# Patient Record
Sex: Female | Born: 1960 | Race: White | Hispanic: No | Smoking: Current every day smoker
Health system: Southern US, Community
[De-identification: ages and names within clinical notes are randomized; demographics above are authoritative.]

## PROBLEM LIST (undated history)

## (undated) DIAGNOSIS — F633 Trichotillomania: Secondary | ICD-10-CM

## (undated) DIAGNOSIS — E119 Type 2 diabetes mellitus without complications: Secondary | ICD-10-CM

## (undated) DIAGNOSIS — J189 Pneumonia, unspecified organism: Secondary | ICD-10-CM

## (undated) DIAGNOSIS — M1990 Unspecified osteoarthritis, unspecified site: Secondary | ICD-10-CM

## (undated) HISTORY — PX: HIP OPEN REDUCTION: SHX1755

## (undated) HISTORY — PX: ORIF WRIST FRACTURE: SHX2133

## (undated) HISTORY — PX: APPENDECTOMY: SHX54

## (undated) HISTORY — PX: KNEE ARTHROSCOPY: SUR90

## (undated) HISTORY — PX: HAND SURGERY: SHX662

---
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
## 1995-12-25 HISTORY — PX: TUBAL LIGATION: SHX77

## 1997-08-15 ENCOUNTER — Other Ambulatory Visit: Admission: RE | Admit: 1997-08-15 | Discharge: 1997-08-15 | Payer: Self-pay | Admitting: Obstetrics & Gynecology

## 1998-09-20 ENCOUNTER — Observation Stay (HOSPITAL_COMMUNITY): Admission: AD | Admit: 1998-09-20 | Discharge: 1998-09-21 | Payer: Self-pay | Admitting: Obstetrics and Gynecology

## 1998-10-10 ENCOUNTER — Other Ambulatory Visit: Admission: RE | Admit: 1998-10-10 | Discharge: 1998-10-10 | Payer: Self-pay | Admitting: Obstetrics & Gynecology

## 1999-10-18 ENCOUNTER — Other Ambulatory Visit: Admission: RE | Admit: 1999-10-18 | Discharge: 1999-10-18 | Payer: Self-pay | Admitting: Obstetrics & Gynecology

## 2000-11-04 ENCOUNTER — Other Ambulatory Visit: Admission: RE | Admit: 2000-11-04 | Discharge: 2000-11-04 | Payer: Self-pay | Admitting: Obstetrics & Gynecology

## 2001-09-24 ENCOUNTER — Encounter: Payer: Self-pay | Admitting: Obstetrics & Gynecology

## 2001-09-24 ENCOUNTER — Encounter: Admission: RE | Admit: 2001-09-24 | Discharge: 2001-09-24 | Payer: Self-pay | Admitting: Obstetrics & Gynecology

## 2001-11-16 ENCOUNTER — Other Ambulatory Visit: Admission: RE | Admit: 2001-11-16 | Discharge: 2001-11-16 | Payer: Self-pay | Admitting: Obstetrics & Gynecology

## 2002-11-04 ENCOUNTER — Other Ambulatory Visit: Admission: RE | Admit: 2002-11-04 | Discharge: 2002-11-04 | Payer: Self-pay | Admitting: Obstetrics & Gynecology

## 2003-12-04 ENCOUNTER — Other Ambulatory Visit: Admission: RE | Admit: 2003-12-04 | Discharge: 2003-12-04 | Payer: Self-pay | Admitting: Obstetrics & Gynecology

## 2005-05-14 ENCOUNTER — Other Ambulatory Visit: Admission: RE | Admit: 2005-05-14 | Discharge: 2005-05-14 | Payer: Self-pay | Admitting: Obstetrics & Gynecology

## 2014-04-14 ENCOUNTER — Ambulatory Visit: Payer: Self-pay | Admitting: Orthopedic Surgery

## 2014-04-24 ENCOUNTER — Ambulatory Visit: Payer: Self-pay | Admitting: Orthopedic Surgery

## 2014-04-24 NOTE — H&P (Signed)
[name] is an 53 y.o. female.   Chief Complaint: back and left leg pain HPI: The patient is a 53 year old female who presents today for follow up of their back. The patient is being followed for their low back and left hip pain. They are now 6 month(s) out from a flare up. Symptoms reported today include: pain. The patient states that they are doing poorly. Current treatment includes: activity modification, NSAIDs and pain medications. The following medication has been used for pain control: Noroco, Neurontin and Aleve. The patient presents today following MRI. The patient indicates that they have questions or concerns today regarding pain. The patient reports she fell yesteday and now has increased back pain and ankle pain.  No past medical history on file.  No past surgical history on file.  No family history on file. Social History:  has no tobacco, alcohol, and drug history on file.  Allergies: Allergies not on file   (Not in a hospital admission)  No results found for this or any previous visit (from the past 48 hour(s)). No results found.  Review of Systems  Constitutional: Negative.   HENT: Negative.   Eyes: Negative.   Respiratory: Negative.   Cardiovascular: Negative.   Gastrointestinal: Negative.   Genitourinary: Negative.   Musculoskeletal: Positive for back pain and joint pain.  Skin: Negative.   Neurological: Positive for focal weakness.    There were no vitals taken for this visit. Physical Exam  Constitutional: She is oriented to person, place, and time. She appears well-developed and well-nourished.  HENT:  Head: Normocephalic and atraumatic.  Eyes: Conjunctivae and EOM are normal. Pupils are equal, round, and reactive to light.  Neck: Normal range of motion. Neck supple.  Cardiovascular: Normal rate and regular rhythm.   Respiratory: Effort normal and breath sounds normal.  GI: Soft. Bowel sounds are normal.  Musculoskeletal:  On exam straight leg  raise produces buttock, thigh, and calf pain on the left, negative on the right. Trace EHL weakness. Pain with extension and flexion of the lumbar spine.  Lumbar spine exam reveals no evidence of soft tissue swelling, no evidence of soft tissue swelling or deformity or skin ecchymosis. On palpation there is no tenderness of the lumbar spine. No flank pain with percussion. The abdomen is soft and nontender. Nontender over the trochanters. No cellulitis or lymphadenopathy.  Motor is 5/5 including tibialis anterior, plantarflexion, quadriceps and hamstrings. The patient is normoreflexic. There is no Babinski or clonus. Sensory exam is intact to light touch. The patient has good distal pulses. No DVT. No pain and normal range of motion without instability of the hips, and knees.  She was recently walking and twisted her ankle. She has tenderness over the anterior lateral talofibular ligament. Negative anterior drawer.  Neurological: She is alert and oriented to person, place, and time. She has normal reflexes.  Skin: Skin is warm and dry.     She does have a disc herniation L5-S1 with root displaced. A small disc protrusion at 4-5. It doe radiate into the outer aspect of the foot, the S1 dermatome.  Assessment/Plan HNP L5-S1 left 1. 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 2. Grade II ankle stain.  Plan 1. Rest, ice, and elevation for the ankle, ASO. 2. For her lumbar spine we discussed lumbar decompression versus epidural. She is diabetic and is better controlled although she had injections in the hip and it elevated her blood glucose. 3. We  discussed selective nerve root block, epidural at 5-1 versus lumbar decompression.  I had an extensive discussion of the risks and benefits of lumbar decompression with the patient including bleeding, infection, damage to neurovascular structures, epidural fibrosis, CSF leakage  requiring repair. We also discussed increase in pain, adjacent segment disease, recurrent disc herniation, need for future surgery including repeat decompression and/or fusion. We also discussed risks of postoperative hematoma, paralysis, anesthetic complications including DVT, PE, death, cardiopulmonary dysfunction. In addition, the perioperative and postoperative courses were discussed in detail including the rehabilitative time and return to functional activity and work. I provided the patient with an illustrated handout and utilized the appropriate surgical models.  4. She would like to contemplate that at this point in time. The patient called back later that day. She wants to forego the injection and proceed with lumbar decompression. We will call her and discuss the lumbar decompression.  She had called back and talked it over with her mother and is now declining the epidural injection due to diabetes and probability of temporary relief. We did discuss lumbar decompression and the possibility of CSF leakage, recurrent disc herniation and needing fusion in vitro overnight postoperative course, and time for recovery of the nerve. However talked to [place] and will schedule accordingly.  I had an extensive discussion of the risks and benefits of the lumbar decompression with the patient including bleeding, infection, damage to neurovascular structures, epidural fibrosis, CSF leak requiring repair. We also discussed increase in pain, adjacent segment disease, recurrent disc herniation, need for future surgery including repeat decompression and/or fusion. We also discussed risks of postoperative hematoma, paralysis, anesthetic complications including DVT, PE, death, cardiopulmonary dysfunction. In addition, the perioperative and postoperative courses were discussed in detail including the rehabilitative time and return to functional activity and work. I provided the patient with an illustrated handout  and utilized the appropriate surgical models.  Plan microlumbar decompression L5-S1 left  [name], [name] PA-C for Dr. Tonita Cong 04/24/2014, 9:24 AM

## 2014-04-28 HISTORY — PX: BACK SURGERY: SHX140

## 2014-05-04 ENCOUNTER — Encounter (HOSPITAL_COMMUNITY): Payer: Self-pay

## 2014-05-04 ENCOUNTER — Ambulatory Visit (HOSPITAL_COMMUNITY)
Admission: RE | Admit: 2014-05-04 | Discharge: 2014-05-04 | Disposition: A | Discharge status: Home / Self Care | Payer: BC Managed Care – PPO | Source: Ambulatory Visit | Attending: Orthopedic Surgery | Admitting: Orthopedic Surgery

## 2014-05-04 ENCOUNTER — Encounter (HOSPITAL_COMMUNITY)
Admission: RE | Admit: 2014-05-04 | Discharge: 2014-05-04 | Disposition: A | Discharge status: Home / Self Care | Payer: BC Managed Care – PPO | Source: Ambulatory Visit | Attending: Specialist | Admitting: Specialist

## 2014-05-04 DIAGNOSIS — Z01818 Encounter for other preprocedural examination: Secondary | ICD-10-CM | POA: Diagnosis not present

## 2014-05-04 DIAGNOSIS — M5136 Other intervertebral disc degeneration, lumbar region: Secondary | ICD-10-CM | POA: Insufficient documentation

## 2014-05-04 DIAGNOSIS — E119 Type 2 diabetes mellitus without complications: Secondary | ICD-10-CM | POA: Diagnosis not present

## 2014-05-04 DIAGNOSIS — M5126 Other intervertebral disc displacement, lumbar region: Secondary | ICD-10-CM

## 2014-05-04 HISTORY — DX: Type 2 diabetes mellitus without complications: E11.9

## 2014-05-04 HISTORY — DX: Trichotillomania: F63.3

## 2014-05-04 HISTORY — DX: Unspecified osteoarthritis, unspecified site: M19.90

## 2014-05-04 LAB — BASIC METABOLIC PANEL
Anion gap: 3 — ABNORMAL LOW (ref 5–15)
BUN: 12 mg/dL (ref 6–23)
CO2: 27 mmol/L (ref 19–32)
CREATININE: 0.59 mg/dL (ref 0.50–1.10)
Calcium: 9 mg/dL (ref 8.4–10.5)
Chloride: 106 mEq/L (ref 96–112)
Glucose, Bld: 126 mg/dL — ABNORMAL HIGH (ref 70–99)
POTASSIUM: 3.6 mmol/L (ref 3.5–5.1)
Sodium: 136 mmol/L (ref 135–145)

## 2014-05-04 LAB — SURGICAL PCR SCREEN
MRSA, PCR: NEGATIVE
Staphylococcus aureus: NEGATIVE

## 2014-05-04 LAB — CBC
HEMATOCRIT: 43.4 % (ref 36.0–46.0)
HEMOGLOBIN: 14.3 g/dL (ref 12.0–15.0)
MCH: 32 pg (ref 26.0–34.0)
MCHC: 32.9 g/dL (ref 30.0–36.0)
MCV: 97.1 fL (ref 78.0–100.0)
PLATELETS: 205 10*3/uL (ref 150–400)
RBC: 4.47 MIL/uL (ref 3.87–5.11)
RDW: 13.4 % (ref 11.5–15.5)
WBC: 7.1 10*3/uL (ref 4.0–10.5)

## 2014-05-04 NOTE — Pre-Procedure Instructions (Addendum)
05-04-14 EKG done. Lumbar spine done per order. Dr. Delma Post, anesthesiologist  in to see to discuss pt concerns on glucose control during surgery. Phentermine use last 05-02-14- pt. Aware to not take for now.

## 2014-05-04 NOTE — Pre-Procedure Instructions (Signed)
Ms. [name] takes Toujeo in the morning. I discussed management on the day of surgery. She will take one half her usual AM dose on the day of surgery.

## 2014-05-04 NOTE — Patient Instructions (Addendum)
[place]  05/04/2014   Your procedure is scheduled on:   -05-10-2014  Enter through [facility]  Entrance and follow signs to [facility]. Arrive at     0630   AM .  Call this number if you have problems the morning of surgery: [phone]  Or Presurgical Testing [phone].   For Living Will and/or Health Care Power Attorney Forms: please provide copy for your medical record,may bring AM of surgery(Forms should be already notarized -we do not provide this service).(05-04-14 Yes/ No information preferred today).      Do not eat food/ or drink: After Midnight.      Take these medicines the morning of surgery with A SIP OF WATER: Take Toujeo insulin (1/2 usual AM dose) morning of and no other Diabetic meds.  Take usual PM meds night before.   Do not wear jewelry, make-up or nail polish.  Do not wear deodorant, lotions, powders, or perfumes.   Do not shave legs and under arms- 48 hours(2 days) prior to first CHG shower.(Shaving face and neck okay.)  Do not bring valuables to the hospital.(Hospital is not responsible for lost valuables).  Contacts, dentures or removable bridgework, body piercing, hair pins may not be worn into surgery.  Leave suitcase in the car. After surgery it may be brought to your room.  For patients admitted to the hospital, checkout time is 11:00 AM the day of discharge.(Restricted visitors-Any Persons displaying flu-like symptoms or illness).    Patients discharged the day of surgery will not be allowed to drive home. Must have responsible person with you x 24 hours once discharged.  Name and phone number of your driver: [name] , mother [phone]     Please read over the following fact sheets that you were given:  CHG(Chlorhexidine Gluconate 4% Surgical Soap) use, MRSA Information,  Incentive Spirometry Instruction.           [name] - Preparing for Surgery Before surgery, you can play an important role.  Because skin is not sterile,  your skin needs to be as free of germs as possible.  You can reduce the number of germs on your skin by washing with CHG (chlorahexidine gluconate) soap before surgery.  CHG is an antiseptic cleaner which kills germs and bonds with the skin to continue killing germs even after washing. Please DO NOT use if you have an allergy to CHG or antibacterial soaps.  If your skin becomes reddened/irritated stop using the CHG and inform your nurse when you arrive at Short Stay. Do not shave (including legs and underarms) for at least 48 hours prior to the first CHG shower.  You may shave your face/neck. Please follow these instructions carefully:  1.  Shower with CHG Soap the night before surgery and the  morning of Surgery.  2.  If you choose to wash your hair, wash your hair first as usual with your  normal  shampoo.  3.  After you shampoo, rinse your hair and body thoroughly to remove the  shampoo.                           4.  Use CHG as you would any other liquid soap.  You can apply chg directly  to the skin and wash                       Gently with a  scrungie or clean washcloth.  5.  Apply the CHG Soap to your body ONLY FROM THE NECK DOWN.   Do not use on face/ open                           Wound or open sores. Avoid contact with eyes, ears mouth and genitals (private parts).                       Wash face,  Genitals (private parts) with your normal soap.             6.  Wash thoroughly, paying special attention to the area where your surgery  will be performed.  7.  Thoroughly rinse your body with warm water from the neck down.  8.  DO NOT shower/wash with your normal soap after using and rinsing off  the CHG Soap.                9.  Pat yourself dry with a clean towel.            10.  Wear clean pajamas.            11.  Place clean sheets on your bed the night of your first shower and do not  sleep with pets. Day of Surgery : Do not apply any lotions/deodorants the morning of surgery.  Please wear  clean clothes to the hospital/surgery center.  FAILURE TO FOLLOW THESE INSTRUCTIONS MAY RESULT IN THE CANCELLATION OF YOUR SURGERY PATIENT SIGNATURE_________________________________  NURSE SIGNATURE__________________________________  ________________________________________________________________________   [name]  An incentive spirometer is a tool that can help keep your lungs clear and active. This tool measures how well you are filling your lungs with each breath. Taking long deep breaths may help reverse or decrease the chance of developing breathing (pulmonary) problems (especially infection) following:  A long period of time when you are unable to move or be active. BEFORE THE PROCEDURE   If the spirometer includes an indicator to show your best effort, your nurse or respiratory therapist will set it to a desired goal.  If possible, sit up straight or lean slightly forward. Try not to slouch.  Hold the incentive spirometer in an upright position. INSTRUCTIONS FOR USE  1. Sit on the edge of your bed if possible, or sit up as far as you can in bed or on a chair. 2. Hold the incentive spirometer in an upright position. 3. Breathe out normally. 4. Place the mouthpiece in your mouth and seal your lips tightly around it. 5. Breathe in slowly and as deeply as possible, raising the piston or the ball toward the top of the column. 6. Hold your breath for 3-5 seconds or for as long as possible. Allow the piston or ball to fall to the bottom of the column. 7. Remove the mouthpiece from your mouth and breathe out normally. 8. Rest for a few seconds and repeat Steps 1 through 7 at least 10 times every 1-2 hours when you are awake. Take your time and take a few normal breaths between deep breaths. 9. The spirometer may include an indicator to show your best effort. Use the indicator as a goal to work toward during each repetition. 10. After each set of 10 deep breaths, practice  coughing to be sure your lungs are clear. If you have an incision (the cut made at the time of surgery), support your  incision when coughing by placing a pillow or rolled up towels firmly against it. Once you are able to get out of bed, walk around indoors and cough well. You may stop using the incentive spirometer when instructed by your caregiver.  RISKS AND COMPLICATIONS  Take your time so you do not get dizzy or light-headed.  If you are in pain, you may need to take or ask for pain medication before doing incentive spirometry. It is harder to take a deep breath if you are having pain. AFTER USE  Rest and breathe slowly and easily.  It can be helpful to keep track of a log of your progress. Your caregiver can provide you with a simple table to help with this. If you are using the spirometer at home, follow these instructions: [place] IF:   You are having difficultly using the spirometer.  You have trouble using the spirometer as often as instructed.  Your pain medication is not giving enough relief while using the spirometer.  You develop fever of 100.5 F (38.1 C) or higher. SEEK IMMEDIATE MEDICAL CARE IF:   You cough up bloody sputum that had not been present before.  You develop fever of 102 F (38.9 C) or greater.  You develop worsening pain at or near the incision site. MAKE SURE YOU:   Understand these instructions.  Will watch your condition.  Will get help right away if you are not doing well or get worse. Document Released: 08/25/2006 Document Revised: 07/07/2011 Document Reviewed: 10/26/2006 [facility] Patient Information 2014 [place], [place].   ________________________________________________________________________

## 2014-05-10 ENCOUNTER — Ambulatory Visit (HOSPITAL_COMMUNITY)
Admission: RE | Admit: 2014-05-10 | Discharge: 2014-05-10 | Disposition: A | Discharge status: Home / Self Care | Payer: BC Managed Care – PPO | Source: Ambulatory Visit | Attending: Specialist | Admitting: Specialist

## 2014-05-10 ENCOUNTER — Ambulatory Visit (HOSPITAL_COMMUNITY): Payer: BC Managed Care – PPO

## 2014-05-10 ENCOUNTER — Ambulatory Visit (HOSPITAL_COMMUNITY): Payer: BC Managed Care – PPO | Admitting: Anesthesiology

## 2014-05-10 ENCOUNTER — Encounter (HOSPITAL_COMMUNITY)
Admission: RE | Disposition: A | Discharge status: Home / Self Care | Payer: Self-pay | Source: Ambulatory Visit | Attending: Specialist

## 2014-05-10 ENCOUNTER — Encounter (HOSPITAL_COMMUNITY): Payer: Self-pay | Admitting: *Deleted

## 2014-05-10 DIAGNOSIS — Z794 Long term (current) use of insulin: Secondary | ICD-10-CM | POA: Insufficient documentation

## 2014-05-10 DIAGNOSIS — Z419 Encounter for procedure for purposes other than remedying health state, unspecified: Secondary | ICD-10-CM

## 2014-05-10 DIAGNOSIS — M5126 Other intervertebral disc displacement, lumbar region: Secondary | ICD-10-CM

## 2014-05-10 DIAGNOSIS — E119 Type 2 diabetes mellitus without complications: Secondary | ICD-10-CM | POA: Diagnosis not present

## 2014-05-10 DIAGNOSIS — M4690 Unspecified inflammatory spondylopathy, site unspecified: Secondary | ICD-10-CM | POA: Insufficient documentation

## 2014-05-10 DIAGNOSIS — Z888 Allergy status to other drugs, medicaments and biological substances status: Secondary | ICD-10-CM | POA: Insufficient documentation

## 2014-05-10 DIAGNOSIS — F17210 Nicotine dependence, cigarettes, uncomplicated: Secondary | ICD-10-CM | POA: Diagnosis not present

## 2014-05-10 HISTORY — PX: LUMBAR LAMINECTOMY/DECOMPRESSION MICRODISCECTOMY: SHX5026

## 2014-05-10 HISTORY — DX: Other intervertebral disc displacement, lumbar region: M51.26

## 2014-05-10 LAB — GLUCOSE, CAPILLARY
Glucose-Capillary: 111 mg/dL — ABNORMAL HIGH (ref 70–99)
Glucose-Capillary: 140 mg/dL — ABNORMAL HIGH (ref 70–99)
Glucose-Capillary: 198 mg/dL — ABNORMAL HIGH (ref 70–99)

## 2014-05-10 SURGERY — LUMBAR LAMINECTOMY/DECOMPRESSION MICRODISCECTOMY
Anesthesia: General | Site: Back | Laterality: Left

## 2014-05-10 MED ORDER — MENTHOL 3 MG MT LOZG: 1.0000 | LOZENGE | OROMUCOSAL | Status: DC | PRN

## 2014-05-10 MED ORDER — METHOCARBAMOL 500 MG PO TABS: 500.0000 mg | ORAL_TABLET | Freq: Three times a day (TID) | ORAL | Status: DC

## 2014-05-10 MED ORDER — MIDAZOLAM HCL 2 MG/2ML IJ SOLN
INTRAMUSCULAR | Status: AC
  Filled 2014-05-10: qty 2

## 2014-05-10 MED ORDER — ALUM & MAG HYDROXIDE-SIMETH 200-200-20 MG/5ML PO SUSP: 30.0000 mL | Freq: Four times a day (QID) | ORAL | Status: DC | PRN

## 2014-05-10 MED ORDER — INSULIN GLARGINE 100 UNIT/ML ~~LOC~~ SOLN: 30.0000 [IU] | Freq: Every morning | SUBCUTANEOUS | Status: DC

## 2014-05-10 MED ORDER — PROPOFOL 10 MG/ML IV BOLUS
INTRAVENOUS | Status: DC | PRN
  Administered 2014-05-10: 90 mg via INTRAVENOUS

## 2014-05-10 MED ORDER — BUPIVACAINE-EPINEPHRINE (PF) 0.5% -1:200000 IJ SOLN
INTRAMUSCULAR | Status: AC
  Filled 2014-05-10: qty 30

## 2014-05-10 MED ORDER — CEFAZOLIN SODIUM-DEXTROSE 2-3 GM-% IV SOLR
INTRAVENOUS | Status: AC
  Filled 2014-05-10: qty 50

## 2014-05-10 MED ORDER — SENNOSIDES-DOCUSATE SODIUM 8.6-50 MG PO TABS: 1.0000 | ORAL_TABLET | Freq: Every evening | ORAL | Status: DC | PRN

## 2014-05-10 MED ORDER — GLYCOPYRROLATE 0.2 MG/ML IJ SOLN
INTRAMUSCULAR | Status: DC | PRN
  Administered 2014-05-10: 0.4 mg via INTRAVENOUS

## 2014-05-10 MED ORDER — PHENYLEPHRINE HCL 10 MG/ML IJ SOLN
10.0000 mg | INTRAMUSCULAR | Status: DC | PRN
  Administered 2014-05-10: 25 ug/min via INTRAVENOUS

## 2014-05-10 MED ORDER — BUPIVACAINE-EPINEPHRINE 0.5% -1:200000 IJ SOLN
INTRAMUSCULAR | Status: DC | PRN
  Administered 2014-05-10: 8 mL

## 2014-05-10 MED ORDER — KCL IN DEXTROSE-NACL 20-5-0.45 MEQ/L-%-% IV SOLN
INTRAVENOUS | Status: DC
  Filled 2014-05-10: qty 1000

## 2014-05-10 MED ORDER — DEXAMETHASONE SODIUM PHOSPHATE 10 MG/ML IJ SOLN
INTRAMUSCULAR | Status: AC
  Filled 2014-05-10: qty 1

## 2014-05-10 MED ORDER — ONDANSETRON HCL 4 MG/2ML IJ SOLN
INTRAMUSCULAR | Status: DC | PRN
  Administered 2014-05-10: 4 mg via INTRAVENOUS

## 2014-05-10 MED ORDER — PROMETHAZINE HCL 25 MG/ML IJ SOLN: 6.2500 mg | INTRAMUSCULAR | Status: DC | PRN

## 2014-05-10 MED ORDER — OXYCODONE-ACETAMINOPHEN 5-325 MG PO TABS: 1.0000 | ORAL_TABLET | ORAL | Status: DC | PRN

## 2014-05-10 MED ORDER — LIDOCAINE HCL (CARDIAC) 20 MG/ML IV SOLN
INTRAVENOUS | Status: AC
  Filled 2014-05-10: qty 5

## 2014-05-10 MED ORDER — CEFAZOLIN SODIUM-DEXTROSE 2-3 GM-% IV SOLR
2.0000 g | INTRAVENOUS | Status: AC
  Administered 2014-05-10: 2 g via INTRAVENOUS

## 2014-05-10 MED ORDER — SENNA 8.6 MG PO TABS: 1.0000 | ORAL_TABLET | Freq: Every day | ORAL | Status: DC

## 2014-05-10 MED ORDER — HYDROCODONE-ACETAMINOPHEN 5-325 MG PO TABS: 1.0000 | ORAL_TABLET | ORAL | Status: DC | PRN

## 2014-05-10 MED ORDER — DOCUSATE SODIUM 100 MG PO CAPS: 100.0000 mg | ORAL_CAPSULE | Freq: Two times a day (BID) | ORAL | Status: DC

## 2014-05-10 MED ORDER — ROCURONIUM BROMIDE 100 MG/10ML IV SOLN
INTRAVENOUS | Status: AC
  Filled 2014-05-10: qty 1

## 2014-05-10 MED ORDER — ESTRADIOL 1 MG PO TABS
1.0000 mg | ORAL_TABLET | Freq: Every day | ORAL | Status: DC
  Filled 2014-05-10: qty 1

## 2014-05-10 MED ORDER — SUCCINYLCHOLINE CHLORIDE 20 MG/ML IJ SOLN
INTRAMUSCULAR | Status: DC | PRN
  Administered 2014-05-10: 100 mg via INTRAVENOUS

## 2014-05-10 MED ORDER — ONDANSETRON HCL 4 MG/2ML IJ SOLN
INTRAMUSCULAR | Status: AC
  Filled 2014-05-10: qty 2

## 2014-05-10 MED ORDER — NEOSTIGMINE METHYLSULFATE 10 MG/10ML IV SOLN
INTRAVENOUS | Status: DC | PRN
  Administered 2014-05-10: 3 mg via INTRAVENOUS

## 2014-05-10 MED ORDER — METHOCARBAMOL 1000 MG/10ML IJ SOLN
500.0000 mg | Freq: Four times a day (QID) | INTRAMUSCULAR | Status: DC | PRN
  Filled 2014-05-10: qty 5

## 2014-05-10 MED ORDER — HYDROMORPHONE HCL 1 MG/ML IJ SOLN: 0.5000 mg | INTRAMUSCULAR | Status: DC | PRN

## 2014-05-10 MED ORDER — INSULIN ASPART 100 UNIT/ML ~~LOC~~ SOLN: 3.0000 [IU] | Freq: Three times a day (TID) | SUBCUTANEOUS | Status: DC

## 2014-05-10 MED ORDER — MIDAZOLAM HCL 5 MG/5ML IJ SOLN
INTRAMUSCULAR | Status: DC | PRN
  Administered 2014-05-10: 2 mg via INTRAVENOUS

## 2014-05-10 MED ORDER — DEXAMETHASONE SODIUM PHOSPHATE 10 MG/ML IJ SOLN
INTRAMUSCULAR | Status: DC | PRN
  Administered 2014-05-10: 10 mg via INTRAVENOUS

## 2014-05-10 MED ORDER — MAGNESIUM CITRATE PO SOLN: 1.0000 | Freq: Once | ORAL | Status: DC | PRN

## 2014-05-10 MED ORDER — LACTATED RINGERS IV SOLN: INTRAVENOUS | Status: DC

## 2014-05-10 MED ORDER — SODIUM CHLORIDE 0.9 % IJ SOLN: 3.0000 mL | Freq: Two times a day (BID) | INTRAMUSCULAR | Status: DC

## 2014-05-10 MED ORDER — THROMBIN 5000 UNITS EX SOLR
CUTANEOUS | Status: AC
  Filled 2014-05-10: qty 10000

## 2014-05-10 MED ORDER — LACTATED RINGERS IV SOLN
INTRAVENOUS | Status: DC
  Administered 2014-05-10: 08:00:00 via INTRAVENOUS

## 2014-05-10 MED ORDER — NEOSTIGMINE METHYLSULFATE 10 MG/10ML IV SOLN
INTRAVENOUS | Status: AC
  Filled 2014-05-10: qty 1

## 2014-05-10 MED ORDER — ONDANSETRON HCL 4 MG/2ML IJ SOLN: 4.0000 mg | INTRAMUSCULAR | Status: DC | PRN

## 2014-05-10 MED ORDER — METHOCARBAMOL 500 MG PO TABS: 500.0000 mg | ORAL_TABLET | Freq: Four times a day (QID) | ORAL | Status: DC | PRN

## 2014-05-10 MED ORDER — RISAQUAD PO CAPS
1.0000 | ORAL_CAPSULE | Freq: Every day | ORAL | Status: DC
  Filled 2014-05-10: qty 1

## 2014-05-10 MED ORDER — POLYMYXIN B SULFATE 500000 UNITS IJ SOLR
INTRAMUSCULAR | Status: DC | PRN
  Administered 2014-05-10: 500 mL

## 2014-05-10 MED ORDER — GABAPENTIN 300 MG PO CAPS
300.0000 mg | ORAL_CAPSULE | Freq: Three times a day (TID) | ORAL | Status: DC
  Filled 2014-05-10: qty 1

## 2014-05-10 MED ORDER — POLYMYXIN B SULFATE 500000 UNITS IJ SOLR
INTRAMUSCULAR | Status: AC
  Filled 2014-05-10: qty 1

## 2014-05-10 MED ORDER — DOCUSATE SODIUM 100 MG PO CAPS: 100.0000 mg | ORAL_CAPSULE | Freq: Two times a day (BID) | ORAL | Status: DC | PRN

## 2014-05-10 MED ORDER — FENTANYL CITRATE 0.05 MG/ML IJ SOLN
INTRAMUSCULAR | Status: AC
  Filled 2014-05-10: qty 5

## 2014-05-10 MED ORDER — PHENYLEPHRINE HCL 10 MG/ML IJ SOLN
INTRAMUSCULAR | Status: AC
  Filled 2014-05-10: qty 1

## 2014-05-10 MED ORDER — SODIUM CHLORIDE 0.9 % IV SOLN: 250.0000 mL | INTRAVENOUS | Status: DC

## 2014-05-10 MED ORDER — ACETAMINOPHEN 650 MG RE SUPP: 650.0000 mg | RECTAL | Status: DC | PRN

## 2014-05-10 MED ORDER — MEPERIDINE HCL 50 MG/ML IJ SOLN: 6.2500 mg | INTRAMUSCULAR | Status: DC | PRN

## 2014-05-10 MED ORDER — ACETAMINOPHEN 325 MG PO TABS: 650.0000 mg | ORAL_TABLET | ORAL | Status: DC | PRN

## 2014-05-10 MED ORDER — PROPOFOL 10 MG/ML IV BOLUS
INTRAVENOUS | Status: AC
  Filled 2014-05-10: qty 20

## 2014-05-10 MED ORDER — LIDOCAINE HCL (CARDIAC) 20 MG/ML IV SOLN
INTRAVENOUS | Status: DC | PRN
  Administered 2014-05-10: 100 mg via INTRAVENOUS

## 2014-05-10 MED ORDER — PHENOL 1.4 % MT LIQD: 1.0000 | OROMUCOSAL | Status: DC | PRN

## 2014-05-10 MED ORDER — BISACODYL 5 MG PO TBEC: 5.0000 mg | DELAYED_RELEASE_TABLET | Freq: Every day | ORAL | Status: DC | PRN

## 2014-05-10 MED ORDER — PHENYLEPHRINE HCL 10 MG/ML IJ SOLN
INTRAMUSCULAR | Status: DC | PRN
  Administered 2014-05-10 (×2): 80 ug via INTRAVENOUS

## 2014-05-10 MED ORDER — INSULIN ASPART 100 UNIT/ML ~~LOC~~ SOLN: 0.0000 [IU] | Freq: Three times a day (TID) | SUBCUTANEOUS | Status: DC

## 2014-05-10 MED ORDER — EPHEDRINE SULFATE 50 MG/ML IJ SOLN
INTRAMUSCULAR | Status: DC | PRN
  Administered 2014-05-10: 5 mg via INTRAVENOUS
  Administered 2014-05-10: 10 mg via INTRAVENOUS

## 2014-05-10 MED ORDER — THROMBIN 5000 UNITS EX SOLR
CUTANEOUS | Status: DC | PRN
  Administered 2014-05-10: 5000 [IU] via TOPICAL

## 2014-05-10 MED ORDER — ROCURONIUM BROMIDE 100 MG/10ML IV SOLN
INTRAVENOUS | Status: DC | PRN
  Administered 2014-05-10: 30 mg via INTRAVENOUS

## 2014-05-10 MED ORDER — HYDROMORPHONE HCL 1 MG/ML IJ SOLN: 0.2500 mg | INTRAMUSCULAR | Status: DC | PRN

## 2014-05-10 MED ORDER — FLUOXETINE HCL 20 MG PO CAPS
40.0000 mg | ORAL_CAPSULE | Freq: Every day | ORAL | Status: DC
  Filled 2014-05-10: qty 2

## 2014-05-10 MED ORDER — FENTANYL CITRATE 0.05 MG/ML IJ SOLN
INTRAMUSCULAR | Status: DC | PRN
  Administered 2014-05-10 (×4): 25 ug via INTRAVENOUS
  Administered 2014-05-10 (×3): 50 ug via INTRAVENOUS

## 2014-05-10 MED ORDER — PHENTERMINE HCL 37.5 MG PO TABS: 37.5000 mg | ORAL_TABLET | Freq: Every day | ORAL | Status: DC

## 2014-05-10 MED ORDER — SODIUM CHLORIDE 0.9 % IJ SOLN: 3.0000 mL | INTRAMUSCULAR | Status: DC | PRN

## 2014-05-10 MED ORDER — PROGESTERONE MICRONIZED 100 MG PO CAPS
100.0000 mg | ORAL_CAPSULE | Freq: Every day | ORAL | Status: DC
  Filled 2014-05-10: qty 1

## 2014-05-10 MED ORDER — PHENYLEPHRINE 40 MCG/ML (10ML) SYRINGE FOR IV PUSH (FOR BLOOD PRESSURE SUPPORT)
PREFILLED_SYRINGE | INTRAVENOUS | Status: AC
  Filled 2014-05-10: qty 10

## 2014-05-10 MED ORDER — CEFAZOLIN SODIUM-DEXTROSE 2-3 GM-% IV SOLR
2.0000 g | Freq: Three times a day (TID) | INTRAVENOUS | Status: DC
  Administered 2014-05-10: 2 g via INTRAVENOUS
  Filled 2014-05-10 (×2): qty 50

## 2014-05-10 MED ORDER — GLYCOPYRROLATE 0.2 MG/ML IJ SOLN
INTRAMUSCULAR | Status: AC
  Filled 2014-05-10: qty 3

## 2014-05-10 SURGICAL SUPPLY — 43 items
BAG SPEC THK2 15X12 ZIP CLS (MISCELLANEOUS)
BAG ZIPLOCK 12X15 (MISCELLANEOUS) IMPLANT
CLEANER TIP ELECTROSURG 2X2 (MISCELLANEOUS) ×2 IMPLANT
CLOTH 2% CHLOROHEXIDINE 3PK (PERSONAL CARE ITEMS) ×2 IMPLANT
DRAPE MICROSCOPE LEICA (MISCELLANEOUS) ×2 IMPLANT
DRAPE POUCH INSTRU U-SHP 10X18 (DRAPES) ×2 IMPLANT
DRAPE SURG 17X11 SM STRL (DRAPES) ×2 IMPLANT
DRAPE UTILITY XL STRL (DRAPES) ×2 IMPLANT
DRSG AQUACEL AG ADV 3.5X 4 (GAUZE/BANDAGES/DRESSINGS) ×1 IMPLANT
DRSG AQUACEL AG ADV 3.5X 6 (GAUZE/BANDAGES/DRESSINGS) IMPLANT
DURAPREP 26ML APPLICATOR (WOUND CARE) ×2 IMPLANT
DURASEAL SPINE SEALANT 3ML (MISCELLANEOUS) IMPLANT
ELECT BLADE TIP CTD 4 INCH (ELECTRODE) ×2 IMPLANT
ELECT REM PT RETURN 9FT ADLT (ELECTROSURGICAL) ×2
ELECTRODE REM PT RTRN 9FT ADLT (ELECTROSURGICAL) ×1 IMPLANT
GLOVE BIOGEL PI IND STRL 7.5 (GLOVE) ×1 IMPLANT
GLOVE BIOGEL PI INDICATOR 7.5 (GLOVE) ×1
GLOVE SURG SS PI 7.5 STRL IVOR (GLOVE) ×2 IMPLANT
GLOVE SURG SS PI 8.0 STRL IVOR (GLOVE) ×4 IMPLANT
GOWN STRL REUS W/TWL XL LVL3 (GOWN DISPOSABLE) ×4 IMPLANT
IV CATH 14GX2 1/4 (CATHETERS) IMPLANT
KIT BASIN OR (CUSTOM PROCEDURE TRAY) ×2 IMPLANT
KIT POSITIONING SURG ANDREWS (MISCELLANEOUS) ×2 IMPLANT
MANIFOLD NEPTUNE II (INSTRUMENTS) ×2 IMPLANT
NDL SPNL 18GX3.5 QUINCKE PK (NEEDLE) ×2 IMPLANT
NEEDLE SPNL 18GX3.5 QUINCKE PK (NEEDLE) ×4 IMPLANT
PACK LAMINECTOMY ORTHO (CUSTOM PROCEDURE TRAY) ×2 IMPLANT
PATTIES SURGICAL .5 X.5 (GAUZE/BANDAGES/DRESSINGS) IMPLANT
PATTIES SURGICAL .75X.75 (GAUZE/BANDAGES/DRESSINGS) ×2 IMPLANT
SPONGE SURGIFOAM ABS GEL 100 (HEMOSTASIS) ×2 IMPLANT
STAPLER VISISTAT (STAPLE) IMPLANT
STRIP CLOSURE SKIN 1/2X4 (GAUZE/BANDAGES/DRESSINGS) ×1 IMPLANT
SUT NURALON 4 0 TR CR/8 (SUTURE) IMPLANT
SUT PROLENE 3 0 PS 2 (SUTURE) IMPLANT
SUT VIC AB 1 CT1 27 (SUTURE)
SUT VIC AB 1 CT1 27XBRD ANTBC (SUTURE) IMPLANT
SUT VIC AB 1-0 CT2 27 (SUTURE) IMPLANT
SUT VIC AB 2-0 CT1 27 (SUTURE)
SUT VIC AB 2-0 CT1 TAPERPNT 27 (SUTURE) IMPLANT
SUT VIC AB 2-0 CT2 27 (SUTURE) IMPLANT
SYR 3ML LL SCALE MARK (SYRINGE) IMPLANT
TOWEL OR 17X26 10 PK STRL BLUE (TOWEL DISPOSABLE) ×2 IMPLANT
YANKAUER SUCT BULB TIP NO VENT (SUCTIONS) ×2 IMPLANT

## 2014-05-10 NOTE — Interval H&P Note (Signed)
History and Physical Interval Note:  05/10/2014 8:27 AM  [name]  has presented today for surgery, with the diagnosis of HNP L5-S1 LEFT   The various methods of treatment have been discussed with the patient and family. After consideration of risks, benefits and other options for treatment, the patient has consented to  Procedure(s): MICRODISCECTOMY LUMBAR DECOMPRESSION L5-S1 LEFT  (Left) as a surgical intervention .  The patient's history has been reviewed, patient examined, no change in status, stable for surgery.  I have reviewed the patient's chart and labs.  Questions were answered to the patient's satisfaction.     Raziel Koenigs C

## 2014-05-10 NOTE — Transfer of Care (Signed)
Immediate Anesthesia Transfer of Care Note  Patient: [name]  Procedure(s) Performed: Procedure(s) (LRB): MICRODISCECTOMY LUMBAR DECOMPRESSION L5-S1 LEFT  (Left)  Patient Location: PACU  Anesthesia Type: General  Level of Consciousness: sedated, patient cooperative and responds to stimulation  Airway & Oxygen Therapy: Patient Spontanous Breathing and Patient connected to face mask oxgen  Post-op Assessment: Report given to PACU RN and Post -op Vital signs reviewed and stable  Post vital signs: Reviewed and stable  Complications: No apparent anesthesia complications

## 2014-05-10 NOTE — H&P (View-Only) (Signed)
[name] is an 54 y.o. female.   Chief Complaint: back and left leg pain HPI: The patient is a 54 year old female who presents today for follow up of their back. The patient is being followed for their low back and left hip pain. They are now 6 month(s) out from a flare up. Symptoms reported today include: pain. The patient states that they are doing poorly. Current treatment includes: activity modification, NSAIDs and pain medications. The following medication has been used for pain control: Noroco, Neurontin and Aleve. The patient presents today following MRI. The patient indicates that they have questions or concerns today regarding pain. The patient reports she fell yesteday and now has increased back pain and ankle pain.  No past medical history on file.  No past surgical history on file.  No family history on file. Social History:  has no tobacco, alcohol, and drug history on file.  Allergies: Allergies not on file   (Not in a hospital admission)  No results found for this or any previous visit (from the past 48 hour(s)). No results found.  Review of Systems  Constitutional: Negative.   HENT: Negative.   Eyes: Negative.   Respiratory: Negative.   Cardiovascular: Negative.   Gastrointestinal: Negative.   Genitourinary: Negative.   Musculoskeletal: Positive for back pain and joint pain.  Skin: Negative.   Neurological: Positive for focal weakness.    There were no vitals taken for this visit. Physical Exam  Constitutional: She is oriented to person, place, and time. She appears well-developed and well-nourished.  HENT:  Head: Normocephalic and atraumatic.  Eyes: Conjunctivae and EOM are normal. Pupils are equal, round, and reactive to light.  Neck: Normal range of motion. Neck supple.  Cardiovascular: Normal rate and regular rhythm.   Respiratory: Effort normal and breath sounds normal.  GI: Soft. Bowel sounds are normal.  Musculoskeletal:  On exam straight leg  raise produces buttock, thigh, and calf pain on the left, negative on the right. Trace EHL weakness. Pain with extension and flexion of the lumbar spine.  Lumbar spine exam reveals no evidence of soft tissue swelling, no evidence of soft tissue swelling or deformity or skin ecchymosis. On palpation there is no tenderness of the lumbar spine. No flank pain with percussion. The abdomen is soft and nontender. Nontender over the trochanters. No cellulitis or lymphadenopathy.  Motor is 5/5 including tibialis anterior, plantarflexion, quadriceps and hamstrings. The patient is normoreflexic. There is no Babinski or clonus. Sensory exam is intact to light touch. The patient has good distal pulses. No DVT. No pain and normal range of motion without instability of the hips, and knees.  She was recently walking and twisted her ankle. She has tenderness over the anterior lateral talofibular ligament. Negative anterior drawer.  Neurological: She is alert and oriented to person, place, and time. She has normal reflexes.  Skin: Skin is warm and dry.     She does have a disc herniation L5-S1 with root displaced. A small disc protrusion at 4-5. It doe radiate into the outer aspect of the foot, the S1 dermatome.  Assessment/Plan HNP L5-S1 left 1. 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 2. Grade II ankle stain.  Plan 1. Rest, ice, and elevation for the ankle, ASO. 2. For her lumbar spine we discussed lumbar decompression versus epidural. She is diabetic and is better controlled although she had injections in the hip and it elevated her blood glucose. 3. We  discussed selective nerve root block, epidural at 5-1 versus lumbar decompression.  I had an extensive discussion of the risks and benefits of lumbar decompression with the patient including bleeding, infection, damage to neurovascular structures, epidural fibrosis, CSF leakage  requiring repair. We also discussed increase in pain, adjacent segment disease, recurrent disc herniation, need for future surgery including repeat decompression and/or fusion. We also discussed risks of postoperative hematoma, paralysis, anesthetic complications including DVT, PE, death, cardiopulmonary dysfunction. In addition, the perioperative and postoperative courses were discussed in detail including the rehabilitative time and return to functional activity and work. I provided the patient with an illustrated handout and utilized the appropriate surgical models.  4. She would like to contemplate that at this point in time. The patient called back later that day. She wants to forego the injection and proceed with lumbar decompression. We will call her and discuss the lumbar decompression.  She had called back and talked it over with her mother and is now declining the epidural injection due to diabetes and probability of temporary relief. We did discuss lumbar decompression and the possibility of CSF leakage, recurrent disc herniation and needing fusion in vitro overnight postoperative course, and time for recovery of the nerve. However talked to [place] and will schedule accordingly.  I had an extensive discussion of the risks and benefits of the lumbar decompression with the patient including bleeding, infection, damage to neurovascular structures, epidural fibrosis, CSF leak requiring repair. We also discussed increase in pain, adjacent segment disease, recurrent disc herniation, need for future surgery including repeat decompression and/or fusion. We also discussed risks of postoperative hematoma, paralysis, anesthetic complications including DVT, PE, death, cardiopulmonary dysfunction. In addition, the perioperative and postoperative courses were discussed in detail including the rehabilitative time and return to functional activity and work. I provided the patient with an illustrated handout  and utilized the appropriate surgical models.  Plan microlumbar decompression L5-S1 left  BISSELL, JACLYN M. PA-C for Dr. Tonita Cong 04/24/2014, 9:24 AM

## 2014-05-10 NOTE — Brief Op Note (Signed)
05/10/2014  9:46 AM  PATIENT:  [name]  54 y.o. female  PRE-OPERATIVE DIAGNOSIS:  HNP L5-S1 LEFT   POST-OPERATIVE DIAGNOSIS:  * No post-op diagnosis entered *  PROCEDURE:  Procedure(s): MICRODISCECTOMY LUMBAR DECOMPRESSION L5-S1 LEFT  (Left)  SURGEON:  Surgeon(s) and Role:    * Johnn Hai, MD - Primary  PHYSICIAN ASSISTANT:   ASSISTANTS: [place]   ANESTHESIA:   general  EBL:     BLOOD ADMINISTERED:none  DRAINS: none   LOCAL MEDICATIONS USED:  MARCAINE     SPECIMEN:  Source of Specimen:  L5S1  DISPOSITION OF SPECIMEN:  PATHOLOGY  COUNTS:  YES  TOURNIQUET:  * No tourniquets in log *  DICTATION: .Other Dictation: Dictation Number [id number]  PLAN OF CARE: Admit for overnight observation  PATIENT DISPOSITION:  PACU - hemodynamically stable.   Delay start of Pharmacological VTE agent (>24hrs) due to surgical blood loss or risk of bleeding: yes

## 2014-05-10 NOTE — Discharge Summary (Signed)
Physician Discharge Summary   Patient ID: [name] MRN: [id number] DOB/AGE: May 13, 1960 54 y.o.  Admit date: 05/10/2014 Discharge date: 05/10/2014  Primary Diagnosis:   HNP L5-S1 LEFT   Admission Diagnoses:  Past Medical History  Diagnosis Date  . Arthritis     back   . Trichotillomania     hx.   . Diabetes mellitus without complication     Type 1- injections only- recent change to [place]jeo with improved A1C reading.   Discharge Diagnoses:   Active Problems:   HNP (herniated nucleus pulposus), lumbar  Procedure:  Procedure(s) (LRB): MICRODISCECTOMY LUMBAR DECOMPRESSION L5-S1 LEFT  (Left)   Consults: None  HPI:  see H&P    Laboratory Data: No results found for any previous visit. No results for input(s): HGB in the last 72 hours. No results for input(s): WBC, RBC, HCT, PLT in the last 72 hours. No results for input(s): NA, K, CL, CO2, BUN, CREATININE, GLUCOSE, CALCIUM in the last 72 hours. No results for input(s): LABPT, INR in the last 72 hours.  X-Rays:Dg Lumbar Spine 2-3 Views  05/04/2014   CLINICAL DATA:  Preoperative for lumbar spine surgery on Wednesday.  EXAM: LUMBAR SPINE - 2-3 VIEW  COMPARISON:  MRI of lumbar spine February 26, 2013  FINDINGS: There is no evidence of lumbar spine fracture or dislocation. Alignment is normal. There are degenerative joint changes with narrowed joint space cyst at L1-2, L3-4, L4-5, L5-S1.  IMPRESSION: No acute abnormality.  Degenerative joint changes of lumbar spine.   Electronically Signed   By: Abelardo Diesel M.D.   On: 05/04/2014 16:55   Dg Spine Portable 1 View  05/10/2014   CLINICAL DATA:  For lumbar spine decompression  EXAM: PORTABLE SPINE - 1 VIEW  COMPARISON:  Preoperative lumbar spine films of 05/04/2014 and intraoperative film from today  FINDINGS: An instrument is directed posteriorly for localization of the L5-S1 interspace.  IMPRESSION: Localization of L5-S1 interspace on image #3.   Electronically Signed   By: Ivar Drape M.D.   On: 05/10/2014 09:58   Dg Spine Portable 1 View  05/10/2014   CLINICAL DATA:  Lumbar decompression  EXAM: PORTABLE SPINE - 1 VIEW  COMPARISON:  Lumbar spine films of 05/04/2014  FINDINGS: A single lateral view of the lumbar spine from the operating room shows a clamp posteriorly with the anterior portion extending toward the L5-S1 interspace.  IMPRESSION: Clamp on the inferior spinous process of L5 directed toward the L5-S1 interspace.   Electronically Signed   By: Ivar Drape M.D.   On: 05/10/2014 09:56   Dg Spine Portable 1 View  05/10/2014   CLINICAL DATA:  Lumbar decompression.  EXAM: PORTABLE SPINE - 1 VIEW  COMPARISON:  05/04/2014  FINDINGS: Posterior needles are in place, directed at the L5 spinous process and upper sacrum, approximately lower S1.  IMPRESSION: Intraoperative localization as above.   Electronically Signed   By: Rolm Baptise M.D.   On: 05/10/2014 09:13    EKG: Orders placed or performed during the hospital encounter of 05/04/14  . EKG 12-Lead  . EKG 12-Lead     Hospital Course: Patient was admitted to [facility] and taken to the OR and underwent the above state procedure without complications.  Patient tolerated the procedure well and was later transferred to the recovery room and then to the orthopaedic floor for postoperative care.  They were given PO and IV analgesics for pain control following their surgery.  They were given  postoperative antibiotics.   PT was consulted postop to assist with mobility and transfers.  The patient was allowed to be WBAT with therapy and was taught back precautions.  Patient did well on the day of surgery and started to get up OOB on same day of surgery. It was felt she was safe for D/C home. They were given discharge instructions and dressing directions.  They were instructed on when to follow up in the office with Dr. Tonita Cong.   Diet: Diabetic diet Activity:WBAT; Lspine precautions Follow-up:in 10-14 days Disposition  - Home Discharged Condition: good   Discharge Instructions    Call MD / Call 911    Complete by:  As directed   If you experience chest pain or shortness of breath, CALL 911 and be transported to the hospital emergency room.  If you develope a fever above 101 F, pus (white drainage) or increased drainage or redness at the wound, or calf pain, call your surgeon's office.     Constipation Prevention    Complete by:  As directed   Drink plenty of fluids.  Prune juice may be helpful.  You may use a stool softener, such as Colace (over the counter) 100 mg twice a day.  Use MiraLax (over the counter) for constipation as needed.     Diet - low sodium heart healthy    Complete by:  As directed      Increase activity slowly as tolerated    Complete by:  As directed             Medication List    STOP taking these medications        HYDROcodone-acetaminophen 5-325 MG per tablet  Commonly known as:  NORCO/VICODIN      TAKE these medications        atorvastatin 40 MG tablet  Commonly known as:  LIPITOR  Take 40 mg by mouth daily.     docusate sodium 100 MG capsule  Commonly known as:  COLACE  Take 1 capsule (100 mg total) by mouth 2 (two) times daily as needed for mild constipation.     estradiol 1 MG tablet  Commonly known as:  ESTRACE  Take 1 mg by mouth daily.     FLUoxetine 40 MG capsule  Commonly known as:  PROZAC  Take 40 mg by mouth at bedtime.     gabapentin 300 MG capsule  Commonly known as:  NEURONTIN  Take 300 mg by mouth 3 (three) times daily.     insulin aspart 100 UNIT/ML injection  Commonly known as:  novoLOG  Inject 3-4 Units into the skin 3 (three) times daily before meals. Sliding scale     methocarbamol 500 MG tablet  Commonly known as:  ROBAXIN  Take 1 tablet (500 mg total) by mouth 3 (three) times daily.     oxyCODONE-acetaminophen 5-325 MG per tablet  Commonly known as:  PERCOCET  Take 1 tablet by mouth every 4 (four) hours as needed.      phentermine 37.5 MG tablet  Commonly known as:  ADIPEX-P  Take 37.5 mg by mouth daily.     progesterone 100 MG capsule  Commonly known as:  PROMETRIUM  Take 100 mg by mouth daily.     senna 8.6 MG Tabs tablet  Commonly known as:  SENOKOT  Take 1 tablet by mouth at bedtime.     TOUJEO SOLOSTAR 300 UNIT/ML Sopn  Generic drug:  Insulin Glargine  Inject 30 Units into the skin  every morning.           Follow-up Information    Follow up with BEANE,JEFFREY C, MD In 2 weeks.   Specialty:  Orthopedic Surgery   Why:  For suture removal   Contact information:   [address] [place] [address] [phone]       Signed: Lacie Draft, PA-C Orthopaedic Surgery 05/10/2014, 4:09 PM

## 2014-05-10 NOTE — Op Note (Signed)
NAME:  [name], MANCINO NO.:  [id number]  MEDICAL RECORD NO.:  [id number]  LOCATION:  [facility]                         FACILITY:  [facility]  PHYSICIAN:  Susa Day, M.D.    DATE OF BIRTH:  11/10/1960  DATE OF PROCEDURE:  05/10/2014 DATE OF DISCHARGE:                              OPERATIVE REPORT   PREOPERATIVE DIAGNOSES:  Spinal stenosis, herniated nucleus pulposus at L5-S1, left.  POSTOPERATIVE DIAGNOSES:  Spinal stenosis, herniated nucleus pulposus at L5-S1, left.  PROCEDURE: 1. Microlumbar decompression L5-S1, left. 2. Foraminotomy at L5 and S1, left. 3. Microdiskectomy at 5-1, left.  ANESTHESIA:  General.  ASSISTANT:  Cleophas Dunker, PA.  SPECIMEN:  L5-S1 disk pathology.  HISTORY:  A 54 year old, diabetic, S1 radiculopathy, secondary HNP at L5- S1 with stenosis, refractory conservative treatment indicated for lumbar decompression, failing conservative treatment.  Risks and benefits discussed including bleeding, infection, damage to neurovascular structures, DVT, PE, anesthetic complications, need for fusion in the future, etc.  TECHNIQUE:  With the patient in supine position, after induction of adequate general anesthesia, 2 g Kefzol, she was placed prone on the [place] frame.  All bony prominences were well padded.  Lumbar region was prepped and draped in usual sterile fashion.  Two 18-gauge spinal needles were utilized to localize 5-1 interspace, confirmed with x-ray. Incision was made in spinous process of 5-1.  Subcutaneous tissue was dissected.  Electrocautery was utilized to achieve hemostasis. Paraspinous muscles elevated L5-S1.  McCullough retractor was placed. Confirmatory radiograph obtained.  Operating microscope was draped and brought in the surgical field.  Hemilaminotomy of the caudad edge of 5 was performed with a 2-mm Kerrison.  Ligamentum flavum detached from the cephalad edge of S1 utilizing straight curette.  Neuro patty  placed beneath the ligamentum flavum.  Foraminotomy of S1 was performed.  The S1 nerve was identified, gently mobilized medially.  It was compressed in the lateral recess secondary to facet hypertrophy and disk herniation.  We decompressed the lateral recess to the medial border of the pedicle.  Performed foraminotomy of 5.  Disk herniation was identified.  Annulotomy performed.  Copious portion of disk material was removed from the disk space with a straight and a micropituitary. Further mobilized with a nerve hook, and abstained preserving the endplates.  Following full diskectomy of herniated material, we checked the foramen of L5 and S1 beneath the thecal sac, and on the axilla and the shoulder of the root.  No residual disk herniation noted.  Copiously irrigated disk space with antibiotic irrigation.  Inspection revealed no evidence of CSF leakage or active bleeding.  There was 1 cm excursion of the S1 nerve root at medial of the pedicle without tension.  I removed the McCullough retractor after obtained a confirmatory radiograph. Inspection revealed no evidence of CSF leakage or active bleeding. Dorsolumbar fascia was closed with 1 Vicryl, subcu with 2-0, skin with Prolene, sterile dressing applied.  Placed supine on hospital bed, extubated without difficulty, and transported to the recovery in satisfactory condition.  The patient tolerated the procedure well.  No complications.  No blood loss.     Susa Day, M.D.     Geralynn Rile  D:  05/10/2014  T:  05/10/2014  Job:  [id number]

## 2014-05-10 NOTE — Evaluation (Signed)
Physical Therapy Evaluation Patient Details Name: [name] MRN: [id number] DOB: 1960-11-22 Today's Date: 05/10/2014   History of Present Illness  HNP L5-S1 LEFT   Clinical Impression  Pt mobilizing at IND level with min cues for adherence to back precautions.  Precautions reviewed x3 with written information provided.    Follow Up Recommendations No PT follow up    Equipment Recommendations  None recommended by PT    Recommendations for Other Services       Precautions / Restrictions Precautions Precautions: Back Precaution Booklet Issued: Yes (comment) Precaution Comments: Precautions reviewed x 3 Restrictions Weight Bearing Restrictions: No      Mobility  Bed Mobility Overal bed mobility: Modified Independent             General bed mobility comments: cues for log roll technique  Transfers Overall transfer level: Modified independent Equipment used: None             General transfer comment: cues for transition position and use of UEs  Ambulation/Gait Ambulation/Gait assistance: Supervision;Independent Ambulation Distance (Feet): 400 Feet Assistive device: None Gait Pattern/deviations: Step-through pattern Gait velocity: mod pace      Stairs Stairs: Yes Stairs assistance: Supervision Stair Management: One rail Right;Forwards;Alternating pattern Number of Stairs: 4    Wheelchair Mobility    Modified Rankin (Stroke Patients Only)       Balance Overall balance assessment: No apparent balance deficits (not formally assessed)                                           Pertinent Vitals/Pain Pain Assessment: 0-10 Pain Score: 3  Pain Location: back Pain Descriptors / Indicators: Sore Pain Intervention(s): Limited activity within patient's tolerance;Monitored during session    Home Living Family/patient expects to be discharged to:: Private residence Living Arrangements: Children Available Help at Discharge:  Family;Friend(s) Type of Home: House Home Access: Stairs to enter Entrance Stairs-Rails: Right Entrance Stairs-Number of Steps: 5 Home Layout: One level Home Equipment: None      Prior Function Level of Independence: Independent               Hand Dominance        Extremity/Trunk Assessment   Upper Extremity Assessment: Overall WFL for tasks assessed           Lower Extremity Assessment: Overall WFL for tasks assessed      Cervical / Trunk Assessment: Normal  Communication   Communication: No difficulties  Cognition Arousal/Alertness: Awake/alert Behavior During Therapy: WFL for tasks assessed/performed Overall Cognitive Status: Within Functional Limits for tasks assessed                      General Comments      Exercises        Assessment/Plan    PT Assessment Patent does not need any further PT services  PT Diagnosis Difficulty walking   PT Problem List    PT Treatment Interventions     PT Goals (Current goals can be found in the Care Plan section) Acute Rehab PT Goals Patient Stated Goal: HOME PT Goal Formulation: All assessment and education complete, DC therapy    Frequency     Barriers to discharge        Co-evaluation               End of Session  Activity Tolerance: Patient tolerated treatment well Patient left: in chair;with call bell/phone within reach;with family/visitor present Nurse Communication: Mobility status    Functional Assessment Tool Used: clinical judgement Functional Limitation: Mobility: Walking and moving around Mobility: Walking and Moving Around Current Status (D4373): At least 1 percent but less than 20 percent impaired, limited or restricted Mobility: Walking and Moving Around Goal Status [phone]): At least 1 percent but less than 20 percent impaired, limited or restricted Mobility: Walking and Moving Around Discharge Status [phone]): At least 1 percent but less than 20 percent impaired,  limited or restricted    Time: 1555-1615 PT Time Calculation (min) (ACUTE ONLY): 20 min   Charges:   PT Evaluation $Initial PT Evaluation Tier I: 1 Procedure     PT G Codes:   PT G-Codes **NOT FOR INPATIENT CLASS** Functional Assessment Tool Used: clinical judgement Functional Limitation: Mobility: Walking and moving around Mobility: Walking and Moving Around Current Status (X2820): At least 1 percent but less than 20 percent impaired, limited or restricted Mobility: Walking and Moving Around Goal Status [phone]): At least 1 percent but less than 20 percent impaired, limited or restricted Mobility: Walking and Moving Around Discharge Status [phone]): At least 1 percent but less than 20 percent impaired, limited or restricted    Lansing Sigmon 05/10/2014, 4:30 PM

## 2014-05-10 NOTE — Anesthesia Procedure Notes (Signed)
Procedure Name: Intubation Date/Time: 05/10/2014 8:38 AM Performed by: Maxwell Caul Pre-anesthesia Checklist: Patient identified, Emergency Drugs available, Suction available and Patient being monitored Patient Re-evaluated:Patient Re-evaluated prior to inductionPreoxygenation: Pre-oxygenation with 100% oxygen Intubation Type: IV induction Ventilation: Mask ventilation without difficulty Laryngoscope Size: Mac and 4 Grade View: Grade I Tube type: Oral Tube size: 7.5 mm Number of attempts: 1 Placement Confirmation: ETT inserted through vocal cords under direct vision,  positive ETCO2 and breath sounds checked- equal and bilateral Secured at: 20 cm Tube secured with: Tape Dental Injury: Teeth and Oropharynx as per pre-operative assessment

## 2014-05-10 NOTE — Anesthesia Postprocedure Evaluation (Signed)
  Anesthesia Post-op Note  Patient: [name]  Procedure(s) Performed: Procedure(s) (LRB): MICRODISCECTOMY LUMBAR DECOMPRESSION L5-S1 LEFT  (Left)  Patient Location: PACU  Anesthesia Type: General  Level of Consciousness: awake and alert   Airway and Oxygen Therapy: Patient Spontanous Breathing  Post-op Pain: mild  Post-op Assessment: Post-op Vital signs reviewed, Patient's Cardiovascular Status Stable, Respiratory Function Stable, Patent Airway and No signs of Nausea or vomiting  Last Vitals:  Filed Vitals:   05/10/14 1545  BP: 115/64  Pulse: 86  Temp: 36.9 C  Resp: 16    Post-op Vital Signs: stable   Complications: No apparent anesthesia complications

## 2014-05-10 NOTE — Anesthesia Preprocedure Evaluation (Addendum)
Anesthesia Evaluation  Patient identified by MRN, date of birth, ID band Patient awake    Reviewed: Allergy & Precautions, NPO status , Patient's Chart, lab work & pertinent test results  Airway Mallampati: II  TM Distance: >3 FB Neck ROM: Full    Dental no notable dental hx.    Pulmonary Current Smoker,  breath sounds clear to auscultation  Pulmonary exam normal       Cardiovascular negative cardio ROS  Rhythm:Regular Rate:Normal     Neuro/Psych negative neurological ROS  negative psych ROS   GI/Hepatic negative GI ROS, Neg liver ROS,   Endo/Other  diabetes, Insulin Dependent  Renal/GU negative Renal ROS  negative genitourinary   Musculoskeletal negative musculoskeletal ROS (+)   Abdominal   Peds negative pediatric ROS (+)  Hematology negative hematology ROS (+)   Anesthesia Other Findings   Reproductive/Obstetrics negative OB ROS                            Anesthesia Physical Anesthesia Plan  ASA: III  Anesthesia Plan: General   Post-op Pain Management:    Induction: Intravenous  Airway Management Planned: Oral ETT  Additional Equipment:   Intra-op Plan:   Post-operative Plan: Extubation in OR  Informed Consent: I have reviewed the patients History and Physical, chart, labs and discussed the procedure including the risks, benefits and alternatives for the proposed anesthesia with the patient or authorized representative who has indicated his/her understanding and acceptance.   Dental advisory given  Plan Discussed with: CRNA  Anesthesia Plan Comments:         Anesthesia Quick Evaluation

## 2014-05-10 NOTE — Discharge Instructions (Signed)
Walk As Tolerated utilizing back precautions.  No bending, twisting, or lifting.  No driving for 2 weeks.   °Aquacel dressing may remain in place until follow up. May shower with aquacel dressing in place. If the dressing peels off or becomes saturated, you may remove aquacel dressing and place gauze and tape dressing which should be kept clean and dry and changed daily. Do not remove steri-strips if they are present. °See Dr. Veroncia Jezek in office in 10 to 14 days. Begin taking aspirin 81mg per day starting 4 days after your surgery if not allergic to aspirin or on another blood thinner. °Walk daily even outside. Use a cane or walker only if necessary. °Avoid sitting on soft sofas. ° °

## 2014-05-10 NOTE — Interval H&P Note (Signed)
History and Physical Interval Note:  05/10/2014 9:55 AM  [name]  has presented today for surgery, with the diagnosis of HNP L5-S1 LEFT   The various methods of treatment have been discussed with the patient and family. After consideration of risks, benefits and other options for treatment, the patient has consented to  Procedure(s): MICRODISCECTOMY LUMBAR DECOMPRESSION L5-S1 LEFT  (Left) as a surgical intervention .  The patient's history has been reviewed, patient examined, no change in status, stable for surgery.  I have reviewed the patient's chart and labs.  Questions were answered to the patient's satisfaction.     Karolee Meloni C

## 2014-05-11 ENCOUNTER — Encounter (HOSPITAL_COMMUNITY): Payer: Self-pay | Admitting: Specialist

## 2014-05-11 NOTE — Care Management Note (Signed)
    Page 1 of 1   05/11/2014     12:56:19 PM CARE MANAGEMENT NOTE 05/11/2014  Patient:  [name], [name]   Account Number:  [id number]  Date Initiated:  05/11/2014  Documentation initiated by:  [facility]  Subjective/Objective Assessment:   outpt     Action/Plan:   no CM needs   Anticipated DC Date:     Anticipated DC Plan:           Choice offered to / List presented to:             Status of service:  Completed, signed off Medicare Important Message given?   (If response is "NO", the following Medicare IM given date fields will be blank) Date Medicare IM given:   Medicare IM given by:   Date Additional Medicare IM given:   Additional Medicare IM given by:    Discharge Disposition:    Per UR Regulation:    If discussed at Long Length of Stay Meetings, dates discussed:    Comments:  05/11/14 CM notes pt out pt and no PT follow up recc. and no DME recc.  No other Cm needs were communicated. Mariane Masters, BSN, CM [phone].

## 2015-06-05 DIAGNOSIS — I7090 Unspecified atherosclerosis: Secondary | ICD-10-CM

## 2015-06-05 DIAGNOSIS — D1800 Hemangioma unspecified site: Secondary | ICD-10-CM | POA: Insufficient documentation

## 2015-06-05 DIAGNOSIS — F5101 Primary insomnia: Secondary | ICD-10-CM | POA: Insufficient documentation

## 2015-06-05 DIAGNOSIS — F633 Trichotillomania: Secondary | ICD-10-CM | POA: Insufficient documentation

## 2015-06-05 DIAGNOSIS — F419 Anxiety disorder, unspecified: Secondary | ICD-10-CM

## 2015-06-05 HISTORY — DX: Primary insomnia: F51.01

## 2015-06-05 HISTORY — DX: Anxiety disorder, unspecified: F41.9

## 2015-06-05 HISTORY — DX: Unspecified atherosclerosis: I70.90

## 2015-07-10 DIAGNOSIS — M79604 Pain in right leg: Secondary | ICD-10-CM

## 2015-07-10 DIAGNOSIS — M79605 Pain in left leg: Secondary | ICD-10-CM | POA: Insufficient documentation

## 2015-07-10 HISTORY — DX: Pain in left leg: M79.605

## 2015-07-10 HISTORY — DX: Pain in right leg: M79.604

## 2015-07-11 DIAGNOSIS — Z6827 Body mass index (BMI) 27.0-27.9, adult: Secondary | ICD-10-CM

## 2015-07-11 DIAGNOSIS — E663 Overweight: Secondary | ICD-10-CM

## 2015-07-11 HISTORY — DX: Overweight: E66.3

## 2015-07-11 HISTORY — DX: Body mass index (BMI) 27.0-27.9, adult: Z68.27

## 2015-10-22 ENCOUNTER — Other Ambulatory Visit: Payer: Self-pay | Admitting: Obstetrics & Gynecology

## 2015-10-22 DIAGNOSIS — E2839 Other primary ovarian failure: Secondary | ICD-10-CM

## 2015-11-07 ENCOUNTER — Inpatient Hospital Stay: Admission: RE | Admit: 2015-11-07 | Payer: BC Managed Care – PPO | Source: Ambulatory Visit

## 2015-12-04 DIAGNOSIS — J42 Unspecified chronic bronchitis: Secondary | ICD-10-CM

## 2015-12-04 HISTORY — DX: Unspecified chronic bronchitis: J42

## 2015-12-06 DIAGNOSIS — R946 Abnormal results of thyroid function studies: Secondary | ICD-10-CM

## 2015-12-06 HISTORY — DX: Abnormal results of thyroid function studies: R94.6

## 2016-01-11 IMAGING — CR DG LUMBAR SPINE 2-3V
2 series · 2 of 2 positions shown · non-contrast
Comparison: MRI of lumbar spine February 26, 2013

CLINICAL DATA: Preoperative for lumbar spine surgery on [REDACTED].

EXAM:
LUMBAR SPINE - 2-3 VIEW

[w l-spine a.p.]
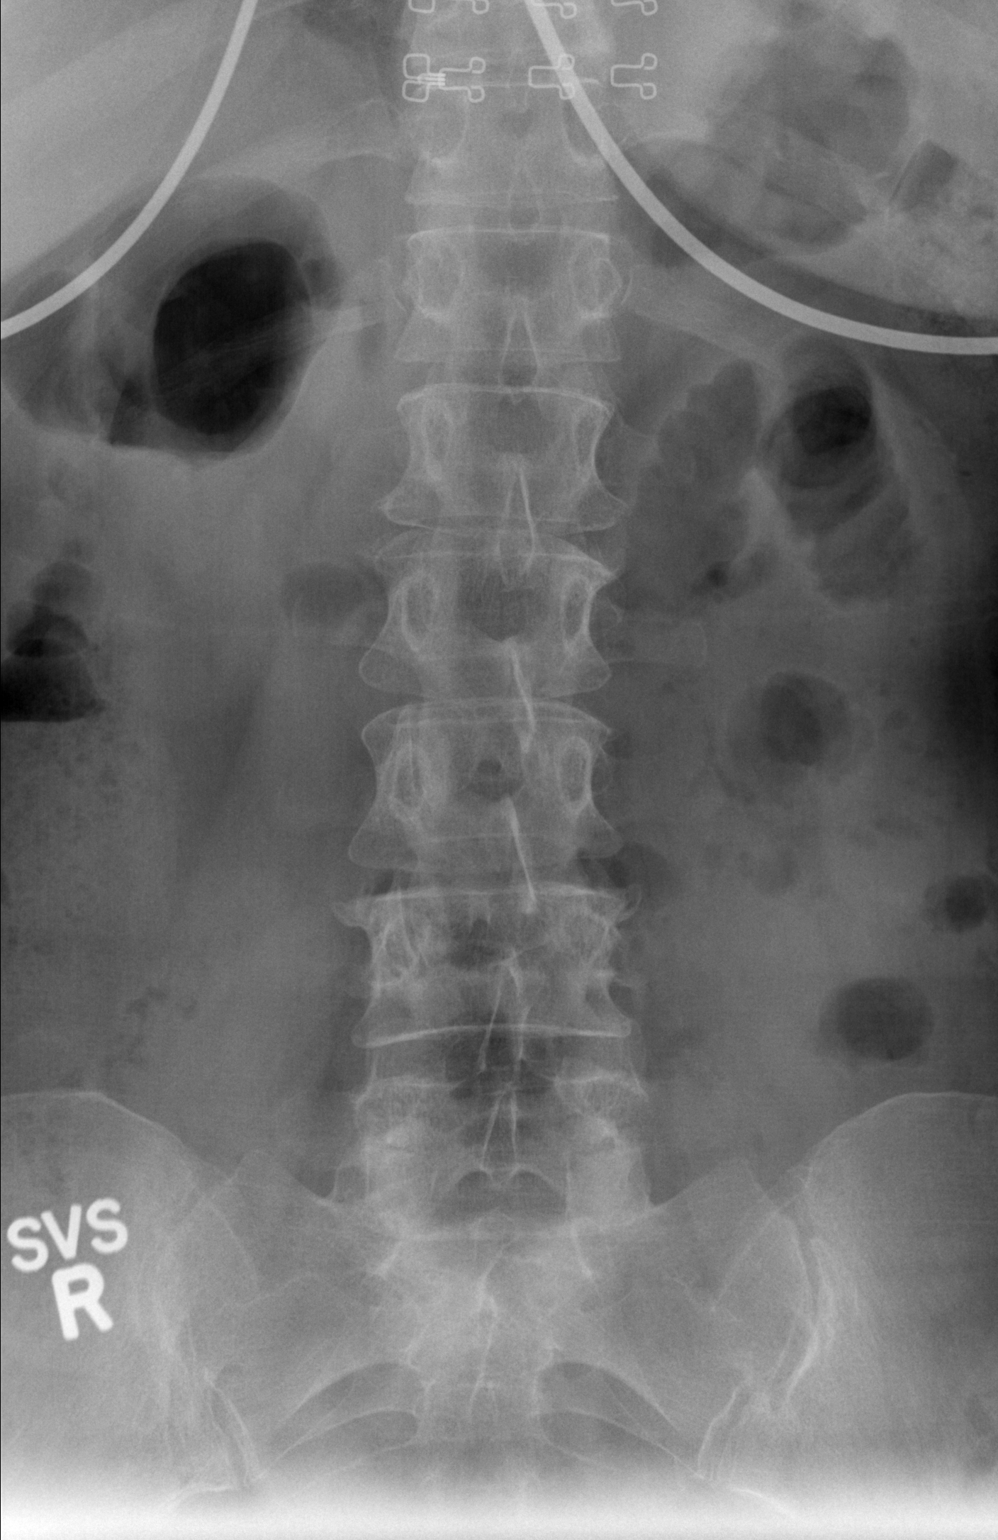

[w l-spine lat]
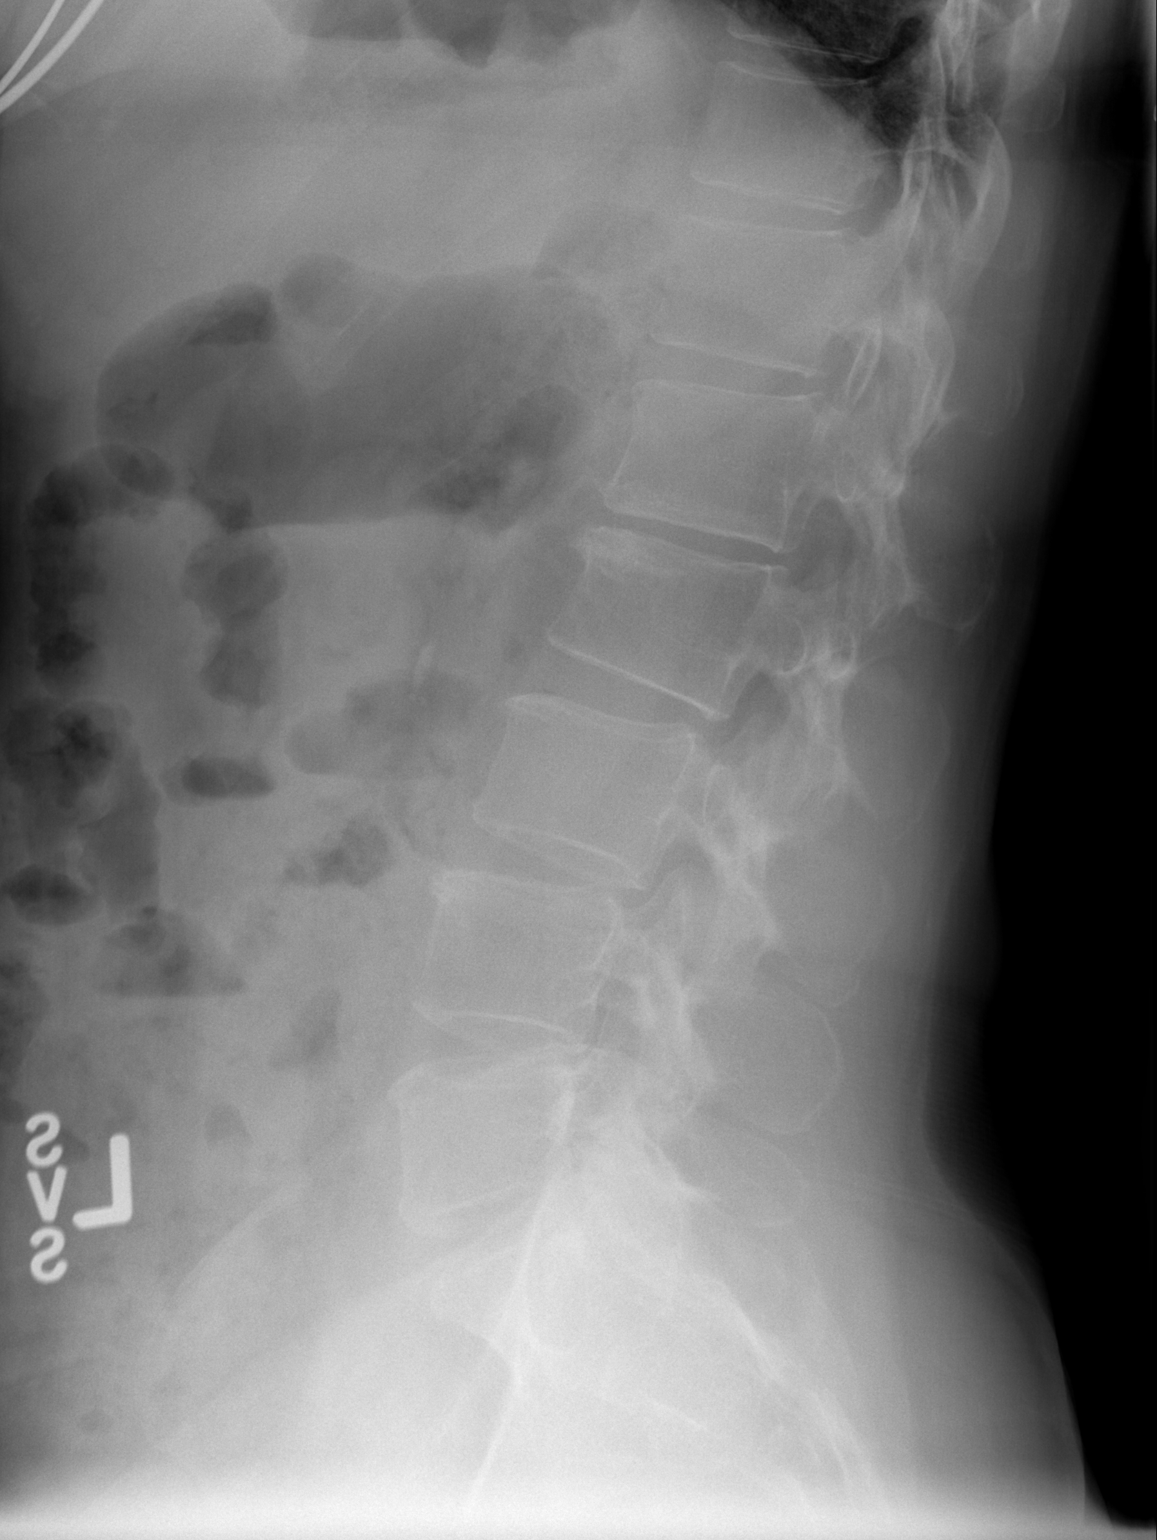

[2 of 2 positions shown; findings below may reference images not displayed]

FINDINGS: There is no evidence of lumbar spine fracture or dislocation.
Alignment is normal. There are degenerative joint changes with
narrowed joint space cyst at L1-2, L3-4, L4-5, L5-S1.
IMPRESSION: No acute abnormality.  Degenerative joint changes of lumbar spine.

## 2016-01-17 IMAGING — DX DG SPINE 1V PORT
1 series · 1 of 1 positions shown · non-contrast
Comparison: Preoperative lumbar spine films of 05/04/2014 and
intraoperative film from today

CLINICAL DATA: For lumbar spine decompression

EXAM:
PORTABLE SPINE - 1 VIEW

[AP]
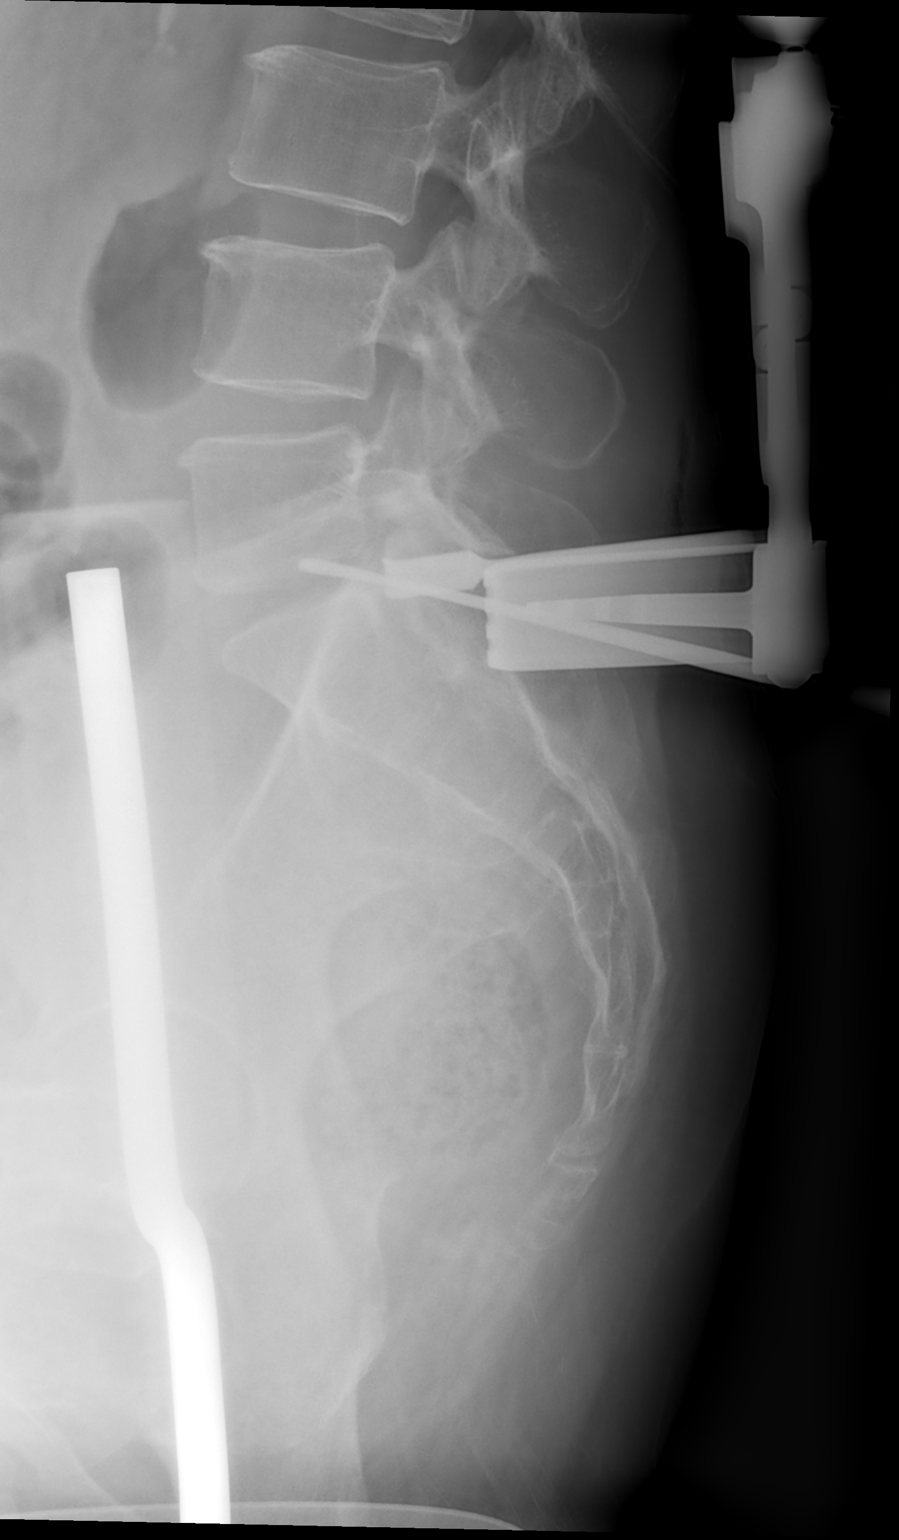

[1 of 1 positions shown; findings below may reference images not displayed]

FINDINGS: An instrument is directed posteriorly for localization of the L5-S1
interspace.
IMPRESSION: Localization of L5-S1 interspace on image #3.

## 2016-01-17 IMAGING — DX DG SPINE 1V PORT
1 series · 1 of 1 positions shown · non-contrast
Comparison: Lumbar spine films of 05/04/2014

CLINICAL DATA: Lumbar decompression

EXAM:
PORTABLE SPINE - 1 VIEW

[AP]
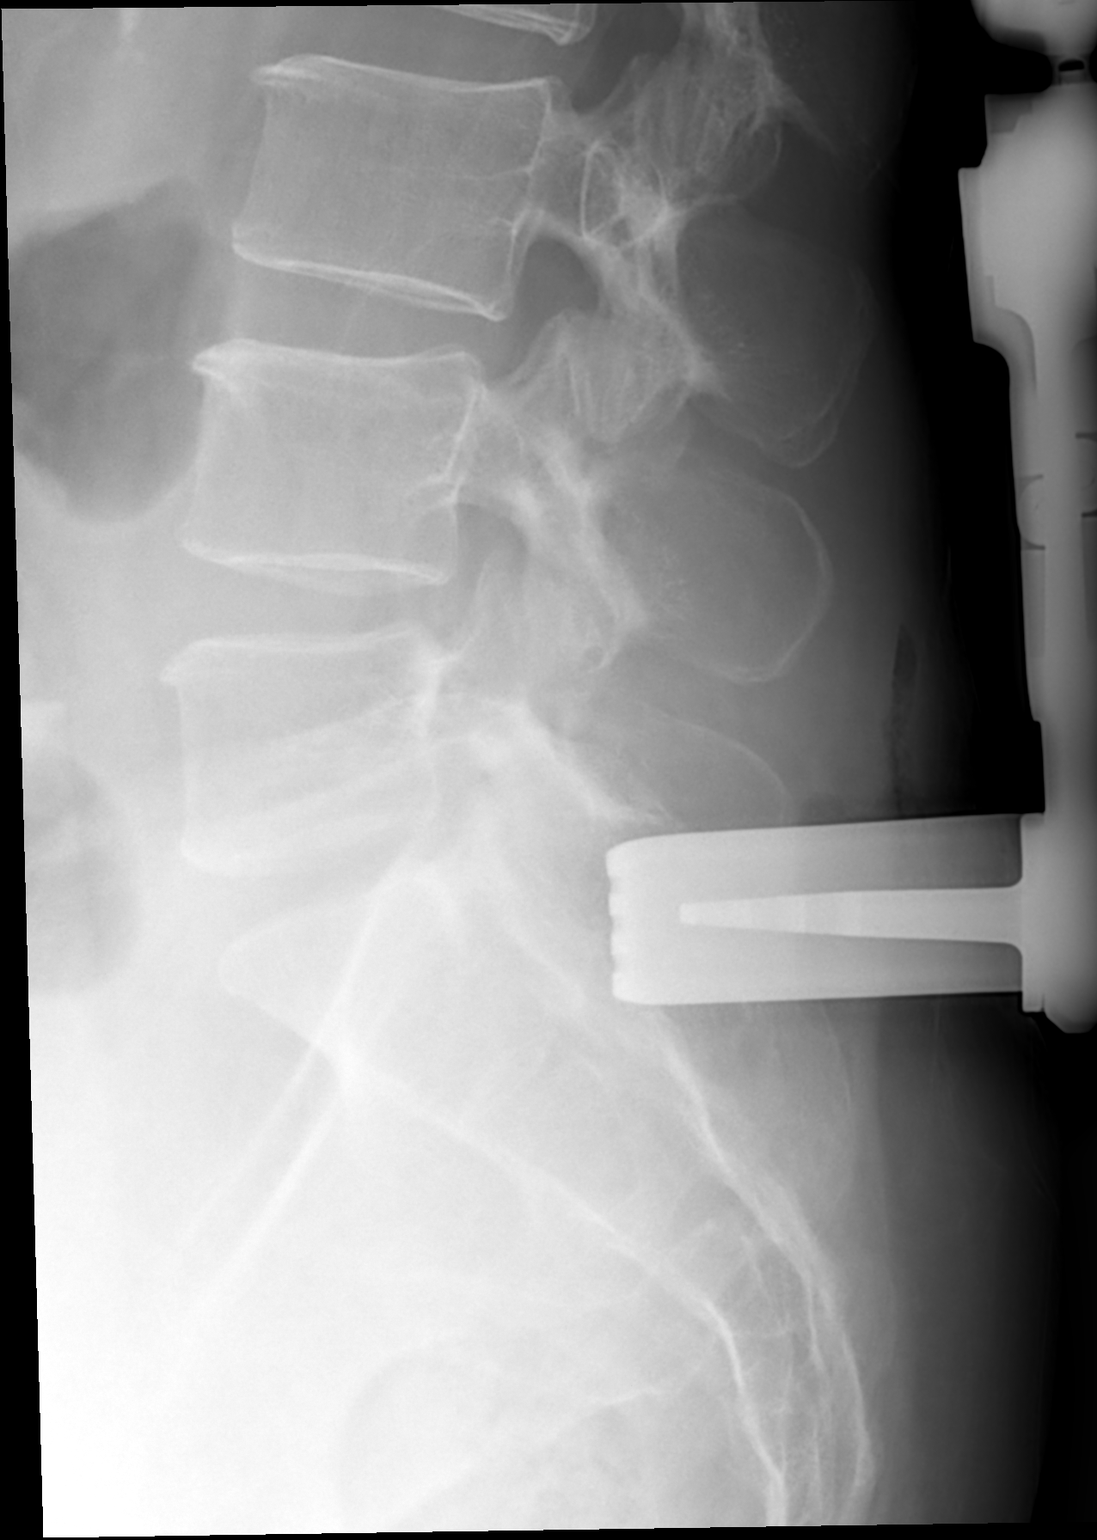

[1 of 1 positions shown; findings below may reference images not displayed]

FINDINGS: A single lateral view of the lumbar spine from the operating room
shows a clamp posteriorly with the anterior portion extending toward
the L5-S1 interspace.
IMPRESSION: Clamp on the inferior spinous process of L5 directed toward the
L5-S1 interspace.

## 2016-01-25 DIAGNOSIS — I25118 Atherosclerotic heart disease of native coronary artery with other forms of angina pectoris: Secondary | ICD-10-CM | POA: Insufficient documentation

## 2016-01-25 DIAGNOSIS — I2510 Atherosclerotic heart disease of native coronary artery without angina pectoris: Secondary | ICD-10-CM | POA: Insufficient documentation

## 2016-01-25 HISTORY — DX: Atherosclerotic heart disease of native coronary artery with other forms of angina pectoris: I25.118

## 2016-02-11 DIAGNOSIS — E109 Type 1 diabetes mellitus without complications: Secondary | ICD-10-CM

## 2016-02-11 DIAGNOSIS — E7800 Pure hypercholesterolemia, unspecified: Secondary | ICD-10-CM

## 2016-02-11 DIAGNOSIS — Z720 Tobacco use: Secondary | ICD-10-CM

## 2016-02-11 DIAGNOSIS — I2510 Atherosclerotic heart disease of native coronary artery without angina pectoris: Secondary | ICD-10-CM | POA: Insufficient documentation

## 2016-02-11 HISTORY — DX: Pure hypercholesterolemia, unspecified: E78.00

## 2016-02-11 HISTORY — DX: Tobacco use: Z72.0

## 2016-02-11 HISTORY — DX: Type 1 diabetes mellitus without complications: E10.9

## 2016-02-11 HISTORY — DX: Atherosclerotic heart disease of native coronary artery without angina pectoris: I25.10

## 2016-02-26 DIAGNOSIS — F17210 Nicotine dependence, cigarettes, uncomplicated: Secondary | ICD-10-CM | POA: Insufficient documentation

## 2016-02-26 HISTORY — DX: Nicotine dependence, cigarettes, uncomplicated: F17.210

## 2016-04-09 DIAGNOSIS — E782 Mixed hyperlipidemia: Secondary | ICD-10-CM

## 2016-04-09 HISTORY — DX: Mixed hyperlipidemia: E78.2

## 2016-11-07 DIAGNOSIS — E538 Deficiency of other specified B group vitamins: Secondary | ICD-10-CM

## 2016-11-07 HISTORY — DX: Deficiency of other specified B group vitamins: E53.8

## 2016-12-31 DIAGNOSIS — Z8781 Personal history of (healed) traumatic fracture: Secondary | ICD-10-CM

## 2016-12-31 HISTORY — DX: Personal history of (healed) traumatic fracture: Z87.81

## 2017-04-28 HISTORY — PX: COLONOSCOPY: SHX174

## 2017-05-26 DIAGNOSIS — M5416 Radiculopathy, lumbar region: Secondary | ICD-10-CM

## 2017-05-26 HISTORY — DX: Radiculopathy, lumbar region: M54.16

## 2017-11-03 DIAGNOSIS — M419 Scoliosis, unspecified: Secondary | ICD-10-CM

## 2017-11-03 DIAGNOSIS — M5430 Sciatica, unspecified side: Secondary | ICD-10-CM

## 2017-11-03 HISTORY — DX: Scoliosis, unspecified: M41.9

## 2017-11-03 HISTORY — DX: Sciatica, unspecified side: M54.30

## 2017-11-04 DIAGNOSIS — R1310 Dysphagia, unspecified: Secondary | ICD-10-CM

## 2017-11-04 HISTORY — DX: Dysphagia, unspecified: R13.10

## 2017-11-06 DIAGNOSIS — Z794 Long term (current) use of insulin: Secondary | ICD-10-CM

## 2017-11-06 DIAGNOSIS — G4700 Insomnia, unspecified: Secondary | ICD-10-CM | POA: Insufficient documentation

## 2017-11-06 DIAGNOSIS — M5136 Other intervertebral disc degeneration, lumbar region: Secondary | ICD-10-CM

## 2017-11-06 DIAGNOSIS — N959 Unspecified menopausal and perimenopausal disorder: Secondary | ICD-10-CM

## 2017-11-06 DIAGNOSIS — R5381 Other malaise: Secondary | ICD-10-CM | POA: Insufficient documentation

## 2017-11-06 DIAGNOSIS — E782 Mixed hyperlipidemia: Secondary | ICD-10-CM | POA: Insufficient documentation

## 2017-11-06 DIAGNOSIS — E538 Deficiency of other specified B group vitamins: Secondary | ICD-10-CM

## 2017-11-06 DIAGNOSIS — Z8781 Personal history of (healed) traumatic fracture: Secondary | ICD-10-CM

## 2017-11-06 DIAGNOSIS — R079 Chest pain, unspecified: Secondary | ICD-10-CM

## 2017-11-06 DIAGNOSIS — F633 Trichotillomania: Secondary | ICD-10-CM | POA: Insufficient documentation

## 2017-11-06 DIAGNOSIS — M51369 Other intervertebral disc degeneration, lumbar region without mention of lumbar back pain or lower extremity pain: Secondary | ICD-10-CM

## 2017-11-06 DIAGNOSIS — D692 Other nonthrombocytopenic purpura: Secondary | ICD-10-CM

## 2017-11-06 DIAGNOSIS — D1809 Hemangioma of other sites: Secondary | ICD-10-CM | POA: Insufficient documentation

## 2017-11-06 DIAGNOSIS — J449 Chronic obstructive pulmonary disease, unspecified: Secondary | ICD-10-CM | POA: Insufficient documentation

## 2017-11-06 DIAGNOSIS — R5383 Other fatigue: Secondary | ICD-10-CM | POA: Insufficient documentation

## 2017-11-06 DIAGNOSIS — I7090 Unspecified atherosclerosis: Secondary | ICD-10-CM

## 2017-11-06 DIAGNOSIS — L570 Actinic keratosis: Secondary | ICD-10-CM

## 2017-11-06 DIAGNOSIS — R5382 Chronic fatigue, unspecified: Secondary | ICD-10-CM | POA: Insufficient documentation

## 2017-11-06 DIAGNOSIS — D1800 Hemangioma unspecified site: Secondary | ICD-10-CM

## 2017-11-06 DIAGNOSIS — F419 Anxiety disorder, unspecified: Secondary | ICD-10-CM | POA: Insufficient documentation

## 2017-11-06 HISTORY — DX: Chronic obstructive pulmonary disease, unspecified: J44.9

## 2017-11-06 HISTORY — DX: Unspecified menopausal and perimenopausal disorder: N95.9

## 2017-11-06 HISTORY — DX: Other fatigue: R53.83

## 2017-11-06 HISTORY — DX: Personal history of (healed) traumatic fracture: Z87.81

## 2017-11-06 HISTORY — DX: Other nonthrombocytopenic purpura: D69.2

## 2017-11-06 HISTORY — DX: Other intervertebral disc degeneration, lumbar region without mention of lumbar back pain or lower extremity pain: M51.369

## 2017-11-06 HISTORY — DX: Other malaise: R53.81

## 2017-11-06 HISTORY — DX: Hemangioma unspecified site: D18.00

## 2017-11-06 HISTORY — DX: Actinic keratosis: L57.0

## 2017-11-06 HISTORY — DX: Deficiency of other specified B group vitamins: E53.8

## 2017-11-06 HISTORY — DX: Other intervertebral disc degeneration, lumbar region: M51.36

## 2017-11-06 HISTORY — DX: Unspecified atherosclerosis: I70.90

## 2017-11-06 HISTORY — DX: Chest pain, unspecified: R07.9

## 2017-11-06 HISTORY — DX: Long term (current) use of insulin: Z79.4

## 2017-11-23 ENCOUNTER — Ambulatory Visit: Payer: BC Managed Care – PPO | Admitting: Cardiology

## 2017-11-30 ENCOUNTER — Ambulatory Visit: Payer: BC Managed Care – PPO | Admitting: Cardiology

## 2017-12-04 ENCOUNTER — Encounter: Payer: Self-pay | Admitting: Cardiology

## 2017-12-04 DIAGNOSIS — R918 Other nonspecific abnormal finding of lung field: Secondary | ICD-10-CM

## 2017-12-04 DIAGNOSIS — D751 Secondary polycythemia: Secondary | ICD-10-CM

## 2017-12-04 DIAGNOSIS — D692 Other nonthrombocytopenic purpura: Secondary | ICD-10-CM

## 2017-12-04 DIAGNOSIS — M609 Myositis, unspecified: Secondary | ICD-10-CM

## 2017-12-04 DIAGNOSIS — Z8639 Personal history of other endocrine, nutritional and metabolic disease: Secondary | ICD-10-CM

## 2017-12-04 DIAGNOSIS — I739 Peripheral vascular disease, unspecified: Secondary | ICD-10-CM | POA: Insufficient documentation

## 2017-12-04 DIAGNOSIS — R0609 Other forms of dyspnea: Principal | ICD-10-CM

## 2017-12-04 DIAGNOSIS — R41840 Attention and concentration deficit: Secondary | ICD-10-CM

## 2017-12-04 DIAGNOSIS — R0600 Dyspnea, unspecified: Secondary | ICD-10-CM

## 2017-12-04 HISTORY — DX: Personal history of other endocrine, nutritional and metabolic disease: Z86.39

## 2017-12-04 HISTORY — DX: Secondary polycythemia: D75.1

## 2017-12-04 HISTORY — DX: Other nonspecific abnormal finding of lung field: R91.8

## 2017-12-04 HISTORY — DX: Other nonthrombocytopenic purpura: D69.2

## 2017-12-04 HISTORY — DX: Myositis, unspecified: M60.9

## 2017-12-04 HISTORY — DX: Attention and concentration deficit: R41.840

## 2017-12-04 HISTORY — DX: Peripheral vascular disease, unspecified: I73.9

## 2017-12-04 HISTORY — DX: Dyspnea, unspecified: R06.00

## 2017-12-04 HISTORY — DX: Other forms of dyspnea: R06.09

## 2017-12-24 ENCOUNTER — Ambulatory Visit (INDEPENDENT_AMBULATORY_CARE_PROVIDER_SITE_OTHER): Payer: BC Managed Care – PPO | Admitting: Cardiology

## 2017-12-24 ENCOUNTER — Encounter: Payer: Self-pay | Admitting: Cardiology

## 2017-12-24 VITALS — BP 112/62 | HR 76 | Ht 62.0 in | Wt 142.0 lb

## 2017-12-24 DIAGNOSIS — E139 Other specified diabetes mellitus without complications: Secondary | ICD-10-CM | POA: Insufficient documentation

## 2017-12-24 DIAGNOSIS — I209 Angina pectoris, unspecified: Secondary | ICD-10-CM | POA: Diagnosis not present

## 2017-12-24 DIAGNOSIS — Z720 Tobacco use: Secondary | ICD-10-CM | POA: Diagnosis not present

## 2017-12-24 DIAGNOSIS — I2510 Atherosclerotic heart disease of native coronary artery without angina pectoris: Secondary | ICD-10-CM

## 2017-12-24 DIAGNOSIS — E782 Mixed hyperlipidemia: Secondary | ICD-10-CM

## 2017-12-24 DIAGNOSIS — E088 Diabetes mellitus due to underlying condition with unspecified complications: Secondary | ICD-10-CM

## 2017-12-24 HISTORY — DX: Atherosclerotic heart disease of native coronary artery without angina pectoris: I25.10

## 2017-12-24 HISTORY — DX: Other specified diabetes mellitus without complications: E13.9

## 2017-12-24 MED ORDER — NITROGLYCERIN 0.4 MG SL SUBL: 0.4000 mg | SUBLINGUAL_TABLET | SUBLINGUAL | 11 refills | Status: DC | PRN

## 2017-12-24 NOTE — H&P (View-Only) (Signed)
Cardiology Office Note:    Date:  12/24/2017   ID:  [name], DOB [birth year], MRN [id number]  PCP:  [name], MD  Cardiologist:  [name], MD   Referring MD: [name], MD    ASSESSMENT:    1. Mixed hyperlipidemia   2. Angina pectoris ([place])   3. Coronary artery disease involving native coronary artery, angina presence unspecified, unspecified whether native or transplanted heart   4. Tobacco abuse   5. Diabetes mellitus due to underlying condition with complication, without long-term current use of insulin (HCC)    PLAN:    In order of problems listed above:  1. Secondary prevention stressed with the patient.  Importance of compliance with diet and medication stressed and she vocalized understanding.  Blood pressure stable.  Diet was discussed for dyslipidemia and diabetes mellitus.  She is not on ACE inhibitor most likely because of low blood pressure but this will be managed by her primary care physician. 2. Sublingual nitroglycerin prescription was sent, its protocol and 911 protocol explained and the patient vocalized understanding questions were answered to the patient's satisfaction 3. In view of the patient's symptoms, I discussed with the patient options for evaluation. Invasive and noninvasive options were given to the patient. I discussed stress testing and coronary angiography and left heart catheterization at length. Benefits, pros and cons of each approach were discussed at length. Patient had multiple questions which were answered to the patient's satisfaction. Patient opted for invasive evaluation and we will set up for coronary angiography and left heart catheterization. Further recommendations will be made based on the findings with coronary angiography. In the interim if the patient has any significant symptoms in hospital to the nearest emergency room. 4. I spent 5 minutes with the patient discussing solely about smoking. Smoking cessation was  counseled. I suggested to the patient also different medications and pharmacological interventions. Patient is keen to try stopping on its own at this time. He will get back to me if he needs any further assistance in this matter.   Medication Adjustments/Labs and Tests Ordered: Current medicines are reviewed at length with the patient today.  Concerns regarding medicines are outlined above.  No orders of the defined types were placed in this encounter.  No orders of the defined types were placed in this encounter.    History of Present Illness:    [name] is a 57 y.o. female who is being seen today for the evaluation of chest tightness at the request of [name], MD.  Patient is a pleasant 57 year old female.  She has past medical history of coronary artery disease.  Less than 2 years ago she underwent coronary angiography which revealed ostial LAD 50% stenosis.  She has history of essential hypertension and diabetes mellitus.  She also has dyslipidemia.  She leads a sedentary lifestyle because of bursitis of the hip.  Patient mentions to me that she had an episode of substernal chest tightness going to the neck.  For this reason she was referred here.  No orthopnea or PND.  At the time of my evaluation, the patient is alert awake oriented and in no distress.  Past Medical History:  Diagnosis Date  . Arthritis    back   . Diabetes mellitus without complication (HCC)    Type 1- injections only- recent change to [place] with improved A1C reading.  . Trichotillomania    hx.     Past Surgical History:  Procedure Laterality Date  . APPENDECTOMY    . HIP OPEN REDUCTION Right    ORIF  . LUMBAR LAMINECTOMY/DECOMPRESSION MICRODISCECTOMY Left 05/10/2014   Procedure: MICRODISCECTOMY LUMBAR DECOMPRESSION L5-S1 LEFT ;  Surgeon: Johnn Hai, MD;  Location: [facility];  Service: Orthopedics;  Laterality: Left;  . ORIF WRIST FRACTURE Right    retained hardware    Current  Medications: Current Meds  Medication Sig  . ALPRAZolam (XANAX) 0.5 MG tablet TAKE 1/2 TO 1 TABLET BY MOUTH EVERY 8 HOURS AS NEEDED FOR RESCUE/ANXIETY.  Marland Kitchen atorvastatin (LIPITOR) 80 MG tablet Take 1 tablet by mouth daily.  Marland Kitchen FLUoxetine (PROZAC) 40 MG capsule Take 2 capsules by mouth daily.  Marland Kitchen glucose blood (PRECISION QID TEST) test strip by Misc.(Non-Drug; Combo Route) route.  Marland Kitchen HYDROcodone-acetaminophen (NORCO/VICODIN) 5-325 MG tablet take 1 tablet by mouth every 6 to 8 hours if needed  . insulin aspart (NOVOLOG) 100 UNIT/ML injection Inject 3-4 Units into the skin 3 (three) times daily before meals. Sliding scale  . Insulin Pen Needle (B-D ULTRAFINE III SHORT PEN) 31G X 8 MM MISC USE AS DIRECTED THREE TIMES A DAY WITH LEVEMIR AND NOVOLOG  . linaclotide (LINZESS) 290 MCG CAPS capsule Take 290 mcg by mouth daily.  . Liraglutide -Weight Management (SAXENDA) 18 MG/3ML SOPN inject 0.4 milliliter (2.4 MILLIGRAMS) subcutaneously once daily  . methocarbamol (ROBAXIN) 500 MG tablet Take 1 tablet (500 mg total) by mouth 3 (three) times daily.     Allergies:   Celebrex [celecoxib] and Mobic [meloxicam]   Social History   Socioeconomic History  . Marital status: Single    Spouse name: Not on file  . Number of children: Not on file  . Years of education: Not on file  . Highest education level: Not on file  Occupational History  . Not on file  Social Needs  . Financial resource strain: Not on file  . Food insecurity:    Worry: Not on file    Inability: Not on file  . Transportation needs:    Medical: Not on file    Non-medical: Not on file  Tobacco Use  . Smoking status: Current Every Day Smoker    Packs/day: 0.50    Years: 30.00    Pack years: 15.00    Types: Cigarettes  . Smokeless tobacco: Never Used  Substance and Sexual Activity  . Alcohol use: Yes    Comment: weekends - social  . Drug use: No  . Sexual activity: Not on file  Lifestyle  . Physical activity:    Days per week:  Not on file    Minutes per session: Not on file  . Stress: Not on file  Relationships  . Social connections:    Talks on phone: Not on file    Gets together: Not on file    Attends religious service: Not on file    Active member of club or organization: Not on file    Attends meetings of clubs or organizations: Not on file    Relationship status: Not on file  Other Topics Concern  . Not on file  Social History Narrative  . Not on file     Family History: The patient's family history includes Cancer in her mother; Diabetes in her father and mother; Heart disease in her father; Hypertension in her father.  ROS:   Please see the history of present illness.    All other systems reviewed and are negative.  EKGs/Labs/Other Studies Reviewed:  The following studies were reviewed today: EKG reveals sinus rhythm with nonspecific ST-T changes.  Coronary angiography report from the other hospital was reviewed and discussed with her.   Recent Labs: No results found for requested labs within last 8760 hours.  Recent Lipid Panel No results found for: CHOL, TRIG, HDL, CHOLHDL, VLDL, LDLCALC, LDLDIRECT  Physical Exam:    VS:  BP 112/62 (BP Location: Right Arm, Patient Position: Sitting, Cuff Size: Normal)   Pulse 76   Ht 5\' 2"  (1.575 m)   Wt 142 lb (64.4 kg)   LMP 05/04/2008   SpO2 98%   BMI 25.97 kg/m     Wt Readings from Last 3 Encounters:  12/24/17 142 lb (64.4 kg)  05/10/14 151 lb 8 oz (68.7 kg)  05/04/14 151 lb 8 oz (68.7 kg)     GEN: Patient is in no acute distress HEENT: Normal NECK: No JVD; No carotid bruits LYMPHATICS: No lymphadenopathy CARDIAC: S1 S2 regular, 2/6 systolic murmur at the apex. RESPIRATORY:  Clear to auscultation without rales, wheezing or rhonchi  ABDOMEN: Soft, non-tender, non-distended MUSCULOSKELETAL:  No edema; No deformity  SKIN: Warm and dry NEUROLOGIC:  Alert and oriented x 3 PSYCHIATRIC:  Normal affect    Signed, [name],  MD  12/24/2017 4:03 PM    [facility]

## 2017-12-24 NOTE — Patient Instructions (Signed)
Medication Instructions:  Your physician has recommended you make the following change in your medication:  START Nitroglycerin 0.4 mg sublingual (under your tongue) as needed for chest pain. If experiencing chest pain, stop what you are doing and sit down. Take 1 nitroglycerin and wait 5 minutes. If chest pain continues, take another nitroglycerin and wait 5 minutes. If chest pain does not subside, take 1 more nitroglycerin and dial 911. You make take a total of 3 nitroglycerin in a 15 minute time frame.  Labwork: Your physician recommends that you have the following labs drawn: CBC and BMP  Testing/Procedures: A chest x-ray takes a picture of the organs and structures inside the chest, including the heart, lungs, and blood vessels. This test can show several things, including, whether the heart is enlarges; whether fluid is building up in the lungs; and whether pacemaker / defibrillator leads are still in place.     [address] [place] [address] Dept: [phone] Loc: [place]  12/24/2017  You are scheduled for a Cardiac Catheterization on Thursday, September 5 with Dr. Peter Martinique.  1. Please arrive at the [facility] (Main Entrance A) at [facility]: [address] [place], [place] [address] at 7:00 AM (This time is two hours before your procedure to ensure your preparation). Free valet parking service is available.   Special note: Every effort is made to have your procedure done on time. Please understand that emergencies sometimes delay scheduled procedures.  2. Diet: Do not eat solid foods after midnight.  The patient may have clear liquids until 5am upon the day of the procedure.  3. Labs: Done today.  4. Medication instructions in preparation for your procedure:  Do not take your novolog the morning of the heart cath.  On the morning of your  procedure, take your Aspirin and any morning medicines NOT listed above.  You may use sips of water.  5. Plan for one night stay--bring personal belongings. 6. Bring a current list of your medications and current insurance cards. 7. You MUST have a responsible person to drive you home. 8. Someone MUST be with you the first 24 hours after you arrive home or your discharge will be delayed. 9. Please wear clothes that are easy to get on and off and wear slip-on shoes.  Thank you for allowing [place] to care for you!   -- [facility]   Follow-Up: Your physician recommends that you schedule a follow-up appointment in: 1 month  Any Other Special Instructions Will Be Listed Below (If Applicable).     If you need a refill on your cardiac medications before your next appointment, please call your pharmacy.   Kelliher, RN, BSN   Coronary Angiogram With Stent Coronary angiogram with stent placement is a procedure to widen or open a narrow blood vessel of the heart (coronary artery). Arteries may become blocked by cholesterol buildup (plaques) in the lining of the wall. When a coronary artery becomes partially blocked, blood flow to that area decreases. This may lead to chest pain or a heart attack (myocardial infarction). A stent is a small piece of metal that looks like mesh or a spring. Stent placement may be done as treatment for a heart attack or right after a coronary angiogram in which a blocked artery is found. Let your health care provider know about:  Any allergies you have.  All medicines you are taking, including vitamins, herbs, eye drops, creams, and over-the-counter medicines.  Any problems you or family members have had with anesthetic medicines.  Any blood disorders you have.  Any surgeries you have had.  Any medical conditions you have.  Whether you are pregnant or may be pregnant. What are the risks? Generally, this is  a safe procedure. However, problems may occur, including:  Damage to the heart or its blood vessels.  A return of blockage.  Bleeding, infection, or bruising at the insertion site.  A collection of blood under the skin (hematoma) at the insertion site.  A blood clot in another part of the body.  Kidney injury.  Allergic reaction to the dye or contrast that is used.  Bleeding into the abdomen (retroperitoneal bleeding).  What happens before the procedure? Staying hydrated Follow instructions from your health care provider about hydration, which may include:  Up to 2 hours before the procedure - you may continue to drink clear liquids, such as water, clear fruit juice, black coffee, and plain tea.  Eating and drinking restrictions Follow instructions from your health care provider about eating and drinking, which may include:  8 hours before the procedure - stop eating heavy meals or foods such as meat, fried foods, or fatty foods.  6 hours before the procedure - stop eating light meals or foods, such as toast or cereal.  2 hours before the procedure - stop drinking clear liquids.  Ask your health care provider about:  Changing or stopping your regular medicines. This is especially important if you are taking diabetes medicines or blood thinners.  Taking medicines such as ibuprofen. These medicines can thin your blood. Do not take these medicines before your procedure if your health care provider instructs you not to. Generally, aspirin is recommended before a procedure of passing a small, thin tube (catheter) through a blood vessel and into the heart (cardiac catheterization).  What happens during the procedure?  An IV tube will be inserted into one of your veins.  You will be given one or more of the following: ? A medicine to help you relax (sedative). ? A medicine to numb the area where the catheter will be inserted into an artery (local anesthetic).  To reduce your  risk of infection: ? Your health care team will wash or sanitize their hands. ? Your skin will be washed with soap. ? Hair may be removed from the area where the catheter will be inserted.  Using a guide wire, the catheter will be inserted into an artery. The location may be in your groin, in your wrist, or in the fold of your arm (near your elbow).  A type of X-ray (fluoroscopy) will be used to help guide the catheter to the opening of the arteries in the heart.  A dye will be injected into the catheter, and X-rays will be taken. The dye will help to show where any narrowing or blockages are located in the arteries.  A tiny wire will be guided to the blocked spot, and a balloon will be inflated to make the artery wider.  The stent will be expanded and will crush the plaques into the wall of the vessel. The stent will hold the area open and improve the blood flow. Most stents have a drug coating to reduce the risk of the stent narrowing over time.  The artery may be made wider using a drill, laser, or other tools  to remove plaques.  When the blood flow is better, the catheter will be removed. The lining of the artery will grow over the stent, which stays where it was placed. This procedure may vary among health care providers and hospitals. What happens after the procedure?  If the procedure is done through the leg, you will be kept in bed lying flat for about 6 hours. You will be instructed to not bend and not cross your legs.  The insertion site will be checked frequently.  The pulse in your foot or wrist will be checked frequently.  You may have additional blood tests, X-rays, and a test that records the electrical activity of your heart (electrocardiogram, or ECG). This information is not intended to replace advice given to you by your health care provider. Make sure you discuss any questions you have with your health care provider. Document Released: 10/19/2002 Document Revised:  12/13/2015 Document Reviewed: 11/18/2015 Elsevier Interactive Patient Education  2018 [employer]. Nitroglycerin sublingual tablets What is this medicine? NITROGLYCERIN (nye troe GLI ser in) is a type of vasodilator. It relaxes blood vessels, increasing the blood and oxygen supply to your heart. This medicine is used to relieve chest pain caused by angina. It is also used to prevent chest pain before activities like climbing stairs, going outdoors in cold weather, or sexual activity. This medicine may be used for other purposes; ask your health care provider or pharmacist if you have questions. COMMON BRAND NAME(S): Nitroquick, Nitrostat, Nitrotab What should I tell my health care provider before I take this medicine? They need to know if you have any of these conditions: -anemia -head injury, recent stroke, or bleeding in the brain -liver disease -previous heart attack -an unusual or allergic reaction to nitroglycerin, other medicines, foods, dyes, or preservatives -pregnant or trying to get pregnant -breast-feeding How should I use this medicine? Take this medicine by mouth as needed. At the first sign of an angina attack (chest pain or tightness) place one tablet under your tongue. You can also take this medicine 5 to 10 minutes before an event likely to produce chest pain. Follow the directions on the prescription label. Let the tablet dissolve under the tongue. Do not swallow whole. Replace the dose if you accidentally swallow it. It will help if your mouth is not dry. Saliva around the tablet will help it to dissolve more quickly. Do not eat or drink, smoke or chew tobacco while a tablet is dissolving. If you are not better within 5 minutes after taking ONE dose of nitroglycerin, call 9-1-1 immediately to seek emergency medical care. Do not take more than 3 nitroglycerin tablets over 15 minutes. If you take this medicine often to relieve symptoms of angina, your doctor or health care  professional may provide you with different instructions to manage your symptoms. If symptoms do not go away after following these instructions, it is important to call 9-1-1 immediately. Do not take more than 3 nitroglycerin tablets over 15 minutes. Talk to your pediatrician regarding the use of this medicine in children. Special care may be needed. Overdosage: If you think you have taken too much of this medicine contact a poison control center or emergency room at once. NOTE: This medicine is only for you. Do not share this medicine with others. What if I miss a dose? This does not apply. This medicine is only used as needed. What may interact with this medicine? Do not take this medicine with any of the following medications: -  certain migraine medicines like ergotamine and dihydroergotamine (DHE) -medicines used to treat erectile dysfunction like sildenafil, tadalafil, and vardenafil -riociguat This medicine may also interact with the following medications: -alteplase -aspirin -heparin -medicines for high blood pressure -medicines for mental depression -other medicines used to treat angina -phenothiazines like chlorpromazine, mesoridazine, prochlorperazine, thioridazine This list may not describe all possible interactions. Give your health care provider a list of all the medicines, herbs, non-prescription drugs, or dietary supplements you use. Also tell them if you smoke, drink alcohol, or use illegal drugs. Some items may interact with your medicine. What should I watch for while using this medicine? Tell your doctor or health care professional if you feel your medicine is no longer working. Keep this medicine with you at all times. Sit or lie down when you take your medicine to prevent falling if you feel dizzy or faint after using it. Try to remain calm. This will help you to feel better faster. If you feel dizzy, take several deep breaths and lie down with your feet propped up, or bend  forward with your head resting between your knees. You may get drowsy or dizzy. Do not drive, use machinery, or do anything that needs mental alertness until you know how this drug affects you. Do not stand or sit up quickly, especially if you are an older patient. This reduces the risk of dizzy or fainting spells. Alcohol can make you more drowsy and dizzy. Avoid alcoholic drinks. Do not treat yourself for coughs, colds, or pain while you are taking this medicine without asking your doctor or health care professional for advice. Some ingredients may increase your blood pressure. What side effects may I notice from receiving this medicine? Side effects that you should report to your doctor or health care professional as soon as possible: -blurred vision -dry mouth -skin rash -sweating -the feeling of extreme pressure in the head -unusually weak or tired Side effects that usually do not require medical attention (report to your doctor or health care professional if they continue or are bothersome): -flushing of the face or neck -headache -irregular heartbeat, palpitations -nausea, vomiting This list may not describe all possible side effects. Call your doctor for medical advice about side effects. You may report side effects to FDA at 1-800-FDA-1088. Where should I keep my medicine? Keep out of the reach of children. Store at room temperature between 20 and 25 degrees C (68 and 77 degrees F). Store in [facility] [employer]. Protect from light and moisture. Keep tightly closed. Throw away any unused medicine after the expiration date. NOTE: This sheet is a summary. It may not cover all possible information. If you have questions about this medicine, talk to your doctor, pharmacist, or health care provider.  2018 Elsevier/Gold Standard (2013-02-10 17:57:36)

## 2017-12-24 NOTE — Progress Notes (Signed)
Cardiology Office Note:    Date:  12/24/2017   ID:  [name], DOB 10/08/1960, MRN [id number]  PCP:  [name], MD  Cardiologist:  [name], MD   Referring MD: [name], MD    ASSESSMENT:    1. Mixed hyperlipidemia   2. Angina pectoris ([place])   3. Coronary artery disease involving native coronary artery, angina presence unspecified, unspecified whether native or transplanted heart   4. Tobacco abuse   5. Diabetes mellitus due to underlying condition with complication, without long-term current use of insulin (HCC)    PLAN:    In order of problems listed above:  1. Secondary prevention stressed with the patient.  Importance of compliance with diet and medication stressed and she vocalized understanding.  Blood pressure stable.  Diet was discussed for dyslipidemia and diabetes mellitus.  She is not on ACE inhibitor most likely because of low blood pressure but this will be managed by her primary care physician. 2. Sublingual nitroglycerin prescription was sent, its protocol and 911 protocol explained and the patient vocalized understanding questions were answered to the patient's satisfaction 3. In view of the patient's symptoms, I discussed with the patient options for evaluation. Invasive and noninvasive options were given to the patient. I discussed stress testing and coronary angiography and left heart catheterization at length. Benefits, pros and cons of each approach were discussed at length. Patient had multiple questions which were answered to the patient's satisfaction. Patient opted for invasive evaluation and we will set up for coronary angiography and left heart catheterization. Further recommendations will be made based on the findings with coronary angiography. In the interim if the patient has any significant symptoms in hospital to the nearest emergency room. 4. I spent 5 minutes with the patient discussing solely about smoking. Smoking cessation was  counseled. I suggested to the patient also different medications and pharmacological interventions. Patient is keen to try stopping on its own at this time. He will get back to me if he needs any further assistance in this matter.   Medication Adjustments/Labs and Tests Ordered: Current medicines are reviewed at length with the patient today.  Concerns regarding medicines are outlined above.  No orders of the defined types were placed in this encounter.  No orders of the defined types were placed in this encounter.    History of Present Illness:    [name] is a 57 y.o. female who is being seen today for the evaluation of chest tightness at the request of [name], MD.  Patient is a pleasant 57 year old female.  She has past medical history of coronary artery disease.  Less than 2 years ago she underwent coronary angiography which revealed ostial LAD 50% stenosis.  She has history of essential hypertension and diabetes mellitus.  She also has dyslipidemia.  She leads a sedentary lifestyle because of bursitis of the hip.  Patient mentions to me that she had an episode of substernal chest tightness going to the neck.  For this reason she was referred here.  No orthopnea or PND.  At the time of my evaluation, the patient is alert awake oriented and in no distress.  Past Medical History:  Diagnosis Date  . Arthritis    back   . Diabetes mellitus without complication (HCC)    Type 1- injections only- recent change to Toujeo with improved A1C reading.  . Trichotillomania    hx.     Past Surgical History:  Procedure Laterality Date  . APPENDECTOMY    . HIP OPEN REDUCTION Right    ORIF  . LUMBAR LAMINECTOMY/DECOMPRESSION MICRODISCECTOMY Left 05/10/2014   Procedure: MICRODISCECTOMY LUMBAR DECOMPRESSION L5-S1 LEFT ;  Surgeon: Johnn Hai, MD;  Location: [facility];  Service: Orthopedics;  Laterality: Left;  . ORIF WRIST FRACTURE Right    retained hardware    Current  Medications: Current Meds  Medication Sig  . ALPRAZolam (XANAX) 0.5 MG tablet TAKE 1/2 TO 1 TABLET BY MOUTH EVERY 8 HOURS AS NEEDED FOR RESCUE/ANXIETY.  Marland Kitchen atorvastatin (LIPITOR) 80 MG tablet Take 1 tablet by mouth daily.  Marland Kitchen FLUoxetine (PROZAC) 40 MG capsule Take 2 capsules by mouth daily.  Marland Kitchen glucose blood (PRECISION QID TEST) test strip by Misc.(Non-Drug; Combo Route) route.  Marland Kitchen HYDROcodone-acetaminophen (NORCO/VICODIN) 5-325 MG tablet take 1 tablet by mouth every 6 to 8 hours if needed  . insulin aspart (NOVOLOG) 100 UNIT/ML injection Inject 3-4 Units into the skin 3 (three) times daily before meals. Sliding scale  . Insulin Pen Needle (B-D ULTRAFINE III SHORT PEN) 31G X 8 MM MISC USE AS DIRECTED THREE TIMES A DAY WITH LEVEMIR AND NOVOLOG  . linaclotide (LINZESS) 290 MCG CAPS capsule Take 290 mcg by mouth daily.  . Liraglutide -Weight Management (SAXENDA) 18 MG/3ML SOPN inject 0.4 milliliter (2.4 MILLIGRAMS) subcutaneously once daily  . methocarbamol (ROBAXIN) 500 MG tablet Take 1 tablet (500 mg total) by mouth 3 (three) times daily.     Allergies:   Celebrex [celecoxib] and Mobic [meloxicam]   Social History   Socioeconomic History  . Marital status: Single    Spouse name: Not on file  . Number of children: Not on file  . Years of education: Not on file  . Highest education level: Not on file  Occupational History  . Not on file  Social Needs  . Financial resource strain: Not on file  . Food insecurity:    Worry: Not on file    Inability: Not on file  . Transportation needs:    Medical: Not on file    Non-medical: Not on file  Tobacco Use  . Smoking status: Current Every Day Smoker    Packs/day: 0.50    Years: 30.00    Pack years: 15.00    Types: Cigarettes  . Smokeless tobacco: Never Used  Substance and Sexual Activity  . Alcohol use: Yes    Comment: weekends - social  . Drug use: No  . Sexual activity: Not on file  Lifestyle  . Physical activity:    Days per week:  Not on file    Minutes per session: Not on file  . Stress: Not on file  Relationships  . Social connections:    Talks on phone: Not on file    Gets together: Not on file    Attends religious service: Not on file    Active member of club or organization: Not on file    Attends meetings of clubs or organizations: Not on file    Relationship status: Not on file  Other Topics Concern  . Not on file  Social History Narrative  . Not on file     Family History: The patient's family history includes Cancer in her mother; Diabetes in her father and mother; Heart disease in her father; Hypertension in her father.  ROS:   Please see the history of present illness.    All other systems reviewed and are negative.  EKGs/Labs/Other Studies Reviewed:  The following studies were reviewed today: EKG reveals sinus rhythm with nonspecific ST-T changes.  Coronary angiography report from the other hospital was reviewed and discussed with her.   Recent Labs: No results found for requested labs within last 8760 hours.  Recent Lipid Panel No results found for: CHOL, TRIG, HDL, CHOLHDL, VLDL, LDLCALC, LDLDIRECT  Physical Exam:    VS:  BP 112/62 (BP Location: Right Arm, Patient Position: Sitting, Cuff Size: Normal)   Pulse 76   Ht 5\' 2"  (1.575 m)   Wt 142 lb (64.4 kg)   LMP 05/04/2008   SpO2 98%   BMI 25.97 kg/m     Wt Readings from Last 3 Encounters:  12/24/17 142 lb (64.4 kg)  05/10/14 151 lb 8 oz (68.7 kg)  05/04/14 151 lb 8 oz (68.7 kg)     GEN: Patient is in no acute distress HEENT: Normal NECK: No JVD; No carotid bruits LYMPHATICS: No lymphadenopathy CARDIAC: S1 S2 regular, 2/6 systolic murmur at the apex. RESPIRATORY:  Clear to auscultation without rales, wheezing or rhonchi  ABDOMEN: Soft, non-tender, non-distended MUSCULOSKELETAL:  No edema; No deformity  SKIN: Warm and dry NEUROLOGIC:  Alert and oriented x 3 PSYCHIATRIC:  Normal affect    Signed, [name],  MD  12/24/2017 4:03 PM    [facility]

## 2017-12-25 LAB — CBC WITH DIFFERENTIAL/PLATELET
BASOS: 0 %
Basophils Absolute: 0 10*3/uL (ref 0.0–0.2)
EOS (ABSOLUTE): 0.2 10*3/uL (ref 0.0–0.4)
EOS: 2 %
HEMATOCRIT: 44.1 % (ref 34.0–46.6)
Hemoglobin: 14.2 g/dL (ref 11.1–15.9)
Immature Grans (Abs): 0 10*3/uL (ref 0.0–0.1)
Immature Granulocytes: 0 %
Lymphocytes Absolute: 2.8 10*3/uL (ref 0.7–3.1)
Lymphs: 26 %
MCH: 31.3 pg (ref 26.6–33.0)
MCHC: 32.2 g/dL (ref 31.5–35.7)
MCV: 97 fL (ref 79–97)
Monocytes Absolute: 0.6 10*3/uL (ref 0.1–0.9)
Monocytes: 6 %
Neutrophils Absolute: 7.1 10*3/uL — ABNORMAL HIGH (ref 1.4–7.0)
Neutrophils: 66 %
Platelets: 248 10*3/uL (ref 150–450)
RBC: 4.54 x10E6/uL (ref 3.77–5.28)
RDW: 13.7 % (ref 12.3–15.4)
WBC: 10.8 10*3/uL (ref 3.4–10.8)

## 2017-12-25 LAB — BASIC METABOLIC PANEL
BUN/Creatinine Ratio: 20 (ref 9–23)
BUN: 13 mg/dL (ref 6–24)
CO2: 26 mmol/L (ref 20–29)
CREATININE: 0.66 mg/dL (ref 0.57–1.00)
Calcium: 9.7 mg/dL (ref 8.7–10.2)
Chloride: 103 mmol/L (ref 96–106)
GFR, EST AFRICAN AMERICAN: 113 mL/min/{1.73_m2} (ref >59)
GFR, EST NON AFRICAN AMERICAN: 98 mL/min/{1.73_m2} (ref >59)
Glucose: 60 mg/dL — ABNORMAL LOW (ref 65–99)
Potassium: 4.5 mmol/L (ref 3.5–5.2)
SODIUM: 143 mmol/L (ref 134–144)

## 2017-12-30 ENCOUNTER — Telehealth: Payer: Self-pay

## 2017-12-30 NOTE — Telephone Encounter (Signed)
Left CVM per DPR:  Patient contacted pre-catheterization at [facility] scheduled for: 12/31/2017 at 0900 Verified arrival time and place:  Admitting at 700 Confirmed AM meds to be taken pre-cath with sip of water: Take ASA Hold novolog, tresiba (day of) Confirmed patient has responsible person to drive home post procedure and observe patient for 24 hours:  yes

## 2017-12-31 ENCOUNTER — Ambulatory Visit (HOSPITAL_COMMUNITY)
Admission: RE | Admit: 2017-12-31 | Discharge: 2017-12-31 | Disposition: A | Discharge status: Home / Self Care | Payer: BC Managed Care – PPO | Source: Ambulatory Visit | Attending: Cardiology | Admitting: Cardiology

## 2017-12-31 ENCOUNTER — Encounter (HOSPITAL_COMMUNITY)
Admission: RE | Disposition: A | Discharge status: Home / Self Care | Payer: Self-pay | Source: Ambulatory Visit | Attending: Cardiology

## 2017-12-31 DIAGNOSIS — I2584 Coronary atherosclerosis due to calcified coronary lesion: Secondary | ICD-10-CM | POA: Diagnosis not present

## 2017-12-31 DIAGNOSIS — F17210 Nicotine dependence, cigarettes, uncomplicated: Secondary | ICD-10-CM | POA: Diagnosis not present

## 2017-12-31 DIAGNOSIS — Z9889 Other specified postprocedural states: Secondary | ICD-10-CM | POA: Diagnosis not present

## 2017-12-31 DIAGNOSIS — I25118 Atherosclerotic heart disease of native coronary artery with other forms of angina pectoris: Secondary | ICD-10-CM | POA: Diagnosis present

## 2017-12-31 DIAGNOSIS — Z8249 Family history of ischemic heart disease and other diseases of the circulatory system: Secondary | ICD-10-CM | POA: Diagnosis not present

## 2017-12-31 DIAGNOSIS — Z886 Allergy status to analgesic agent status: Secondary | ICD-10-CM | POA: Diagnosis not present

## 2017-12-31 DIAGNOSIS — E119 Type 2 diabetes mellitus without complications: Secondary | ICD-10-CM | POA: Diagnosis not present

## 2017-12-31 DIAGNOSIS — Z79899 Other long term (current) drug therapy: Secondary | ICD-10-CM | POA: Insufficient documentation

## 2017-12-31 DIAGNOSIS — Z794 Long term (current) use of insulin: Secondary | ICD-10-CM | POA: Insufficient documentation

## 2017-12-31 DIAGNOSIS — I25119 Atherosclerotic heart disease of native coronary artery with unspecified angina pectoris: Secondary | ICD-10-CM | POA: Insufficient documentation

## 2017-12-31 DIAGNOSIS — M1990 Unspecified osteoarthritis, unspecified site: Secondary | ICD-10-CM | POA: Diagnosis not present

## 2017-12-31 DIAGNOSIS — I209 Angina pectoris, unspecified: Secondary | ICD-10-CM | POA: Diagnosis present

## 2017-12-31 DIAGNOSIS — Z888 Allergy status to other drugs, medicaments and biological substances status: Secondary | ICD-10-CM | POA: Diagnosis not present

## 2017-12-31 DIAGNOSIS — I2510 Atherosclerotic heart disease of native coronary artery without angina pectoris: Secondary | ICD-10-CM

## 2017-12-31 DIAGNOSIS — E782 Mixed hyperlipidemia: Secondary | ICD-10-CM | POA: Insufficient documentation

## 2017-12-31 DIAGNOSIS — Z833 Family history of diabetes mellitus: Secondary | ICD-10-CM | POA: Insufficient documentation

## 2017-12-31 DIAGNOSIS — E109 Type 1 diabetes mellitus without complications: Secondary | ICD-10-CM | POA: Diagnosis present

## 2017-12-31 DIAGNOSIS — R079 Chest pain, unspecified: Secondary | ICD-10-CM | POA: Diagnosis present

## 2017-12-31 DIAGNOSIS — I10 Essential (primary) hypertension: Secondary | ICD-10-CM | POA: Insufficient documentation

## 2017-12-31 HISTORY — PX: LEFT HEART CATH AND CORONARY ANGIOGRAPHY: CATH118249

## 2017-12-31 HISTORY — PX: CARDIAC CATHETERIZATION: SHX172

## 2017-12-31 LAB — GLUCOSE, CAPILLARY: GLUCOSE-CAPILLARY: 143 mg/dL — AB (ref 70–99)

## 2017-12-31 SURGERY — LEFT HEART CATH AND CORONARY ANGIOGRAPHY
Anesthesia: LOCAL

## 2017-12-31 MED ORDER — HEPARIN (PORCINE) IN NACL 1000-0.9 UT/500ML-% IV SOLN
INTRAVENOUS | Status: DC | PRN
  Administered 2017-12-31 (×2): 500 mL

## 2017-12-31 MED ORDER — VERAPAMIL HCL 2.5 MG/ML IV SOLN
INTRAVENOUS | Status: AC
  Filled 2017-12-31: qty 2

## 2017-12-31 MED ORDER — SODIUM CHLORIDE 0.9% FLUSH: 3.0000 mL | INTRAVENOUS | Status: DC | PRN

## 2017-12-31 MED ORDER — ONDANSETRON HCL 4 MG/2ML IJ SOLN: 4.0000 mg | Freq: Four times a day (QID) | INTRAMUSCULAR | Status: DC | PRN

## 2017-12-31 MED ORDER — FENTANYL CITRATE (PF) 100 MCG/2ML IJ SOLN
INTRAMUSCULAR | Status: DC | PRN
  Administered 2017-12-31: 25 ug via INTRAVENOUS

## 2017-12-31 MED ORDER — SODIUM CHLORIDE 0.9 % WEIGHT BASED INFUSION: 1.0000 mL/kg/h | INTRAVENOUS | Status: DC

## 2017-12-31 MED ORDER — HEPARIN SODIUM (PORCINE) 1000 UNIT/ML IJ SOLN
INTRAMUSCULAR | Status: AC
  Filled 2017-12-31: qty 1

## 2017-12-31 MED ORDER — VERAPAMIL HCL 2.5 MG/ML IV SOLN
INTRAVENOUS | Status: DC | PRN
  Administered 2017-12-31: 10 mL via INTRA_ARTERIAL

## 2017-12-31 MED ORDER — LIDOCAINE HCL (PF) 1 % IJ SOLN
INTRAMUSCULAR | Status: AC
  Filled 2017-12-31: qty 30

## 2017-12-31 MED ORDER — FENTANYL CITRATE (PF) 100 MCG/2ML IJ SOLN
INTRAMUSCULAR | Status: AC
  Filled 2017-12-31: qty 2

## 2017-12-31 MED ORDER — MIDAZOLAM HCL 2 MG/2ML IJ SOLN
INTRAMUSCULAR | Status: DC | PRN
  Administered 2017-12-31: 1 mg via INTRAVENOUS

## 2017-12-31 MED ORDER — SODIUM CHLORIDE 0.9% FLUSH: 3.0000 mL | Freq: Two times a day (BID) | INTRAVENOUS | Status: DC

## 2017-12-31 MED ORDER — IOHEXOL 350 MG/ML SOLN
INTRAVENOUS | Status: DC | PRN
  Administered 2017-12-31: 70 mL via INTRAVENOUS

## 2017-12-31 MED ORDER — SODIUM CHLORIDE 0.9 % WEIGHT BASED INFUSION: 1.0000 mL/kg/h | INTRAVENOUS | Status: AC

## 2017-12-31 MED ORDER — SODIUM CHLORIDE 0.9 % IV SOLN: 250.0000 mL | INTRAVENOUS | Status: DC | PRN

## 2017-12-31 MED ORDER — HEPARIN (PORCINE) IN NACL 1000-0.9 UT/500ML-% IV SOLN
INTRAVENOUS | Status: AC
  Filled 2017-12-31: qty 1000

## 2017-12-31 MED ORDER — SODIUM CHLORIDE 0.9 % WEIGHT BASED INFUSION
3.0000 mL/kg/h | INTRAVENOUS | Status: AC
  Administered 2017-12-31: 3 mL/kg/h via INTRAVENOUS

## 2017-12-31 MED ORDER — LIDOCAINE HCL (PF) 1 % IJ SOLN
INTRAMUSCULAR | Status: DC | PRN
  Administered 2017-12-31: 2 mL

## 2017-12-31 MED ORDER — ACETAMINOPHEN 325 MG PO TABS: 650.0000 mg | ORAL_TABLET | ORAL | Status: DC | PRN

## 2017-12-31 MED ORDER — ASPIRIN 81 MG PO CHEW: 81.0000 mg | CHEWABLE_TABLET | Freq: Once | ORAL | Status: DC

## 2017-12-31 MED ORDER — HEPARIN (PORCINE) IN NACL 2000-0.9 UNIT/L-% IV SOLN: INTRAVENOUS | Status: DC | PRN

## 2017-12-31 MED ORDER — HEPARIN SODIUM (PORCINE) 1000 UNIT/ML IJ SOLN
INTRAMUSCULAR | Status: DC | PRN
  Administered 2017-12-31: 3500 [IU] via INTRAVENOUS

## 2017-12-31 MED ORDER — MIDAZOLAM HCL 2 MG/2ML IJ SOLN
INTRAMUSCULAR | Status: AC
  Filled 2017-12-31: qty 2

## 2017-12-31 SURGICAL SUPPLY — 11 items
CATH 5FR JL3.5 JR4 ANG PIG MP (CATHETERS) ×1 IMPLANT
DEVICE RAD TR BAND REGULAR (VASCULAR PRODUCTS) ×1 IMPLANT
GLIDESHEATH SLEND SS 6F .021 (SHEATH) ×2 IMPLANT
GUIDEWIRE INQWIRE 1.5J.035X260 (WIRE) IMPLANT
INQWIRE 1.5J .035X260CM (WIRE) ×2
KIT HEART LEFT (KITS) ×2 IMPLANT
PACK CARDIAC CATHETERIZATION (CUSTOM PROCEDURE TRAY) ×2 IMPLANT
SHEATH PROBE COVER 6X72 (BAG) ×1 IMPLANT
SYR MEDRAD MARK V 150ML (SYRINGE) ×2 IMPLANT
TRANSDUCER W/STOPCOCK (MISCELLANEOUS) ×2 IMPLANT
TUBING CIL FLEX 10 FLL-RA (TUBING) ×2 IMPLANT

## 2017-12-31 NOTE — Discharge Instructions (Signed)

## 2017-12-31 NOTE — Interval H&P Note (Signed)
History and Physical Interval Note:  12/31/2017 9:11 AM  [name]  has presented today for surgery, with the diagnosis of angina  The various methods of treatment have been discussed with the patient and family. After consideration of risks, benefits and other options for treatment, the patient has consented to  Procedure(s): LEFT HEART CATH AND CORONARY ANGIOGRAPHY (N/A) as a surgical intervention .  The patient's history has been reviewed, patient examined, no change in status, stable for surgery.  I have reviewed the patient's chart and labs.  Questions were answered to the patient's satisfaction.   Cath Lab Visit (complete for each Cath Lab visit)  Clinical Evaluation Leading to the Procedure:   ACS: No.  Non-ACS:    Anginal Classification: CCS II  Anti-ischemic medical therapy: No Therapy  Non-Invasive Test Results: No non-invasive testing performed  Prior CABG: No previous CABG        [name] [facility] 12/31/2017 9:11 AM

## 2018-01-01 ENCOUNTER — Encounter (HOSPITAL_COMMUNITY): Payer: Self-pay | Admitting: Cardiology

## 2018-01-06 ENCOUNTER — Ambulatory Visit: Payer: BC Managed Care – PPO | Admitting: Cardiology

## 2018-01-21 ENCOUNTER — Ambulatory Visit: Payer: BC Managed Care – PPO | Admitting: Cardiology

## 2018-01-22 ENCOUNTER — Encounter: Payer: Self-pay | Admitting: Cardiology

## 2018-01-22 ENCOUNTER — Ambulatory Visit (INDEPENDENT_AMBULATORY_CARE_PROVIDER_SITE_OTHER): Payer: BC Managed Care – PPO | Admitting: Cardiology

## 2018-01-22 VITALS — BP 108/66 | HR 81 | Ht 62.0 in | Wt 144.0 lb

## 2018-01-22 DIAGNOSIS — I2510 Atherosclerotic heart disease of native coronary artery without angina pectoris: Secondary | ICD-10-CM

## 2018-01-22 DIAGNOSIS — Z720 Tobacco use: Secondary | ICD-10-CM

## 2018-01-22 DIAGNOSIS — E782 Mixed hyperlipidemia: Secondary | ICD-10-CM

## 2018-01-22 DIAGNOSIS — E088 Diabetes mellitus due to underlying condition with unspecified complications: Secondary | ICD-10-CM | POA: Diagnosis not present

## 2018-01-22 MED ORDER — VARENICLINE TARTRATE 0.5 MG PO TABS: 0.5000 mg | ORAL_TABLET | Freq: Two times a day (BID) | ORAL | 1 refills | Status: DC

## 2018-01-22 NOTE — Patient Instructions (Signed)
Medication Instructions:  Your physician has recommended you make the following change in your medication:   START: Chantix 0.5 mg daily  Labwork: None  Testing/Procedures: None  Follow-Up: Your physician recommends that you schedule a follow-up appointment in: 9 months   Any Other Special Instructions Will Be Listed Below (If Applicable).  Varenicline oral tablets What is this medicine? VARENICLINE (var EN i kleen) is used to help people quit smoking. It can reduce the symptoms caused by stopping smoking. It is used with a patient support program recommended by your physician. This medicine may be used for other purposes; ask your health care provider or pharmacist if you have questions. COMMON BRAND NAME(S): Chantix What should I tell my health care provider before I take this medicine? They need to know if you have any of these conditions: -bipolar disorder, depression, schizophrenia or other mental illness -heart disease -if you often drink alcohol -kidney disease -peripheral vascular disease -seizures -stroke -suicidal thoughts, plans, or attempt; a previous suicide attempt by you or a family member -an unusual or allergic reaction to varenicline, other medicines, foods, dyes, or preservatives -pregnant or trying to get pregnant -breast-feeding How should I use this medicine? Take this medicine by mouth after eating. Take with a full glass of water. Follow the directions on the prescription label. Take your doses at regular intervals. Do not take your medicine more often than directed. There are 3 ways you can use this medicine to help you quit smoking; talk to your health care professional to decide which plan is right for you: 1) you can choose a quit date and start this medicine 1 week before the quit date, or, 2) you can start taking this medicine before you choose a quit date, and then pick a quit date between day 8 and 35 days of treatment, or, 3) if you are not  sure that you are able or willing to quit smoking right away, start taking this medicine and slowly decrease the amount you smoke as directed by your health care professional with the goal of being cigarette-free by week 12 of treatment. Stick to your plan; ask about support groups or other ways to help you remain cigarette-free. If you are motivated to quit smoking and did not succeed during a previous attempt with this medicine for reasons other than side effects, or if you returned to smoking after this treatment, speak with your health care professional about whether another course of this medicine may be right for you. A special MedGuide will be given to you by the pharmacist with each prescription and refill. Be sure to read this information carefully each time. Talk to your pediatrician regarding the use of this medicine in children. This medicine is not approved for use in children. Overdosage: If you think you have taken too much of this medicine contact a poison control center or emergency room at once. NOTE: This medicine is only for you. Do not share this medicine with others. What if I miss a dose? If you miss a dose, take it as soon as you can. If it is almost time for your next dose, take only that dose. Do not take double or extra doses. What may interact with this medicine? -alcohol or any product that contains alcohol -insulin -other stop smoking aids -theophylline -warfarin This list may not describe all possible interactions. Give your health care provider a list of all the medicines, herbs, non-prescription drugs, or dietary supplements you use. Also tell them if  you smoke, drink alcohol, or use illegal drugs. Some items may interact with your medicine. What should I watch for while using this medicine? Visit your doctor or health care professional for regular check ups. Ask for ongoing advice and encouragement from your doctor or healthcare professional, friends, and family to  help you quit. If you smoke while on this medication, quit again Your mouth may get dry. Chewing sugarless gum or sucking hard candy, and drinking plenty of water may help. Contact your doctor if the problem does not go away or is severe. You may get drowsy or dizzy. Do not drive, use machinery, or do anything that needs mental alertness until you know how this medicine affects you. Do not stand or sit up quickly, especially if you are an older patient. This reduces the risk of dizzy or fainting spells. Sleepwalking can happen during treatment with this medicine, and can sometimes lead to behavior that is harmful to you, other people, or property. Stop taking this medicine and tell your doctor if you start sleepwalking or have other unusual sleep-related activity. Decrease the amount of alcoholic beverages that you drink during treatment with this medicine until you know if this medicine affects your ability to tolerate alcohol. Some people have experienced increased drunkenness (intoxication), unusual or sometimes aggressive behavior, or no memory of things that have happened (amnesia) during treatment with this medicine. The use of this medicine may increase the chance of suicidal thoughts or actions. Pay special attention to how you are responding while on this medicine. Any worsening of mood, or thoughts of suicide or dying should be reported to your health care professional right away. What side effects may I notice from receiving this medicine? Side effects that you should report to your doctor or health care professional as soon as possible: -allergic reactions like skin rash, itching or hives, swelling of the face, lips, tongue, or throat -acting aggressive, being angry or violent, or acting on dangerous impulses -breathing problems -changes in vision -chest pain or chest tightness -confusion, trouble speaking or understanding -new or worsening depression, anxiety, or panic attacks -extreme  increase in activity and talking (mania) -fast, irregular heartbeat -feeling faint or lightheaded, falls -fever -pain in legs when walking -problems with balance, talking, walking -redness, blistering, peeling or loosening of the skin, including inside the mouth -ringing in ears -seeing or hearing things that aren't there (hallucinations) -seizures -sleepwalking -sudden numbness or weakness of the face, arm or leg -thoughts about suicide or dying, or attempts to commit suicide -trouble passing urine or change in the amount of urine -unusual bleeding or bruising -unusually weak or tired Side effects that usually do not require medical attention (report to your doctor or health care professional if they continue or are bothersome): -constipation -headache -nausea, vomiting -strange dreams -stomach gas -trouble sleeping This list may not describe all possible side effects. Call your doctor for medical advice about side effects. You may report side effects to FDA at 1-800-FDA-1088. Where should I keep my medicine? Keep out of the reach of children. Store at room temperature between 15 and 30 degrees C (59 and 86 degrees F). Throw away any unused medicine after the expiration date. NOTE: This sheet is a summary. It may not cover all possible information. If you have questions about this medicine, talk to your doctor, pharmacist, or health care provider.  2018 Elsevier/Gold Standard (2014-12-28 16:14:23)    If you need a refill on your cardiac medications before your next appointment, please  call your pharmacy.

## 2018-01-22 NOTE — Progress Notes (Signed)
Cardiology Office Note:    Date:  01/22/2018   ID:  [name], DOB 26-Jul-1960, MRN [id number]  PCP:  [name], MD  Cardiologist:  [name], MD   Referring MD: [name], MD    ASSESSMENT:    1. Coronary artery disease involving native coronary artery of native heart without angina pectoris   2. Mixed hyperlipidemia   3. Diabetes mellitus due to underlying condition with complication, without long-term current use of insulin ([place])   4. Tobacco abuse    PLAN:    In order of problems listed above:  1. Secondary prevention stressed with the patient.  Importance of compliance with diet and medication stressed and she vocalized understanding.  Coronary angiography report was detailed to her.  Her lipids are followed by her primary care physician.  Importance of regular exercise stressed 2. I spent 5 minutes with the patient discussing solely about smoking. Smoking cessation was counseled. I suggested to the patient also different medications and pharmacological interventions. Patient is keen to try stopping and I prescribed chantix. He will get back to me if he needs any further assistance in this matter. 3. Patient will be seen in follow-up appointment in 6 months or earlier if the patient has any concerns    Medication Adjustments/Labs and Tests Ordered: Current medicines are reviewed at length with the patient today.  Concerns regarding medicines are outlined above.  No orders of the defined types were placed in this encounter.  Meds ordered this encounter  Medications  . varenicline (CHANTIX) 0.5 MG tablet    Sig: Take 1 tablet (0.5 mg total) by mouth 2 (two) times daily.    Dispense:  90 tablet    Refill:  1     No chief complaint on file.    History of Present Illness:    [name] is a 57 y.o. female.  Patient has history of essential hypertension, coronary artery disease, diabetes mellitus and smoking.  She underwent coronary angiography  because of significant symptoms unfortunately did not have any obstructive stenosis.  She denies any problems at this time and takes care of activities of daily living.  No chest pain orthopnea or PND.  Unfortunately she continues to smoke.  Past Medical History:  Diagnosis Date  . Arthritis    back   . Diabetes mellitus without complication (HCC)    Type 1- injections only- recent change to Toujeo with improved A1C reading.  . Trichotillomania    hx.     Past Surgical History:  Procedure Laterality Date  . APPENDECTOMY    . HIP OPEN REDUCTION Right    ORIF  . LEFT HEART CATH AND CORONARY ANGIOGRAPHY N/A 12/31/2017   Procedure: LEFT HEART CATH AND CORONARY ANGIOGRAPHY;  Surgeon: Martinique, Peter M, MD;  Location: [facility];  Service: Cardiovascular;  Laterality: N/A;  . LUMBAR LAMINECTOMY/DECOMPRESSION MICRODISCECTOMY Left 05/10/2014   Procedure: MICRODISCECTOMY LUMBAR DECOMPRESSION L5-S1 LEFT ;  Surgeon: Johnn Hai, MD;  Location: [facility];  Service: Orthopedics;  Laterality: Left;  . ORIF WRIST FRACTURE Right    retained hardware    Current Medications: Current Meds  Medication Sig  . ALPRAZolam (XANAX) 0.5 MG tablet Take 0.25-0.5 mg by mouth 3 (three) times daily as needed for anxiety.   Marland Kitchen aspirin EC 81 MG tablet Take 81 mg by mouth at bedtime.  Marland Kitchen atorvastatin (LIPITOR) 80 MG tablet Take 80 mg by mouth daily.   Marland Kitchen FLUoxetine (PROZAC)  40 MG capsule Take 80 mg by mouth daily.   Marland Kitchen glucose blood (PRECISION QID TEST) test strip by Misc.(Non-Drug; Combo Route) route.  Marland Kitchen HYDROcodone-acetaminophen (NORCO/VICODIN) 5-325 MG tablet Take 1 tablet by mouth every 6 (six) hours as needed for moderate pain.   Marland Kitchen insulin aspart (NOVOLOG) 100 UNIT/ML injection Inject 3-4 Units into the skin 3 (three) times daily before meals. Sliding scale  . insulin degludec (TRESIBA FLEXTOUCH) 100 UNIT/ML SOPN FlexTouch Pen Inject 36 Units into the skin daily.  . Insulin Pen Needle (B-D ULTRAFINE III SHORT  PEN) 31G X 8 MM MISC USE AS DIRECTED THREE TIMES A DAY WITH LEVEMIR AND NOVOLOG  . linaclotide (LINZESS) 290 MCG CAPS capsule Take 290 mcg by mouth daily.  . Liraglutide -Weight Management (SAXENDA) 18 MG/3ML SOPN Inject 0.4 mLs into the skin daily.   . methocarbamol (ROBAXIN) 500 MG tablet Take 1 tablet (500 mg total) by mouth 3 (three) times daily. (Patient taking differently: Take 500 mg by mouth every 8 (eight) hours as needed for muscle spasms. )  . nitroGLYCERIN (NITROSTAT) 0.4 MG SL tablet Place 1 tablet (0.4 mg total) under the tongue every 5 (five) minutes as needed.     Allergies:   Celebrex [celecoxib] and Mobic [meloxicam]   Social History   Socioeconomic History  . Marital status: Single    Spouse name: Not on file  . Number of children: Not on file  . Years of education: Not on file  . Highest education level: Not on file  Occupational History  . Not on file  Social Needs  . Financial resource strain: Not on file  . Food insecurity:    Worry: Not on file    Inability: Not on file  . Transportation needs:    Medical: Not on file    Non-medical: Not on file  Tobacco Use  . Smoking status: Current Every Day Smoker    Packs/day: 0.50    Years: 30.00    Pack years: 15.00    Types: Cigarettes  . Smokeless tobacco: Never Used  Substance and Sexual Activity  . Alcohol use: Yes    Comment: weekends - social  . Drug use: No  . Sexual activity: Not on file  Lifestyle  . Physical activity:    Days per week: Not on file    Minutes per session: Not on file  . Stress: Not on file  Relationships  . Social connections:    Talks on phone: Not on file    Gets together: Not on file    Attends religious service: Not on file    Active member of club or organization: Not on file    Attends meetings of clubs or organizations: Not on file    Relationship status: Not on file  Other Topics Concern  . Not on file  Social History Narrative  . Not on file     Family  History: The patient's family history includes Cancer in her mother; Diabetes in her father and mother; Heart disease in her father; Hypertension in her father.  ROS:   Please see the history of present illness.    All other systems reviewed and are negative.  EKGs/Labs/Other Studies Reviewed:    The following studies were reviewed today: Coronary angiography report was discussed with the patient.  She had mild nonobstructive coronary artery disease in the range of 20 to 30%.   Recent Labs: 12/24/2017: BUN 13; Creatinine, Ser 0.66; Hemoglobin 14.2; Platelets 248; Potassium 4.5; Sodium  143  Recent Lipid Panel No results found for: CHOL, TRIG, HDL, CHOLHDL, VLDL, LDLCALC, LDLDIRECT  Physical Exam:    VS:  BP 108/66 (BP Location: Right Arm, Patient Position: Sitting, Cuff Size: Normal)   Pulse 81   Ht 5\' 2"  (1.575 m)   Wt 144 lb (65.3 kg)   LMP 05/04/2008   SpO2 98%   BMI 26.34 kg/m     Wt Readings from Last 3 Encounters:  01/22/18 144 lb (65.3 kg)  12/31/17 140 lb (63.5 kg)  12/24/17 142 lb (64.4 kg)     GEN: Patient is in no acute distress HEENT: Normal NECK: No JVD; No carotid bruits LYMPHATICS: No lymphadenopathy CARDIAC: Hear sounds regular, 2/6 systolic murmur at the apex. RESPIRATORY:  Clear to auscultation without rales, wheezing or rhonchi  ABDOMEN: Soft, non-tender, non-distended MUSCULOSKELETAL:  No edema; No deformity  SKIN: Warm and dry NEUROLOGIC:  Alert and oriented x 3 PSYCHIATRIC:  Normal affect   Signed, [name], MD  01/22/2018 11:38 AM    [place]

## 2018-01-25 ENCOUNTER — Telehealth: Payer: Self-pay | Admitting: Cardiology

## 2018-01-25 NOTE — Telephone Encounter (Signed)
Pt states she was seen last week and needed clearance for surgery/didn't see anything in the note

## 2018-01-26 NOTE — Telephone Encounter (Signed)
Requested from Dr. Reather Littler office with [facility] that they send the form to the office via fax.   (P) [phone]

## 2018-01-27 NOTE — Telephone Encounter (Signed)
Successful clearance faxed to [facility].

## 2018-01-27 NOTE — Telephone Encounter (Signed)
Request was faxed to the office.

## 2018-01-28 DIAGNOSIS — M79672 Pain in left foot: Secondary | ICD-10-CM

## 2018-01-28 DIAGNOSIS — M79673 Pain in unspecified foot: Secondary | ICD-10-CM

## 2018-01-28 HISTORY — DX: Pain in left foot: M79.672

## 2018-01-28 HISTORY — DX: Pain in unspecified foot: M79.673

## 2018-03-16 ENCOUNTER — Ambulatory Visit: Payer: Self-pay | Admitting: Orthopedic Surgery

## 2018-04-05 ENCOUNTER — Ambulatory Visit: Payer: Self-pay | Admitting: Orthopedic Surgery

## 2018-04-05 NOTE — H&P (View-Only) (Signed)
[name] is an 57 y.o. female.   Chief Complaint: Left hip pain HPI: Visit For: Follow up Location: left; hip Severity: pain level 6/10 Notes: The patient has taken Hydrocodone for the pain. She would like another injection start of the job cannot take time off.  Past Medical History:  Diagnosis Date  . Arthritis    back   . Diabetes mellitus without complication (HCC)    Type 1- injections only- recent change to Toujeo with improved A1C reading.  . Trichotillomania    hx.     Past Surgical History:  Procedure Laterality Date  . APPENDECTOMY    . HIP OPEN REDUCTION Right    ORIF  . LEFT HEART CATH AND CORONARY ANGIOGRAPHY N/A 12/31/2017   Procedure: LEFT HEART CATH AND CORONARY ANGIOGRAPHY;  Surgeon: Martinique, Peter M, MD;  Location: [facility];  Service: Cardiovascular;  Laterality: N/A;  . LUMBAR LAMINECTOMY/DECOMPRESSION MICRODISCECTOMY Left 05/10/2014   Procedure: MICRODISCECTOMY LUMBAR DECOMPRESSION L5-S1 LEFT ;  Surgeon: Johnn Hai, MD;  Location: [facility];  Service: Orthopedics;  Laterality: Left;  . ORIF WRIST FRACTURE Right    retained hardware    Family History  Problem Relation Age of Onset  . Diabetes Mother   . Cancer Mother   . Hypertension Father   . Heart disease Father   . Diabetes Father    Social History:  reports that she has been smoking cigarettes. She has a 15.00 pack-year smoking history. She has never used smokeless tobacco. She reports that she drinks alcohol. She reports that she does not use drugs.  Allergies:  Allergies  Allergen Reactions  . Celebrex [Celecoxib] Swelling    Face swelling  . Mobic [Meloxicam] Swelling    Face swelling    Medications ALPRAZolam 0.5 mg tablet atorvastatin 80 mg tablet cyanocobalamin (vit B-12) 1,000 mcg/mL injection solution FLUoxetine Linzess 290 mcg capsule Norco 5 mg-325 mg tablet NovoLOG Flexpen U-100 Insulin aspart 100 unit/mL (3 mL) subcutaneous Robaxin 500 mg tablet Saxenda 3  mg/0.5 mL (18 mg/3 mL) subcutaneous pen injector Tresiba FlexTouch U-200 insulin 200 unit/mL (3 mL) subcutaneous pen  Review of Systems  Constitutional: Negative.   HENT: Negative.   Eyes: Negative.   Respiratory: Negative.   Cardiovascular: Negative.   Gastrointestinal: Negative.   Genitourinary: Negative.   Musculoskeletal: Positive for back pain and joint pain.  Skin: Negative.   Neurological: Negative.   Psychiatric/Behavioral: Negative.     Last menstrual period 05/04/2008. Physical Exam  Constitutional: She is oriented to person, place, and time. She appears well-developed and well-nourished.  HENT:  Head: Normocephalic.  Eyes: Pupils are equal, round, and reactive to light.  Neck: Normal range of motion.  Cardiovascular: Normal rate.  Respiratory: Effort normal.  GI: Soft.  Musculoskeletal:  Patient is a 57 year old female.  Patient exquisitely tender over the left greater trochanter. Straight leg raise is negative. She has pain radiating down her right leg  Neurological: She is alert and oriented to person, place, and time.  Skin: Skin is warm and dry.     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 Patient has trochanteric bursitis on the left. 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  Also underlying scoliosis.  Assessment/Plan Patient has trochanteric bursitis on the left. 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  Also underlying scoliosis.  We discussed as we did previously the maximum number of injections per year. Again  she is in a certain situation new job does not want to take off I discussed repeating the injection with a lower dose and we have use lower dose injections in the past we discussed possibility of weakening rupturing of the tendon compromising the repair etc. to though still like to proceed with a giving her inability go up  and down stairs and pending clearance for her surgical procedure.  Re-discussed surgical excision of the bursa.  Monitor blood glucose supplement accordingly.  She decided she would like to proceed with surgery. We discussed surgery itself as well as risks, complications, alternatives and post-op protocols. She desires to proceed.  Plan excision trochanteric bursa left hip  Cecilie Kicks., PA-C for Dr. Tonita Cong 04/05/2018, 9:04 AM

## 2018-04-05 NOTE — H&P (Signed)
[name] is an 57 y.o. female.   Chief Complaint: Left hip pain HPI: Visit For: Follow up Location: left; hip Severity: pain level 6/10 Notes: The patient has taken Hydrocodone for the pain. She would like another injection start of the job cannot take time off.  Past Medical History:  Diagnosis Date  . Arthritis    back   . Diabetes mellitus without complication (HCC)    Type 1- injections only- recent change to Toujeo with improved A1C reading.  . Trichotillomania    hx.     Past Surgical History:  Procedure Laterality Date  . APPENDECTOMY    . HIP OPEN REDUCTION Right    ORIF  . LEFT HEART CATH AND CORONARY ANGIOGRAPHY N/A 12/31/2017   Procedure: LEFT HEART CATH AND CORONARY ANGIOGRAPHY;  Surgeon: Martinique, Peter M, MD;  Location: [facility];  Service: Cardiovascular;  Laterality: N/A;  . LUMBAR LAMINECTOMY/DECOMPRESSION MICRODISCECTOMY Left 05/10/2014   Procedure: MICRODISCECTOMY LUMBAR DECOMPRESSION L5-S1 LEFT ;  Surgeon: Johnn Hai, MD;  Location: [facility];  Service: Orthopedics;  Laterality: Left;  . ORIF WRIST FRACTURE Right    retained hardware    Family History  Problem Relation Age of Onset  . Diabetes Mother   . Cancer Mother   . Hypertension Father   . Heart disease Father   . Diabetes Father    Social History:  reports that she has been smoking cigarettes. She has a 15.00 pack-year smoking history. She has never used smokeless tobacco. She reports that she drinks alcohol. She reports that she does not use drugs.  Allergies:  Allergies  Allergen Reactions  . Celebrex [Celecoxib] Swelling    Face swelling  . Mobic [Meloxicam] Swelling    Face swelling    Medications ALPRAZolam 0.5 mg tablet atorvastatin 80 mg tablet cyanocobalamin (vit B-12) 1,000 mcg/mL injection solution FLUoxetine Linzess 290 mcg capsule Norco 5 mg-325 mg tablet NovoLOG Flexpen U-100 Insulin aspart 100 unit/mL (3 mL) subcutaneous Robaxin 500 mg tablet Saxenda 3  mg/0.5 mL (18 mg/3 mL) subcutaneous pen injector Tresiba FlexTouch U-200 insulin 200 unit/mL (3 mL) subcutaneous pen  Review of Systems  Constitutional: Negative.   HENT: Negative.   Eyes: Negative.   Respiratory: Negative.   Cardiovascular: Negative.   Gastrointestinal: Negative.   Genitourinary: Negative.   Musculoskeletal: Positive for back pain and joint pain.  Skin: Negative.   Neurological: Negative.   Psychiatric/Behavioral: Negative.     Last menstrual period 05/04/2008. Physical Exam  Constitutional: She is oriented to person, place, and time. She appears well-developed and well-nourished.  HENT:  Head: Normocephalic.  Eyes: Pupils are equal, round, and reactive to light.  Neck: Normal range of motion.  Cardiovascular: Normal rate.  Respiratory: Effort normal.  GI: Soft.  Musculoskeletal:  Patient is a 57 year old female.  Patient exquisitely tender over the left greater trochanter. Straight leg raise is negative. She has pain radiating down her right leg  Neurological: She is alert and oriented to person, place, and time.  Skin: Skin is warm and dry.     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 Patient has trochanteric bursitis on the left. 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  Also underlying scoliosis.  Assessment/Plan Patient has trochanteric bursitis on the left. 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  Also underlying scoliosis.  We discussed as we did previously the maximum number of injections per year. Again  she is in a certain situation new job does not want to take off I discussed repeating the injection with a lower dose and we have use lower dose injections in the past we discussed possibility of weakening rupturing of the tendon compromising the repair etc. to though still like to proceed with a giving her inability go up  and down stairs and pending clearance for her surgical procedure.  Re-discussed surgical excision of the bursa.  Monitor blood glucose supplement accordingly.  She decided she would like to proceed with surgery. We discussed surgery itself as well as risks, complications, alternatives and post-op protocols. She desires to proceed.  Plan excision trochanteric bursa left hip  Cecilie Kicks., PA-C for Dr. Tonita Cong 04/05/2018, 9:04 AM

## 2018-04-07 NOTE — Progress Notes (Signed)
01/22/2018- noted in Epic-office note from Dr. Geraldo Pitter  12/31/2017- noted in [facility]- left heart cath and coronary angiography  12/25/2017- noted in Epic-CXR  12/24/2017- noted in [place]

## 2018-04-07 NOTE — Patient Instructions (Addendum)
[name]  04/07/2018   Your procedure is scheduled on: Wednesday 04/14/2018  Report to [facility] Main  Entrance              Report to admitting at   0900 AM    Call this number if you have problems the morning of surgery [phone]  How to Manage Your Diabetes Before and After Surgery  Why is it important to control my blood sugar before and after surgery? . Improving blood sugar levels before and after surgery helps healing and can limit problems. . A way of improving blood sugar control is eating a healthy diet by: o  Eating less sugar and carbohydrates o  Increasing activity/exercise o  Talking with your doctor about reaching your blood sugar goals . High blood sugars (greater than 180 mg/dL) can raise your risk of infections and slow your recovery, so you will need to focus on controlling your diabetes during the weeks before surgery. . Make sure that the doctor who takes care of your diabetes knows about your planned surgery including the date and location.  How do I manage my blood sugar before surgery? . Check your blood sugar at least 4 times a day, starting 2 days before surgery, to make sure that the level is not too high or low. o Check your blood sugar the morning of your surgery when you wake up and every 2 hours until you get to the Short Stay unit. . If your blood sugar is less than 70 mg/dL, you will need to treat for low blood sugar: o Do not take insulin. o Treat a low blood sugar (less than 70 mg/dL) with  cup of clear juice (cranberry or apple), 4 glucose tablets, OR glucose gel. o Recheck blood sugar in 15 minutes after treatment (to make sure it is greater than 70 mg/dL). If your blood sugar is not greater than 70 mg/dL on recheck, call [phone] for further instructions. . Report your blood sugar to the short stay nurse when you get to Short Stay.  . If you are admitted to the hospital after surgery: o Your blood sugar  will be checked by the staff and you will probably be given insulin after surgery (instead of oral diabetes medicines) to make sure you have good blood sugar levels. o The goal for blood sugar control after surgery is 80-180 mg/dL.   WHAT DO I DO ABOUT MY DIABETES MEDICATION?        The day before surgery, Take Tresiba insulin as usual in the morning.  . Do not take oral diabetes medicines (pills) the morning of surgery.       . THE MORNING OF SURGERY, take   28   units of  Tresiba        insulin.  Marland Kitchen .The morning of surgery, follow the instructions below for the Novolog Insulin for sliding scale!  . If your CBG is greater than 220 mg/dL, you may take  of your sliding scale  . (correction) dose of insulin.       Remember: Do not eat food or drink liquids :After Midnight.             BRUSH YOUR TEETH MORNING OF SURGERY AND RINSE YOUR MOUTH OUT, NO CHEWING GUM CANDY OR MINTS.     Take these medicines the morning of surgery with A SIP OF WATER: none  DO NOT TAKE ANY DIABETIC MEDICATIONS DAY OF YOUR SURGERY                               You may not have any metal on your body including hair pins and              piercings  Do not wear jewelry, make-up, lotions, powders or perfumes, deodorant             Do not wear nail polish.  Do not shave  48 hours prior to surgery.               Do not bring valuables to the hospital. [place].  Contacts, dentures or bridgework may not be worn into surgery.  Leave suitcase in the car. After surgery it may be brought to your room.                   Please read over the following fact sheets you were given: _____________________________________________________________________             [facility] - Preparing for Surgery Before surgery, you can play an important role.  Because skin is not sterile, your skin needs to be as free of germs as possible.  You can reduce the  number of germs on your skin by washing with CHG (chlorahexidine gluconate) soap before surgery.  CHG is an antiseptic cleaner which kills germs and bonds with the skin to continue killing germs even after washing. Please DO NOT use if you have an allergy to CHG or antibacterial soaps.  If your skin becomes reddened/irritated stop using the CHG and inform your nurse when you arrive at Short Stay. Do not shave (including legs and underarms) for at least 48 hours prior to the first CHG shower.  You may shave your face/neck. Please follow these instructions carefully:  1.  Shower with CHG Soap the night before surgery and the  morning of Surgery.  2.  If you choose to wash your hair, wash your hair first as usual with your  normal  shampoo.  3.  After you shampoo, rinse your hair and body thoroughly to remove the  shampoo.                           4.  Use CHG as you would any other liquid soap.  You can apply chg directly  to the skin and wash                       Gently with a scrungie or clean washcloth.  5.  Apply the CHG Soap to your body ONLY FROM THE NECK DOWN.   Do not use on face/ open                           Wound or open sores. Avoid contact with eyes, ears mouth and genitals (private parts).                       Wash face,  Genitals (private parts) with your normal soap.             6.  Wash thoroughly, paying special attention to the  area where your surgery  will be performed.  7.  Thoroughly rinse your body with warm water from the neck down.  8.  DO NOT shower/wash with your normal soap after using and rinsing off  the CHG Soap.                9.  Pat yourself dry with a clean towel.            10.  Wear clean pajamas.            11.  Place clean sheets on your bed the night of your first shower and do not  sleep with pets. Day of Surgery : Do not apply any lotions/deodorants the morning of surgery.  Please wear clean clothes to the hospital/surgery center.  FAILURE TO FOLLOW  THESE INSTRUCTIONS MAY RESULT IN THE CANCELLATION OF YOUR SURGERY PATIENT SIGNATURE_________________________________  NURSE SIGNATURE__________________________________  ________________________________________________________________________   [name]  An incentive spirometer is a tool that can help keep your lungs clear and active. This tool measures how well you are filling your lungs with each breath. Taking long deep breaths may help reverse or decrease the chance of developing breathing (pulmonary) problems (especially infection) following:  A long period of time when you are unable to move or be active. BEFORE THE PROCEDURE   If the spirometer includes an indicator to show your best effort, your nurse or respiratory therapist will set it to a desired goal.  If possible, sit up straight or lean slightly forward. Try not to slouch.  Hold the incentive spirometer in an upright position. INSTRUCTIONS FOR USE  1. Sit on the edge of your bed if possible, or sit up as far as you can in bed or on a chair. 2. Hold the incentive spirometer in an upright position. 3. Breathe out normally. 4. Place the mouthpiece in your mouth and seal your lips tightly around it. 5. Breathe in slowly and as deeply as possible, raising the piston or the ball toward the top of the column. 6. Hold your breath for 3-5 seconds or for as long as possible. Allow the piston or ball to fall to the bottom of the column. 7. Remove the mouthpiece from your mouth and breathe out normally. 8. Rest for a few seconds and repeat Steps 1 through 7 at least 10 times every 1-2 hours when you are awake. Take your time and take a few normal breaths between deep breaths. 9. The spirometer may include an indicator to show your best effort. Use the indicator as a goal to work toward during each repetition. 10. After each set of 10 deep breaths, practice coughing to be sure your lungs are clear. If you have an incision  (the cut made at the time of surgery), support your incision when coughing by placing a pillow or rolled up towels firmly against it. Once you are able to get out of bed, walk around indoors and cough well. You may stop using the incentive spirometer when instructed by your caregiver.  RISKS AND COMPLICATIONS  Take your time so you do not get dizzy or light-headed.  If you are in pain, you may need to take or ask for pain medication before doing incentive spirometry. It is harder to take a deep breath if you are having pain. AFTER USE  Rest and breathe slowly and easily.  It can be helpful to keep track of a log of your progress. Your caregiver can provide you with a  simple table to help with this. If you are using the spirometer at home, follow these instructions: [place] IF:   You are having difficultly using the spirometer.  You have trouble using the spirometer as often as instructed.  Your pain medication is not giving enough relief while using the spirometer.  You develop fever of 100.5 F (38.1 C) or higher. SEEK IMMEDIATE MEDICAL CARE IF:   You cough up bloody sputum that had not been present before.  You develop fever of 102 F (38.9 C) or greater.  You develop worsening pain at or near the incision site. MAKE SURE YOU:   Understand these instructions.  Will watch your condition.  Will get help right away if you are not doing well or get worse. Document Released: 08/25/2006 Document Revised: 07/07/2011 Document Reviewed: 10/26/2006 [facility] Patient Information 2014 [place], [place].   ________________________________________________________________________

## 2018-04-09 ENCOUNTER — Encounter (HOSPITAL_COMMUNITY): Payer: Self-pay

## 2018-04-09 ENCOUNTER — Other Ambulatory Visit: Payer: Self-pay

## 2018-04-09 ENCOUNTER — Encounter (HOSPITAL_COMMUNITY)
Admission: RE | Admit: 2018-04-09 | Discharge: 2018-04-09 | Disposition: A | Discharge status: Home / Self Care | Payer: BC Managed Care – PPO | Source: Ambulatory Visit | Attending: Specialist | Admitting: Specialist

## 2018-04-09 DIAGNOSIS — Z01812 Encounter for preprocedural laboratory examination: Secondary | ICD-10-CM | POA: Diagnosis present

## 2018-04-09 LAB — BASIC METABOLIC PANEL
ANION GAP: 8 (ref 5–15)
BUN: 10 mg/dL (ref 6–20)
CO2: 26 mmol/L (ref 22–32)
Calcium: 8.9 mg/dL (ref 8.9–10.3)
Chloride: 105 mmol/L (ref 98–111)
Creatinine, Ser: 0.62 mg/dL (ref 0.44–1.00)
GFR calc Af Amer: >60 mL/min (ref >60)
GFR calc non Af Amer: >60 mL/min (ref >60)
Glucose, Bld: 171 mg/dL — ABNORMAL HIGH (ref 70–99)
Potassium: 4.2 mmol/L (ref 3.5–5.1)
Sodium: 139 mmol/L (ref 135–145)

## 2018-04-09 LAB — CBC
HCT: 42.5 % (ref 36.0–46.0)
Hemoglobin: 13.8 g/dL (ref 12.0–15.0)
MCH: 31.7 pg (ref 26.0–34.0)
MCHC: 32.5 g/dL (ref 30.0–36.0)
MCV: 97.7 fL (ref 80.0–100.0)
Platelets: 273 10*3/uL (ref 150–400)
RBC: 4.35 MIL/uL (ref 3.87–5.11)
RDW: 13.3 % (ref 11.5–15.5)
WBC: 6.3 10*3/uL (ref 4.0–10.5)
nRBC: 0 % (ref 0.0–0.2)

## 2018-04-09 LAB — GLUCOSE, CAPILLARY: Glucose-Capillary: 158 mg/dL — ABNORMAL HIGH (ref 70–99)

## 2018-04-09 LAB — HEMOGLOBIN A1C
Hgb A1c MFr Bld: 6.8 % — ABNORMAL HIGH (ref 4.8–5.6)
Mean Plasma Glucose: 148.46 mg/dL

## 2018-04-14 ENCOUNTER — Other Ambulatory Visit: Payer: Self-pay

## 2018-04-14 ENCOUNTER — Ambulatory Visit (HOSPITAL_COMMUNITY)
Admission: RE | Admit: 2018-04-14 | Discharge: 2018-04-15 | Disposition: A | Discharge status: Home / Self Care | Payer: BC Managed Care – PPO | Attending: Specialist | Admitting: Specialist

## 2018-04-14 ENCOUNTER — Encounter (HOSPITAL_COMMUNITY): Payer: Self-pay

## 2018-04-14 ENCOUNTER — Encounter (HOSPITAL_COMMUNITY)
Admission: RE | Disposition: A | Discharge status: Home / Self Care | Payer: Self-pay | Source: Home / Self Care | Attending: Specialist

## 2018-04-14 ENCOUNTER — Ambulatory Visit (HOSPITAL_COMMUNITY): Payer: BC Managed Care – PPO | Admitting: Anesthesiology

## 2018-04-14 DIAGNOSIS — F633 Trichotillomania: Secondary | ICD-10-CM | POA: Insufficient documentation

## 2018-04-14 DIAGNOSIS — S76122A Laceration of left quadriceps muscle, fascia and tendon, initial encounter: Secondary | ICD-10-CM | POA: Insufficient documentation

## 2018-04-14 DIAGNOSIS — M479 Spondylosis, unspecified: Secondary | ICD-10-CM | POA: Diagnosis not present

## 2018-04-14 DIAGNOSIS — I2510 Atherosclerotic heart disease of native coronary artery without angina pectoris: Secondary | ICD-10-CM | POA: Diagnosis not present

## 2018-04-14 DIAGNOSIS — E109 Type 1 diabetes mellitus without complications: Secondary | ICD-10-CM | POA: Insufficient documentation

## 2018-04-14 DIAGNOSIS — M419 Scoliosis, unspecified: Secondary | ICD-10-CM | POA: Diagnosis not present

## 2018-04-14 DIAGNOSIS — Z7982 Long term (current) use of aspirin: Secondary | ICD-10-CM | POA: Insufficient documentation

## 2018-04-14 DIAGNOSIS — X58XXXA Exposure to other specified factors, initial encounter: Secondary | ICD-10-CM | POA: Diagnosis not present

## 2018-04-14 DIAGNOSIS — Z888 Allergy status to other drugs, medicaments and biological substances status: Secondary | ICD-10-CM | POA: Insufficient documentation

## 2018-04-14 DIAGNOSIS — F419 Anxiety disorder, unspecified: Secondary | ICD-10-CM | POA: Diagnosis not present

## 2018-04-14 DIAGNOSIS — M7072 Other bursitis of hip, left hip: Secondary | ICD-10-CM

## 2018-04-14 DIAGNOSIS — M7062 Trochanteric bursitis, left hip: Secondary | ICD-10-CM | POA: Insufficient documentation

## 2018-04-14 DIAGNOSIS — Z794 Long term (current) use of insulin: Secondary | ICD-10-CM | POA: Diagnosis not present

## 2018-04-14 DIAGNOSIS — Z886 Allergy status to analgesic agent status: Secondary | ICD-10-CM | POA: Insufficient documentation

## 2018-04-14 DIAGNOSIS — Z9889 Other specified postprocedural states: Secondary | ICD-10-CM | POA: Diagnosis not present

## 2018-04-14 DIAGNOSIS — M7071 Other bursitis of hip, right hip: Secondary | ICD-10-CM | POA: Diagnosis present

## 2018-04-14 DIAGNOSIS — Z79899 Other long term (current) drug therapy: Secondary | ICD-10-CM | POA: Insufficient documentation

## 2018-04-14 DIAGNOSIS — F17210 Nicotine dependence, cigarettes, uncomplicated: Secondary | ICD-10-CM | POA: Diagnosis not present

## 2018-04-14 HISTORY — PX: OTHER SURGICAL HISTORY: SHX169

## 2018-04-14 HISTORY — DX: Other bursitis of hip, right hip: M70.71

## 2018-04-14 HISTORY — PX: EXCISION/RELEASE BURSA HIP: SHX5014

## 2018-04-14 LAB — GLUCOSE, CAPILLARY
GLUCOSE-CAPILLARY: 226 mg/dL — AB (ref 70–99)
Glucose-Capillary: 134 mg/dL — ABNORMAL HIGH (ref 70–99)
Glucose-Capillary: 143 mg/dL — ABNORMAL HIGH (ref 70–99)
Glucose-Capillary: 326 mg/dL — ABNORMAL HIGH (ref 70–99)

## 2018-04-14 SURGERY — RELEASE, BURSA, TROCHANTERIC
Anesthesia: Spinal | Site: Hip | Laterality: Left

## 2018-04-14 MED ORDER — DEXAMETHASONE SODIUM PHOSPHATE 10 MG/ML IJ SOLN
INTRAMUSCULAR | Status: DC | PRN
  Administered 2018-04-14: 10 mg via INTRAVENOUS

## 2018-04-14 MED ORDER — ONDANSETRON HCL 4 MG/2ML IJ SOLN: 4.0000 mg | Freq: Four times a day (QID) | INTRAMUSCULAR | Status: DC | PRN

## 2018-04-14 MED ORDER — PROPOFOL 500 MG/50ML IV EMUL
INTRAVENOUS | Status: DC | PRN
  Administered 2018-04-14: 50 ug/kg/min via INTRAVENOUS

## 2018-04-14 MED ORDER — METHOCARBAMOL 500 MG IVPB - SIMPLE MED
500.0000 mg | Freq: Four times a day (QID) | INTRAVENOUS | Status: DC | PRN
  Administered 2018-04-14: 500 mg via INTRAVENOUS
  Filled 2018-04-14: qty 50

## 2018-04-14 MED ORDER — ONDANSETRON HCL 4 MG/2ML IJ SOLN
4.0000 mg | Freq: Once | INTRAMUSCULAR | Status: AC
  Administered 2018-04-14: 4 mg via INTRAVENOUS

## 2018-04-14 MED ORDER — CEFAZOLIN SODIUM-DEXTROSE 2-4 GM/100ML-% IV SOLN
INTRAVENOUS | Status: AC
  Filled 2018-04-14: qty 100

## 2018-04-14 MED ORDER — POTASSIUM CHLORIDE IN NACL 20-0.9 MEQ/L-% IV SOLN
INTRAVENOUS | Status: DC
  Administered 2018-04-14: 22:00:00 via INTRAVENOUS
  Administered 2018-04-14: 50 mL/h via INTRAVENOUS
  Filled 2018-04-14: qty 1000

## 2018-04-14 MED ORDER — LIDOCAINE 2% (20 MG/ML) 5 ML SYRINGE
INTRAMUSCULAR | Status: AC
  Filled 2018-04-14: qty 5

## 2018-04-14 MED ORDER — NITROGLYCERIN 0.4 MG SL SUBL: 0.4000 mg | SUBLINGUAL_TABLET | SUBLINGUAL | Status: DC | PRN

## 2018-04-14 MED ORDER — ROCURONIUM BROMIDE 10 MG/ML (PF) SYRINGE
PREFILLED_SYRINGE | INTRAVENOUS | Status: AC
  Filled 2018-04-14: qty 10

## 2018-04-14 MED ORDER — ONDANSETRON HCL 4 MG PO TABS: 4.0000 mg | ORAL_TABLET | Freq: Four times a day (QID) | ORAL | Status: DC | PRN

## 2018-04-14 MED ORDER — CEFAZOLIN SODIUM-DEXTROSE 1-4 GM/50ML-% IV SOLN
1.0000 g | Freq: Four times a day (QID) | INTRAVENOUS | Status: AC
  Administered 2018-04-14: 1 g via INTRAVENOUS
  Filled 2018-04-14: qty 50

## 2018-04-14 MED ORDER — FENTANYL CITRATE (PF) 250 MCG/5ML IJ SOLN
INTRAMUSCULAR | Status: DC | PRN
  Administered 2018-04-14 (×2): 50 ug via INTRAVENOUS

## 2018-04-14 MED ORDER — METHOCARBAMOL 500 MG PO TABS
500.0000 mg | ORAL_TABLET | Freq: Four times a day (QID) | ORAL | Status: DC | PRN
  Administered 2018-04-14: 500 mg via ORAL
  Filled 2018-04-14: qty 1

## 2018-04-14 MED ORDER — INSULIN ASPART 100 UNIT/ML ~~LOC~~ SOLN: 0.0000 [IU] | Freq: Three times a day (TID) | SUBCUTANEOUS | Status: DC

## 2018-04-14 MED ORDER — ACETAMINOPHEN 325 MG PO TABS: 325.0000 mg | ORAL_TABLET | Freq: Four times a day (QID) | ORAL | Status: DC | PRN

## 2018-04-14 MED ORDER — SODIUM CHLORIDE 0.9 % IV SOLN
INTRAVENOUS | Status: AC
  Filled 2018-04-14: qty 500000

## 2018-04-14 MED ORDER — HYDROMORPHONE HCL 1 MG/ML IJ SOLN: 0.2500 mg | INTRAMUSCULAR | Status: DC | PRN

## 2018-04-14 MED ORDER — PHENYLEPHRINE HCL 10 MG/ML IJ SOLN
INTRAMUSCULAR | Status: AC
  Filled 2018-04-14: qty 1

## 2018-04-14 MED ORDER — PROPOFOL 10 MG/ML IV BOLUS
INTRAVENOUS | Status: AC
  Filled 2018-04-14: qty 40

## 2018-04-14 MED ORDER — FENTANYL CITRATE (PF) 250 MCG/5ML IJ SOLN
INTRAMUSCULAR | Status: AC
  Filled 2018-04-14: qty 5

## 2018-04-14 MED ORDER — METOCLOPRAMIDE HCL 5 MG PO TABS: 5.0000 mg | ORAL_TABLET | Freq: Three times a day (TID) | ORAL | Status: DC | PRN

## 2018-04-14 MED ORDER — INSULIN ASPART 100 UNIT/ML ~~LOC~~ SOLN
0.0000 [IU] | Freq: Every day | SUBCUTANEOUS | Status: DC
  Administered 2018-04-14: 4 [IU] via SUBCUTANEOUS

## 2018-04-14 MED ORDER — INSULIN ASPART 100 UNIT/ML ~~LOC~~ SOLN
0.0000 [IU] | Freq: Three times a day (TID) | SUBCUTANEOUS | Status: DC
  Administered 2018-04-14: 2 [IU] via SUBCUTANEOUS
  Administered 2018-04-15: 3 [IU] via SUBCUTANEOUS

## 2018-04-14 MED ORDER — MIDAZOLAM HCL 2 MG/2ML IJ SOLN
INTRAMUSCULAR | Status: AC
  Filled 2018-04-14: qty 2

## 2018-04-14 MED ORDER — LACTATED RINGERS IV SOLN
INTRAVENOUS | Status: DC
  Administered 2018-04-14: 1000 mL via INTRAVENOUS
  Administered 2018-04-14: 15:00:00 via INTRAVENOUS

## 2018-04-14 MED ORDER — BUPIVACAINE-EPINEPHRINE 0.25% -1:200000 IJ SOLN
INTRAMUSCULAR | Status: DC | PRN
  Administered 2018-04-14: 30 mL

## 2018-04-14 MED ORDER — METHOCARBAMOL 500 MG PO TABS: 500.0000 mg | ORAL_TABLET | Freq: Four times a day (QID) | ORAL | 0 refills | Status: DC | PRN

## 2018-04-14 MED ORDER — OXYCODONE HCL 5 MG PO TABS
10.0000 mg | ORAL_TABLET | ORAL | Status: DC | PRN
  Administered 2018-04-14: 15 mg via ORAL
  Administered 2018-04-15: 10 mg via ORAL
  Filled 2018-04-14 (×2): qty 3

## 2018-04-14 MED ORDER — PROPOFOL 10 MG/ML IV BOLUS
INTRAVENOUS | Status: DC | PRN
  Administered 2018-04-14: 20 mg via INTRAVENOUS

## 2018-04-14 MED ORDER — ALPRAZOLAM 0.5 MG PO TABS
0.5000 mg | ORAL_TABLET | Freq: Every day | ORAL | Status: DC
  Administered 2018-04-14: 0.5 mg via ORAL
  Filled 2018-04-14: qty 1

## 2018-04-14 MED ORDER — BUPIVACAINE IN DEXTROSE 0.75-8.25 % IT SOLN
INTRATHECAL | Status: DC | PRN
  Administered 2018-04-14: 1.8 mL via INTRATHECAL

## 2018-04-14 MED ORDER — FLUOXETINE HCL 20 MG PO CAPS
80.0000 mg | ORAL_CAPSULE | Freq: Every day | ORAL | Status: DC
  Administered 2018-04-14: 80 mg via ORAL
  Filled 2018-04-14: qty 4

## 2018-04-14 MED ORDER — METOCLOPRAMIDE HCL 5 MG/ML IJ SOLN: 5.0000 mg | Freq: Three times a day (TID) | INTRAMUSCULAR | Status: DC | PRN

## 2018-04-14 MED ORDER — PROMETHAZINE HCL 25 MG/ML IJ SOLN: 6.2500 mg | INTRAMUSCULAR | Status: DC | PRN

## 2018-04-14 MED ORDER — POLYETHYLENE GLYCOL 3350 17 G PO PACK: 17.0000 g | PACK | Freq: Every day | ORAL | Status: DC | PRN

## 2018-04-14 MED ORDER — EZETIMIBE 10 MG PO TABS
10.0000 mg | ORAL_TABLET | Freq: Every day | ORAL | Status: DC
  Administered 2018-04-14: 10 mg via ORAL
  Filled 2018-04-14: qty 1

## 2018-04-14 MED ORDER — METHOCARBAMOL 500 MG IVPB - SIMPLE MED
INTRAVENOUS | Status: AC
  Administered 2018-04-14: 500 mg via INTRAVENOUS
  Filled 2018-04-14: qty 50

## 2018-04-14 MED ORDER — OXYCODONE HCL 5 MG PO TABS
5.0000 mg | ORAL_TABLET | ORAL | Status: DC | PRN
  Administered 2018-04-14: 5 mg via ORAL
  Administered 2018-04-15: 10 mg via ORAL
  Filled 2018-04-14: qty 2
  Filled 2018-04-14: qty 1

## 2018-04-14 MED ORDER — FOLIC ACID 1 MG PO TABS
1.0000 mg | ORAL_TABLET | Freq: Every day | ORAL | Status: DC
  Administered 2018-04-14: 1 mg via ORAL
  Filled 2018-04-14: qty 1

## 2018-04-14 MED ORDER — DOCUSATE SODIUM 100 MG PO CAPS: 100.0000 mg | ORAL_CAPSULE | Freq: Two times a day (BID) | ORAL | 0 refills | Status: DC

## 2018-04-14 MED ORDER — SODIUM CHLORIDE 0.9 % IV SOLN
INTRAVENOUS | Status: DC | PRN
  Administered 2018-04-14: 500 mL

## 2018-04-14 MED ORDER — SUGAMMADEX SODIUM 200 MG/2ML IV SOLN
INTRAVENOUS | Status: AC
  Filled 2018-04-14: qty 2

## 2018-04-14 MED ORDER — OXYCODONE HCL 5 MG PO TABS: 5.0000 mg | ORAL_TABLET | Freq: Once | ORAL | Status: DC | PRN

## 2018-04-14 MED ORDER — DEXAMETHASONE SODIUM PHOSPHATE 10 MG/ML IJ SOLN
INTRAMUSCULAR | Status: AC
  Filled 2018-04-14: qty 1

## 2018-04-14 MED ORDER — OXYCODONE HCL 5 MG/5ML PO SOLN
5.0000 mg | Freq: Once | ORAL | Status: DC | PRN
  Filled 2018-04-14: qty 5

## 2018-04-14 MED ORDER — ONDANSETRON HCL 4 MG/2ML IJ SOLN
INTRAMUSCULAR | Status: AC
  Filled 2018-04-14: qty 2

## 2018-04-14 MED ORDER — PHENYLEPHRINE 40 MCG/ML (10ML) SYRINGE FOR IV PUSH (FOR BLOOD PRESSURE SUPPORT)
PREFILLED_SYRINGE | INTRAVENOUS | Status: DC | PRN
  Administered 2018-04-14 (×2): 80 ug via INTRAVENOUS

## 2018-04-14 MED ORDER — MIDAZOLAM HCL 2 MG/2ML IJ SOLN
INTRAMUSCULAR | Status: DC | PRN
  Administered 2018-04-14: 2 mg via INTRAVENOUS

## 2018-04-14 MED ORDER — LINACLOTIDE 145 MCG PO CAPS
290.0000 ug | ORAL_CAPSULE | Freq: Every day | ORAL | Status: DC
  Administered 2018-04-14: 290 ug via ORAL
  Filled 2018-04-14: qty 2

## 2018-04-14 MED ORDER — CEFAZOLIN SODIUM-DEXTROSE 2-4 GM/100ML-% IV SOLN
2.0000 g | INTRAVENOUS | Status: AC
  Administered 2018-04-14: 2 g via INTRAVENOUS

## 2018-04-14 MED ORDER — OXYCODONE HCL 5 MG PO TABS: 5.0000 mg | ORAL_TABLET | ORAL | 0 refills | Status: AC | PRN

## 2018-04-14 MED ORDER — BUPIVACAINE-EPINEPHRINE (PF) 0.25% -1:200000 IJ SOLN
INTRAMUSCULAR | Status: AC
  Filled 2018-04-14: qty 30

## 2018-04-14 MED ORDER — DOCUSATE SODIUM 100 MG PO CAPS
100.0000 mg | ORAL_CAPSULE | Freq: Two times a day (BID) | ORAL | Status: DC
  Administered 2018-04-14 – 2018-04-15 (×2): 100 mg via ORAL
  Filled 2018-04-14 (×2): qty 1

## 2018-04-14 MED ORDER — PROPOFOL 10 MG/ML IV BOLUS
INTRAVENOUS | Status: AC
  Filled 2018-04-14: qty 20

## 2018-04-14 MED ORDER — HYDROMORPHONE HCL 1 MG/ML IJ SOLN: 0.5000 mg | INTRAMUSCULAR | Status: DC | PRN

## 2018-04-14 MED ORDER — SODIUM CHLORIDE 0.9 % IV SOLN
INTRAVENOUS | Status: DC | PRN
  Administered 2018-04-14: 50 ug/min via INTRAVENOUS

## 2018-04-14 SURGICAL SUPPLY — 31 items
BAG SPEC THK2 15X12 ZIP CLS (MISCELLANEOUS)
BAG ZIPLOCK 12X15 (MISCELLANEOUS) IMPLANT
CLOSURE WOUND 1/2 X4 (GAUZE/BANDAGES/DRESSINGS) ×2
CLOTH 2% CHLOROHEXIDINE 3PK (PERSONAL CARE ITEMS) ×3 IMPLANT
COVER SURGICAL LIGHT HANDLE (MISCELLANEOUS) ×3 IMPLANT
COVER WAND RF STERILE (DRAPES) IMPLANT
DRAPE ORTHO SPLIT 77X108 STRL (DRAPES) ×6
DRAPE SURG ORHT 6 SPLT 77X108 (DRAPES) ×2 IMPLANT
DRAPE U-SHAPE 47X51 STRL (DRAPES) ×3 IMPLANT
DRSG AQUACEL AG ADV 3.5X 6 (GAUZE/BANDAGES/DRESSINGS) ×3 IMPLANT
DURAPREP 26ML APPLICATOR (WOUND CARE) ×3 IMPLANT
ELECT REM PT RETURN 15FT ADLT (MISCELLANEOUS) ×3 IMPLANT
GLOVE BIOGEL PI IND STRL 7.5 (GLOVE) ×1 IMPLANT
GLOVE BIOGEL PI IND STRL 8 (GLOVE) ×1 IMPLANT
GLOVE BIOGEL PI INDICATOR 7.5 (GLOVE) ×2
GLOVE BIOGEL PI INDICATOR 8 (GLOVE) ×2
GLOVE SURG SS PI 7.5 STRL IVOR (GLOVE) ×6 IMPLANT
GLOVE SURG SS PI 8.0 STRL IVOR (GLOVE) ×3 IMPLANT
GOWN STRL REUS W/TWL XL LVL3 (GOWN DISPOSABLE) ×6 IMPLANT
KIT BASIN OR (CUSTOM PROCEDURE TRAY) ×3 IMPLANT
MANIFOLD NEPTUNE II (INSTRUMENTS) ×3 IMPLANT
PACK TOTAL JOINT (CUSTOM PROCEDURE TRAY) ×3 IMPLANT
PROTECTOR NERVE ULNAR (MISCELLANEOUS) ×3 IMPLANT
STAPLER VISISTAT (STAPLE) IMPLANT
STRIP CLOSURE SKIN 1/2X4 (GAUZE/BANDAGES/DRESSINGS) ×2 IMPLANT
SUT ETHIBOND NAB CT1 #1 30IN (SUTURE) ×3 IMPLANT
SUT MNCRL AB 4-0 PS2 18 (SUTURE) IMPLANT
SUT VIC AB 1 CT1 27 (SUTURE) ×3
SUT VIC AB 1 CT1 27XBRD ANTBC (SUTURE) IMPLANT
SUT VIC AB 2-0 CT1 27 (SUTURE) ×3
SUT VIC AB 2-0 CT1 TAPERPNT 27 (SUTURE) ×1 IMPLANT

## 2018-04-14 NOTE — Evaluation (Signed)
Physical Therapy Evaluation Patient Details Name: [name] MRN: [id number] DOB: Oct 04, 1960 Today's Date: 04/14/2018   History of Present Illness  57 yo female s/p excision of L trochanteric bursa, repair of vastus lateralis tear on 04/14/18. PMH includes OA, DM I, R hip ORIF, L heart cath and coronary angiography, lumbar laminectomy/decompression/microdiscectomy, ORIF R wrist fracture.   Clinical Impression   Pt presents with LE weakness/instability following surgery suspected due to spinal L>R, decreased knowledge of precautions, difficulty performing mobility tasks, and decreased knowledge of crutch use. Pt to benefit from acute PT to address deficits. Upon standing, pt stating her gluteal region was still numb and presented with LE instability. Pt able to transfer via stand pivot to [facility] with min assist (pt soiled with urine upon arrival to room). Pt not ready to ambulate this session. Pt instructed in ankle pumps for circulation to perform this evening. PT to progress mobility as tolerated, and will continue to follow acutely.      Follow Up Recommendations Follow surgeon's recommendation for DC plan and follow-up therapies;Supervision for mobility/OOB    Equipment Recommendations  Crutches(crutches delivered to pt room by Ortho tech )    Recommendations for Other Services       Precautions / Restrictions Precautions Precautions: Fall Restrictions Weight Bearing Restrictions: Yes LLE Weight Bearing: Partial weight bearing LLE Partial Weight Bearing Percentage or Pounds: 50%      Mobility  Bed Mobility Overal bed mobility: Needs Assistance Bed Mobility: Supine to Sit     Supine to sit: Min assist;HOB elevated     General bed mobility comments: Min assist for LLE translation, trunk assist. Upon sitting EOB, PT realized pt was soiled with urine. Pt without foley catheter. Once pt was on [facility], PT and PT aide changed bedding.   Transfers Overall transfer level: Needs  assistance Equipment used: Crutches Transfers: Sit to/from [facility] Sit to Stand: Min assist;From elevated surface Stand pivot transfers: Min assist;From elevated surface       General transfer comment: Pt insistent that she wanted to use crutches, and has experience with crutch use. Pt required cuing for standing, instructed to hold and push up on hand grip of single crutch. Pt preferred to place crutch in her L hand, PT emphasized WB precautions. Once standing, pt instructed in applying crutch to axillary region and getting other crutch from PT. Upon standing, pt with initial unsteadiness, pt encouraged to sit EOB to reset. Pt more steady with second stand, but when attempting to ambulate, pt with LE instability. Prior to instability, PT provided verbal cuing on sequencing and placement of crutches during ambulation, while maintaining WB precautions. Pt not ready for ambulation, performed stand pivot to [facility] for toileting and transferred back with assistance for steadying.    Ambulation/Gait Ambulation/Gait assistance: (NT - pt unstable in LEs with standing and stand pivot)              Stairs            Wheelchair Mobility    Modified Rankin (Stroke Patients Only)       Balance Overall balance assessment: Needs assistance Sitting-balance support: No upper extremity supported Sitting balance-Leahy Scale: Normal     Standing balance support: Bilateral upper extremity supported Standing balance-Leahy Scale: Poor Standing balance comment: Pt requires AD and PT for steadying  Pertinent Vitals/Pain Pain Assessment: No/denies pain    Home Living Family/patient expects to be discharged to:: Private residence Living Arrangements: Spouse/significant other;Children Available Help at Discharge: Family;Available PRN/intermittently Type of Home: House Home Access: Stairs to enter Entrance Stairs-Rails: Right Entrance  Stairs-Number of Steps: 6 Home Layout: One level Home Equipment: None Additional Comments: Pt is adamant that she does not want to use RW     Prior Function Level of Independence: Independent               Hand Dominance   Dominant Hand: Right    Extremity/Trunk Assessment   Upper Extremity Assessment Upper Extremity Assessment: Overall WFL for tasks assessed    Lower Extremity Assessment Lower Extremity Assessment: Overall WFL for tasks assessed;LLE deficits/detail LLE Deficits / Details: suspected post-surgical hip weakness; able to perform ankle pumps, quad set, SLR with lift assist for supine to sit  LLE Sensation: decreased light touch(Pt reported some tingling of bilateral feet, and after attempting ambulation pt stated that her gluteal region was numb. When asked sitting EOB, pt states it was not numb )    Cervical / Trunk Assessment Cervical / Trunk Assessment: Normal  Communication   Communication: No difficulties  Cognition Arousal/Alertness: Awake/alert Behavior During Therapy: WFL for tasks assessed/performed Overall Cognitive Status: Within Functional Limits for tasks assessed                                        General Comments      Exercises     Assessment/Plan    PT Assessment Patient needs continued PT services  PT Problem List Decreased strength;Pain;Decreased activity tolerance;Decreased knowledge of use of DME;Decreased balance;Decreased mobility;Decreased safety awareness;Decreased knowledge of precautions       PT Treatment Interventions DME instruction;Therapeutic activities;Gait training;Therapeutic exercise;Patient/family education;Stair training;Balance training;Functional mobility training    PT Goals (Current goals can be found in the Care Plan section)  Acute Rehab PT Goals Patient Stated Goal: go home  PT Goal Formulation: With patient Time For Goal Achievement: 04/21/18 Potential to Achieve Goals: Good     Frequency Min 5X/week   Barriers to discharge        Co-evaluation               AM-PAC PT "6 Clicks" Mobility  Outcome Measure Help needed turning from your back to your side while in a flat bed without using bedrails?: A Little Help needed moving from lying on your back to sitting on the side of a flat bed without using bedrails?: A Little Help needed moving to and from a bed to a chair (including a wheelchair)?: A Little Help needed standing up from a chair using your arms (e.g., wheelchair or bedside chair)?: A Little Help needed to walk in hospital room?: A Lot Help needed climbing 3-5 steps with a railing? : A Lot 6 Click Score: 16    End of Session Equipment Utilized During Treatment: Gait belt;Oxygen(O2 reapplied after session, pt satting at 89% after PT on room air ) Activity Tolerance: Other (comment);No increased pain(Pt with LE instability, gluteal numbness) Patient left: in bed;with bed alarm set;with call bell/phone within reach;with SCD's reapplied Nurse Communication: Mobility status PT Visit Diagnosis: Difficulty in walking, not elsewhere classified (R26.2);Unsteadiness on feet (R26.81)    Time: 1730-1805 PT Time Calculation (min) (ACUTE ONLY): 35 min   Charges:   PT Evaluation $PT Eval Low Complexity:  1 Low PT Treatments $Therapeutic Activity: 8-22 mins       Julien Girt, PT Acute Rehabilitation Services Pager [phone]  Office [phone]    Roxine Caddy D Elonda Husky 04/14/2018, 7:24 PM

## 2018-04-14 NOTE — Interval H&P Note (Signed)
History and Physical Interval Note:  04/14/2018 10:50 AM  [name]  has presented today for surgery, with the diagnosis of Left hip trochanteric bursitis  The various methods of treatment have been discussed with the patient and family. After consideration of risks, benefits and other options for treatment, the patient has consented to  Procedure(s) with comments: Excision trochanteric bursa left hip (Left) - 60 mins as a surgical intervention .  The patient's history has been reviewed, patient examined, no change in status, stable for surgery.  I have reviewed the patient's chart and labs.  Questions were answered to the patient's satisfaction.     [name]

## 2018-04-14 NOTE — Anesthesia Preprocedure Evaluation (Addendum)
Anesthesia Evaluation  Patient identified by MRN, date of birth, ID band Patient awake    Reviewed: Allergy & Precautions, NPO status , Patient's Chart, lab work & pertinent test results  Airway Mallampati: II  TM Distance: >3 FB Neck ROM: Full    Dental no notable dental hx.    Pulmonary COPD, Current Smoker,    Pulmonary exam normal breath sounds clear to auscultation       Cardiovascular + CAD  Normal cardiovascular exam Rhythm:Regular Rate:Normal  ECG: NSR, rate 76  Sees cardiologist  CATH Ost LAD lesion is 30% stenosed. The left ventricular systolic function is normal. LV end diastolic pressure is normal. The left ventricular ejection fraction is 55-65% by visual estimate. 1. Mild nonobstructive CAD at the ostium of the LAD- 30%  2. Otherwise normal coronary anatomy 3. Normal LV function 4. Normal LVEDP Plan: risk factor modification. Recommend Aspirin 81mg  daily for moderate CAD.   Neuro/Psych PSYCHIATRIC DISORDERS Anxiety negative neurological ROS     GI/Hepatic negative GI ROS, Neg liver ROS,   Endo/Other  diabetes, Insulin Dependent  Renal/GU negative Renal ROS     Musculoskeletal negative musculoskeletal ROS (+)   Abdominal   Peds  Hematology HLD   Anesthesia Other Findings Left hip trochanteric bursitis  Reproductive/Obstetrics                            Anesthesia Physical Anesthesia Plan  ASA: III  Anesthesia Plan: Spinal   Post-op Pain Management:    Induction: Intravenous  PONV Risk Score and Plan: 1 and Propofol infusion, Treatment may vary due to age or medical condition, Midazolam and Ondansetron  Airway Management Planned: Natural Airway  Additional Equipment:   Intra-op Plan:   Post-operative Plan:   Informed Consent: I have reviewed the patients History and Physical, chart, labs and discussed the procedure including the risks, benefits and  alternatives for the proposed anesthesia with the patient or authorized representative who has indicated his/her understanding and acceptance.   Dental advisory given  Plan Discussed with: CRNA  Anesthesia Plan Comments:        Anesthesia Quick Evaluation

## 2018-04-14 NOTE — Anesthesia Procedure Notes (Signed)
Date/Time: 04/14/2018 11:27 AM Performed by: Sharlette Dense, CRNA Oxygen Delivery Method: Simple face mask

## 2018-04-14 NOTE — Progress Notes (Signed)
Patient c/o mild nausea.  Dr. Bernette Redbird notified of complaints and CBG results.  Zofran given as ordered.

## 2018-04-14 NOTE — Anesthesia Postprocedure Evaluation (Signed)
Anesthesia Post Note  Patient: [name]  Procedure(s) Performed: Excision trochanteric bursa left hip (Left Hip)     Patient location during evaluation: PACU Anesthesia Type: Spinal Level of consciousness: oriented and awake and alert Pain management: pain level controlled Vital Signs Assessment: post-procedure vital signs reviewed and stable Respiratory status: spontaneous breathing, respiratory function stable and patient connected to nasal cannula oxygen Cardiovascular status: blood pressure returned to baseline and stable Postop Assessment: no headache, no backache, no apparent nausea or vomiting and spinal receding Anesthetic complications: no    Last Vitals:  Vitals:   04/14/18 1445 04/14/18 1500  BP: 101/63 (!) 107/58  Pulse: 63 65  Resp: 13 14  Temp:  (!) 36.4 C  SpO2: 99% 100%    Last Pain:  Vitals:   04/14/18 1500  TempSrc:   PainSc: 0-No pain                 Grigor Lipschutz P Merridy Pascoe

## 2018-04-14 NOTE — Discharge Instructions (Signed)
Partial weight bearing left leg- use crutches or walker for assistance Ice to left hip 20-30 min at a time 3x/day to reduce swelling Dressing to remain in place left hip. May shower with dressing in place but do not soak/submerge. Pat dry after showering. Dressing may remain in place until follow up in 2 weeks Call office to see Dr. Tonita Cong 2 weeks after surgery to remove stitches

## 2018-04-14 NOTE — Transfer of Care (Signed)
Immediate Anesthesia Transfer of Care Note  Patient: [name]  Procedure(s) Performed: Excision trochanteric bursa left hip (Left Hip)  Patient Location: PACU  Anesthesia Type:Spinal  Level of Consciousness: awake, alert  and oriented  Airway & Oxygen Therapy: Patient Spontanous Breathing and Patient connected to nasal cannula oxygen  Post-op Assessment: Report given to RN and Post -op Vital signs reviewed and stable  Post vital signs: Reviewed and stable  Last Vitals:  Vitals Value Taken Time  BP 83/46 04/14/2018 12:46 PM  Temp    Pulse 68 04/14/2018 12:48 PM  Resp 15 04/14/2018 12:48 PM  SpO2 96 % 04/14/2018 12:48 PM  Vitals shown include unvalidated device data.  Last Pain:  Vitals:   04/14/18 0918  TempSrc: Oral  PainSc:       Patients Stated Pain Goal: 4 (70/14/10 3013)  Complications: No apparent anesthesia complications

## 2018-04-14 NOTE — Brief Op Note (Signed)
04/14/2018  12:23 PM  PATIENT:  [name]  57 y.o. female  PRE-OPERATIVE DIAGNOSIS:  Left hip trochanteric bursitis  POST-OPERATIVE DIAGNOSIS:  Left hip trochanteric bursitis  PROCEDURE:  Procedure(s) with comments: Excision trochanteric bursa left hip (Left) - 60 mins  SURGEON:  Surgeon(s) and Role:    Susa Day, MD - Primary  PHYSICIAN ASSISTANT:   ASSISTANTS: Biss[place]   ANESTHESIA:   general  EBL:  min   BLOOD ADMINISTERED:none  DRAINS: none   LOCAL MEDICATIONS USED:  MARCAINE     SPECIMEN:  No Specimen  DISPOSITION OF SPECIMEN:  N/A  COUNTS:  YES  TOURNIQUET:  * No tourniquets in log *  DICTATION: .Other Dictation: Dictation Number [id number]  PLAN OF CARE: Admit for overnight observation  PATIENT DISPOSITION:  PACU - hemodynamically stable.   Delay start of Pharmacological VTE agent (>24hrs) due to surgical blood loss or risk of bleeding: no

## 2018-04-14 NOTE — Anesthesia Procedure Notes (Signed)
Spinal  Patient location during procedure: OR Start time: 04/14/2018 11:30 AM End time: 04/14/2018 11:35 AM Staffing Anesthesiologist: Murvin Natal, MD Performed: anesthesiologist  Preanesthetic Checklist Completed: patient identified, surgical consent, pre-op evaluation, timeout performed, IV checked, risks and benefits discussed and monitors and equipment checked Spinal Block Patient position: sitting Prep: DuraPrep Patient monitoring: cardiac monitor, continuous pulse ox and blood pressure Approach: left paramedian Location: L4-5 Injection technique: single-shot Needle Needle type: Pencan  Needle gauge: 24 G Needle length: 9 cm Assessment Sensory level: T10 Additional Notes Functioning IV was confirmed and monitors were applied. Sterile prep and drape, including hand hygiene and sterile gloves were used. The patient was positioned and the spine was prepped. The skin was anesthetized with lidocaine.  Free flow of clear CSF was obtained prior to injecting local anesthetic into the CSF.  The spinal needle aspirated freely following injection.  The needle was carefully withdrawn.  The patient tolerated the procedure well.

## 2018-04-14 NOTE — Op Note (Signed)
[name], [name] MEDICAL RECORD [id number] ACCOUNT [id number] DATE OF BIRTH:February 02, 1961 FACILITY: [facility] LOCATION: [facility] PHYSICIAN:Marcanthony Sleight Windy Kalata, MD  OPERATIVE REPORT  DATE OF PROCEDURE:  04/14/2018  PREOPERATIVE DIAGNOSIS:  Refractory trochanteric bursitis of the left hip.  POSTOPERATIVE DIAGNOSIS:  Refractory trochanteric bursitis of the left hip, longitudinal tear in the vastus lateralis.  PROCEDURE PERFORMED:   1.  Mini open excision of the greater trochanteric bursitis.   2.  Repair vastus lateralis tear.  ANESTHESIA:  General.  ASSISTANT:  Lacie Draft, PA  HISTORY:  This is a very pleasant 57 year old female who has had refractory trochanteric bursitis mechanical back pain and radicular pain.  Normal lumbar decompression, residual bursa pain.  She has had multiple injections with temporary relief of her  pain.  MRI indicating no tear, moderate arthritis of the hip joint.  We discussed continued living with her symptoms, stretching, activity modification.  This was without avail.  She had met her maximum of corticosteroid injections we felt to the hip.   We therefore discussed options including open resection of the bursa and relaxation of her tensor fascia lata tendon.  Risks and benefits discussed including bleeding, infection, damage to neurovascular structures, no change in symptoms, worsening  symptoms, DVT, PE, anesthetic complications, etc.  TECHNIQUE:  With the patient in supine position, after induction of adequate spinal anesthesia, was placed in right lateral decubitus position.  All bony prominences well padded.  The right peritrochanteric region was prepped and draped in the usual  sterile fashion.  Preoperative marking was placed at the point of maximal tenderness right over the greater tuberosity in its mid portion.  An incision was made in the skin overlying that.  Subcutaneous tissue was dissected with electrocautery to achieve  hemostasis.  The  tensor fascia lata was identified in line with the skin incision right over the prominence of the greater tuberosity.  We gently incised this with a small incision and lifted up on it and divided the tendon proximally and distally  approximately 5 cm in each direction.  Hypertrophic bursa was noted just beneath that this was excised, digitally lysed and palpated.  No protuberance was noted.  There was also a small rent in the vastus lateralis longitudinally.  I put some #1 Vicryl  interrupted figure-of-eight sutures in that.  It was approximately a cm in length.  Internal and external rotation of the hip revealed no adhesions, residual bursa.  [place]oplay revealed hypertrophic.  Following this, I reapproximated the tensor fascia with #1  Vicryl interrupted figure-of-eight sutures.  Then, with an abduction pillow removed and the knees together with slight tension, released with a small fenestration incisions parallel to the incision approximately 2 cm anteriorly and posteriorly just for  relaxation incisions.  Approximately 8 of these were performed.  This was designed to give her at least some decrease in the tightness of the tensor fascia over the bursa.  I then copiously irrigated.  No active bleeding, subcu with multiple layers of  2-0 and skin with Monocryl.  Sterile dressing applied, placed supine and transported to the recovery room in satisfactory condition.  The patient tolerated the procedure well.  No complications.  Assistant Lacie Draft, [place].  Minimal blood loss.  AN/NUANCE  D:04/14/2018 T:04/14/2018 JOB:[id number]

## 2018-04-15 ENCOUNTER — Encounter (HOSPITAL_COMMUNITY): Payer: Self-pay | Admitting: Specialist

## 2018-04-15 DIAGNOSIS — M7062 Trochanteric bursitis, left hip: Secondary | ICD-10-CM | POA: Diagnosis not present

## 2018-04-15 LAB — GLUCOSE, CAPILLARY: GLUCOSE-CAPILLARY: 192 mg/dL — AB (ref 70–99)

## 2018-04-15 NOTE — Discharge Summary (Signed)
Patient ID: [name] MRN: [id number] DOB/AGE: 08-11-1960 57 y.o.  Admit date: 04/14/2018 Discharge date: 04/15/2018  Admission Diagnoses:  Active Problems:   Bursitis of both hips   Discharge Diagnoses:  Same  Past Medical History:  Diagnosis Date  . Arthritis    back   . Diabetes mellitus without complication (HCC)    Type 1- injections only- recent change to [place] with improved A1C reading.  . Trichotillomania    hx.     Surgeries: Procedure(s): Excision trochanteric bursa left hip on 04/14/2018   Consultants:   Discharged Condition: Improved  Hospital Course: [name] is an 57 y.o. female who was admitted 04/14/2018 for operative treatment of<principal problem not specified>. Patient has severe unremitting pain that affects sleep, daily activities, and work/hobbies. After pre-op clearance the patient was taken to the operating room on 04/14/2018 and underwent  Procedure(s): Excision trochanteric bursa left hip.    Patient was given perioperative antibiotics:  Anti-infectives (From admission, onward)   Start     Dose/Rate Route Frequency Ordered Stop   04/14/18 1800  ceFAZolin (ANCEF) IVPB 1 g/50 mL premix     1 g 100 mL/hr over 30 Minutes Intravenous Every 6 hours 04/14/18 1545 04/14/18 1738   04/14/18 1159  polymyxin B 500,000 Units, bacitracin 50,000 Units in sodium chloride 0.9 % 500 mL irrigation  Status:  Discontinued       As needed 04/14/18 1200 04/14/18 1243   04/14/18 0902  ceFAZolin (ANCEF) 2-4 GM/100ML-% IVPB    Note to Pharmacy:  Waldron Session   : cabinet override      04/14/18 0902 04/14/18 1137   04/14/18 0900  ceFAZolin (ANCEF) IVPB 2g/100 mL premix     2 g 200 mL/hr over 30 Minutes Intravenous On call to O.R. 04/14/18 0859 04/14/18 1147       Patient was given sequential compression devices, early ambulation, and chemoprophylaxis to prevent DVT.  Patient benefited maximally from hospital stay and there were no complications.     Recent vital signs:  Patient Vitals for the past 24 hrs:  BP Temp Temp src Pulse Resp SpO2 Height Weight  04/15/18 0214 106/68 98.2 F (36.8 C) - 65 - 93 % - -  04/14/18 2049 (!) 101/59 98.3 F (36.8 C) - 72 - 94 % - -  04/14/18 1932 (!) 110/54 97.7 F (36.5 C) Oral 77 - 96 % - -  04/14/18 1912 (!) 105/59 98.1 F (36.7 C) - 72 16 96 % - -  04/14/18 1524 (!) 106/58 97.6 F (36.4 C) Oral 66 14 100 % 5\' 2"  (1.575 m) 65.3 kg  04/14/18 1500 (!) 107/58 (!) 97.5 F (36.4 C) - 65 14 100 % - -  04/14/18 1445 101/63 - - 63 13 99 % - -  04/14/18 1430 (!) 91/58 - - 67 16 99 % - -  04/14/18 1415 (!) 94/57 - - 65 13 97 % - -  04/14/18 1400 (!) 91/57 - - 62 15 99 % - -  04/14/18 1345 (!) 86/54 - - 63 13 97 % - -  04/14/18 1330 (!) 84/52 - - 65 13 98 % - -  04/14/18 1315 (!) 83/45 - - 62 14 95 % - -  04/14/18 1300 (!) 84/44 - - 65 12 95 % - -  04/14/18 1245 (!) 83/46 97.7 F (36.5 C) - 64 13 98 % - -  04/14/18 0918 104/65 98 F (36.7 C) Oral 73 16 96 % - -  04/14/18 0917 - - - - - - 5\' 2"  (1.575 m) 65.3 kg     Recent laboratory studies: No results for input(s): WBC, HGB, HCT, PLT, NA, K, CL, CO2, BUN, CREATININE, GLUCOSE, INR, CALCIUM in the last 72 hours.  Invalid input(s): PT, 2   Discharge Medications:   Allergies as of 04/15/2018      Reactions   Celebrex [celecoxib] Swelling, Other (See Comments)   Face swelling   Mobic [meloxicam] Swelling, Other (See Comments)   Face swelling      Medication List    STOP taking these medications   HYDROcodone-acetaminophen 5-325 MG tablet Commonly known as:  NORCO/VICODIN     TAKE these medications   ALPRAZolam 0.5 MG tablet Commonly known as:  XANAX Take 0.5 mg by mouth at bedtime.   aspirin EC 81 MG tablet Take 81 mg by mouth at bedtime.   atorvastatin 80 MG tablet Commonly known as:  LIPITOR Take 80 mg by mouth at bedtime.   cyanocobalamin 1000 MCG/ML injection Commonly known as:  (VITAMIN B-12) Inject 1,000 mcg into the  muscle every 14 (fourteen) days.   docusate sodium 100 MG capsule Commonly known as:  COLACE Take 1 capsule (100 mg total) by mouth 2 (two) times daily.   ezetimibe 10 MG tablet Commonly known as:  ZETIA Take 10 mg by mouth at bedtime.   FLUoxetine 40 MG capsule Commonly known as:  PROZAC Take 80 mg by mouth at bedtime.   folic acid 1 MG tablet Commonly known as:  FOLVITE Take 1 mg by mouth at bedtime.   insulin aspart 100 UNIT/ML injection Commonly known as:  novoLOG Inject 2-5 Units into the skin 3 (three) times daily as needed (for BS > 200 per sliding scale).   LINZESS 290 MCG Caps capsule Generic drug:  linaclotide Take 290 mcg by mouth at bedtime.   methocarbamol 500 MG tablet Commonly known as:  ROBAXIN Take 1 tablet (500 mg total) by mouth every 6 (six) hours as needed for muscle spasms. What changed:    when to take this  reasons to take this   nitroGLYCERIN 0.4 MG SL tablet Commonly known as:  NITROSTAT Place 1 tablet (0.4 mg total) under the tongue every 5 (five) minutes as needed.   oxyCODONE 5 MG immediate release tablet Commonly known as:  ROXICODONE Take 1-2 tablets (5-10 mg total) by mouth every 4 (four) hours as needed.   SAXENDA 18 MG/3ML Sopn Generic drug:  Liraglutide -Weight Management Inject 3 mg into the skin daily.   TRESIBA FLEXTOUCH 200 UNIT/ML Sopn Generic drug:  Insulin Degludec Inject 36 Units into the skin daily.   varenicline 0.5 MG tablet Commonly known as:  CHANTIX Take 1 tablet (0.5 mg total) by mouth 2 (two) times daily.       Diagnostic Studies: No results found.  Disposition: Discharge disposition: 01-Home or Self Care       Discharge Instructions    Call MD / Call 911   Complete by:  As directed    If you experience chest pain or shortness of breath, CALL 911 and be transported to the hospital emergency room.  If you develope a fever above 101 F, pus (white drainage) or increased drainage or redness at the  wound, or calf pain, call your surgeon's office.   Call MD / Call 911   Complete by:  As directed    If you experience chest pain or shortness of breath, CALL 911 and be  transported to the hospital emergency room.  If you develope a fever above 101 F, pus (white drainage) or increased drainage or redness at the wound, or calf pain, call your surgeon's office.   Constipation Prevention   Complete by:  As directed    Drink plenty of fluids.  Prune juice may be helpful.  You may use a stool softener, such as Colace (over the counter) 100 mg twice a day.  Use MiraLax (over the counter) for constipation as needed.   Constipation Prevention   Complete by:  As directed    Drink plenty of fluids.  Prune juice may be helpful.  You may use a stool softener, such as Colace (over the counter) 100 mg twice a day.  Use MiraLax (over the counter) for constipation as needed.   Diet - low sodium heart healthy   Complete by:  As directed    Diet - low sodium heart healthy   Complete by:  As directed    Increase activity slowly as tolerated   Complete by:  As directed    Increase activity slowly as tolerated   Complete by:  As directed       Follow-up Information    Susa Day, MD Follow up in 2 week(s).   Specialty:  Orthopedic Surgery Contact information: [address] [place] [address] [phone]            Signed: Cecilie Kicks. 04/15/2018, 7:39 AM

## 2018-04-15 NOTE — Progress Notes (Signed)
Physical Therapy Treatment Patient Details Name: [name] MRN: [id number] DOB: June 04, 1960 Today's Date: 04/15/2018    History of Present Illness 57 yo female s/p excision of L trochanteric bursa, repair of vastus lateralis tear on 04/14/18. PMH includes OA, DM I, R hip ORIF, L heart cath and coronary angiography, lumbar laminectomy/decompression/microdiscectomy, ORIF R wrist fracture.     PT Comments    Pt with improved ambulation this session, min verbal cuing provided for sequencing with crutches. Pt adhered to 50% LLE WB status during session, min verbal reminders needed. Pt met all PT goals, including ambulation, stair navigation, bed mobility, transfers, and LE exercises. Pt appropriate to d/c home. Pt encouraged to have someone supervising her mobility upon return home, especially when transferring and when performing stair navigation. Pt with no further PT needs.    Follow Up Recommendations  Follow surgeon's recommendation for DC plan and follow-up therapies;Supervision for mobility/OOB     Equipment Recommendations  None recommended by PT(Pt had crutches delivered by orhto tech last night, sized for pt )    Recommendations for Other Services       Precautions / Restrictions Precautions Precautions: Fall Restrictions Weight Bearing Restrictions: Yes LLE Weight Bearing: Partial weight bearing LLE Partial Weight Bearing Percentage or Pounds: 50%    Mobility  Bed Mobility Overal bed mobility: Modified Independent             General bed mobility comments: Mod I for increased time to sit EOB and return to supine.   Transfers Overall transfer level: Needs assistance Equipment used: Rolling walker (2 wheeled) Transfers: Sit to/from Stand Sit to Stand: Supervision         General transfer comment: Supervision for safety. Verbal cuing for holding hand grip of crutch, pt attempting to place crutch in axillary region when moving from sit to stand. Pt with  self-steadying upon standing, PT passed pt second crutch.   Ambulation/Gait Ambulation/Gait assistance: Supervision Gait Distance (Feet): 300 Feet Assistive device: Rolling walker (2 wheeled) Gait Pattern/deviations: Step-to pattern;Decreased weight shift to left;Decreased stance time - left Gait velocity: mildly decreased   General Gait Details: Supervision for safety. Verbal cuing for placement of crutches, sequencing, and foot placement after foward progression of crutches. Pt encouraged to move slowly and carefully during ambulation, pt attempting step-through gait and stepping beyond crutches to increase gait speed    Stairs Stairs: Yes Stairs assistance: Min guard Stair Management: No rails;With crutches Number of Stairs: 4 General stair comments: Min guard for safety. Verbal cuing for sequencing ascending vs descending, placement of crutches. Pt with self-steadying at each step, no LOB or unsteadiness noted.    Wheelchair Mobility    Modified Rankin (Stroke Patients Only)       Balance Overall balance assessment: Mild deficits observed, not formally tested                                          Cognition Arousal/Alertness: Awake/alert Behavior During Therapy: WFL for tasks assessed/performed Overall Cognitive Status: Within Functional Limits for tasks assessed                                 General Comments: Pt somewhat impulsive with mobility, encouraged to move slowly and safely.       Exercises General Exercises - Lower Extremity Ankle Circles/Pumps:  AROM;Both;20 reps;Supine Quad Sets: AROM;Left;10 reps;Supine Heel Slides: AROM;Left;5 reps;Supine    General Comments        Pertinent Vitals/Pain Pain Assessment: 0-10 Pain Score: 2  Pain Location: L hip  Pain Descriptors / Indicators: Sore Pain Intervention(s): Limited activity within patient's tolerance;Monitored during session    Home Living                       Prior Function            PT Goals (current goals can now be found in the care plan section) Acute Rehab PT Goals Patient Stated Goal: go home  PT Goal Formulation: With patient Time For Goal Achievement: 04/21/18 Potential to Achieve Goals: Good Progress towards PT goals: Progressing toward goals    Frequency    Min 5X/week      PT Plan Current plan remains appropriate    Co-evaluation              AM-PAC PT "6 Clicks" Mobility   Outcome Measure  Help needed turning from your back to your side while in a flat bed without using bedrails?: None Help needed moving from lying on your back to sitting on the side of a flat bed without using bedrails?: None Help needed moving to and from a bed to a chair (including a wheelchair)?: A Little Help needed standing up from a chair using your arms (e.g., wheelchair or bedside chair)?: A Little Help needed to walk in hospital room?: A Little Help needed climbing 3-5 steps with a railing? : A Little 6 Click Score: 20    End of Session   Activity Tolerance: Patient tolerated treatment well Patient left: in bed;with call bell/phone within reach Nurse Communication: Mobility status PT Visit Diagnosis: Difficulty in walking, not elsewhere classified (R26.2);Other abnormalities of gait and mobility (R26.89)     Time: 1224-8250 PT Time Calculation (min) (ACUTE ONLY): 23 min  Charges:  $Gait Training: 8-22 mins $Therapeutic Activity: 8-22 mins                     [name], PT Acute Rehabilitation Services Pager [phone]  Office [phone]    [name] 04/15/2018, 10:18 AM

## 2018-04-15 NOTE — Progress Notes (Addendum)
Subjective: 1 Day Post-Op Procedure(s) (LRB): Excision trochanteric bursa left hip (Left) Patient reports pain as 3 on 0-10 scale.    Objective: Vital signs in last 24 hours: Temp:  [97.5 F (36.4 C)-98.3 F (36.8 C)] 98.2 F (36.8 C) (12/19 0214) Pulse Rate:  [62-77] 65 (12/19 0214) Resp:  [12-16] 16 (12/18 1912) BP: (83-110)/(44-68) 106/68 (12/19 0214) SpO2:  [93 %-100 %] 93 % (12/19 0214) Weight:  [65.3 kg] 65.3 kg (12/18 1524)  Intake/Output from previous day: 12/18 0701 - 12/19 0700 In: 2977.7 [P.O.:360; I.V.:2144.7; IV Piggyback:473] Out: 350 [Urine:300; Blood:50] Intake/Output this shift: No intake/output data recorded.  No results for input(s): HGB in the last 72 hours. No results for input(s): WBC, RBC, HCT, PLT in the last 72 hours. No results for input(s): NA, K, CL, CO2, BUN, CREATININE, GLUCOSE, CALCIUM in the last 72 hours. No results for input(s): LABPT, INR in the last 72 hours.  Neurologically intact Sensation intact distally Intact pulses distally Compartment soft    Assessment/Plan: 1 Day Post-Op Procedure(s) (LRB): Excision trochanteric bursa left hip (Left)  Advance diet Up with therapy D/C IV fluids Discharge home Moniter Glucose Discussed OR PWB   Johnn Hai 04/15/2018, 7:37 AM

## 2018-04-15 NOTE — Progress Notes (Signed)
RN reviewed discharge instructions with patient and family. All questions answered.   Paperwork and prescriptions given.   NT rolled patient down with all belongings to family car. 

## 2018-04-15 NOTE — Plan of Care (Signed)
Pt is stable. Fs at HS-326, pt received coverage. No acute distress. No signs of hypo/hyperglycemia at this time. Plan of care reviewed with pt.

## 2018-04-15 NOTE — Progress Notes (Signed)
Per message from Dr. Tonita Cong, patient will not need HHPT

## 2018-06-06 NOTE — Research (Signed)
CADFEM Informed Consent -Late Entry                 Subject Name:   [name]   Subject met inclusion and exclusion criteria.  The informed consent form, study requirements and expectations were reviewed with the subject and questions and concerns were addressed prior to the signing of the consent form.  The subject verbalized understanding of the trial requirements.  The subject agreed to participate in the CADFEM trial and signed the informed consent.  The informed consent was obtained prior to performance of any protocol-specific procedures for the subject.  A copy of the signed informed consent was given to the subject and a copy was placed in the subject's medical record. This patient was consented by Crisoforo Oxford on 09/08-2017 at 08:17   [place] Chalmers, Naval architect

## 2018-06-11 DIAGNOSIS — M2550 Pain in unspecified joint: Secondary | ICD-10-CM | POA: Insufficient documentation

## 2018-06-11 HISTORY — DX: Pain in unspecified joint: M25.50

## 2018-07-23 DIAGNOSIS — M25552 Pain in left hip: Secondary | ICD-10-CM

## 2018-07-23 HISTORY — DX: Pain in left hip: M25.552

## 2018-11-27 DIAGNOSIS — J9690 Respiratory failure, unspecified, unspecified whether with hypoxia or hypercapnia: Secondary | ICD-10-CM

## 2018-11-27 HISTORY — DX: Respiratory failure, unspecified, unspecified whether with hypoxia or hypercapnia: J96.90

## 2018-12-03 DIAGNOSIS — M546 Pain in thoracic spine: Secondary | ICD-10-CM

## 2018-12-03 HISTORY — DX: Pain in thoracic spine: M54.6

## 2018-12-05 DIAGNOSIS — J189 Pneumonia, unspecified organism: Secondary | ICD-10-CM | POA: Diagnosis not present

## 2018-12-05 DIAGNOSIS — R0902 Hypoxemia: Secondary | ICD-10-CM | POA: Diagnosis not present

## 2018-12-05 DIAGNOSIS — E109 Type 1 diabetes mellitus without complications: Secondary | ICD-10-CM

## 2018-12-05 DIAGNOSIS — E873 Alkalosis: Secondary | ICD-10-CM

## 2018-12-05 DIAGNOSIS — Z720 Tobacco use: Secondary | ICD-10-CM

## 2018-12-05 DIAGNOSIS — D72829 Elevated white blood cell count, unspecified: Secondary | ICD-10-CM

## 2018-12-06 DIAGNOSIS — E873 Alkalosis: Secondary | ICD-10-CM | POA: Diagnosis not present

## 2018-12-06 DIAGNOSIS — E109 Type 1 diabetes mellitus without complications: Secondary | ICD-10-CM | POA: Diagnosis not present

## 2018-12-06 DIAGNOSIS — J189 Pneumonia, unspecified organism: Secondary | ICD-10-CM | POA: Diagnosis not present

## 2018-12-06 DIAGNOSIS — R0902 Hypoxemia: Secondary | ICD-10-CM | POA: Diagnosis not present

## 2018-12-07 DIAGNOSIS — E109 Type 1 diabetes mellitus without complications: Secondary | ICD-10-CM | POA: Diagnosis not present

## 2018-12-07 DIAGNOSIS — R0902 Hypoxemia: Secondary | ICD-10-CM | POA: Diagnosis not present

## 2018-12-07 DIAGNOSIS — E873 Alkalosis: Secondary | ICD-10-CM | POA: Diagnosis not present

## 2018-12-07 DIAGNOSIS — J189 Pneumonia, unspecified organism: Secondary | ICD-10-CM | POA: Diagnosis not present

## 2018-12-07 DIAGNOSIS — I361 Nonrheumatic tricuspid (valve) insufficiency: Secondary | ICD-10-CM

## 2018-12-08 DIAGNOSIS — J189 Pneumonia, unspecified organism: Secondary | ICD-10-CM | POA: Diagnosis not present

## 2018-12-08 DIAGNOSIS — R0902 Hypoxemia: Secondary | ICD-10-CM | POA: Diagnosis not present

## 2018-12-08 DIAGNOSIS — I10 Essential (primary) hypertension: Secondary | ICD-10-CM

## 2018-12-08 DIAGNOSIS — E785 Hyperlipidemia, unspecified: Secondary | ICD-10-CM

## 2018-12-08 DIAGNOSIS — J9601 Acute respiratory failure with hypoxia: Secondary | ICD-10-CM

## 2018-12-09 DIAGNOSIS — J189 Pneumonia, unspecified organism: Secondary | ICD-10-CM | POA: Diagnosis not present

## 2018-12-09 DIAGNOSIS — R0902 Hypoxemia: Secondary | ICD-10-CM | POA: Diagnosis not present

## 2018-12-09 DIAGNOSIS — E785 Hyperlipidemia, unspecified: Secondary | ICD-10-CM | POA: Diagnosis not present

## 2018-12-09 DIAGNOSIS — J9601 Acute respiratory failure with hypoxia: Secondary | ICD-10-CM | POA: Diagnosis not present

## 2018-12-10 DIAGNOSIS — R0902 Hypoxemia: Secondary | ICD-10-CM | POA: Diagnosis not present

## 2018-12-10 DIAGNOSIS — E785 Hyperlipidemia, unspecified: Secondary | ICD-10-CM | POA: Diagnosis not present

## 2018-12-10 DIAGNOSIS — J189 Pneumonia, unspecified organism: Secondary | ICD-10-CM | POA: Diagnosis not present

## 2018-12-10 DIAGNOSIS — J9601 Acute respiratory failure with hypoxia: Secondary | ICD-10-CM | POA: Diagnosis not present

## 2019-03-04 DIAGNOSIS — M961 Postlaminectomy syndrome, not elsewhere classified: Secondary | ICD-10-CM | POA: Insufficient documentation

## 2019-03-04 HISTORY — DX: Postlaminectomy syndrome, not elsewhere classified: M96.1

## 2019-03-10 DIAGNOSIS — R7989 Other specified abnormal findings of blood chemistry: Secondary | ICD-10-CM

## 2019-03-10 HISTORY — DX: Other specified abnormal findings of blood chemistry: R79.89

## 2019-04-15 DIAGNOSIS — Z789 Other specified health status: Secondary | ICD-10-CM

## 2019-04-15 DIAGNOSIS — Z7289 Other problems related to lifestyle: Secondary | ICD-10-CM

## 2019-04-15 DIAGNOSIS — F1090 Alcohol use, unspecified, uncomplicated: Secondary | ICD-10-CM

## 2019-04-15 HISTORY — DX: Alcohol use, unspecified, uncomplicated: F10.90

## 2019-04-15 HISTORY — DX: Other specified health status: Z78.9

## 2019-04-15 HISTORY — DX: Other problems related to lifestyle: Z72.89

## 2019-04-24 DIAGNOSIS — I10 Essential (primary) hypertension: Secondary | ICD-10-CM

## 2019-04-24 DIAGNOSIS — E109 Type 1 diabetes mellitus without complications: Secondary | ICD-10-CM

## 2019-04-24 DIAGNOSIS — R509 Fever, unspecified: Secondary | ICD-10-CM | POA: Diagnosis not present

## 2019-04-24 DIAGNOSIS — U071 COVID-19: Secondary | ICD-10-CM | POA: Diagnosis not present

## 2019-04-24 DIAGNOSIS — R0902 Hypoxemia: Secondary | ICD-10-CM | POA: Diagnosis not present

## 2019-04-24 DIAGNOSIS — E785 Hyperlipidemia, unspecified: Secondary | ICD-10-CM

## 2019-04-24 DIAGNOSIS — I209 Angina pectoris, unspecified: Secondary | ICD-10-CM | POA: Diagnosis not present

## 2019-04-25 DIAGNOSIS — R509 Fever, unspecified: Secondary | ICD-10-CM | POA: Diagnosis not present

## 2019-04-25 DIAGNOSIS — I209 Angina pectoris, unspecified: Secondary | ICD-10-CM | POA: Diagnosis not present

## 2019-04-25 DIAGNOSIS — R0902 Hypoxemia: Secondary | ICD-10-CM | POA: Diagnosis not present

## 2019-04-25 DIAGNOSIS — U071 COVID-19: Secondary | ICD-10-CM | POA: Diagnosis not present

## 2019-08-24 DIAGNOSIS — M150 Primary generalized (osteo)arthritis: Secondary | ICD-10-CM

## 2019-08-24 DIAGNOSIS — M8949 Other hypertrophic osteoarthropathy, multiple sites: Secondary | ICD-10-CM

## 2019-08-24 DIAGNOSIS — M159 Polyosteoarthritis, unspecified: Secondary | ICD-10-CM

## 2019-08-24 HISTORY — DX: Primary generalized (osteo)arthritis: M15.0

## 2019-08-24 HISTORY — DX: Polyosteoarthritis, unspecified: M15.9

## 2019-08-24 HISTORY — DX: Other hypertrophic osteoarthropathy, multiple sites: M89.49

## 2019-09-05 ENCOUNTER — Other Ambulatory Visit: Payer: Self-pay | Admitting: Orthopedic Surgery

## 2019-09-05 DIAGNOSIS — M259 Joint disorder, unspecified: Secondary | ICD-10-CM

## 2019-09-13 ENCOUNTER — Telehealth: Payer: Self-pay | Admitting: Cardiology

## 2019-09-13 ENCOUNTER — Other Ambulatory Visit: Payer: Self-pay

## 2019-09-13 MED ORDER — NITROGLYCERIN 0.4 MG SL SUBL: 0.4000 mg | SUBLINGUAL_TABLET | SUBLINGUAL | 11 refills | Status: DC | PRN

## 2019-09-13 NOTE — Telephone Encounter (Signed)
Pt c/o of Chest Pain: STAT if CP now or developed within 24 hours  1. Are you having CP right now? No   2. Are you experiencing any other symptoms (ex. SOB, nausea, vomiting, sweating)? No   3. How long have you been experiencing CP? Occurred last week   4. Is your CP continuous or coming and going? Continuous   5. Have you taken Nitroglycerin? Yes   [name] is calling stating she experienced CP last week with no prior symptoms. She states the CP was continuous and that she had to take Nitroglycerin in order for the pain to subside. [name] also states she woke up this morning with terrible pain in her left arm around 5:30 AM, but no CP was present. [name] has an appointment scheduled for 09/15/19, but didn't know if she should be seen sooner due to this. Please advise.  ?

## 2019-09-13 NOTE — Telephone Encounter (Signed)
Called and spoke with pt  Who states that she is not having any pain currently. Pt states that her nitroglycerin are old, new rx sent to pharmacy. Pt advised to go to the ED for any other symptoms of chest pain, arm pain, n/v or shortness of breath. Pt will call the office for any other concerns.

## 2019-09-14 ENCOUNTER — Other Ambulatory Visit: Payer: Self-pay

## 2019-09-15 ENCOUNTER — Ambulatory Visit: Payer: BC Managed Care – PPO | Admitting: Cardiology

## 2019-09-15 ENCOUNTER — Encounter: Payer: Self-pay | Admitting: Cardiology

## 2019-09-15 ENCOUNTER — Other Ambulatory Visit: Payer: Self-pay

## 2019-09-15 VITALS — BP 120/66 | HR 76 | Ht 62.0 in | Wt 150.0 lb

## 2019-09-15 DIAGNOSIS — F17210 Nicotine dependence, cigarettes, uncomplicated: Secondary | ICD-10-CM | POA: Diagnosis not present

## 2019-09-15 DIAGNOSIS — E088 Diabetes mellitus due to underlying condition with unspecified complications: Secondary | ICD-10-CM

## 2019-09-15 DIAGNOSIS — E782 Mixed hyperlipidemia: Secondary | ICD-10-CM | POA: Diagnosis not present

## 2019-09-15 DIAGNOSIS — R7989 Other specified abnormal findings of blood chemistry: Secondary | ICD-10-CM

## 2019-09-15 DIAGNOSIS — I208 Other forms of angina pectoris: Secondary | ICD-10-CM | POA: Diagnosis not present

## 2019-09-15 DIAGNOSIS — I25118 Atherosclerotic heart disease of native coronary artery with other forms of angina pectoris: Secondary | ICD-10-CM | POA: Diagnosis not present

## 2019-09-15 HISTORY — DX: Diabetes mellitus due to underlying condition with unspecified complications: E08.8

## 2019-09-15 MED ORDER — METOPROLOL TARTRATE 100 MG PO TABS: 100.0000 mg | ORAL_TABLET | Freq: Once | ORAL | 0 refills | Status: DC

## 2019-09-15 NOTE — Progress Notes (Signed)
Cardiology Office Note:    Date:  09/15/2019   ID:  [name], DOB 02/29/1961, MRN [id number]  PCP:  [name], MD  Cardiologist:  [name], MD   Referring MD: [name], MD    ASSESSMENT:    1. Coronary artery disease of native artery of native heart with stable angina pectoris (HCC)   2. Elevated LFTs   3. Smoking greater than 30 pack years   4. Mixed hyperlipidemia   5. Diabetes mellitus due to underlying condition with unspecified complications ([name])    PLAN:    In order of problems listed above:  1. Coronary artery disease and angina pectoris:In view of the patient's symptoms, I discussed with the patient options for evaluation. Invasive and noninvasive options were given to the patient. I discussed stress testing and coronary angiography and left heart catheterization at length. Benefits, pros and cons of each approach were discussed at length. Patient had multiple questions which were answered to the patient's satisfaction. Patient opted for invasive evaluation and we will set up for coronary angiography and left heart catheterization. Further recommendations will be made based on the findings with coronary angiography. In the interim if the patient has any significant symptoms in hospital to the nearest emergency room. 2. Cigarette smoker: I spent 5 minutes with the patient discussing solely about smoking. Smoking cessation was counseled. I suggested to the patient also different medications and pharmacological interventions. Patient is keen to try stopping on its own at this time. He will get back to me if he needs any further assistance in this matter. 3. Essential hypertension: Blood pressure stable 4. Mixed dyslipidemia: She is initiated on statins by primary care physician.  Her LFTs were elevated in the past but she tells me that these were checked this week and they were found to be within normal limits.  This is managed by her primary care physician.   Diet was emphasized 5. Diabetes mellitus: Diet was emphasized.  Further recommendations will be made based on the findings of the coronary angiography.  Patient had multiple questions which were answered to her satisfaction.   Medication Adjustments/Labs and Tests Ordered: Current medicines are reviewed at length with the patient today.  Concerns regarding medicines are outlined above.  No orders of the defined types were placed in this encounter.  No orders of the defined types were placed in this encounter.    Chief Complaint  Patient presents with  . Follow-up     History of Present Illness:    [name] is a 59 y.o. female.  Patient has past medical history of nonobstructive coronary artery disease, essential hypertension dyslipidemia diabetes mellitus and unfortunately continues to smoke.  She mentions to me that she had an episode of chest tightness which radiated to the neck.  She has nitroglycerin with relief.  She is very concerned about the symptoms and therefore is here for evaluation.  She leads a sedentary lifestyle overall and unfortunately continues to smoke.  She is a diabetic.  At the time of my evaluation, the patient is alert awake oriented and in no distress.  Past Medical History:  Diagnosis Date  . Abnormal thyroid function test 12/06/2015  . Acute bilateral thoracic back pain 12/03/2018  . Alcohol use 04/15/2019   Formatting of this note might be different from the original. Recommend abstinence from alcohol  . Anxiety disorder 06/05/2015   Last Assessment & Plan:  Formatting of this note might be different  from the original. Had started her on some xanax about week ago and she feels it has helped her and she is taking about half tablet for this and will continue with it as she has been compliant with it  . Arteriosclerotic vascular disease 11/06/2017  . Arthritis    back   . Atherosclerosis 06/05/2015   Formatting of this note might be different from the  original. Seen on CTs of chest.  . Attention deficit 12/04/2017  . Bursitis of both hips 04/14/2018  . CAD (coronary artery disease) 12/24/2017  . Chest pain 11/06/2017  . Chronic bronchitis ([place]) 12/04/2015   Formatting of this note might be different from the original. PFT ratio 88% FEV1 103% FVC 94% DLCO 76%  Last Assessment & Plan:  Formatting of this note might be different from the original. We discussed this as she has been coughing again for about 2 mnths and she sounds congested on exam with previous ease for pneumonia will place her on levaquin and have reviewed with her the importance of her   . Cigarette smoker 02/26/2016  . Coronary arteriosclerosis 12/24/2017  . Coronary artery disease of native artery of native heart with stable angina pectoris ([place]) 01/25/2016   Formatting of this note might be different from the original. 2017:  50% LAD lesion.  Calcium noted other places.  Heart cath December 31, 2017 with a 30% ostial LAD lesion otherwise normal coronary arteries seen with LVEF 55 to 65%  . Degeneration of lumbar intervertebral disc 11/06/2017  . Diabetes mellitus type 1, uncomplicated ([place]) 0000000   Added automatically from request for surgery [id number]  Formatting of this note might be different from the original. Added automatically from request for surgery [phone]  . Diabetes mellitus without complication (HCC)    Type 1- injections only- recent change to [place] with improved A1C reading.  Marland Kitchen Dysphagia 11/04/2017  . Dyspnea on exertion 12/04/2017  . Elevated LFTs 03/10/2019   Formatting of this note might be different from the original. History.  Cannot change to this.  . Erythrocytosis 12/04/2017  . Fatigue 11/06/2017  . Folic acid deficiency 123456  . Foot pain 01/28/2018  . Hemangioma 11/06/2017  . History of fracture 11/06/2017  . History of metabolic disorder [id number]  . HNP (herniated nucleus pulposus), lumbar 05/10/2014  . Insulin long-term use ([place]) 11/06/2017  .  Intermittent claudication ([place]) 12/04/2017  . Lumbar post-laminectomy syndrome 03/04/2019  . Lumbar radiculopathy 05/26/2017  . Lung mass 12/04/2017  . Malaise and fatigue 11/06/2017  . Menopausal disorder 11/06/2017  . Mixed hyperlipidemia 04/09/2016   Formatting of this note might be different from the original. Added automatically from request for surgery [id number]  . Moderate chronic obstructive pulmonary disease ([place]) 11/06/2017  . Multiple actinic keratoses 11/06/2017  . Myositis 12/04/2017  . Overweight with body mass index (BMI) 25.0-29.9 07/11/2015   Last Assessment & Plan:  Relevant Hx: Course: Daily Update: Today's Plan:difficult for her with the insulin as well as her abilify and the weight is increaseing for her rather than decreasing, she has cut her alcohol intake back, she has worked on her diet more with decreasing her carb intake as well and still difficulty discussed with the saxenda and will try to get this approved for her and see   . Pain of left hip joint 07/23/2018  . Polyarthralgia 06/11/2018  . Primary insomnia 06/05/2015  . Primary osteoarthritis involving multiple joints 08/24/2019  . Purpura ([place]) 11/06/2017  . Sciatica 11/03/2017  .  Scoliosis deformity of spine 11/03/2017  . Secondary diabetes mellitus ([place]) 12/24/2017  . Senile purpura ([place]) 12/04/2017  . Smoking greater than 30 pack years 02/26/2016  . Tobacco abuse 02/11/2016  . Tobacco user 02/11/2016  . Trichotillomania    hx.     Past Surgical History:  Procedure Laterality Date  . APPENDECTOMY    . EXCISION/RELEASE BURSA HIP Left 04/14/2018   Procedure: Excision trochanteric bursa left hip;  Surgeon: Susa Day, MD;  Location: [facility];  Service: Orthopedics;  Laterality: Left;  60 mins  . HIP OPEN REDUCTION Right    ORIF  . LEFT HEART CATH AND CORONARY ANGIOGRAPHY N/A 12/31/2017   Procedure: LEFT HEART CATH AND CORONARY ANGIOGRAPHY;  Surgeon: Martinique, Peter M, MD;  Location: [facility];  Service: Cardiovascular;   Laterality: N/A;  . LUMBAR LAMINECTOMY/DECOMPRESSION MICRODISCECTOMY Left 05/10/2014   Procedure: MICRODISCECTOMY LUMBAR DECOMPRESSION L5-S1 LEFT ;  Surgeon: Johnn Hai, MD;  Location: [facility];  Service: Orthopedics;  Laterality: Left;  . ORIF WRIST FRACTURE Right    retained hardware    Current Medications: Current Meds  Medication Sig  . ALPRAZolam (XANAX) 0.5 MG tablet Take 0.5-1 mg by mouth every 8 (eight) hours as needed.  Marland Kitchen atorvastatin (LIPITOR) 40 MG tablet Take 40 mg by mouth at bedtime.  . cyanocobalamin (,VITAMIN B-12,) 1000 MCG/ML injection Inject 1,000 mcg into the muscle every 14 (fourteen) days.  Marland Kitchen docusate sodium (COLACE) 100 MG capsule Take 1 capsule (100 mg total) by mouth 2 (two) times daily.  Marland Kitchen FLUoxetine (PROZAC) 40 MG capsule Take 40 mg by mouth 2 (two) times daily.  . folic acid (FOLVITE) 1 MG tablet Take 1 mg by mouth at bedtime.   . gabapentin (NEURONTIN) 100 MG capsule Take 100 mg by mouth daily.  . insulin aspart (NOVOLOG FLEXPEN) 100 UNIT/ML FlexPen Novolog Flexpen U-100 Insulin aspart 100 unit/mL (3 mL) subcutaneous  ADM 10 UNI [place] WITH EACH MEAL UTD PER SLIDING SCALE. MAX D DOSE OF 25 UNI  . insulin degludec (TRESIBA FLEXTOUCH) 200 UNIT/ML FlexTouch Pen Inject 50 Units into the skin daily.  Marland Kitchen linaclotide (LINZESS) 290 MCG CAPS capsule Take 290 mcg by mouth at bedtime.   . nitroGLYCERIN (NITROSTAT) 0.4 MG SL tablet Place 1 tablet (0.4 mg total) under the tongue every 5 (five) minutes as needed.     Allergies:   Celebrex [celecoxib] and Mobic [meloxicam]   Social History   Socioeconomic History  . Marital status: Single    Spouse name: Not on file  . Number of children: Not on file  . Years of education: Not on file  . Highest education level: Not on file  Occupational History  . Not on file  Tobacco Use  . Smoking status: Current Every Day Smoker    Packs/day: 0.50    Years: 30.00    Pack years: 15.00    Types: Cigarettes  . Smokeless tobacco:  Never Used  Substance and Sexual Activity  . Alcohol use: Yes    Comment: weekends - social  . Drug use: No  . Sexual activity: Not on file  Other Topics Concern  . Not on file  Social History Narrative  . Not on file   Social Determinants of Health   Financial Resource Strain:   . Difficulty of Paying Living Expenses:   Food Insecurity:   . Worried About Charity fundraiser in the Last Year:   . [place] in the Last Year:  Transportation Needs:   . Film/video editor (Medical):   Marland Kitchen Lack of Transportation (Non-Medical):   Physical Activity:   . Days of Exercise per Week:   . Minutes of Exercise per Session:   Stress:   . Feeling of Stress :   Social Connections:   . Frequency of Communication with Friends and Family:   . Frequency of Social Gatherings with Friends and Family:   . Attends Religious Services:   . Active Member of Clubs or Organizations:   . Attends Archivist Meetings:   Marland Kitchen Marital Status:      Family History: The patient's family history includes Cancer in her mother; Diabetes in her father and mother; Heart disease in her father; Hypertension in her father.  ROS:   Please see the history of present illness.    All other systems reviewed and are negative.  EKGs/Labs/Other Studies Reviewed:    The following studies were reviewed today: EKG reveals sinus rhythm and nonspecific ST-T changes.   Recent Labs: No results found for requested labs within last 8760 hours.  Recent Lipid Panel No results found for: CHOL, TRIG, HDL, CHOLHDL, VLDL, LDLCALC, LDLDIRECT  Physical Exam:    VS:  BP 120/66   Pulse 76   Ht 5\' 2"  (1.575 m)   Wt 150 lb (68 kg)   LMP 05/04/2008   SpO2 95%   BMI 27.44 kg/m     Wt Readings from Last 3 Encounters:  09/15/19 150 lb (68 kg)  04/14/18 144 lb (65.3 kg)  04/09/18 145 lb (65.8 kg)     GEN: Patient is in no acute distress HEENT: Normal NECK: No JVD; No carotid bruits LYMPHATICS: No  lymphadenopathy CARDIAC: Hear sounds regular, 2/6 systolic murmur at the apex. RESPIRATORY:  Clear to auscultation without rales, wheezing or rhonchi  ABDOMEN: Soft, non-tender, non-distended MUSCULOSKELETAL:  No edema; No deformity  SKIN: Warm and dry NEUROLOGIC:  Alert and oriented x 3 PSYCHIATRIC:  Normal affect   Signed, [name], MD  09/15/2019 10:21 AM    [place]

## 2019-09-15 NOTE — Patient Instructions (Signed)
Medication Instructions:  No medication changes *If you need a refill on your cardiac medications before your next appointment, please call your pharmacy*   Lab Work: Your physician recommends that you return for lab work in: 1 week prior to your CT. You can come Monday through Friday 8:30 am to 12:00 pm and 1:15 to 4:30. You do not need to make an appointment as the order has already been placed. We will do a BMET to check your kidney function prior to your CT.   If you have labs (blood work) drawn today and your tests are completely normal, you will receive your results only by: Marland Kitchen MyChart Message (if you have MyChart) OR . A paper copy in the mail If you have any lab test that is abnormal or we need to change your treatment, we will call you to review the results.   Testing/Procedures: Your cardiac CT will be scheduled at:   [facility] [address] [place], [place] [address] [phone]  [facility], please arrive at the [facility] main entrance of [facility] 30 minutes prior to test start time. Proceed to the [facility] Radiology Department (first floor) to check-in and test prep.  If scheduled at [facility], please arrive 15 mins early for check-in and test prep.  Please follow these instructions carefully (unless otherwise directed):   On the Night Before the Test: . Be sure to Drink plenty of water. . Do not consume any caffeinated/decaffeinated beverages or chocolate 12 hours prior to your test. . Do not take any antihistamines 12 hours prior to your test. . If you take Metformin do not take 24 hours prior to test. .  On the Day of the Test: . Drink plenty of water. Do not drink any water within one hour of the test. . Do not eat any food 4 hours prior to the test. . You may take your regular medications prior to the test.  . Take metoprolol (Lopressor) two hours prior to test. A one time dose of this  medication has been sent to [facility]. You will take this 2 hours prior to your test as long as your heart rate is greater than 55. Marland Kitchen FEMALES- please wear underwire-free bra if available  After the Test: . Drink plenty of water. . After receiving IV contrast, you may experience a mild flushed feeling. This is normal. . On occasion, you may experience a mild rash up to 24 hours after the test. This is not dangerous. If this occurs, you can take Benadryl 25 mg and increase your fluid intake. . If you experience trouble breathing, this can be serious. If it is severe call 911 IMMEDIATELY. If it is mild, please call our office. . If you take any of these medications: Glipizide/Metformin, Avandament, Glucavance, please do not take 48 hours after completing test unless otherwise instructed.   Once we have confirmed authorization from your insurance company, we will call you to set up a date and time for your test.   For non-scheduling related questions, please contact the cardiac imaging nurse navigator should you have any questions/concerns: Marchia Bond, Cardiac Imaging Nurse Navigator Burley Saver, Interim Cardiac Imaging Nurse [place] [facility] Direct Office Dial: [phone]   For scheduling needs, including cancellations and rescheduling, please call [phone].     Follow-Up: At [facility], you and your health needs are our priority.  As part of our continuing mission to provide  you with exceptional heart care, we have created designated Provider Care Teams.  These Care Teams include your primary Cardiologist (physician) and Advanced Practice Providers (APPs -  Physician Assistants and Nurse Practitioners) who all work together to provide you with the care you need, when you need it.  We recommend signing up for the patient portal called "MyChart".  Sign up information is provided on this After Visit Summary.  MyChart is used to connect with patients for  Virtual Visits (Telemedicine).  Patients are able to view lab/test results, encounter notes, upcoming appointments, etc.  Non-urgent messages can be sent to your provider as well.   To learn more about what you can do with MyChart, go to [email or web address].    Your next appointment:   1 month(s)  The format for your next appointment:   In Person  Provider:   Jyl Heinz, MD   Other Instructions  Cardiac CT Angiogram A cardiac CT angiogram is a procedure to look at the heart and the area around the heart. It may be done to help find the cause of chest pains or other symptoms of heart disease. During this procedure, a substance called contrast dye is injected into the blood vessels in the area to be checked. A large X-ray machine, called a CT scanner, then takes detailed pictures of the heart and the surrounding area. The procedure is also sometimes called a coronary CT angiogram, coronary artery scanning, or CTA. A cardiac CT angiogram allows the health care provider to see how well blood is flowing to and from the heart. The health care provider will be able to see if there are any problems, such as:  Blockage or narrowing of the coronary arteries in the heart.  Fluid around the heart.  Signs of weakness or disease in the muscles, valves, and tissues of the heart. Tell a health care provider about:  Any allergies you have. This is especially important if you have had a previous allergic reaction to contrast dye.  All medicines you are taking, including vitamins, herbs, eye drops, creams, and over-the-counter medicines.  Any blood disorders you have.  Any surgeries you have had.  Any medical conditions you have.  Whether you are pregnant or may be pregnant.  Any anxiety disorders, chronic pain, or other conditions you have that may increase your stress or prevent you from lying still. What are the risks? Generally, this is a safe procedure. However, problems may occur,  including:  Bleeding.  Infection.  Allergic reactions to medicines or dyes.  Damage to other structures or organs.  Kidney damage from the contrast dye that is used.  Increased risk of cancer from radiation exposure. This risk is low. Talk with your health care provider about: ? The risks and benefits of testing. ? How you can receive the lowest dose of radiation. What happens before the procedure?  Wear comfortable clothing and remove any jewelry, glasses, dentures, and hearing aids.  Follow instructions from your health care provider about eating and drinking. This may include: ? For 12 hours before the procedure -- avoid caffeine. This includes tea, coffee, soda, energy drinks, and diet pills. Drink plenty of water or other fluids that do not have caffeine in them. Being well hydrated can prevent complications. ? For 4-6 hours before the procedure -- stop eating and drinking. The contrast dye can cause nausea, but this is less likely if your stomach is empty.  Ask your health care provider about changing or stopping your regular  medicines. This is especially important if you are taking diabetes medicines, blood thinners, or medicines to treat problems with erections (erectile dysfunction). What happens during the procedure?   Hair on your chest may need to be removed so that small sticky patches called electrodes can be placed on your chest. These will transmit information that helps to monitor your heart during the procedure.  An IV will be inserted into one of your veins.  You might be given a medicine to control your heart rate during the procedure. This will help to ensure that good images are obtained.  You will be asked to lie on an exam table. This table will slide in and out of the CT machine during the procedure.  Contrast dye will be injected into the IV. You might feel warm, or you may get a metallic taste in your mouth.  You will be given a medicine called  nitroglycerin. This will relax or dilate the arteries in your heart.  The table that you are lying on will move into the CT machine tunnel for the scan.  The person running the machine will give you instructions while the scans are being done. You may be asked to: ? Keep your arms above your head. ? Hold your breath. ? Stay very still, even if the table is moving.  When the scanning is complete, you will be moved out of the machine.  The IV will be removed. The procedure may vary among health care providers and hospitals. What can I expect after the procedure? After your procedure, it is common to have:  A metallic taste in your mouth from the contrast dye.  A feeling of warmth.  A headache from the nitroglycerin. Follow these instructions at home:  Take over-the-counter and prescription medicines only as told by your health care provider.  If you are told, drink enough fluid to keep your urine pale yellow. This will help to flush the contrast dye out of your body.  Most people can return to their normal activities right after the procedure. Ask your health care provider what activities are safe for you.  It is up to you to get the results of your procedure. Ask your health care provider, or the department that is doing the procedure, when your results will be ready.  Keep all follow-up visits as told by your health care provider. This is important. Contact a health care provider if:  You have any symptoms of allergy to the contrast dye. These include: ? Shortness of breath. ? Rash or hives. ? A racing heartbeat. Summary  A cardiac CT angiogram is a procedure to look at the heart and the area around the heart. It may be done to help find the cause of chest pains or other symptoms of heart disease.  During this procedure, a large X-ray machine, called a CT scanner, takes detailed pictures of the heart and the surrounding area after a contrast dye has been injected into blood  vessels in the area.  Ask your health care provider about changing or stopping your regular medicines before the procedure. This is especially important if you are taking diabetes medicines, blood thinners, or medicines to treat erectile dysfunction.  If you are told, drink enough fluid to keep your urine pale yellow. This will help to flush the contrast dye out of your body. This information is not intended to replace advice given to you by your health care provider. Make sure you discuss any questions you have  with your health care provider. Document Revised: 12/08/2018 Document Reviewed: 12/08/2018 Elsevier Patient Education  [place].

## 2019-09-21 ENCOUNTER — Ambulatory Visit
Admission: RE | Admit: 2019-09-21 | Discharge: 2019-09-21 | Disposition: A | Discharge status: Home / Self Care | Payer: BC Managed Care – PPO | Source: Ambulatory Visit | Attending: Orthopedic Surgery | Admitting: Orthopedic Surgery

## 2019-09-21 ENCOUNTER — Other Ambulatory Visit: Payer: Self-pay

## 2019-09-21 DIAGNOSIS — M259 Joint disorder, unspecified: Secondary | ICD-10-CM

## 2019-09-29 ENCOUNTER — Telehealth: Payer: Self-pay | Admitting: Cardiology

## 2019-09-29 NOTE — Telephone Encounter (Signed)
Patient called and said she lost the paperwork that was given with her instructions to follow prior to her CT scan. She would like the office to mail her another copy of the paperwork and AVS that has those instructions. The patient has verified her mailing address

## 2019-09-29 NOTE — Telephone Encounter (Signed)
Instructions mailed to patient at this time.

## 2019-10-03 ENCOUNTER — Telehealth (HOSPITAL_COMMUNITY): Payer: Self-pay | Admitting: *Deleted

## 2019-10-03 NOTE — Telephone Encounter (Signed)

## 2019-10-04 ENCOUNTER — Ambulatory Visit (HOSPITAL_COMMUNITY)
Admission: RE | Admit: 2019-10-04 | Discharge: 2019-10-04 | Disposition: A | Discharge status: Home / Self Care | Payer: BC Managed Care – PPO | Source: Ambulatory Visit | Attending: Cardiology | Admitting: Cardiology

## 2019-10-04 ENCOUNTER — Telehealth: Payer: Self-pay | Admitting: Cardiology

## 2019-10-04 ENCOUNTER — Encounter (HOSPITAL_COMMUNITY): Payer: Self-pay

## 2019-10-04 ENCOUNTER — Other Ambulatory Visit: Payer: Self-pay

## 2019-10-04 DIAGNOSIS — I208 Other forms of angina pectoris: Secondary | ICD-10-CM | POA: Insufficient documentation

## 2019-10-04 LAB — BASIC METABOLIC PANEL
BUN/Creatinine Ratio: 19 (ref 12–28)
BUN: 11 mg/dL (ref 8–27)
CO2: 23 mmol/L (ref 20–29)
Calcium: 9.9 mg/dL (ref 8.7–10.3)
Chloride: 102 mmol/L (ref 96–106)
Creatinine, Ser: 0.57 mg/dL (ref 0.57–1.00)
GFR calc Af Amer: 117 mL/min/{1.73_m2} (ref >59)
GFR calc non Af Amer: 101 mL/min/{1.73_m2} (ref >59)
Glucose: 177 mg/dL — ABNORMAL HIGH (ref 65–99)
Potassium: 4.8 mmol/L (ref 3.5–5.2)
Sodium: 141 mmol/L (ref 134–144)

## 2019-10-04 MED ORDER — NITROGLYCERIN 0.4 MG SL SUBL
0.4000 mg | SUBLINGUAL_TABLET | Freq: Once | SUBLINGUAL | Status: AC
  Administered 2019-10-04: 0.4 mg via SUBLINGUAL

## 2019-10-04 MED ORDER — NITROGLYCERIN 0.4 MG SL SUBL
SUBLINGUAL_TABLET | SUBLINGUAL | Status: AC
  Filled 2019-10-04: qty 1

## 2019-10-04 MED ORDER — IOHEXOL 350 MG/ML SOLN
80.0000 mL | Freq: Once | INTRAVENOUS | Status: AC | PRN
  Administered 2019-10-04: 80 mL via INTRAVENOUS

## 2019-10-04 MED ORDER — NITROGLYCERIN 0.4 MG SL SUBL: 0.8000 mg | SUBLINGUAL_TABLET | Freq: Once | SUBLINGUAL | Status: DC

## 2019-10-04 MED ORDER — SODIUM CHLORIDE 0.9 % IV SOLN: Freq: Once | INTRAVENOUS | Status: DC

## 2019-10-04 NOTE — Telephone Encounter (Signed)
New message   Patient would like to receive a call to go over her CT Results. Please call.

## 2019-10-04 NOTE — Telephone Encounter (Signed)
Pt is aware that the completed report has not been sent from Dr. Geraldo Pitter and as soon as he sends me the report that I will contact her.

## 2019-10-06 NOTE — Addendum Note (Signed)
Addended by: Truddie Hidden on: 10/06/2019 10:23 AM   Modules accepted: Orders

## 2019-10-10 ENCOUNTER — Other Ambulatory Visit: Payer: Self-pay

## 2019-10-11 LAB — LIPID PANEL
Chol/HDL Ratio: 3.8 ratio (ref 0.0–4.4)
Cholesterol, Total: 186 mg/dL (ref 100–199)
HDL: 49 mg/dL (ref >39)
LDL Chol Calc (NIH): 113 mg/dL — ABNORMAL HIGH (ref 0–99)
Triglycerides: 136 mg/dL (ref 0–149)
VLDL Cholesterol Cal: 24 mg/dL (ref 5–40)

## 2019-10-11 LAB — HEPATIC FUNCTION PANEL
ALT: 67 IU/L — ABNORMAL HIGH (ref 0–32)
AST: 53 IU/L — ABNORMAL HIGH (ref 0–40)
Albumin: 4 g/dL (ref 3.8–4.9)
Alkaline Phosphatase: 125 IU/L — ABNORMAL HIGH (ref 48–121)
Bilirubin Total: 0.7 mg/dL (ref 0.0–1.2)
Bilirubin, Direct: 0.16 mg/dL (ref 0.00–0.40)
Total Protein: 5.9 g/dL — ABNORMAL LOW (ref 6.0–8.5)

## 2019-10-13 ENCOUNTER — Other Ambulatory Visit: Payer: Self-pay

## 2019-10-13 ENCOUNTER — Telehealth: Payer: Self-pay | Admitting: Cardiology

## 2019-10-13 ENCOUNTER — Ambulatory Visit (INDEPENDENT_AMBULATORY_CARE_PROVIDER_SITE_OTHER): Payer: BC Managed Care – PPO | Admitting: Cardiology

## 2019-10-13 ENCOUNTER — Encounter: Payer: Self-pay | Admitting: Cardiology

## 2019-10-13 VITALS — BP 111/72 | HR 74 | Ht 62.0 in | Wt 148.2 lb

## 2019-10-13 DIAGNOSIS — E782 Mixed hyperlipidemia: Secondary | ICD-10-CM | POA: Diagnosis not present

## 2019-10-13 DIAGNOSIS — Z720 Tobacco use: Secondary | ICD-10-CM | POA: Diagnosis not present

## 2019-10-13 DIAGNOSIS — I2510 Atherosclerotic heart disease of native coronary artery without angina pectoris: Secondary | ICD-10-CM | POA: Diagnosis not present

## 2019-10-13 NOTE — Telephone Encounter (Signed)
    Pt requesting if she can bring her Mom to her appt today,. Explained visitor policy, but pt said since this is her f/u she is nervious and would be more comfortable if her Mother is there.  Please advise

## 2019-10-13 NOTE — Patient Instructions (Signed)

## 2019-10-13 NOTE — Progress Notes (Signed)
Cardiology Office Note:    Date:  10/13/2019   ID:  [name], DOB 10/08/1960, MRN [id number]  PCP:  [name], MD  Cardiologist:  [name], MD   Referring MD: [name], MD    ASSESSMENT:    1. Coronary artery disease involving native coronary artery of native heart without angina pectoris   2. Mixed hyperlipidemia   3. Tobacco abuse    PLAN:    In order of problems listed above:  1. Coronary artery disease: I discussed my findings with the patient at length.  CT coronary angiography report was detailed to her and she vocalized understanding.  She is on aspirin.  She is statin therapy but she cannot tolerate it.  Her LFTs go up therefore she will be referred to the lipid clinic for further evaluation for this and lipids management. 2. Preoperative cardiovascular evaluation: In view of the above findings he is not at high risk for coronary events during the aforementioned surgery.  Meticulous hemodynamic monitoring will further reduce the risk of coronary events.  Her antiplatelet agents can be held for a period  Of time necessary for her surgeon. 3. Cigarette smoker: I spent 5 minutes with the patient discussing solely about smoking. Smoking cessation was counseled. I suggested to the patient also different medications and pharmacological interventions. Patient is keen to try stopping on its own at this time. He will get back to me if he needs any further assistance in this matter. 4. Patient will be seen in follow-up appointment in 6 months or earlier if the patient has any concerns    Medication Adjustments/Labs and Tests Ordered: Current medicines are reviewed at length with the patient today.  Concerns regarding medicines are outlined above.  Orders Placed This Encounter  Procedures  . AMB Referral to [facility]   No orders of the defined types were placed in this encounter.    No chief complaint on file.    History of  Present Illness:    [name] is a 59 y.o. female.  Patient has history of coronary artery disease.  She has proximal LAD which is nonobstructive by CT coronary angiography.  She is walking some on a regular basis.  She has orthopedic issues involving her back and she is trying to get surgery done for it.  No chest pain orthopnea or PND.  At the time of my evaluation, the patient is alert awake oriented and in no distress.  She tells me that she has not smoked for a week now.  At the time of my evaluation, the patient is alert awake oriented and in no distress.  Past Medical History:  Diagnosis Date  . Abnormal thyroid function test 12/06/2015  . Acute bilateral thoracic back pain 12/03/2018  . Alcohol use 04/15/2019   Formatting of this note might be different from the original. Recommend abstinence from alcohol  . Anxiety disorder 06/05/2015   Last Assessment & Plan:  Formatting of this note might be different from the original. Had started her on some xanax about week ago and she feels it has helped her and she is taking about half tablet for this and will continue with it as she has been compliant with it  . Arteriosclerotic vascular disease 11/06/2017  . Arthritis    back   . Atherosclerosis 06/05/2015   Formatting of this note might be different from the original. Seen on CTs of chest.  . Attention deficit  12/04/2017  . Bursitis of both hips 04/14/2018  . CAD (coronary artery disease) 12/24/2017  . Chest pain 11/06/2017  . Chronic bronchitis ([place]) 12/04/2015   Formatting of this note might be different from the original. PFT ratio 88% FEV1 103% FVC 94% DLCO 76%  Last Assessment & Plan:  Formatting of this note might be different from the original. We discussed this as she has been coughing again for about 2 mnths and she sounds congested on exam with previous ease for pneumonia will place her on levaquin and have reviewed with her the importance of her   . Cigarette smoker 02/26/2016  . Coronary  arteriosclerosis 12/24/2017  . Coronary artery disease of native artery of native heart with stable angina pectoris ([place]) 01/25/2016   Formatting of this note might be different from the original. 2017:  50% LAD lesion.  Calcium noted other places.  Heart cath December 31, 2017 with a 30% ostial LAD lesion otherwise normal coronary arteries seen with LVEF 55 to 65%  . Degeneration of lumbar intervertebral disc 11/06/2017  . Diabetes mellitus type 1, uncomplicated ([place]) 32/54/9826   Added automatically from request for surgery [phone]  Formatting of this note might be different from the original. Added automatically from request for surgery [phone]  . Diabetes mellitus without complication (HCC)    Type 1- injections only- recent change to [place] with improved A1C reading.  Marland Kitchen Dysphagia 11/04/2017  . Dyspnea on exertion 12/04/2017  . Elevated LFTs 03/10/2019   Formatting of this note might be different from the original. History.  Cannot change to this.  . Erythrocytosis 12/04/2017  . Fatigue 11/06/2017  . Folic acid deficiency 0/88/1103  . Foot pain 01/28/2018  . Hemangioma 11/06/2017  . History of fracture 11/06/2017  . History of metabolic disorder 05/02/9456  . HNP (herniated nucleus pulposus), lumbar 05/10/2014  . Insulin long-term use ([place]) 11/06/2017  . Intermittent claudication ([place]) 12/04/2017  . Lumbar post-laminectomy syndrome 03/04/2019  . Lumbar radiculopathy 05/26/2017  . Lung mass 12/04/2017  . Malaise and fatigue 11/06/2017  . Menopausal disorder 11/06/2017  . Mixed hyperlipidemia 04/09/2016   Formatting of this note might be different from the original. Added automatically from request for surgery [phone]  . Moderate chronic obstructive pulmonary disease ([place]) 11/06/2017  . Multiple actinic keratoses 11/06/2017  . Myositis 12/04/2017  . Overweight with body mass index (BMI) 25.0-29.9 07/11/2015   Last Assessment & Plan:  Relevant Hx: Course: Daily Update: Today's Plan:difficult for her with the  insulin as well as her abilify and the weight is increaseing for her rather than decreasing, she has cut her alcohol intake back, she has worked on her diet more with decreasing her carb intake as well and still difficulty discussed with the saxenda and will try to get this approved for her and see   . Pain of left hip joint 07/23/2018  . Polyarthralgia 06/11/2018  . Primary insomnia 06/05/2015  . Primary osteoarthritis involving multiple joints 08/24/2019  . Purpura ([place]) 11/06/2017  . Sciatica 11/03/2017  . Scoliosis deformity of spine 11/03/2017  . Secondary diabetes mellitus ([place]) 12/24/2017  . Senile purpura ([place]) 12/04/2017  . Smoking greater than 30 pack years 02/26/2016  . Tobacco abuse 02/11/2016  . Tobacco user 02/11/2016  . Trichotillomania    hx.     Past Surgical History:  Procedure Laterality Date  . APPENDECTOMY    . EXCISION/RELEASE BURSA HIP Left 04/14/2018   Procedure: Excision trochanteric bursa left hip;  Surgeon: Susa Day,  MD;  Location: [facility];  Service: Orthopedics;  Laterality: Left;  60 mins  . HIP OPEN REDUCTION Right    ORIF  . LEFT HEART CATH AND CORONARY ANGIOGRAPHY N/A 12/31/2017   Procedure: LEFT HEART CATH AND CORONARY ANGIOGRAPHY;  Surgeon: Martinique, Peter M, MD;  Location: [facility];  Service: Cardiovascular;  Laterality: N/A;  . LUMBAR LAMINECTOMY/DECOMPRESSION MICRODISCECTOMY Left 05/10/2014   Procedure: MICRODISCECTOMY LUMBAR DECOMPRESSION L5-S1 LEFT ;  Surgeon: Johnn Hai, MD;  Location: [facility];  Service: Orthopedics;  Laterality: Left;  . ORIF WRIST FRACTURE Right    retained hardware    Current Medications: Current Meds  Medication Sig  . ALPRAZolam (XANAX) 0.5 MG tablet Take 0.5-1 mg by mouth every 8 (eight) hours as needed.  Marland Kitchen atorvastatin (LIPITOR) 40 MG tablet Take 40 mg by mouth at bedtime.  . cyanocobalamin (,VITAMIN B-12,) 1000 MCG/ML injection Inject 1,000 mcg into the muscle every 14 (fourteen) days.  Marland Kitchen FLUoxetine (PROZAC) 40 MG  capsule Take 80 mg by mouth daily.   . folic acid (FOLVITE) 1 MG tablet Take 1 mg by mouth at bedtime.   . gabapentin (NEURONTIN) 100 MG capsule Take 100 mg by mouth daily.  . insulin aspart (NOVOLOG FLEXPEN) 100 UNIT/ML FlexPen Novolog Flexpen U-100 Insulin aspart 100 unit/mL (3 mL) subcutaneous  ADM 10 UNI  WITH EACH MEAL UTD PER SLIDING SCALE. MAX D DOSE OF 25 UNI  . insulin degludec (TRESIBA FLEXTOUCH) 200 UNIT/ML FlexTouch Pen Inject 50 Units into the skin daily.  Marland Kitchen linaclotide (LINZESS) 290 MCG CAPS capsule Take 290 mcg by mouth at bedtime.   . nitroGLYCERIN (NITROSTAT) 0.4 MG SL tablet Place 1 tablet (0.4 mg total) under the tongue every 5 (five) minutes as needed.  Marland Kitchen oxyCODONE-acetaminophen (PERCOCET/ROXICET) 5-325 MG tablet Take 1 tablet by mouth 2 (two) times daily as needed.  . TRUE METRIX BLOOD GLUCOSE TEST test strip CHECK BLOOD SUGARS THREE TIMES DAILY     Allergies:   Celebrex [celecoxib] and Mobic [meloxicam]   Social History   Socioeconomic History  . Marital status: Divorced    Spouse name: Not on file  . Number of children: Not on file  . Years of education: Not on file  . Highest education level: Not on file  Occupational History  . Not on file  Tobacco Use  . Smoking status: Former Smoker    Packs/day: 0.50    Years: 30.00    Pack years: 15.00    Types: Cigarettes    Quit date: 09/29/2019    Years since quitting: 0.0  . Smokeless tobacco: Never Used  Vaping Use  . Vaping Use: Never used  Substance and Sexual Activity  . Alcohol use: Yes    Comment: weekends - social  . Drug use: No  . Sexual activity: Not on file  Other Topics Concern  . Not on file  Social History Narrative  . Not on file   Social Determinants of Health   Financial Resource Strain:   . Difficulty of Paying Living Expenses:   Food Insecurity:   . Worried About Charity fundraiser in the Last Year:   . Arboriculturist in the Last Year:   Transportation Needs:   . Lexicographer (Medical):   Marland Kitchen Lack of Transportation (Non-Medical):   Physical Activity:   . Days of Exercise per Week:   . Minutes of Exercise per Session:   Stress:   . Feeling of Stress :  Social Connections:   . Frequency of Communication with Friends and Family:   . Frequency of Social Gatherings with Friends and Family:   . Attends Religious Services:   . Active Member of Clubs or Organizations:   . Attends Archivist Meetings:   Marland Kitchen Marital Status:      Family History: The patient's family history includes Cancer in her mother; Diabetes in her father and mother; Heart disease in her father; Hypertension in her father.  ROS:   Please see the history of present illness.    All other systems reviewed and are negative.  EKGs/Labs/Other Studies Reviewed:    The following studies were reviewed today: IMPRESSION: 1. Coronary calcium score of 253. This was 96th percentile for age and sex matched control.  2. Normal coronary origin with right dominance.  3. Moderate (50-69%) plaque in the proximal LAD.  CAD-RADS 3.  4. Recommend aggressive risk factor modification including smoking cessation and high potency statin.  Skeet Latch, MD   Electronically Signed   By: Skeet Latch   On: 10/04/2019 21:47   Recent Labs: 10/03/2019: BUN 11; Creatinine, Ser 0.57; Potassium 4.8; Sodium 141 10/11/2019: ALT 67  Recent Lipid Panel    Component Value Date/Time   CHOL 186 10/11/2019 0950   TRIG 136 10/11/2019 0950   HDL 49 10/11/2019 0950   CHOLHDL 3.8 10/11/2019 0950   LDLCALC 113 (H) 10/11/2019 0950    Physical Exam:    VS:  BP 111/72   Pulse 74   Ht 5\' 2"  (1.575 m)   Wt 148 lb 3.2 oz (67.2 kg)   LMP 05/04/2008   SpO2 97%   BMI 27.11 kg/m     Wt Readings from Last 3 Encounters:  10/13/19 148 lb 3.2 oz (67.2 kg)  09/15/19 150 lb (68 kg)  04/14/18 144 lb (65.3 kg)     GEN: Patient is in no acute distress HEENT: Normal NECK: No JVD; No  carotid bruits LYMPHATICS: No lymphadenopathy CARDIAC: Hear sounds regular, 2/6 systolic murmur at the apex. RESPIRATORY:  Clear to auscultation without rales, wheezing or rhonchi  ABDOMEN: Soft, non-tender, non-distended MUSCULOSKELETAL:  No edema; No deformity  SKIN: Warm and dry NEUROLOGIC:  Alert and oriented x 3 PSYCHIATRIC:  Normal affect   Signed, [name], MD  10/13/2019 5:03 PM    [facility]

## 2019-10-13 NOTE — Telephone Encounter (Signed)
Left patient a detailed message letting her know that we would make an exception for her today to let her mother come back with her since she is going to be following up about her CT results. I also gave her our call back number for if she had any other issues or concerns.

## 2019-10-14 ENCOUNTER — Telehealth: Payer: Self-pay

## 2019-10-14 DIAGNOSIS — F429 Obsessive-compulsive disorder, unspecified: Secondary | ICD-10-CM

## 2019-10-14 HISTORY — DX: Obsessive-compulsive disorder, unspecified: F42.9

## 2019-10-14 NOTE — Telephone Encounter (Signed)
Went into chart looking for Clearance papers

## 2019-10-18 ENCOUNTER — Ambulatory Visit: Payer: BC Managed Care – PPO | Admitting: Cardiology

## 2019-10-22 DIAGNOSIS — M545 Low back pain, unspecified: Secondary | ICD-10-CM | POA: Insufficient documentation

## 2019-10-22 HISTORY — DX: Low back pain, unspecified: M54.50

## 2019-11-14 DIAGNOSIS — M65311 Trigger thumb, right thumb: Secondary | ICD-10-CM

## 2019-11-14 HISTORY — DX: Trigger thumb, right thumb: M65.311

## 2019-12-14 ENCOUNTER — Telehealth: Payer: BC Managed Care – PPO | Admitting: Internal Medicine

## 2019-12-28 ENCOUNTER — Other Ambulatory Visit: Payer: Self-pay

## 2019-12-29 ENCOUNTER — Telehealth: Payer: Self-pay

## 2019-12-29 NOTE — Telephone Encounter (Addendum)
   Primary Cardiologist: [name], MD  Chart reviewed as part of pre-operative protocol coverage. Patient was contacted 12/29/2019 in reference to pre-operative risk assessment for pending surgery as outlined below.  [name] was last seen on 10/13/2019 by Dr. Geraldo Pitter.  Since that day, [name] has done well without chest pain or worsening dyspnea. Recent coronary CT obtained in June 2021 showed moderate disease, but no severe lesion to interfere with the surgery.  Therefore, based on ACC/AHA guidelines, the patient would be at acceptable risk for the planned procedure without further cardiovascular testing.   The patient was advised that if she develops new symptoms prior to surgery to contact our office to arrange for a follow-up visit, and she verbalized understanding.  I will route this recommendation to the requesting party via Epic fax function and remove from pre-op pool. Please call with questions.  [place], [place] 12/29/2019, 5:38 PM

## 2019-12-29 NOTE — Telephone Encounter (Signed)
   [facility] Pre-operative Risk Assessment    HEARTCARE STAFF: - Please ensure there is not already an duplicate clearance open for this procedure. - Under Visit Info/Reason for Call, type in Other and utilize the format Clearance MM/DD/YY or Clearance TBD. Do not use dashes or single digits. - If request is for dental extraction, please clarify the # of teeth to be extracted.  Request for surgical clearance:  1. What type of surgery is being performed? ALIF L5-S1, Lt L2-3 foraminotomy/disectom   2. When is this surgery scheduled? 01/11/2020   3. What type of clearance is required (medical clearance vs. Pharmacy clearance to hold med vs. Both)? Medical  4. Are there any medications that need to be held prior to surgery and how long?  None  5. Practice name and name of physician performing surgery? [facility], Dr. Melina Schools   6. What is the office phone number? [phone]   7.   What is the office fax number? [phone]  8.   Anesthesia type (None, local, MAC, general) ? General   [name] 12/29/2019, 11:17 AM  _________________________________________________________________   (provider comments below)

## 2020-01-03 ENCOUNTER — Ambulatory Visit: Payer: Self-pay | Admitting: Orthopedic Surgery

## 2020-01-03 NOTE — H&P (Signed)
Subjective:   [name] is a pleasant 59 year old female with past medical history significant for tobacco use disorder, CAD (as cardiologist, takes a baby aspirin) and diabetes (last A1c was below 8), Previous L5-S1 discectomy. The patient is here today for a pre-operative History and Physical. She continues to complain of significant low back pain as well as Left Lateral hip and anterior thigh pain. To date she has had: Previous lumbar L5-S1 decompression. Epidural steroid injections, SI joint injections, formalized physical therapy, intra-articular hip injections, narcotic and nonnarcotic medications, and activity restrictions. Imaging studies confirm degenerative lumbar disc disease at L5-S1 consistent with postlaminectomy syndrome. At this point time having failed conservative care, and confirm the pain source with an intradiscal injection I believe it is reasonable to move forward with surgical intervention for her degenerative lumbar disc disease. They are scheduled for ALIF L5-S1, L2-3 foraminotomy/discectomy on 01-11-20 with Dr. Rolena Infante at [facility].  Patient Active Problem List   Diagnosis Date Noted  . Diabetes mellitus due to underlying condition with unspecified complications ([place]) 81/82/9937  . Primary osteoarthritis involving multiple joints 08/24/2019  . Alcohol use 04/15/2019  . Elevated LFTs 03/10/2019  . Lumbar post-laminectomy syndrome 03/04/2019  . Acute bilateral thoracic back pain 12/03/2018  . Pain of left hip joint 07/23/2018  . Polyarthralgia 06/11/2018  . Bursitis of both hips 04/14/2018  . Foot pain 01/28/2018  . CAD (coronary artery disease) 12/24/2017  . Secondary diabetes mellitus ([place]) 12/24/2017  . Coronary arteriosclerosis 12/24/2017  . Dyspnea on exertion 12/04/2017  . Attention deficit 12/04/2017  . Intermittent claudication ([place]) 12/04/2017  . History of metabolic disorder 16/96/7893  . Lung mass 12/04/2017  . Myositis 12/04/2017  . Erythrocytosis  12/04/2017  . Senile purpura ([place]) 12/04/2017  . Chest pain 11/06/2017  . Multiple actinic keratoses 11/06/2017  . Degeneration of lumbar intervertebral disc 11/06/2017  . Fatigue 11/06/2017  . Hemangioma 11/06/2017  . Insulin long-term use ([place]) 11/06/2017  . Menopausal disorder 11/06/2017  . Purpura ([place]) 11/06/2017  . Moderate chronic obstructive pulmonary disease ([place]) 11/06/2017  . Folic acid deficiency 81/04/7508  . History of fracture 11/06/2017  . Malaise and fatigue 11/06/2017  . Arteriosclerotic vascular disease 11/06/2017  . Dysphagia 11/04/2017  . Sciatica 11/03/2017  . Scoliosis deformity of spine 11/03/2017  . Lumbar radiculopathy 05/26/2017  . Mixed hyperlipidemia 04/09/2016  . Smoking greater than 30 pack years 02/26/2016  . Cigarette smoker 02/26/2016  . Diabetes mellitus type 1, uncomplicated ([place]) 25/85/2778  . Tobacco abuse 02/11/2016  . Tobacco user 02/11/2016  . Coronary artery disease of native artery of native heart with stable angina pectoris ([place]) 01/25/2016  . Abnormal thyroid function test 12/06/2015  . Chronic bronchitis ([place]) 12/04/2015  . Overweight with body mass index (BMI) 25.0-29.9 07/11/2015  . Anxiety disorder 06/05/2015  . Atherosclerosis 06/05/2015  . Primary insomnia 06/05/2015  . Trichotillomania 06/05/2015  . HNP (herniated nucleus pulposus), lumbar 05/10/2014   Past Medical History:  Diagnosis Date  . Abnormal thyroid function test 12/06/2015  . Acute bilateral thoracic back pain 12/03/2018  . Alcohol use 04/15/2019   Formatting of this note might be different from the original. Recommend abstinence from alcohol  . Anxiety disorder 06/05/2015   Last Assessment & Plan:  Formatting of this note might be different from the original. Had started her on some xanax about week ago and she feels it has helped her and she is taking about half tablet for this and will continue with it as she has been  compliant with it  . Arteriosclerotic vascular  disease 11/06/2017  . Arthritis    back   . Atherosclerosis 06/05/2015   Formatting of this note might be different from the original. Seen on CTs of chest.  . Attention deficit 12/04/2017  . Bursitis of both hips 04/14/2018  . CAD (coronary artery disease) 12/24/2017  . Chest pain 11/06/2017  . Chronic bronchitis ([place]) 12/04/2015   Formatting of this note might be different from the original. PFT ratio 88% FEV1 103% FVC 94% DLCO 76%  Last Assessment & Plan:  Formatting of this note might be different from the original. We discussed this as she has been coughing again for about 2 mnths and she sounds congested on exam with previous ease for pneumonia will place her on levaquin and have reviewed with her the importance of her   . Cigarette smoker 02/26/2016  . Coronary arteriosclerosis 12/24/2017  . Coronary artery disease of native artery of native heart with stable angina pectoris ([place]) 01/25/2016   Formatting of this note might be different from the original. 2017:  50% LAD lesion.  Calcium noted other places.  Heart cath December 31, 2017 with a 30% ostial LAD lesion otherwise normal coronary arteries seen with LVEF 55 to 65%  . Degeneration of lumbar intervertebral disc 11/06/2017  . Diabetes mellitus type 1, uncomplicated ([place]) 85/88/5027   Added automatically from request for surgery [phone]  Formatting of this note might be different from the original. Added automatically from request for surgery [phone]  . Diabetes mellitus without complication (HCC)    Type 1- injections only- recent change to [place] with improved A1C reading.  Marland Kitchen Dysphagia 11/04/2017  . Dyspnea on exertion 12/04/2017  . Elevated LFTs 03/10/2019   Formatting of this note might be different from the original. History.  Cannot change to this.  . Erythrocytosis 12/04/2017  . Fatigue 11/06/2017  . Folic acid deficiency 0/94/7096  . Foot pain 01/28/2018  . Hemangioma 11/06/2017  . History of fracture 11/06/2017  . History of metabolic  disorder 06/05/3660  . HNP (herniated nucleus pulposus), lumbar 05/10/2014  . Insulin long-term use ([place]) 11/06/2017  . Intermittent claudication ([place]) 12/04/2017  . Lumbar post-laminectomy syndrome 03/04/2019  . Lumbar radiculopathy 05/26/2017  . Lung mass 12/04/2017  . Malaise and fatigue 11/06/2017  . Menopausal disorder 11/06/2017  . Mixed hyperlipidemia 04/09/2016   Formatting of this note might be different from the original. Added automatically from request for surgery [phone]  . Moderate chronic obstructive pulmonary disease ([employer]) 11/06/2017  . Multiple actinic keratoses 11/06/2017  . Myositis 12/04/2017  . Overweight with body mass index (BMI) 25.0-29.9 07/11/2015   Last Assessment & Plan:  Relevant Hx: Course: Daily Update: Today's Plan:difficult for her with the insulin as well as her abilify and the weight is increaseing for her rather than decreasing, she has cut her alcohol intake back, she has worked on her diet more with decreasing her carb intake as well and still difficulty discussed with the saxenda and will try to get this approved for her and see   . Pain of left hip joint 07/23/2018  . Polyarthralgia 06/11/2018  . Primary insomnia 06/05/2015  . Primary osteoarthritis involving multiple joints 08/24/2019  . Purpura ([place]) 11/06/2017  . Sciatica 11/03/2017  . Scoliosis deformity of spine 11/03/2017  . Secondary diabetes mellitus ([place]) 12/24/2017  . Senile purpura ([place]) 12/04/2017  . Smoking greater than 30 pack years 02/26/2016  . Tobacco abuse 02/11/2016  . Tobacco user 02/11/2016  .  Trichotillomania    hx.     Past Surgical History:  Procedure Laterality Date  . APPENDECTOMY  04/28/1970  . COLONOSCOPY  04/28/2017  . EXCISION/RELEASE BURSA HIP Left 04/14/2018   Procedure: Excision trochanteric bursa left hip;  Surgeon: Susa Day, MD;  Location: [facility];  Service: Orthopedics;  Laterality: Left;  60 mins  . HAND SURGERY    . HIP OPEN REDUCTION Right    ORIF  . KNEE ARTHROSCOPY    .  LEFT HEART CATH AND CORONARY ANGIOGRAPHY N/A 12/31/2017   Procedure: LEFT HEART CATH AND CORONARY ANGIOGRAPHY;  Surgeon: Martinique, Peter M, MD;  Location: [facility];  Service: Cardiovascular;  Laterality: N/A;  . LUMBAR LAMINECTOMY/DECOMPRESSION MICRODISCECTOMY Left 05/10/2014   Procedure: MICRODISCECTOMY LUMBAR DECOMPRESSION L5-S1 LEFT ;  Surgeon: Johnn Hai, MD;  Location: [facility];  Service: Orthopedics;  Laterality: Left;  . ORIF WRIST FRACTURE Right    retained hardware  . ORTHOPAEDIC SURGERY  04/14/2018  . TUBAL LIGATION  12/25/1995    Current Outpatient Medications  Medication Sig Dispense Refill Last Dose  . ALPRAZolam (XANAX) 0.5 MG tablet Take 0.5 mg by mouth at bedtime.      Marland Kitchen atorvastatin (LIPITOR) 40 MG tablet Take 40 mg by mouth at bedtime.     . cyanocobalamin (,VITAMIN B-12,) 1000 MCG/ML injection Inject 1,000 mcg into the muscle every 14 (fourteen) days.     Marland Kitchen FLUoxetine (PROZAC) 40 MG capsule Take 80 mg by mouth daily.      . folic acid (FOLVITE) 1 MG tablet Take 1 mg by mouth at bedtime.      . gabapentin (NEURONTIN) 100 MG capsule Take 100-300 mg by mouth See admin instructions. Take 100 mg by mouth in the morning and 300 mg at bedtime     . insulin aspart (NOVOLOG FLEXPEN) 100 UNIT/ML FlexPen Inject 3-4 Units into the skin 3 (three) times daily as needed (blood sugar above 200).      . insulin degludec (TRESIBA FLEXTOUCH) 200 UNIT/ML FlexTouch Pen Inject 32 Units into the skin daily.      Marland Kitchen linaclotide (LINZESS) 290 MCG CAPS capsule Take 290 mcg by mouth at bedtime.      . metoprolol tartrate (LOPRESSOR) 100 MG tablet Take 1 tablet (100 mg total) by mouth once for 1 dose. Take 2 hours prior to your CT scan as long as your heart rate is greater than 55. (Patient not taking: Reported on 12/30/2019) 1 tablet 0   . nitroGLYCERIN (NITROSTAT) 0.4 MG SL tablet Place 1 tablet (0.4 mg total) under the tongue every 5 (five) minutes as needed. 25 tablet 11   .  oxyCODONE-acetaminophen (PERCOCET/ROXICET) 5-325 MG tablet Take 1 tablet by mouth 2 (two) times daily as needed. (Patient not taking: Reported on 12/30/2019)     . TRUE METRIX BLOOD GLUCOSE TEST test strip CHECK BLOOD SUGARS THREE TIMES DAILY      No current facility-administered medications for this visit.   Allergies  Allergen Reactions  . Celebrex [Celecoxib] Swelling    Face swelling  . Mobic [Meloxicam] Swelling    Face swelling    Social History   Tobacco Use  . Smoking status: Current Some Day Smoker    Packs/day: 0.50    Years: 30.00    Pack years: 15.00    Types: Cigarettes    Last attempt to quit: 09/29/2019    Years since quitting: 0.2  . Smokeless tobacco: Never Used  Substance Use Topics  .  Alcohol use: Yes    Comment: weekends - social    Family History  Problem Relation Age of Onset  . Diabetes Mother   . Cancer Mother   . Hypertension Father   . Heart disease Father   . Diabetes Father     Review of Systems As stated in HPI  Objective:   Vitals: Ht: 5 ft 1.5 in BP: 123/69  General: Alert and oriented 3, no apparent distress  Psych: Normal mood and affect  Inspection: No obvious deformity  Heart: Regular rate and rhythm  Lungs: Clear to auscultation bilaterally  Abdomen: Bowel sounds 4, nondistended, nontender, no reboound tenderness no loss off bladder or bowel  She continues to have significant back buttock Pain and left anterior lateral thigh pain. 5/5 lower extremity motor strength bilaterally.  Lumbar MRI: completed on 10/24/19 was reviewed with the patient. [facility] was [facility] at [facility]; I have independently reviewed the images as well as the radiology report. Advanced degenerative lumbar disc disease L5-S1 with severe right-sided neuroforaminal stenosis. There is compression to the exiting L5 nerve root as well as the dorsal root ganglion. Mild anterior listhesis is noted. There is a left neuroforaminal disc protrusion at L2-3 causing  moderately severe L2 neuroforaminal stenosis with compression of the left L2 nerve root. Moderate degenerative changes at L3-4 and L4-5.  Assessment:   [name] is a pleasant 59 year old female with past medical history significant for tobacco use disorder, CAD (as cardiologist, takes a baby aspirin) and diabetes (last A1c was below 8), Previous L5-S1 discectomy. The patient is here today for a pre-operative History and Physical. She continues to complain of significant low back pain as well as Left Lateral hip and anterior thigh pain. To date she has had: Previous lumbar L5-S1 decompression. Epidural steroid injections, SI joint injections, formalized physical therapy, intra-articular hip injections, narcotic and nonnarcotic medications, and activity restrictions. Imaging studies confirm degenerative lumbar disc disease at L5-S1 consistent with postlaminectomy syndrome. At this point time having failed conservative care, and confirm the pain source with an intradiscal injection I believe it is reasonable to move forward with surgical intervention for her degenerative lumbar disc disease.  Plan:   Anterior lumbar interbody fusion L5-S1 with left L2-3 foraminotomies/discectomy  We have obtained clearance from the patient's primary care provider as well as her cardiologist.  Patient is scheduled to meet with vascular surgery later this week  I have reviewed the patient's medication list with her. She does take aspirin daily which she will hold one week prior to surgery and resume postoperatively. She will also hold naproxen. She may take Tylenol as needed for pain.  Patient has appointment with PT to get scheduled for an LSO brace  We have also discussed the post-operative recovery period to include: bathing/showering restrictions, wound healing, activity (and driving) restrictions, medications/pain mangement.  We have also discussed post-operative redflags to include: signs and symptoms of  postoperative infection, DVT/PE.  All patients questions were invited and answered  Follow-up: 2 weeks postop

## 2020-01-06 NOTE — Pre-Procedure Instructions (Signed)
[name]  01/06/2020      [id number] - [place], [address] AT [address] [place] [address] Phone: [phone] Fax: [phone]    Your procedure is scheduled on Sept. 15  Report to [facility] Entrance A at 9:30 A.M.  Call this number if you have problems the morning of surgery:  [phone]   Remember:  Do not eat or drink after midnight.      Take these medicines the morning of surgery with A SIP OF WATER :              Fluoxetine (prozac)             Gabapentin (neurontin)              If needed: nitroglycerine         7 days prior to surgery STOP taking any Aspirin (unless otherwise instructed by your surgeon), Aleve, Naproxen, Ibuprofen, Motrin, Advil, Goody's, BC's, all herbal medications, fish oil, and all vitamins.                     How to Manage Your Diabetes Before and After Surgery  Why is it important to control my blood sugar before and after surgery? . Improving blood sugar levels before and after surgery helps healing and can limit problems. . A way of improving blood sugar control is eating a healthy diet by: o  Eating less sugar and carbohydrates o  Increasing activity/exercise o  Talking with your doctor about reaching your blood sugar goals . High blood sugars (greater than 180 mg/dL) can raise your risk of infections and slow your recovery, so you will need to focus on controlling your diabetes during the weeks before surgery. . Make sure that the doctor who takes care of your diabetes knows about your planned surgery including the date and location.  How do I manage my blood sugar before surgery? . Check your blood sugar at least 4 times a day, starting 2 days before surgery, to make sure that the level is not too high or low. o Check your blood sugar the morning of your surgery when you wake up and every 2 hours until you get to the Short Stay unit. . If your  blood sugar is less than 70 mg/dL, you will need to treat for low blood sugar: o Do not take insulin. o Treat a low blood sugar (less than 70 mg/dL) with  cup of clear juice (cranberry or apple), 4 glucose tablets, OR glucose gel. Recheck blood sugar in 15 minutes after treatment (to make sure it is greater than 70 mg/dL). If your blood sugar is not greater than 70 mg/dL on recheck, call [phone] o  for further instructions. . Report your blood sugar to the short stay nurse when you get to Short Stay.  . If you are admitted to the hospital after surgery: o Your blood sugar will be checked by the staff and you will probably be given insulin after surgery (instead of oral diabetes medicines) to make sure you have good blood sugar levels. o The goal for blood sugar control after surgery is 80-180 mg/dL.      WHAT DO I DO ABOUT MY DIABETES MEDICATION?  Marland Kitchen Do not take oral diabetes medicines (pills) the morning of surgery.      . THE MORNING OF SURGERY, take ______16_______ units  of ____Tresiba______insulin.    . If your CBG is greater than 220 mg/dL, you may take  of your sliding scale (correction) dose of insulin.  Other Instructions:                      Do not wear jewelry, make-up or nail polish.  Do not wear lotions, powders, or perfumes, or deodorant.  Do not shave 48 hours prior to surgery.  Men may shave face and neck.  Do not bring valuables to the hospital.  [facility] is not responsible for any belongings or valuables.  Contacts, dentures or bridgework may not be worn into surgery.  Leave your suitcase in the car.  After surgery it may be brought to your room.  For patients admitted to the hospital, discharge time will be determined by your treatment team.  Patients discharged the day of surgery will not be allowed to drive home.    Special instructions:  [place]- Preparing For Surgery  Before surgery, you can play an important role. Because skin is  not sterile, your skin needs to be as free of germs as possible. You can reduce the number of germs on your skin by washing with CHG (chlorahexidine gluconate) Soap before surgery.  CHG is an antiseptic cleaner which kills germs and bonds with the skin to continue killing germs even after washing.    Oral Hygiene is also important to reduce your risk of infection.  Remember - BRUSH YOUR TEETH THE MORNING OF SURGERY WITH YOUR REGULAR TOOTHPASTE  Please do not use if you have an allergy to CHG or antibacterial soaps. If your skin becomes reddened/irritated stop using the CHG.  Do not shave (including legs and underarms) for at least 48 hours prior to first CHG shower. It is OK to shave your face.  Please follow these instructions carefully.   1. Shower the NIGHT BEFORE SURGERY and the MORNING OF SURGERY with CHG.   2. If you chose to wash your hair, wash your hair first as usual with your normal shampoo.  3. After you shampoo, rinse your hair and body thoroughly to remove the shampoo.  4. Use CHG as you would any other liquid soap. You can apply CHG directly to the skin and wash gently with a scrungie or a clean washcloth.   5. Apply the CHG Soap to your body ONLY FROM THE NECK DOWN.  Do not use on open wounds or open sores. Avoid contact with your eyes, ears, mouth and genitals (private parts). Wash Face and genitals (private parts)  with your normal soap.  6. Wash thoroughly, paying special attention to the area where your surgery will be performed.  7. Thoroughly rinse your body with warm water from the neck down.  8. DO NOT shower/wash with your normal soap after using and rinsing off the CHG Soap.  9. Pat yourself dry with a CLEAN TOWEL.  10. Wear CLEAN PAJAMAS to bed the night before surgery, wear comfortable clothes the morning of surgery  11. Place CLEAN SHEETS on your bed the night of your first shower and DO NOT SLEEP WITH PETS.    Day of Surgery:  Do not apply any  deodorants/lotions.  Please wear clean clothes to the hospital/surgery center.   Remember to brush your teeth WITH YOUR REGULAR TOOTHPASTE.    Please read over the following fact sheets that you were given.

## 2020-01-09 ENCOUNTER — Other Ambulatory Visit: Payer: Self-pay

## 2020-01-09 ENCOUNTER — Encounter: Payer: Self-pay | Admitting: Vascular Surgery

## 2020-01-09 ENCOUNTER — Encounter (HOSPITAL_COMMUNITY)
Admission: RE | Admit: 2020-01-09 | Discharge: 2020-01-09 | Disposition: A | Discharge status: Home / Self Care | Payer: BC Managed Care – PPO | Source: Ambulatory Visit | Attending: Orthopedic Surgery | Admitting: Orthopedic Surgery

## 2020-01-09 ENCOUNTER — Other Ambulatory Visit (HOSPITAL_COMMUNITY)
Admission: RE | Admit: 2020-01-09 | Discharge: 2020-01-09 | Disposition: A | Discharge status: Home / Self Care | Payer: BC Managed Care – PPO | Source: Ambulatory Visit | Attending: Orthopedic Surgery | Admitting: Orthopedic Surgery

## 2020-01-09 ENCOUNTER — Encounter (HOSPITAL_COMMUNITY): Payer: Self-pay

## 2020-01-09 ENCOUNTER — Ambulatory Visit: Payer: BC Managed Care – PPO | Admitting: Vascular Surgery

## 2020-01-09 ENCOUNTER — Ambulatory Visit (HOSPITAL_COMMUNITY)
Admission: RE | Admit: 2020-01-09 | Discharge: 2020-01-09 | Disposition: A | Discharge status: Home / Self Care | Payer: BC Managed Care – PPO | Source: Ambulatory Visit | Attending: Orthopedic Surgery | Admitting: Orthopedic Surgery

## 2020-01-09 VITALS — BP 101/66 | HR 76 | Temp 98.2°F | Resp 14 | Ht 62.0 in | Wt 150.0 lb

## 2020-01-09 DIAGNOSIS — Z01812 Encounter for preprocedural laboratory examination: Secondary | ICD-10-CM | POA: Insufficient documentation

## 2020-01-09 DIAGNOSIS — Z20822 Contact with and (suspected) exposure to covid-19: Secondary | ICD-10-CM | POA: Insufficient documentation

## 2020-01-09 DIAGNOSIS — Z01818 Encounter for other preprocedural examination: Secondary | ICD-10-CM | POA: Insufficient documentation

## 2020-01-09 DIAGNOSIS — M5137 Other intervertebral disc degeneration, lumbosacral region: Secondary | ICD-10-CM

## 2020-01-09 HISTORY — DX: Pneumonia, unspecified organism: J18.9

## 2020-01-09 LAB — APTT: aPTT: 29 seconds (ref 24–36)

## 2020-01-09 LAB — PROTIME-INR
INR: 1.1 (ref 0.8–1.2)
Prothrombin Time: 13.5 seconds (ref 11.4–15.2)

## 2020-01-09 LAB — TYPE AND SCREEN
ABO/RH(D): A POS
Antibody Screen: NEGATIVE

## 2020-01-09 LAB — SURGICAL PCR SCREEN
MRSA, PCR: NEGATIVE
Staphylococcus aureus: NEGATIVE

## 2020-01-09 LAB — BASIC METABOLIC PANEL
Anion gap: 9 (ref 5–15)
BUN: 10 mg/dL (ref 6–20)
CO2: 25 mmol/L (ref 22–32)
Calcium: 9.3 mg/dL (ref 8.9–10.3)
Chloride: 105 mmol/L (ref 98–111)
Creatinine, Ser: 0.64 mg/dL (ref 0.44–1.00)
GFR calc Af Amer: >60 mL/min (ref >60)
GFR calc non Af Amer: >60 mL/min (ref >60)
Glucose, Bld: 123 mg/dL — ABNORMAL HIGH (ref 70–99)
Potassium: 4 mmol/L (ref 3.5–5.1)
Sodium: 139 mmol/L (ref 135–145)

## 2020-01-09 LAB — URINALYSIS, ROUTINE W REFLEX MICROSCOPIC
Bilirubin Urine: NEGATIVE
Glucose, UA: NEGATIVE mg/dL
Hgb urine dipstick: NEGATIVE
Ketones, ur: NEGATIVE mg/dL
Leukocytes,Ua: NEGATIVE
Nitrite: NEGATIVE
Protein, ur: NEGATIVE mg/dL
Specific Gravity, Urine: 1.024 (ref 1.005–1.030)
pH: 5 (ref 5.0–8.0)

## 2020-01-09 LAB — CBC
HCT: 45 % (ref 36.0–46.0)
Hemoglobin: 14.8 g/dL (ref 12.0–15.0)
MCH: 31.6 pg (ref 26.0–34.0)
MCHC: 32.9 g/dL (ref 30.0–36.0)
MCV: 96.2 fL (ref 80.0–100.0)
Platelets: 214 10*3/uL (ref 150–400)
RBC: 4.68 MIL/uL (ref 3.87–5.11)
RDW: 13.2 % (ref 11.5–15.5)
WBC: 9.8 10*3/uL (ref 4.0–10.5)
nRBC: 0 % (ref 0.0–0.2)

## 2020-01-09 LAB — SARS CORONAVIRUS 2 (TAT 6-24 HRS): SARS Coronavirus 2: NEGATIVE

## 2020-01-09 LAB — HEMOGLOBIN A1C
Hgb A1c MFr Bld: 8.1 % — ABNORMAL HIGH (ref 4.8–5.6)
Mean Plasma Glucose: 185.77 mg/dL

## 2020-01-09 LAB — GLUCOSE, CAPILLARY: Glucose-Capillary: 147 mg/dL — ABNORMAL HIGH (ref 70–99)

## 2020-01-09 NOTE — Progress Notes (Signed)
PCP - Dr. Bea Graff in [place] - Dr. Geraldo Pitter in [place]  Chest x-ray - 01/09/20 EKG - 09/15/19 Stress Test - Four years ago ECHO - Denies Cardiac Cath - 01/10/18  Sleep Study - Denies OSA  DM - Type I checks blood sugars 3-4/week. Ranges 60 - 200  COVID TEST- 01/09/20  Anesthesia review: Yes per request of Doctor  Patient denies shortness of breath, fever, cough and chest pain at PAT appointment   All instructions explained to the patient, with a verbal understanding of the material. Patient agrees to go over the instructions while at home for a better understanding. Patient also instructed to self quarantine after being tested for COVID-19. The opportunity to ask questions was provided.

## 2020-01-09 NOTE — Progress Notes (Signed)
Vascular and Vein Specialist of [place]  Patient name: [name] MRN: [id number] DOB: 08/23/1960 Sex: female  REASON FOR CONSULT: Discuss anterior exposure for L5-S1 fusion with Dr. Rolena Infante  HPI: [name] is a 59 y.o. female, who is here today for discussion of my role for anterior exposure.  She has a long history of progressive lower extremity pain.  She has degenerative disc disease and has had imaging suggesting T level disease.  She has failed conservative therapy and Dr. Rolena Infante is recommended multilevel surgical treatment with fusion from anterior approach and L5-S1 level..  She does have prior history of coronary angioplasty.  Past Medical History:  Diagnosis Date  . Abnormal thyroid function test 12/06/2015  . Acute bilateral thoracic back pain 12/03/2018  . Alcohol use 04/15/2019   Formatting of this note might be different from the original. Recommend abstinence from alcohol  . Anxiety disorder 06/05/2015   Last Assessment & Plan:  Formatting of this note might be different from the original. Had started her on some xanax about week ago and she feels it has helped her and she is taking about half tablet for this and will continue with it as she has been compliant with it  . Arteriosclerotic vascular disease 11/06/2017  . Arthritis    back   . Atherosclerosis 06/05/2015   Formatting of this note might be different from the original. Seen on CTs of chest.  . Attention deficit 12/04/2017  . Bursitis of both hips 04/14/2018  . CAD (coronary artery disease) 12/24/2017  . Chest pain 11/06/2017  . Chronic bronchitis ([place]) 12/04/2015   Formatting of this note might be different from the original. PFT ratio 88% FEV1 103% FVC 94% DLCO 76%  Last Assessment & Plan:  Formatting of this note might be different from the original. We discussed this as she has been coughing again for about 2 mnths and she sounds congested on exam with previous ease for  pneumonia will place her on levaquin and have reviewed with her the importance of her   . Cigarette smoker 02/26/2016  . Coronary arteriosclerosis 12/24/2017  . Coronary artery disease of native artery of native heart with stable angina pectoris ([place]) 01/25/2016   Formatting of this note might be different from the original. 2017:  50% LAD lesion.  Calcium noted other places.  Heart cath December 31, 2017 with a 30% ostial LAD lesion otherwise normal coronary arteries seen with LVEF 55 to 65%  . Degeneration of lumbar intervertebral disc 11/06/2017  . Diabetes mellitus type 1, uncomplicated ([place]) 78/46/9629   Added automatically from request for surgery [phone]  Formatting of this note might be different from the original. Added automatically from request for surgery [id number]  . Diabetes mellitus without complication (HCC)    Type 1- injections only- recent change to Toujeo with improved A1C reading.  Marland Kitchen Dysphagia 11/04/2017  . Dyspnea on exertion 12/04/2017  . Elevated LFTs 03/10/2019   Formatting of this note might be different from the original. History.  Cannot change to this.  . Erythrocytosis 12/04/2017  . Fatigue 11/06/2017  . Folic acid deficiency 2/72/5366  . Foot pain 01/28/2018  . Hemangioma 11/06/2017  . History of fracture 11/06/2017  . History of metabolic disorder 07/30/345  . HNP (herniated nucleus pulposus), lumbar 05/10/2014  . Insulin long-term use ([place]) 11/06/2017  . Intermittent claudication ([place]) 12/04/2017  . Lumbar post-laminectomy syndrome 03/04/2019  . Lumbar radiculopathy 05/26/2017  . Lung mass 12/04/2017  .  Malaise and fatigue 11/06/2017  . Menopausal disorder 11/06/2017  . Mixed hyperlipidemia 04/09/2016   Formatting of this note might be different from the original. Added automatically from request for surgery [phone]  . Moderate chronic obstructive pulmonary disease ([place]) 11/06/2017  . Multiple actinic keratoses 11/06/2017  . Myositis 12/04/2017  . Overweight with body mass index  (BMI) 25.0-29.9 07/11/2015   Last Assessment & Plan:  Relevant Hx: Course: Daily Update: Today's Plan:difficult for her with the insulin as well as her abilify and the weight is increaseing for her rather than decreasing, she has cut her alcohol intake back, she has worked on her diet more with decreasing her carb intake as well and still difficulty discussed with the saxenda and will try to get this approved for her and see   . Pain of left hip joint 07/23/2018  . Pneumonia   . Polyarthralgia 06/11/2018  . Primary insomnia 06/05/2015  . Primary osteoarthritis involving multiple joints 08/24/2019  . Purpura ([place]) 11/06/2017  . Respiratory failure ([place]) 11/2018  . Sciatica 11/03/2017  . Scoliosis deformity of spine 11/03/2017  . Secondary diabetes mellitus ([place]) 12/24/2017  . Senile purpura ([place]) 12/04/2017  . Smoking greater than 30 pack years 02/26/2016  . Tobacco abuse 02/11/2016  . Tobacco user 02/11/2016  . Trichotillomania    hx.     Family History  Problem Relation Age of Onset  . Diabetes Mother   . Cancer Mother   . Hypertension Father   . Heart disease Father   . Diabetes Father     SOCIAL HISTORY: Social History   Socioeconomic History  . Marital status: Divorced    Spouse name: Not on file  . Number of children: Not on file  . Years of education: Not on file  . Highest education level: Not on file  Occupational History  . Not on file  Tobacco Use  . Smoking status: Current Some Day Smoker    Packs/day: 0.50    Years: 30.00    Pack years: 15.00    Types: Cigarettes  . Smokeless tobacco: Never Used  Vaping Use  . Vaping Use: Never used  Substance and Sexual Activity  . Alcohol use: Yes    Comment: weekends - social  . Drug use: No  . Sexual activity: Not Currently  Other Topics Concern  . Not on file  Social History Narrative  . Not on file   Social Determinants of Health   Financial Resource Strain:   . Difficulty of Paying Living Expenses: Not on file  Food  Insecurity:   . Worried About Charity fundraiser in the Last Year: Not on file  . Ran Out of Food in the Last Year: Not on file  Transportation Needs:   . Lack of Transportation (Medical): Not on file  . Lack of Transportation (Non-Medical): Not on file  Physical Activity:   . Days of Exercise per Week: Not on file  . Minutes of Exercise per Session: Not on file  Stress:   . Feeling of Stress : Not on file  Social Connections:   . Frequency of Communication with Friends and Family: Not on file  . Frequency of Social Gatherings with Friends and Family: Not on file  . Attends Religious Services: Not on file  . Active Member of Clubs or Organizations: Not on file  . Attends Archivist Meetings: Not on file  . Marital Status: Not on file  Intimate Partner Violence:   . Fear  of Current or Ex-Partner: Not on file  . Emotionally Abused: Not on file  . Physically Abused: Not on file  . Sexually Abused: Not on file    Allergies  Allergen Reactions  . Celebrex [Celecoxib] Swelling    Face swelling  . Mobic [Meloxicam] Swelling    Face swelling    Current Outpatient Medications  Medication Sig Dispense Refill  . ALPRAZolam (XANAX) 0.5 MG tablet Take 0.5 mg by mouth at bedtime.     Marland Kitchen atorvastatin (LIPITOR) 40 MG tablet Take 40 mg by mouth at bedtime.    . cyanocobalamin (,VITAMIN B-12,) 1000 MCG/ML injection Inject 1,000 mcg into the muscle every 14 (fourteen) days.    Marland Kitchen FLUoxetine (PROZAC) 40 MG capsule Take 80 mg by mouth daily.     . folic acid (FOLVITE) 1 MG tablet Take 1 mg by mouth at bedtime.     . gabapentin (NEURONTIN) 100 MG capsule Take 100-300 mg by mouth See admin instructions. Take 100 mg by mouth in the morning and 300 mg at bedtime    . insulin aspart (NOVOLOG FLEXPEN) 100 UNIT/ML FlexPen Inject 3-4 Units into the skin 3 (three) times daily as needed (blood sugar above 200).     . insulin degludec (TRESIBA FLEXTOUCH) 200 UNIT/ML FlexTouch Pen Inject 34 Units  into the skin daily.     Marland Kitchen linaclotide (LINZESS) 290 MCG CAPS capsule Take 290 mcg by mouth at bedtime.     . nitroGLYCERIN (NITROSTAT) 0.4 MG SL tablet Place 1 tablet (0.4 mg total) under the tongue every 5 (five) minutes as needed. 25 tablet 11  . TRUE METRIX BLOOD GLUCOSE TEST test strip CHECK BLOOD SUGARS THREE TIMES DAILY     No current facility-administered medications for this visit.    REVIEW OF SYSTEMS:  [X]  denotes positive finding, [ ]  denotes negative finding Cardiac  Comments:  Chest pain or chest pressure:    Shortness of breath upon exertion:    Short of breath when lying flat:    Irregular heart rhythm:        Vascular    Pain in calf, thigh, or hip brought on by ambulation:    Pain in feet at night that wakes you up from your sleep:     Blood clot in your veins:    Leg swelling:         Pulmonary    Oxygen at home:    Productive cough:     Wheezing:         Neurologic    Sudden weakness in arms or legs:     Sudden numbness in arms or legs:     Sudden onset of difficulty speaking or slurred speech:    Temporary loss of vision in one eye:     Problems with dizziness:         Gastrointestinal    Blood in stool:     Vomited blood:         Genitourinary    Burning when urinating:     Blood in urine:        Psychiatric    Major depression:         Hematologic    Bleeding problems:    Problems with blood clotting too easily:        Skin    Rashes or ulcers:        Constitutional    Fever or chills:      PHYSICAL EXAM: Vitals:   01/09/20  1341  BP: 101/66  Pulse: 76  Resp: 14  Temp: 98.2 F (36.8 C)  TempSrc: Temporal  SpO2: 98%  Weight: 150 lb (68 kg)  Height: 5\' 2"  (1.575 m)    GENERAL: The patient is a well-nourished female, in no acute distress. The vital signs are documented above. CARDIOVASCULAR: Carotid arteries without bruits bilaterally.  2+ radial and 2+ dorsalis pedis pulses bilaterally PULMONARY: There is good air exchange    ABDOMEN: Soft and non-tender  MUSCULOSKELETAL: There are no major deformities or cyanosis. NEUROLOGIC: No focal weakness or paresthesias are detected. SKIN: There are no ulcers or rashes noted. PSYCHIATRIC: The patient has a normal affect.  DATA:  MRI was reviewed and also plain films.  She does have very scattered slight iliac calcifications.  Normal location of her aortic and caval bifurcation  MEDICAL ISSUES: I discussed my role in anterior exposure.  Discussed mobilization of the rectus muscle, intraperitoneal contents, left ureter and arterial and venous structures overlying the spine.  Discussed potential for injury of all of these in particular discussed venous injury.  Her only prior abdominal surgery was appendectomy.  She is not obese.  She has minimal atherosclerotic change in her imaging preoperatively.  I do not see any contraindication for anterior approach.  Surgery is scheduled for 01/11/2020   Rosetta Posner, MD [facility] Vascular and Vein Specialists of [facility] Tel [phone] Pager [phone]

## 2020-01-09 NOTE — Progress Notes (Signed)
Anesthesia Chart Review:   Case: [id number] Date/Time: 01/11/20 1115   Procedures:      ANTERIOR LUMBAR FUSION (ALIF) L5-S1, LEFT L2-3 FORAMINOTOMY, DISECTOMY (N/A ) - 4 hrs Dr. Donnetta Hutching to do approach Left tap block with exparel     ABDOMINAL EXPOSURE (N/A )   Anesthesia type: General   Pre-op diagnosis: Degenerative disc disease post laminectomy syndrome L5-S1, L2-3 foraminal herniated disc   Location: [facility] OR ROOM 18 / [place] OR   Surgeons: Melina Schools, MD; Rosetta Posner, MD      DISCUSSION: Pt is 59 years old with hx CAD (moderate nonobstructive by 10/04/19 coronary CT), type 1 DM, COPD. Current smoker, alcohol use.   Pt has cardiology clearance for surgery (see 12/29/19 note in epic).   Pt has medical clearance for surgery from Dr. Bea Graff (10/14/19 note in care everywhere). He comments "Her blood sugars should be watched carefully as she is type I diabetic. Aggressive pulmonary toilet with incentive spirometry is recommended, and watching for bronchospasm were to occur. Early ambulation as possible with DVT prevention per her surgeons protocol"   VS: BP 125/61   Pulse 72   Temp 36.7 C (Oral)   Resp 17   Ht 5\' 2"  (1.575 m)   Wt 68.2 kg   LMP 05/04/2008   SpO2 96%   BMI 27.49 kg/m    PROVIDERS: - PCP is [name], MD  (notes in care everywhere) - Cardiologist is Jyl Heinz, MD. Last office visit 01/13/20. Pt cleared for surgery at acceptable risk by Almyra Deforest, PA on 12/29/19   LABS:  - HbA1c 8.1. I left voicemail with this result for [facility] in Dr. Rolena Infante' office  (all labs ordered are listed, but only abnormal results are displayed)  Labs Reviewed  GLUCOSE, CAPILLARY - Abnormal; Notable for the following components:      Result Value   Glucose-Capillary 147 (*)    All other components within normal limits  BASIC METABOLIC PANEL - Abnormal; Notable for the following components:   Glucose, Bld 123 (*)    All other components within normal limits  URINALYSIS, ROUTINE W  REFLEX MICROSCOPIC - Abnormal; Notable for the following components:   APPearance HAZY (*)    All other components within normal limits  HEMOGLOBIN A1C - Abnormal; Notable for the following components:   Hgb A1c MFr Bld 8.1 (*)    All other components within normal limits  SURGICAL PCR SCREEN  APTT  CBC  PROTIME-INR  TYPE AND SCREEN     IMAGES: CXR 01/09/20: Negative two view chest x-ray.   CT coronary morphology 10/04/19:  1. Coronary calcium score of 253. This was 96th percentile for age and sex matched control. 2. Normal coronary origin with right dominance. 3. Moderate (50-69%) plaque in the proximal LAD.  CAD-RADS 3. 4. Recommend aggressive risk factor modification including smoking cessation and high potency statin.   EKG 09/15/19: NSR   CV: Cardiac cath 12/31/17:   Ost LAD lesion is 30% stenosed.  The left ventricular systolic function is normal.  LV end diastolic pressure is normal.  The left ventricular ejection fraction is 55-65% by visual estimate. 1. Mild nonobstructive CAD at the ostium of the LAD- 30%  2. Otherwise normal coronary anatomy 3. Normal LV function 4. Normal LVEDP    Past Medical History:  Diagnosis Date  . Abnormal thyroid function test 12/06/2015  . Acute bilateral thoracic back pain 12/03/2018  . Alcohol use 04/15/2019   Formatting of this  note might be different from the original. Recommend abstinence from alcohol  . Anxiety disorder 06/05/2015   Last Assessment & Plan:  Formatting of this note might be different from the original. Had started her on some xanax about week ago and she feels it has helped her and she is taking about half tablet for this and will continue with it as she has been compliant with it  . Arteriosclerotic vascular disease 11/06/2017  . Arthritis    back   . Atherosclerosis 06/05/2015   Formatting of this note might be different from the original. Seen on CTs of chest.  . Attention deficit 12/04/2017  . Bursitis of both  hips 04/14/2018  . CAD (coronary artery disease) 12/24/2017  . Chest pain 11/06/2017  . Chronic bronchitis ([place]) 12/04/2015   Formatting of this note might be different from the original. PFT ratio 88% FEV1 103% FVC 94% DLCO 76%  Last Assessment & Plan:  Formatting of this note might be different from the original. We discussed this as she has been coughing again for about 2 mnths and she sounds congested on exam with previous ease for pneumonia will place her on levaquin and have reviewed with her the importance of her   . Cigarette smoker 02/26/2016  . Coronary arteriosclerosis 12/24/2017  . Coronary artery disease of native artery of native heart with stable angina pectoris ([place]) 01/25/2016   Formatting of this note might be different from the original. 2017:  50% LAD lesion.  Calcium noted other places.  Heart cath December 31, 2017 with a 30% ostial LAD lesion otherwise normal coronary arteries seen with LVEF 55 to 65%  . Degeneration of lumbar intervertebral disc 11/06/2017  . Diabetes mellitus type 1, uncomplicated ([place]) 29/79/8921   Added automatically from request for surgery [phone]  Formatting of this note might be different from the original. Added automatically from request for surgery [phone]  . Diabetes mellitus without complication (HCC)    Type 1- injections only- recent change to Toujeo with improved A1C reading.  Marland Kitchen Dysphagia 11/04/2017  . Dyspnea on exertion 12/04/2017  . Elevated LFTs 03/10/2019   Formatting of this note might be different from the original. History.  Cannot change to this.  . Erythrocytosis 12/04/2017  . Fatigue 11/06/2017  . Folic acid deficiency 1/85/6314  . Foot pain 01/28/2018  . Hemangioma 11/06/2017  . History of fracture 11/06/2017  . History of metabolic disorder 01/02/262  . HNP (herniated nucleus pulposus), lumbar 05/10/2014  . Insulin long-term use ([place]) 11/06/2017  . Intermittent claudication ([place]) 12/04/2017  . Lumbar post-laminectomy syndrome 03/04/2019  .  Lumbar radiculopathy 05/26/2017  . Lung mass 12/04/2017  . Malaise and fatigue 11/06/2017  . Menopausal disorder 11/06/2017  . Mixed hyperlipidemia 04/09/2016   Formatting of this note might be different from the original. Added automatically from request for surgery [phone]  . Moderate chronic obstructive pulmonary disease ([place]) 11/06/2017  . Multiple actinic keratoses 11/06/2017  . Myositis 12/04/2017  . Overweight with body mass index (BMI) 25.0-29.9 07/11/2015   Last Assessment & Plan:  Relevant Hx: Course: Daily Update: Today's Plan:difficult for her with the insulin as well as her abilify and the weight is increaseing for her rather than decreasing, she has cut her alcohol intake back, she has worked on her diet more with decreasing her carb intake as well and still difficulty discussed with the saxenda and will try to get this approved for her and see   . Pain of left hip  joint 07/23/2018  . Pneumonia   . Polyarthralgia 06/11/2018  . Primary insomnia 06/05/2015  . Primary osteoarthritis involving multiple joints 08/24/2019  . Purpura ([place]) 11/06/2017  . Respiratory failure ([place]) 11/2018  . Sciatica 11/03/2017  . Scoliosis deformity of spine 11/03/2017  . Secondary diabetes mellitus ([place]) 12/24/2017  . Senile purpura ([place]) 12/04/2017  . Smoking greater than 30 pack years 02/26/2016  . Tobacco abuse 02/11/2016  . Tobacco user 02/11/2016  . Trichotillomania    hx.     Past Surgical History:  Procedure Laterality Date  . APPENDECTOMY  04/28/1970  . BACK SURGERY  2016  . CARDIAC CATHETERIZATION  12/31/2017  . COLONOSCOPY  04/28/2017  . EXCISION/RELEASE BURSA HIP Left 04/14/2018   Procedure: Excision trochanteric bursa left hip;  Surgeon: Susa Day, MD;  Location: [facility];  Service: Orthopedics;  Laterality: Left;  60 mins  . HAND SURGERY    . HIP OPEN REDUCTION Right    ORIF  . KNEE ARTHROSCOPY    . LEFT HEART CATH AND CORONARY ANGIOGRAPHY N/A 12/31/2017   Procedure: LEFT HEART CATH AND  CORONARY ANGIOGRAPHY;  Surgeon: Martinique, Peter M, MD;  Location: [place];  Service: Cardiovascular;  Laterality: N/A;  . LUMBAR LAMINECTOMY/DECOMPRESSION MICRODISCECTOMY Left 05/10/2014   Procedure: MICRODISCECTOMY LUMBAR DECOMPRESSION L5-S1 LEFT ;  Surgeon: Johnn Hai, MD;  Location: [facility];  Service: Orthopedics;  Laterality: Left;  . ORIF WRIST FRACTURE Right    retained hardware  . ORTHOPAEDIC SURGERY  04/14/2018  . TUBAL LIGATION  12/25/1995    MEDICATIONS: . ALPRAZolam (XANAX) 0.5 MG tablet  . atorvastatin (LIPITOR) 40 MG tablet  . cyanocobalamin (,VITAMIN B-12,) 1000 MCG/ML injection  . FLUoxetine (PROZAC) 40 MG capsule  . folic acid (FOLVITE) 1 MG tablet  . gabapentin (NEURONTIN) 100 MG capsule  . insulin aspart (NOVOLOG FLEXPEN) 100 UNIT/ML FlexPen  . insulin degludec (TRESIBA FLEXTOUCH) 200 UNIT/ML FlexTouch Pen  . linaclotide (LINZESS) 290 MCG CAPS capsule  . nitroGLYCERIN (NITROSTAT) 0.4 MG SL tablet  . TRUE METRIX BLOOD GLUCOSE TEST test strip   No current facility-administered medications for this encounter.    If no changes, I anticipate pt can proceed with surgery as scheduled.   Willeen Cass, PhD, FNP-BC [facility]/Anesthesiology Phone: [phone] 01/10/2020 9:02 AM

## 2020-01-10 NOTE — Anesthesia Preprocedure Evaluation (Addendum)
Anesthesia Evaluation  Patient identified by MRN, date of birth, ID band Patient awake    Reviewed: Allergy & Precautions, NPO status , Patient's Chart, lab work & pertinent test results  History of Anesthesia Complications Negative for: history of anesthetic complications  Airway Mallampati: III  TM Distance: >3 FB Neck ROM: Full    Dental  (+) Dental Advisory Given, Teeth Intact, Caps   Pulmonary COPD, Current Smoker and Patient abstained from smoking.,  01/09/2020 SARS coronavirus NEG   breath sounds clear to auscultation       Cardiovascular (-) angina+ CAD ('19 cath: Mild nonobstructive CAD at the ostium of the LAD- 30%, otherwise normal coronaries, EF 55-60%)   Rhythm:Regular Rate:Normal     Neuro/Psych Anxiety Back pain    GI/Hepatic negative GI ROS, Neg liver ROS,   Endo/Other  diabetes (glu 117), Insulin Dependent  Renal/GU negative Renal ROS     Musculoskeletal  (+) Arthritis ,   Abdominal   Peds  Hematology   Anesthesia Other Findings   Reproductive/Obstetrics                           Anesthesia Physical Anesthesia Plan  ASA: III  Anesthesia Plan: General   Post-op Pain Management: GA combined w/ Regional for post-op pain   Induction: Intravenous  PONV Risk Score and Plan: 2 and Ondansetron, Dexamethasone and Scopolamine patch - Pre-op  Airway Management Planned: Oral ETT  Additional Equipment: Arterial line  Intra-op Plan:   Post-operative Plan: Extubation in OR  Informed Consent: I have reviewed the patients History and Physical, chart, labs and discussed the procedure including the risks, benefits and alternatives for the proposed anesthesia with the patient or authorized representative who has indicated his/her understanding and acceptance.     Dental advisory given  Plan Discussed with: CRNA and Surgeon  Anesthesia Plan Comments: (See APP note by Durel Salts, FNP Plan routine monitors, GETA with A-line for sampling and TAP block for ost op analgesia)      Anesthesia Quick Evaluation

## 2020-01-11 ENCOUNTER — Inpatient Hospital Stay (HOSPITAL_COMMUNITY): Payer: BC Managed Care – PPO | Admitting: Emergency Medicine

## 2020-01-11 ENCOUNTER — Encounter (HOSPITAL_COMMUNITY): Payer: Self-pay | Admitting: Orthopedic Surgery

## 2020-01-11 ENCOUNTER — Encounter (HOSPITAL_COMMUNITY)
Admission: RE | Disposition: A | Discharge status: Home Health | Payer: Self-pay | Source: Home / Self Care | Attending: Orthopedic Surgery

## 2020-01-11 ENCOUNTER — Inpatient Hospital Stay (HOSPITAL_COMMUNITY)
Admission: RE | Admit: 2020-01-11 | Discharge: 2020-01-23 | DRG: 459 | Disposition: A | Discharge status: Home Health | Payer: BC Managed Care – PPO | Attending: Internal Medicine | Admitting: Internal Medicine

## 2020-01-11 ENCOUNTER — Other Ambulatory Visit: Payer: Self-pay

## 2020-01-11 ENCOUNTER — Inpatient Hospital Stay (HOSPITAL_COMMUNITY): Payer: BC Managed Care – PPO | Admitting: Anesthesiology

## 2020-01-11 ENCOUNTER — Inpatient Hospital Stay (HOSPITAL_COMMUNITY): Payer: BC Managed Care – PPO

## 2020-01-11 DIAGNOSIS — I25118 Atherosclerotic heart disease of native coronary artery with other forms of angina pectoris: Secondary | ICD-10-CM | POA: Diagnosis not present

## 2020-01-11 DIAGNOSIS — Z6825 Body mass index (BMI) 25.0-25.9, adult: Secondary | ICD-10-CM | POA: Diagnosis not present

## 2020-01-11 DIAGNOSIS — F17210 Nicotine dependence, cigarettes, uncomplicated: Secondary | ICD-10-CM | POA: Diagnosis present

## 2020-01-11 DIAGNOSIS — Z888 Allergy status to other drugs, medicaments and biological substances status: Secondary | ICD-10-CM | POA: Diagnosis not present

## 2020-01-11 DIAGNOSIS — R509 Fever, unspecified: Secondary | ICD-10-CM | POA: Diagnosis not present

## 2020-01-11 DIAGNOSIS — M4326 Fusion of spine, lumbar region: Secondary | ICD-10-CM | POA: Diagnosis present

## 2020-01-11 DIAGNOSIS — Z9861 Coronary angioplasty status: Secondary | ICD-10-CM

## 2020-01-11 DIAGNOSIS — Z20822 Contact with and (suspected) exposure to covid-19: Secondary | ICD-10-CM | POA: Diagnosis present

## 2020-01-11 DIAGNOSIS — E782 Mixed hyperlipidemia: Secondary | ICD-10-CM | POA: Diagnosis present

## 2020-01-11 DIAGNOSIS — J440 Chronic obstructive pulmonary disease with acute lower respiratory infection: Secondary | ICD-10-CM | POA: Diagnosis not present

## 2020-01-11 DIAGNOSIS — M419 Scoliosis, unspecified: Secondary | ICD-10-CM | POA: Diagnosis present

## 2020-01-11 DIAGNOSIS — E1065 Type 1 diabetes mellitus with hyperglycemia: Secondary | ICD-10-CM | POA: Diagnosis present

## 2020-01-11 DIAGNOSIS — Z8616 Personal history of COVID-19: Secondary | ICD-10-CM

## 2020-01-11 DIAGNOSIS — Z794 Long term (current) use of insulin: Secondary | ICD-10-CM

## 2020-01-11 DIAGNOSIS — E109 Type 1 diabetes mellitus without complications: Secondary | ICD-10-CM | POA: Diagnosis not present

## 2020-01-11 DIAGNOSIS — Y95 Nosocomial condition: Secondary | ICD-10-CM | POA: Diagnosis not present

## 2020-01-11 DIAGNOSIS — J449 Chronic obstructive pulmonary disease, unspecified: Secondary | ICD-10-CM | POA: Diagnosis present

## 2020-01-11 DIAGNOSIS — Z833 Family history of diabetes mellitus: Secondary | ICD-10-CM

## 2020-01-11 DIAGNOSIS — E1051 Type 1 diabetes mellitus with diabetic peripheral angiopathy without gangrene: Secondary | ICD-10-CM | POA: Diagnosis present

## 2020-01-11 DIAGNOSIS — Z79899 Other long term (current) drug therapy: Secondary | ICD-10-CM

## 2020-01-11 DIAGNOSIS — I959 Hypotension, unspecified: Secondary | ICD-10-CM

## 2020-01-11 DIAGNOSIS — R0602 Shortness of breath: Secondary | ICD-10-CM

## 2020-01-11 DIAGNOSIS — D751 Secondary polycythemia: Secondary | ICD-10-CM | POA: Diagnosis present

## 2020-01-11 DIAGNOSIS — J9601 Acute respiratory failure with hypoxia: Secondary | ICD-10-CM | POA: Diagnosis not present

## 2020-01-11 DIAGNOSIS — F419 Anxiety disorder, unspecified: Secondary | ICD-10-CM | POA: Diagnosis present

## 2020-01-11 DIAGNOSIS — E538 Deficiency of other specified B group vitamins: Secondary | ICD-10-CM | POA: Diagnosis present

## 2020-01-11 DIAGNOSIS — Z809 Family history of malignant neoplasm, unspecified: Secondary | ICD-10-CM | POA: Diagnosis not present

## 2020-01-11 DIAGNOSIS — K5900 Constipation, unspecified: Secondary | ICD-10-CM | POA: Diagnosis not present

## 2020-01-11 DIAGNOSIS — M5116 Intervertebral disc disorders with radiculopathy, lumbar region: Secondary | ICD-10-CM | POA: Diagnosis present

## 2020-01-11 DIAGNOSIS — J189 Pneumonia, unspecified organism: Secondary | ICD-10-CM | POA: Diagnosis not present

## 2020-01-11 DIAGNOSIS — J9690 Respiratory failure, unspecified, unspecified whether with hypoxia or hypercapnia: Secondary | ICD-10-CM

## 2020-01-11 DIAGNOSIS — G8929 Other chronic pain: Secondary | ICD-10-CM | POA: Diagnosis present

## 2020-01-11 DIAGNOSIS — I2510 Atherosclerotic heart disease of native coronary artery without angina pectoris: Secondary | ICD-10-CM | POA: Diagnosis present

## 2020-01-11 DIAGNOSIS — Z8249 Family history of ischemic heart disease and other diseases of the circulatory system: Secondary | ICD-10-CM | POA: Diagnosis not present

## 2020-01-11 DIAGNOSIS — A419 Sepsis, unspecified organism: Secondary | ICD-10-CM | POA: Diagnosis not present

## 2020-01-11 DIAGNOSIS — M5136 Other intervertebral disc degeneration, lumbar region: Secondary | ICD-10-CM | POA: Diagnosis present

## 2020-01-11 DIAGNOSIS — Z9049 Acquired absence of other specified parts of digestive tract: Secondary | ICD-10-CM

## 2020-01-11 DIAGNOSIS — R6521 Severe sepsis with septic shock: Secondary | ICD-10-CM | POA: Diagnosis not present

## 2020-01-11 DIAGNOSIS — Z954 Presence of other heart-valve replacement: Secondary | ICD-10-CM | POA: Diagnosis not present

## 2020-01-11 DIAGNOSIS — M5417 Radiculopathy, lumbosacral region: Secondary | ICD-10-CM

## 2020-01-11 DIAGNOSIS — E663 Overweight: Secondary | ICD-10-CM | POA: Diagnosis present

## 2020-01-11 DIAGNOSIS — I9589 Other hypotension: Secondary | ICD-10-CM | POA: Diagnosis not present

## 2020-01-11 DIAGNOSIS — Z419 Encounter for procedure for purposes other than remedying health state, unspecified: Secondary | ICD-10-CM

## 2020-01-11 HISTORY — PX: ANTERIOR LUMBAR FUSION: SHX1170

## 2020-01-11 HISTORY — PX: ABDOMINAL EXPOSURE: SHX5708

## 2020-01-11 HISTORY — PX: LUMBAR LAMINECTOMY/DECOMPRESSION MICRODISCECTOMY: SHX5026

## 2020-01-11 HISTORY — DX: Fusion of spine, lumbar region: M43.26

## 2020-01-11 LAB — GLUCOSE, CAPILLARY
Glucose-Capillary: 117 mg/dL — ABNORMAL HIGH (ref 70–99)
Glucose-Capillary: 123 mg/dL — ABNORMAL HIGH (ref 70–99)
Glucose-Capillary: 90 mg/dL (ref 70–99)
Glucose-Capillary: 84 mg/dL (ref 70–99)
Glucose-Capillary: 118 mg/dL — ABNORMAL HIGH (ref 70–99)
Glucose-Capillary: 167 mg/dL — ABNORMAL HIGH (ref 70–99)

## 2020-01-11 LAB — ABO/RH: ABO/RH(D): A POS

## 2020-01-11 SURGERY — ANTERIOR LUMBAR FUSION 1 LEVEL
Anesthesia: General

## 2020-01-11 MED ORDER — METHOCARBAMOL 1000 MG/10ML IJ SOLN
500.0000 mg | Freq: Four times a day (QID) | INTRAVENOUS | Status: DC | PRN
  Filled 2020-01-11: qty 5

## 2020-01-11 MED ORDER — DEXAMETHASONE SODIUM PHOSPHATE 10 MG/ML IJ SOLN
INTRAMUSCULAR | Status: DC | PRN
  Administered 2020-01-11: 4 mg via INTRAVENOUS

## 2020-01-11 MED ORDER — OXYCODONE HCL 5 MG PO TABS
5.0000 mg | ORAL_TABLET | ORAL | Status: DC | PRN
  Administered 2020-01-16 – 2020-01-23 (×4): 5 mg via ORAL
  Filled 2020-01-11 (×3): qty 1

## 2020-01-11 MED ORDER — BUPIVACAINE-EPINEPHRINE (PF) 0.25% -1:200000 IJ SOLN
INTRAMUSCULAR | Status: DC | PRN
  Administered 2020-01-11: 15 mL

## 2020-01-11 MED ORDER — INSULIN ASPART 100 UNIT/ML FLEXPEN: 3.0000 [IU] | PEN_INJECTOR | Freq: Three times a day (TID) | SUBCUTANEOUS | Status: DC | PRN

## 2020-01-11 MED ORDER — LINACLOTIDE 145 MCG PO CAPS
290.0000 ug | ORAL_CAPSULE | Freq: Every day | ORAL | Status: DC
  Administered 2020-01-11 – 2020-01-22 (×12): 290 ug via ORAL
  Filled 2020-01-11 (×13): qty 2

## 2020-01-11 MED ORDER — METHOCARBAMOL 500 MG PO TABS
500.0000 mg | ORAL_TABLET | Freq: Four times a day (QID) | ORAL | Status: DC | PRN
  Administered 2020-01-11 – 2020-01-23 (×22): 500 mg via ORAL
  Filled 2020-01-11 (×22): qty 1

## 2020-01-11 MED ORDER — HYDROMORPHONE HCL 1 MG/ML IJ SOLN
INTRAMUSCULAR | Status: AC
  Filled 2020-01-11: qty 1

## 2020-01-11 MED ORDER — BUPIVACAINE LIPOSOME 1.3 % IJ SUSP
INTRAMUSCULAR | Status: DC | PRN
  Administered 2020-01-11: 10 mL

## 2020-01-11 MED ORDER — GABAPENTIN 300 MG PO CAPS
300.0000 mg | ORAL_CAPSULE | Freq: Every day | ORAL | Status: DC
  Administered 2020-01-11 – 2020-01-22 (×12): 300 mg via ORAL
  Filled 2020-01-11 (×6): qty 1
  Filled 2020-01-11 (×2): qty 3
  Filled 2020-01-11 (×3): qty 1
  Filled 2020-01-11: qty 3

## 2020-01-11 MED ORDER — SCOPOLAMINE 1 MG/3DAYS TD PT72
1.0000 | MEDICATED_PATCH | TRANSDERMAL | Status: DC
  Administered 2020-01-11: 1.5 mg via TRANSDERMAL
  Filled 2020-01-11: qty 1

## 2020-01-11 MED ORDER — ONDANSETRON HCL 4 MG/2ML IJ SOLN
INTRAMUSCULAR | Status: AC
  Filled 2020-01-11: qty 2

## 2020-01-11 MED ORDER — METHYLPREDNISOLONE ACETATE 40 MG/ML IJ SUSP
INTRAMUSCULAR | Status: AC
  Filled 2020-01-11: qty 1

## 2020-01-11 MED ORDER — PHENYLEPHRINE 40 MCG/ML (10ML) SYRINGE FOR IV PUSH (FOR BLOOD PRESSURE SUPPORT)
PREFILLED_SYRINGE | INTRAVENOUS | Status: DC | PRN
  Administered 2020-01-11 (×3): 80 ug via INTRAVENOUS

## 2020-01-11 MED ORDER — ENOXAPARIN SODIUM 40 MG/0.4ML ~~LOC~~ SOLN
40.0000 mg | SUBCUTANEOUS | Status: DC
  Administered 2020-01-12 – 2020-01-22 (×11): 40 mg via SUBCUTANEOUS
  Filled 2020-01-11 (×11): qty 0.4

## 2020-01-11 MED ORDER — GABAPENTIN 100 MG PO CAPS
100.0000 mg | ORAL_CAPSULE | Freq: Every day | ORAL | Status: DC
  Administered 2020-01-12 – 2020-01-23 (×12): 100 mg via ORAL
  Filled 2020-01-11 (×12): qty 1

## 2020-01-11 MED ORDER — CEFAZOLIN SODIUM-DEXTROSE 2-3 GM-%(50ML) IV SOLR
INTRAVENOUS | Status: DC | PRN
  Administered 2020-01-11: 2 g via INTRAVENOUS

## 2020-01-11 MED ORDER — BUPIVACAINE-EPINEPHRINE 0.25% -1:200000 IJ SOLN
INTRAMUSCULAR | Status: DC | PRN
  Administered 2020-01-11: 20 mL

## 2020-01-11 MED ORDER — POLYETHYLENE GLYCOL 3350 17 G PO PACK
17.0000 g | PACK | Freq: Every day | ORAL | Status: DC | PRN
  Administered 2020-01-12 – 2020-01-19 (×2): 17 g via ORAL
  Filled 2020-01-11 (×2): qty 1

## 2020-01-11 MED ORDER — CEFAZOLIN SODIUM-DEXTROSE 2-4 GM/100ML-% IV SOLN
INTRAVENOUS | Status: AC
  Filled 2020-01-11: qty 100

## 2020-01-11 MED ORDER — DEXAMETHASONE SODIUM PHOSPHATE 10 MG/ML IJ SOLN
INTRAMUSCULAR | Status: AC
  Filled 2020-01-11: qty 1

## 2020-01-11 MED ORDER — SUGAMMADEX SODIUM 200 MG/2ML IV SOLN
INTRAVENOUS | Status: DC | PRN
  Administered 2020-01-11: 200 mg via INTRAVENOUS

## 2020-01-11 MED ORDER — MIDAZOLAM HCL 2 MG/2ML IJ SOLN
INTRAMUSCULAR | Status: AC
  Administered 2020-01-11: 2 mg via INTRAVENOUS
  Filled 2020-01-11: qty 2

## 2020-01-11 MED ORDER — FENTANYL CITRATE (PF) 250 MCG/5ML IJ SOLN
INTRAMUSCULAR | Status: AC
  Filled 2020-01-11: qty 5

## 2020-01-11 MED ORDER — MIDAZOLAM HCL 2 MG/2ML IJ SOLN: 0.5000 mg | Freq: Once | INTRAMUSCULAR | Status: DC | PRN

## 2020-01-11 MED ORDER — THROMBIN 5000 UNITS EX SOLR
CUTANEOUS | Status: DC | PRN
  Administered 2020-01-11: 5000 [IU] via TOPICAL

## 2020-01-11 MED ORDER — SODIUM CHLORIDE 0.9% FLUSH
3.0000 mL | Freq: Two times a day (BID) | INTRAVENOUS | Status: DC
  Administered 2020-01-13 – 2020-01-22 (×16): 3 mL via INTRAVENOUS

## 2020-01-11 MED ORDER — ROCURONIUM BROMIDE 10 MG/ML (PF) SYRINGE
PREFILLED_SYRINGE | INTRAVENOUS | Status: DC | PRN
  Administered 2020-01-11: 20 mg via INTRAVENOUS
  Administered 2020-01-11: 60 mg via INTRAVENOUS
  Administered 2020-01-11: 40 mg via INTRAVENOUS
  Administered 2020-01-11: 50 mg via INTRAVENOUS

## 2020-01-11 MED ORDER — INSULIN ASPART 100 UNIT/ML ~~LOC~~ SOLN
0.0000 [IU] | Freq: Three times a day (TID) | SUBCUTANEOUS | Status: DC
  Administered 2020-01-12: 3 [IU] via SUBCUTANEOUS
  Administered 2020-01-15: 2 [IU] via SUBCUTANEOUS
  Administered 2020-01-15 – 2020-01-16 (×3): 5 [IU] via SUBCUTANEOUS
  Administered 2020-01-16 – 2020-01-17 (×2): 3 [IU] via SUBCUTANEOUS
  Administered 2020-01-17: 5 [IU] via SUBCUTANEOUS
  Administered 2020-01-17: 11 [IU] via SUBCUTANEOUS
  Administered 2020-01-18: 5 [IU] via SUBCUTANEOUS
  Administered 2020-01-18 (×2): 3 [IU] via SUBCUTANEOUS
  Administered 2020-01-19: 15 [IU] via SUBCUTANEOUS
  Administered 2020-01-19: 5 [IU] via SUBCUTANEOUS

## 2020-01-11 MED ORDER — OXYCODONE HCL 5 MG/5ML PO SOLN: 5.0000 mg | Freq: Once | ORAL | Status: DC | PRN

## 2020-01-11 MED ORDER — LACTATED RINGERS IV SOLN: INTRAVENOUS | Status: DC | PRN

## 2020-01-11 MED ORDER — MENTHOL 3 MG MT LOZG
1.0000 | LOZENGE | OROMUCOSAL | Status: DC | PRN
  Filled 2020-01-11: qty 9

## 2020-01-11 MED ORDER — FENTANYL CITRATE (PF) 100 MCG/2ML IJ SOLN: 100.0000 ug | Freq: Once | INTRAMUSCULAR | Status: AC

## 2020-01-11 MED ORDER — MEPERIDINE HCL 25 MG/ML IJ SOLN: 6.2500 mg | INTRAMUSCULAR | Status: DC | PRN

## 2020-01-11 MED ORDER — DOCUSATE SODIUM 100 MG PO CAPS
100.0000 mg | ORAL_CAPSULE | Freq: Two times a day (BID) | ORAL | Status: DC
  Administered 2020-01-11 – 2020-01-23 (×22): 100 mg via ORAL
  Filled 2020-01-11 (×22): qty 1

## 2020-01-11 MED ORDER — THROMBIN 5000 UNITS EX SOLR
CUTANEOUS | Status: AC
  Filled 2020-01-11: qty 10000

## 2020-01-11 MED ORDER — OXYCODONE HCL 5 MG PO TABS: 5.0000 mg | ORAL_TABLET | Freq: Once | ORAL | Status: DC | PRN

## 2020-01-11 MED ORDER — PROPOFOL 10 MG/ML IV BOLUS
INTRAVENOUS | Status: AC
  Filled 2020-01-11: qty 20

## 2020-01-11 MED ORDER — LIDOCAINE 2% (20 MG/ML) 5 ML SYRINGE
INTRAMUSCULAR | Status: DC | PRN
  Administered 2020-01-11: 20 mg via INTRAVENOUS

## 2020-01-11 MED ORDER — MAGNESIUM CITRATE PO SOLN
1.0000 | Freq: Once | ORAL | Status: AC | PRN
  Administered 2020-01-11: 1 via ORAL
  Filled 2020-01-11: qty 296

## 2020-01-11 MED ORDER — CEFAZOLIN SODIUM-DEXTROSE 1-4 GM/50ML-% IV SOLN
1.0000 g | Freq: Three times a day (TID) | INTRAVENOUS | Status: AC
  Administered 2020-01-11 – 2020-01-12 (×2): 1 g via INTRAVENOUS
  Filled 2020-01-11 (×2): qty 50

## 2020-01-11 MED ORDER — MORPHINE SULFATE (PF) 2 MG/ML IV SOLN
2.0000 mg | INTRAVENOUS | Status: AC | PRN
  Administered 2020-01-11: 2 mg via INTRAVENOUS
  Filled 2020-01-11: qty 1

## 2020-01-11 MED ORDER — INSULIN GLARGINE 100 UNIT/ML ~~LOC~~ SOLN
34.0000 [IU] | Freq: Every day | SUBCUTANEOUS | Status: DC
  Filled 2020-01-11 (×4): qty 0.34

## 2020-01-11 MED ORDER — METHYLPREDNISOLONE ACETATE 80 MG/ML IJ SUSP
INTRAMUSCULAR | Status: AC
  Filled 2020-01-11: qty 1

## 2020-01-11 MED ORDER — HYDROMORPHONE HCL 1 MG/ML IJ SOLN
0.2500 mg | INTRAMUSCULAR | Status: DC | PRN
  Administered 2020-01-11 (×2): 0.5 mg via INTRAVENOUS

## 2020-01-11 MED ORDER — FENTANYL CITRATE (PF) 100 MCG/2ML IJ SOLN
INTRAMUSCULAR | Status: AC
  Administered 2020-01-11: 100 ug via INTRAVENOUS
  Filled 2020-01-11: qty 2

## 2020-01-11 MED ORDER — MIDAZOLAM HCL 2 MG/2ML IJ SOLN: 2.0000 mg | Freq: Once | INTRAMUSCULAR | Status: AC

## 2020-01-11 MED ORDER — PHENYLEPHRINE HCL-NACL 10-0.9 MG/250ML-% IV SOLN
INTRAVENOUS | Status: DC | PRN
  Administered 2020-01-11: 30 ug/min via INTRAVENOUS

## 2020-01-11 MED ORDER — INSULIN REGULAR(HUMAN) IN NACL 100-0.9 UT/100ML-% IV SOLN
INTRAVENOUS | Status: DC | PRN
  Administered 2020-01-11: 1.4 [IU]/h via INTRAVENOUS

## 2020-01-11 MED ORDER — PHENOL 1.4 % MT LIQD: 1.0000 | OROMUCOSAL | Status: DC | PRN

## 2020-01-11 MED ORDER — FENTANYL CITRATE (PF) 250 MCG/5ML IJ SOLN
INTRAMUSCULAR | Status: DC | PRN
Start: 2020-01-11 — End: 2020-01-11
  Administered 2020-01-11: 25 ug via INTRAVENOUS
  Administered 2020-01-11 (×2): 50 ug via INTRAVENOUS
  Administered 2020-01-11: 25 ug via INTRAVENOUS

## 2020-01-11 MED ORDER — PROPOFOL 10 MG/ML IV BOLUS
INTRAVENOUS | Status: DC | PRN
  Administered 2020-01-11: 150 mg via INTRAVENOUS
  Administered 2020-01-11: 50 mg via INTRAVENOUS

## 2020-01-11 MED ORDER — PROMETHAZINE HCL 25 MG/ML IJ SOLN: 6.2500 mg | INTRAMUSCULAR | Status: DC | PRN

## 2020-01-11 MED ORDER — ROCURONIUM BROMIDE 10 MG/ML (PF) SYRINGE
PREFILLED_SYRINGE | INTRAVENOUS | Status: AC
  Filled 2020-01-11: qty 10

## 2020-01-11 MED ORDER — METHYLPREDNISOLONE ACETATE 40 MG/ML IJ SUSP
INTRAMUSCULAR | Status: DC | PRN
  Administered 2020-01-11: 20 mg

## 2020-01-11 MED ORDER — ALPRAZOLAM 0.5 MG PO TABS
0.5000 mg | ORAL_TABLET | Freq: Every day | ORAL | Status: DC
  Administered 2020-01-11 – 2020-01-22 (×12): 0.5 mg via ORAL
  Filled 2020-01-11 (×12): qty 1

## 2020-01-11 MED ORDER — HEMOSTATIC AGENTS (NO CHARGE) OPTIME
TOPICAL | Status: DC | PRN
  Administered 2020-01-11 (×2): 1 via TOPICAL

## 2020-01-11 MED ORDER — ACETAMINOPHEN 500 MG PO TABS
1000.0000 mg | ORAL_TABLET | Freq: Once | ORAL | Status: AC
  Administered 2020-01-11: 1000 mg via ORAL
  Filled 2020-01-11: qty 2

## 2020-01-11 MED ORDER — HEMOSTATIC AGENTS (NO CHARGE) OPTIME
TOPICAL | Status: DC | PRN
  Administered 2020-01-11: 1 via TOPICAL

## 2020-01-11 MED ORDER — GLYCOPYRROLATE PF 0.2 MG/ML IJ SOSY
PREFILLED_SYRINGE | INTRAMUSCULAR | Status: DC | PRN
  Administered 2020-01-11: .2 mg via INTRAVENOUS

## 2020-01-11 MED ORDER — SODIUM CHLORIDE 0.9% FLUSH: 3.0000 mL | INTRAVENOUS | Status: DC | PRN

## 2020-01-11 MED ORDER — LIDOCAINE 2% (20 MG/ML) 5 ML SYRINGE
INTRAMUSCULAR | Status: AC
  Filled 2020-01-11: qty 5

## 2020-01-11 MED ORDER — ONDANSETRON HCL 4 MG PO TABS: 4.0000 mg | ORAL_TABLET | Freq: Four times a day (QID) | ORAL | Status: DC | PRN

## 2020-01-11 MED ORDER — MIDAZOLAM HCL 2 MG/2ML IJ SOLN
INTRAMUSCULAR | Status: AC
  Filled 2020-01-11: qty 2

## 2020-01-11 MED ORDER — ACETAMINOPHEN 325 MG PO TABS
650.0000 mg | ORAL_TABLET | ORAL | Status: DC | PRN
  Administered 2020-01-12 – 2020-01-16 (×5): 650 mg via ORAL
  Filled 2020-01-11 (×6): qty 2

## 2020-01-11 MED ORDER — LACTATED RINGERS IV SOLN: INTRAVENOUS | Status: DC

## 2020-01-11 MED ORDER — ONDANSETRON HCL 4 MG/2ML IJ SOLN
INTRAMUSCULAR | Status: DC | PRN
  Administered 2020-01-11: 4 mg via INTRAVENOUS

## 2020-01-11 MED ORDER — ONDANSETRON HCL 4 MG/2ML IJ SOLN
4.0000 mg | Freq: Four times a day (QID) | INTRAMUSCULAR | Status: DC | PRN
  Administered 2020-01-13: 4 mg via INTRAVENOUS
  Filled 2020-01-11: qty 2

## 2020-01-11 MED ORDER — 0.9 % SODIUM CHLORIDE (POUR BTL) OPTIME
TOPICAL | Status: DC | PRN
  Administered 2020-01-11: 1000 mL

## 2020-01-11 MED ORDER — INSULIN ASPART 100 UNIT/ML ~~LOC~~ SOLN
0.0000 [IU] | Freq: Every day | SUBCUTANEOUS | Status: DC
  Administered 2020-01-15: 3 [IU] via SUBCUTANEOUS
  Administered 2020-01-16 – 2020-01-17 (×2): 4 [IU] via SUBCUTANEOUS
  Administered 2020-01-18 – 2020-01-19 (×2): 2 [IU] via SUBCUTANEOUS

## 2020-01-11 MED ORDER — FLUOXETINE HCL 20 MG PO CAPS
80.0000 mg | ORAL_CAPSULE | Freq: Every day | ORAL | Status: DC
  Administered 2020-01-11 – 2020-01-23 (×13): 80 mg via ORAL
  Filled 2020-01-11 (×13): qty 4

## 2020-01-11 MED ORDER — OXYCODONE HCL 5 MG PO TABS
10.0000 mg | ORAL_TABLET | ORAL | Status: DC | PRN
  Administered 2020-01-11 – 2020-01-23 (×60): 10 mg via ORAL
  Filled 2020-01-11 (×61): qty 2

## 2020-01-11 MED ORDER — CHLORHEXIDINE GLUCONATE 0.12 % MT SOLN
OROMUCOSAL | Status: AC
  Administered 2020-01-11: 15 mL
  Filled 2020-01-11: qty 15

## 2020-01-11 MED ORDER — NITROGLYCERIN 0.4 MG SL SUBL: 0.4000 mg | SUBLINGUAL_TABLET | SUBLINGUAL | Status: DC | PRN

## 2020-01-11 MED ORDER — ACETAMINOPHEN 650 MG RE SUPP: 650.0000 mg | RECTAL | Status: DC | PRN

## 2020-01-11 SURGICAL SUPPLY — 92 items
AGENT HMST KT MTR STRL THRMB (HEMOSTASIS) ×2
APL SKNCLS STERI-STRIP NONHPOA (GAUZE/BANDAGES/DRESSINGS) ×1
APPLIER CLIP 11 MED OPEN (CLIP) ×3
APR CLP MED 11 20 MLT OPN (CLIP) ×1
BENZOIN TINCTURE PRP APPL 2/3 (GAUZE/BANDAGES/DRESSINGS) ×2 IMPLANT
BLADE CLIPPER SURG (BLADE) IMPLANT
BLADE SURG 10 STRL SS (BLADE) ×3 IMPLANT
BONE VIVIGEN FORMABLE 10CC (Bone Implant) ×3 IMPLANT
BUR EGG ELITE 4.0 (BURR) ×1 IMPLANT
BUR EGG ELITE 4.0MM (BURR)
CABLE BIPOLOR RESECTION CORD (MISCELLANEOUS) ×1 IMPLANT
CANISTER SUCT 3000ML PPV (MISCELLANEOUS) ×3 IMPLANT
CATH FOLEY 2WAY SLVR  5CC 16FR (CATHETERS) ×3
CATH FOLEY 2WAY SLVR 5CC 16FR (CATHETERS) ×1 IMPLANT
CLIP APPLIE 11 MED OPEN (CLIP) ×1 IMPLANT
CLOSURE STERI-STRIP 1/2X4 (GAUZE/BANDAGES/DRESSINGS)
CLOSURE WOUND 1/2 X4 (GAUZE/BANDAGES/DRESSINGS) ×2
CLSR STERI-STRIP ANTIMIC 1/2X4 (GAUZE/BANDAGES/DRESSINGS) IMPLANT
COVER SURGICAL LIGHT HANDLE (MISCELLANEOUS) ×1 IMPLANT
COVER WAND RF STERILE (DRAPES) ×3 IMPLANT
DRAPE C-ARM 42X72 X-RAY (DRAPES) ×6 IMPLANT
DRAPE C-ARMOR (DRAPES) ×3 IMPLANT
DRAPE POUCH INSTRU U-SHP 10X18 (DRAPES) ×3 IMPLANT
DRAPE SURG 17X23 STRL (DRAPES) ×6 IMPLANT
DRAPE U-SHAPE 47X51 STRL (DRAPES) ×3 IMPLANT
DRAPE UNIVERSAL (DRAPES) ×4 IMPLANT
DRSG OPSITE POSTOP 4X6 (GAUZE/BANDAGES/DRESSINGS) ×4 IMPLANT
DRSG OPSITE POSTOP 4X8 (GAUZE/BANDAGES/DRESSINGS) ×1 IMPLANT
DURAPREP 26ML APPLICATOR (WOUND CARE) ×3 IMPLANT
ELECT BLADE 4.0 EZ CLEAN MEGAD (MISCELLANEOUS) ×6
ELECT CAUTERY BLADE 6.4 (BLADE) ×3 IMPLANT
ELECT PENCIL ROCKER SW 15FT (MISCELLANEOUS) ×1 IMPLANT
ELECT REM PT RETURN 9FT ADLT (ELECTROSURGICAL) ×6
ELECTRODE BLDE 4.0 EZ CLN MEGD (MISCELLANEOUS) ×1 IMPLANT
ELECTRODE REM PT RTRN 9FT ADLT (ELECTROSURGICAL) ×2 IMPLANT
GAUZE 4X4 16PLY RFD (DISPOSABLE) IMPLANT
GLOVE BIO SURGEON STRL SZ 6.5 (GLOVE) ×4 IMPLANT
GLOVE BIO SURGEON STRL SZ7.5 (GLOVE) IMPLANT
GLOVE BIO SURGEONS STRL SZ 6.5 (GLOVE) ×2
GLOVE BIOGEL PI IND STRL 6.5 (GLOVE) ×2 IMPLANT
GLOVE BIOGEL PI IND STRL 8.5 (GLOVE) ×3 IMPLANT
GLOVE BIOGEL PI INDICATOR 6.5 (GLOVE) ×4
GLOVE BIOGEL PI INDICATOR 8.5 (GLOVE) ×6
GLOVE SS BIOGEL STRL SZ 8.5 (GLOVE) ×3 IMPLANT
GLOVE SUPERSENSE BIOGEL SZ 8.5 (GLOVE) ×6
GOWN STRL REUS W/ TWL LRG LVL3 (GOWN DISPOSABLE) ×3 IMPLANT
GOWN STRL REUS W/ TWL XL LVL3 (GOWN DISPOSABLE) IMPLANT
GOWN STRL REUS W/TWL 2XL LVL3 (GOWN DISPOSABLE) ×10 IMPLANT
GOWN STRL REUS W/TWL LRG LVL3 (GOWN DISPOSABLE) ×9
GOWN STRL REUS W/TWL XL LVL3 (GOWN DISPOSABLE) ×12
GRAFT BNE MATRIX VG FRMBL L 10 (Bone Implant) IMPLANT
HEMOSTAT SNOW SURGICEL 2X4 (HEMOSTASIS) IMPLANT
INTERPLATE 39X14X12 (Plate) ×3 IMPLANT
KIT BASIN OR (CUSTOM PROCEDURE TRAY) ×6 IMPLANT
KIT TURNOVER KIT B (KITS) ×6 IMPLANT
NDL SPNL 18GX3.5 QUINCKE PK (NEEDLE) ×3 IMPLANT
NEEDLE SPNL 18GX3.5 QUINCKE PK (NEEDLE) ×9 IMPLANT
NS IRRIG 1000ML POUR BTL (IV SOLUTION) ×3 IMPLANT
PACK LAMINECTOMY NEURO (CUSTOM PROCEDURE TRAY) ×2 IMPLANT
PACK LAMINECTOMY ORTHO (CUSTOM PROCEDURE TRAY) ×1 IMPLANT
PACK UNIVERSAL I (CUSTOM PROCEDURE TRAY) ×6 IMPLANT
PAD ARMBOARD 7.5X6 YLW CONV (MISCELLANEOUS) ×2 IMPLANT
PATTIES SURGICAL .5 X.5 (GAUZE/BANDAGES/DRESSINGS) IMPLANT
PATTIES SURGICAL .5 X1 (DISPOSABLE) IMPLANT
PEEK SPACER INTERPLAT 35X14X12 (Peek) ×2 IMPLANT
PLATE SPINAL INTERPLT 39X14X12 (Plate) IMPLANT
SCREW BONE RESCUE (Screw) ×6 IMPLANT
SCREW SPINAL STD (Orthopedic Implant) ×2 IMPLANT
SPONGE INTESTINAL PEANUT (DISPOSABLE) ×6 IMPLANT
SPONGE LAP 18X18 RF (DISPOSABLE) ×2 IMPLANT
SPONGE SURGIFOAM ABS GEL 100 (HEMOSTASIS) IMPLANT
SPONGE SURGIFOAM ABS GEL SZ50 (HEMOSTASIS) ×2 IMPLANT
STRIP CLOSURE SKIN 1/2X4 (GAUZE/BANDAGES/DRESSINGS) ×2 IMPLANT
SURGIFLO W/THROMBIN 8M KIT (HEMOSTASIS) ×5 IMPLANT
SUT BONE WAX W31G (SUTURE) ×6 IMPLANT
SUT MNCRL AB 3-0 PS2 27 (SUTURE) ×3 IMPLANT
SUT PDS AB 1 CTX 36 (SUTURE) ×5 IMPLANT
SUT PROLENE 5 0 C 1 24 (SUTURE) IMPLANT
SUT SILK 0 TIES 10X30 (SUTURE) IMPLANT
SUT SILK 2 0 TIES 10X30 (SUTURE) ×3 IMPLANT
SUT SILK 3 0 TIES 10X30 (SUTURE) ×1 IMPLANT
SUT VIC AB 1 CT1 18XCR BRD 8 (SUTURE) IMPLANT
SUT VIC AB 1 CT1 27 (SUTURE)
SUT VIC AB 1 CT1 27XBRD ANBCTR (SUTURE) ×2 IMPLANT
SUT VIC AB 1 CT1 8-18 (SUTURE)
SUT VIC AB 2-0 CT1 18 (SUTURE) ×3 IMPLANT
SYR BULB IRRIG 60ML STRL (SYRINGE) ×3 IMPLANT
SYR CONTROL 10ML LL (SYRINGE) ×3 IMPLANT
TOWEL GREEN STERILE (TOWEL DISPOSABLE) ×6 IMPLANT
TOWEL GREEN STERILE FF (TOWEL DISPOSABLE) ×6 IMPLANT
TRAP SPECIMEN MUCUS 40CC (MISCELLANEOUS) ×3 IMPLANT
WATER STERILE IRR 1000ML POUR (IV SOLUTION) ×3 IMPLANT

## 2020-01-11 NOTE — Brief Op Note (Signed)
01/11/2020  4:53 PM  PATIENT:  [name]  59 y.o. female  PRE-OPERATIVE DIAGNOSIS:  Degenerative disc disease post laminectomy syndrome Lumbar five-Sacral one, Lumbar two-three foraminal herniated disc  POST-OPERATIVE DIAGNOSIS:  Degenerative disc disease post laminectomy syndrome Lumbar five-Sacral one, Lumbar two-three foraminal herniated disc  PROCEDURE:  Procedure(s): ANTERIOR LUMBAR INTERBODY FUSION  LUMBAR FIVE-SACRAL ONE, LEFT LUMBAR TWO-THREE FORAMINOTOMY, DISECTOMY (N/A) LUMBAR LAMINECTOMY/DECOMPRESSION MICRODISCECTOMY LEFT LUMBAR TWO-THREE (N/A) ABDOMINAL EXPOSURE (N/A)  SURGEON:  Surgeon(s) and Role: Panel 1:    Melina Schools, MD - Primary Panel 2:    Marty Heck, MD - Primary  PHYSICIAN ASSISTANT:   ASSISTANTS: Amanda Ward, PA   ANESTHESIA:   general  EBL:  100 mL   BLOOD ADMINISTERED:none  DRAINS: none   LOCAL MEDICATIONS USED:  MARCAINE    and OTHER depomedrol  SPECIMEN:  No Specimen  DISPOSITION OF SPECIMEN:  N/A  COUNTS:  YES  TOURNIQUET:  * No tourniquets in log *  DICTATION: .Dragon Dictation  PLAN OF CARE: Admit to inpatient   PATIENT DISPOSITION:  PACU - hemodynamically stable.

## 2020-01-11 NOTE — Anesthesia Procedure Notes (Signed)
Arterial Line Insertion Performed by: Annye Asa, MD, anesthesiologist  Patient location: Pre-op. Preanesthetic checklist: patient identified, IV checked, site marked, risks and benefits discussed, surgical consent, monitors and equipment checked, pre-op evaluation, timeout performed and anesthesia consent Lidocaine 1% used for infiltration Left, radial was placed Catheter size: 20 G Hand hygiene performed  and Seldinger technique used  Attempts: 2 Procedure performed without using ultrasound guided technique. Following insertion, dressing applied. Post procedure assessment: normal and unchanged  Patient tolerated the procedure well with no immediate complications.

## 2020-01-11 NOTE — Op Note (Signed)
Date: January 11, 2020  Preoperative diagnosis: Degenerative disc disease  Postoperative diagnosis: Same  Procedure: Anterior spine exposure at L5-S1 disc space for ALIF  Surgeon: Dr. Marty Heck, MD  Co-surgeon: Dr. Melina Schools, MD  Indications: Patient is a 59 year old female with a long history of degenerative disc disease including lower extremity radiculopathy and failed conservative management.  Dr. Rolena Infante has recommended multilevel surgical treatment including a fusion from anterior approach at the L5-S1 disc space.  She presents today for anterior spine exposure after risks and benefits discussed.  Findings: Transverse incision made over the left rectus muscle and the left rectus muscle was then fully mobilized and the retroperitoneal space was entered laterally and the peritoneum and left ureter were mobilized across midline including mobilization of the left iliac vein to expose the L5-S1 disc space from an anterior approach.  The correct disc space was confirmed with a C arm and a spinal needle in the L5-S1 disc space at completion.  Anesthesia: General  Details: Patient was taken to the operating room after informed consent was obtained.  Placed on operative table in supine position.  General endotracheal anesthesia was induced.  She did get preoperative antibiotics.  Used a fluoroscopic C arm in the lateral position to mark the L5-S1 disc base on the anterior abdominal wall over the left rectus.  Her abdominal wall was then prepped and draped in usual sterile fashion.  Timeout was performed identify patient, procedure and site.  Initially made incision over her left rectus muscle at our previous mark.  Dissected with Bovie cautery to open subcutaneous tissue until I encountered the anterior rectus sheath.  The anterior rectus sheath was opened in transverse fashion with Bovie cautery. A flap was raised under the anterior rectus sheath. The left rectus muscle was then  circumferentially mobilized.  I then entered the retroperitoneal space laterally and using a Kd for blunt dissection the peritoneum was mobilized across the midline including mobilization of the left ureter.  My assistant then used long Wiley retractors to pull the peritoneum and ureter across midline and I used a Balfour with a wet lap pad for added visualization.  Ultimately the left iliac vein was mobilized laterally with blunt dissection and the also mobilized all the peritoneum and additional tissue to the right of the disc space.  The middle sacral vessels divided between clips.  Ultimately we placed fixed NuVasive retractor with fixed retractor both cranial caudal to the disc spase as well as on each side of the disc base.  Ultimately a spinal needle was placed in the L5-S1 disc space and we confirmed we were at the correct disc space on lateral fluoroscop.  The case was then turned over to Dr. Rolena Infante.  Please see his dictation for the remainder the case.  Complication: None  Condition: Stable  Marty Heck, MD [facility] Office: [place]

## 2020-01-11 NOTE — H&P (Signed)
History and Physical Interval Note:  01/11/2020 11:49 AM  [name]  has presented today for surgery, with the diagnosis of Degenerative disc disease post laminectomy syndrome L5-S1, L2-3 foraminal herniated disc.  The various methods of treatment have been discussed with the patient and family. After consideration of risks, benefits and other options for treatment, the patient has consented to  Procedure(s) with comments: ANTERIOR LUMBAR FUSION (ALIF) L5-S1, LEFT L2-3 FORAMINOTOMY, DISECTOMY (N/A) - 4 hrs Dr. Donnetta Hutching to do approach Left tap block with exparel LUMBAR LAMINECTOMY/DECOMPRESSION MICRODISCECTOMY 1 LEVEL (N/A) ABDOMINAL EXPOSURE (N/A) as a surgical intervention.  The patient's history has been reviewed, patient examined, no change in status, stable for surgery.  I have reviewed the patient's chart and labs.  Questions were answered to the patient's satisfaction.    L5-S1 ALIF.  Discussed anterior exposure for Dr. Rolena Infante.  Marty Heck   [facility]  Patient name: [name]           MRN: [id number]        DOB: 08/13/1960          Sex: female  REASON FOR CONSULT: Discuss anterior exposure for L5-S1 fusion with Dr. Rolena Infante  HPI: [name] is a 59 y.o. female, who is here today for discussion of my role for anterior exposure.  She has a long history of progressive lower extremity pain.  She has degenerative disc disease and has had imaging suggesting T level disease.  She has failed conservative therapy and Dr. Rolena Infante is recommended multilevel surgical treatment with fusion from anterior approach and L5-S1 level..  She does have prior history of coronary angioplasty.      Past Medical History:  Diagnosis Date  . Abnormal thyroid function test 12/06/2015  . Acute bilateral thoracic back pain 12/03/2018  . Alcohol use 04/15/2019   Formatting of this note might be different from the original. Recommend abstinence from alcohol  .  Anxiety disorder 06/05/2015   Last Assessment & Plan:  Formatting of this note might be different from the original. Had started her on some xanax about week ago and she feels it has helped her and she is taking about half tablet for this and will continue with it as she has been compliant with it  . Arteriosclerotic vascular disease 11/06/2017  . Arthritis    back   . Atherosclerosis 06/05/2015   Formatting of this note might be different from the original. Seen on CTs of chest.  . Attention deficit 12/04/2017  . Bursitis of both hips 04/14/2018  . CAD (coronary artery disease) 12/24/2017  . Chest pain 11/06/2017  . Chronic bronchitis ([place]) 12/04/2015   Formatting of this note might be different from the original. PFT ratio 88% FEV1 103% FVC 94% DLCO 76%  Last Assessment & Plan:  Formatting of this note might be different from the original. We discussed this as she has been coughing again for about 2 mnths and she sounds congested on exam with previous ease for pneumonia will place her on levaquin and have reviewed with her the importance of her   . Cigarette smoker 02/26/2016  . Coronary arteriosclerosis 12/24/2017  . Coronary artery disease of native artery of native heart with stable angina pectoris ([place]) 01/25/2016   Formatting of this note might be different from the original. 2017:  50% LAD lesion.  Calcium noted other places.  Heart cath December 31, 2017 with a 30% ostial LAD lesion otherwise normal  coronary arteries seen with LVEF 55 to 65%  . Degeneration of lumbar intervertebral disc 11/06/2017  . Diabetes mellitus type 1, uncomplicated ([place]) 78/58/8502   Added automatically from request for surgery [phone]  Formatting of this note might be different from the original. Added automatically from request for surgery [phone]  . Diabetes mellitus without complication (HCC)    Type 1- injections only- recent change to [place] with improved A1C reading.  Marland Kitchen Dysphagia 11/04/2017  . Dyspnea on  exertion 12/04/2017  . Elevated LFTs 03/10/2019   Formatting of this note might be different from the original. History.  Cannot change to this.  . Erythrocytosis 12/04/2017  . Fatigue 11/06/2017  . Folic acid deficiency 06/07/4707  . Foot pain 01/28/2018  . Hemangioma 11/06/2017  . History of fracture 11/06/2017  . History of metabolic disorder 09/27/8364  . HNP (herniated nucleus pulposus), lumbar 05/10/2014  . Insulin long-term use ([place]) 11/06/2017  . Intermittent claudication ([place]) 12/04/2017  . Lumbar post-laminectomy syndrome 03/04/2019  . Lumbar radiculopathy 05/26/2017  . Lung mass 12/04/2017  . Malaise and fatigue 11/06/2017  . Menopausal disorder 11/06/2017  . Mixed hyperlipidemia 04/09/2016   Formatting of this note might be different from the original. Added automatically from request for surgery [phone]  . Moderate chronic obstructive pulmonary disease ([place]) 11/06/2017  . Multiple actinic keratoses 11/06/2017  . Myositis 12/04/2017  . Overweight with body mass index (BMI) 25.0-29.9 07/11/2015   Last Assessment & Plan:  Relevant Hx: Course: Daily Update: Today's Plan:difficult for her with the insulin as well as her abilify and the weight is increaseing for her rather than decreasing, she has cut her alcohol intake back, she has worked on her diet more with decreasing her carb intake as well and still difficulty discussed with the saxenda and will try to get this approved for her and see   . Pain of left hip joint 07/23/2018  . Pneumonia   . Polyarthralgia 06/11/2018  . Primary insomnia 06/05/2015  . Primary osteoarthritis involving multiple joints 08/24/2019  . Purpura ([place]) 11/06/2017  . Respiratory failure ([place]) 11/2018  . Sciatica 11/03/2017  . Scoliosis deformity of spine 11/03/2017  . Secondary diabetes mellitus ([place]) 12/24/2017  . Senile purpura ([place]) 12/04/2017  . Smoking greater than 30 pack years 02/26/2016  . Tobacco abuse 02/11/2016  . Tobacco user 02/11/2016  . Trichotillomania    hx.           Family History  Problem Relation Age of Onset  . Diabetes Mother   . Cancer Mother   . Hypertension Father   . Heart disease Father   . Diabetes Father     SOCIAL HISTORY: Social History        Socioeconomic History  . Marital status: Divorced    Spouse name: Not on file  . Number of children: Not on file  . Years of education: Not on file  . Highest education level: Not on file  Occupational History  . Not on file  Tobacco Use  . Smoking status: Current Some Day Smoker    Packs/day: 0.50    Years: 30.00    Pack years: 15.00    Types: Cigarettes  . Smokeless tobacco: Never Used  Vaping Use  . Vaping Use: Never used  Substance and Sexual Activity  . Alcohol use: Yes    Comment: weekends - social  . Drug use: No  . Sexual activity: Not Currently  Other Topics Concern  . Not on file  Social  History Narrative  . Not on file   Social Determinants of Health      Financial Resource Strain:   . Difficulty of Paying Living Expenses: Not on file  Food Insecurity:   . Worried About Charity fundraiser in the Last Year: Not on file  . Ran Out of Food in the Last Year: Not on file  Transportation Needs:   . Lack of Transportation (Medical): Not on file  . Lack of Transportation (Non-Medical): Not on file  Physical Activity:   . Days of Exercise per Week: Not on file  . Minutes of Exercise per Session: Not on file  Stress:   . Feeling of Stress : Not on file  Social Connections:   . Frequency of Communication with Friends and Family: Not on file  . Frequency of Social Gatherings with Friends and Family: Not on file  . Attends Religious Services: Not on file  . Active Member of Clubs or Organizations: Not on file  . Attends Archivist Meetings: Not on file  . Marital Status: Not on file  Intimate Partner Violence:   . Fear of Current or Ex-Partner: Not on file  . Emotionally Abused: Not on file  . Physically Abused: Not  on file  . Sexually Abused: Not on file         Allergies  Allergen Reactions  . Celebrex [Celecoxib] Swelling    Face swelling  . Mobic [Meloxicam] Swelling    Face swelling          Current Outpatient Medications  Medication Sig Dispense Refill  . ALPRAZolam (XANAX) 0.5 MG tablet Take 0.5 mg by mouth at bedtime.     Marland Kitchen atorvastatin (LIPITOR) 40 MG tablet Take 40 mg by mouth at bedtime.    . cyanocobalamin (,VITAMIN B-12,) 1000 MCG/ML injection Inject 1,000 mcg into the muscle every 14 (fourteen) days.    Marland Kitchen FLUoxetine (PROZAC) 40 MG capsule Take 80 mg by mouth daily.     . folic acid (FOLVITE) 1 MG tablet Take 1 mg by mouth at bedtime.     . gabapentin (NEURONTIN) 100 MG capsule Take 100-300 mg by mouth See admin instructions. Take 100 mg by mouth in the morning and 300 mg at bedtime    . insulin aspart (NOVOLOG FLEXPEN) 100 UNIT/ML FlexPen Inject 3-4 Units into the skin 3 (three) times daily as needed (blood sugar above 200).     . insulin degludec (TRESIBA FLEXTOUCH) 200 UNIT/ML FlexTouch Pen Inject 34 Units into the skin daily.     Marland Kitchen linaclotide (LINZESS) 290 MCG CAPS capsule Take 290 mcg by mouth at bedtime.     . nitroGLYCERIN (NITROSTAT) 0.4 MG SL tablet Place 1 tablet (0.4 mg total) under the tongue every 5 (five) minutes as needed. 25 tablet 11  . TRUE METRIX BLOOD GLUCOSE TEST test strip CHECK BLOOD SUGARS THREE TIMES DAILY     No current facility-administered medications for this visit.    REVIEW OF SYSTEMS:  [X]  denotes positive finding, [ ]  denotes negative finding Cardiac  Comments:  Chest pain or chest pressure:    Shortness of breath upon exertion:    Short of breath when lying flat:    Irregular heart rhythm:        Vascular    Pain in calf, thigh, or hip brought on by ambulation:    Pain in feet at night that wakes you up from your sleep:     Blood clot in your  veins:    Leg swelling:          Pulmonary    Oxygen at home:    Productive cough:     Wheezing:         Neurologic    Sudden weakness in arms or legs:     Sudden numbness in arms or legs:     Sudden onset of difficulty speaking or slurred speech:    Temporary loss of vision in one eye:     Problems with dizziness:         Gastrointestinal    Blood in stool:     Vomited blood:         Genitourinary    Burning when urinating:     Blood in urine:        Psychiatric    Major depression:         Hematologic    Bleeding problems:    Problems with blood clotting too easily:        Skin    Rashes or ulcers:        Constitutional    Fever or chills:      PHYSICAL EXAM:    Vitals:   01/09/20 1341  BP: 101/66  Pulse: 76  Resp: 14  Temp: 98.2 F (36.8 C)  TempSrc: Temporal  SpO2: 98%  Weight: 150 lb (68 kg)  Height: 5\' 2"  (1.575 m)    GENERAL: The patient is a well-nourished female, in no acute distress. The vital signs are documented above. CARDIOVASCULAR: Carotid arteries without bruits bilaterally.  2+ radial and 2+ dorsalis pedis pulses bilaterally PULMONARY: There is good air exchange  ABDOMEN: Soft and non-tender  MUSCULOSKELETAL: There are no major deformities or cyanosis. NEUROLOGIC: No focal weakness or paresthesias are detected. SKIN: There are no ulcers or rashes noted. PSYCHIATRIC: The patient has a normal affect.  DATA:  MRI was reviewed and also plain films.  She does have very scattered slight iliac calcifications.  Normal location of her aortic and caval bifurcation  MEDICAL ISSUES: I discussed my role in anterior exposure.  Discussed mobilization of the rectus muscle, intraperitoneal contents, left ureter and arterial and venous structures overlying the spine.  Discussed potential for injury of all of these in particular discussed venous injury.  Her only prior abdominal surgery was appendectomy.   She is not obese.  She has minimal atherosclerotic change in her imaging preoperatively.  I do not see any contraindication for anterior approach.  Surgery is scheduled for 01/11/2020   Rosetta Posner, MD [facility] Tel [phone] Pager [phone]

## 2020-01-11 NOTE — Anesthesia Procedure Notes (Signed)
Anesthesia Regional Block: TAP block   Pre-Anesthetic Checklist: ,, timeout performed, Correct Patient, Correct Site, Correct Laterality, Correct Procedure, Correct Position, site marked, Risks and benefits discussed,  Surgical consent,  Pre-op evaluation,  At surgeon's request and post-op pain management  Laterality: Left  Prep: chloraprep       Needles:  Injection technique: Single-shot  Needle Type: Echogenic Needle     Needle Length: 9cm  Needle Gauge: 21     Additional Needles:   Narrative:  Start time: 01/11/2020 11:52 AM End time: 01/11/2020 11:59 AM Injection made incrementally with aspirations every 5 mL.  Performed by: Personally  Anesthesiologist: Annye Asa, MD  Additional Notes: Pt identified in Holding room.  Monitors applied. Working IV access confirmed. Sterile prep L flank.  #21ga ECHOgenic Arrow block needle into TAP with US guidance.  15cc 0.25% Bupivacaine with 1:200k epi with Exparel injected incrementally after negative test dose.  Patient asymptomatic, VSS, no heme aspirated, tolerated well.  Jenita Seashore, MD

## 2020-01-11 NOTE — Anesthesia Procedure Notes (Signed)
Procedure Name: Intubation Performed by: Milford Cage, CRNA Pre-anesthesia Checklist: Patient identified, Emergency Drugs available, Suction available and Patient being monitored Patient Re-evaluated:Patient Re-evaluated prior to induction Oxygen Delivery Method: Circle System Utilized Preoxygenation: Pre-oxygenation with 100% oxygen Induction Type: IV induction Ventilation: Mask ventilation without difficulty Laryngoscope Size: Miller and 2 Grade View: Grade II Tube type: Oral Tube size: 7.5 mm Number of attempts: 1 Airway Equipment and Method: Stylet Placement Confirmation: ETT inserted through vocal cords under direct vision,  positive ETCO2 and breath sounds checked- equal and bilateral Secured at: 21 cm Tube secured with: Tape Dental Injury: Teeth and Oropharynx as per pre-operative assessment

## 2020-01-11 NOTE — Op Note (Signed)
Operative report  Preoperative diagnosis: 1.  Degenerative lumbar disc disease L5-S1 with discogenic back pain and      radiculopathy.  Postlaminectomy syndrome with prior L5-S1      decompression/discectomy.        2.  L2-3 left foraminal disc herniation with L2 radiculopathy  Postoperative diagnosis: Same  Operative procedure: 1.  Anterior lumbar interbody fusion L5-S1    2.  Posterior L2/3 foraminotomy and discectomy.  Complications: None  EBL: 100 cc  Cell Saver: 0  First Assistant: Cleta Alberts, PA for ALIF and disectomy  Vascular surgeon for approach: Dr. Fortunato Curling  Implants: RSB peek intervertebral cage: 14 x 35.  12 degree lordosis.  Anterior 0 profile cage 14 x 39.  Affixed with 30 mm length screws.   Allograft: vivogen  Indications: [name] is a very pleasant active young woman who is been having significant back and radicular leg pain.  Despite prolonged conservative management her overall quality of life has continued to diminish.  Patient had a prior L5-S1 posterior decompression discectomy and unfortunately has recurrent back buttock and radicular leg pain.  Imaging studies also confirmed a foraminal disc herniation at L2-3 with significant compression of the L2 nerve root as well as the dorsal root ganglion.  As result of the severe pain emanating from both locations and the failure to improve with conservative management we elected to proceed with surgery.  All appropriate risks benefits and alternatives were discussed with the patient and consent was obtained.  Operative report: Patient was brought the operating room placed upon the operating room table.  After successful induction of general anesthesia and endotracheal intubation teds SCDs and a Foley were inserted.  The anterior abdomen was prepped and draped in a standard fashion.  Timeout was taken to confirm patient procedure and all other important data.  At this point time Dr. Fortunato Curling performed a standard anterior  retroperitoneal approach to the lumbar spine.  Please refer to his dictation for specifics on the approach.  Self-retaining retractors were placed and the L5-S1 disc space was clearly identified and isolated in the retractor system.  A needle was placed into the L5-S1 disc space and an x-ray confirmed that we are at the appropriate level.  At this point Dr. Carlis Abbott scrubbed out of the case and I proceeded with the decompression.  An annulotomy was performed with a 10 blade scalpel and the bulk of the disc material was removed with pituitary rongeurs, curettes, and Kerrison rongeur.  A distraction plug was placed on the right-hand side and I continue to use my curettes to remove the disc material until I could visualize the posterior annulus.  I then used a small angled curette to dissect behind the vertebral body of S1 and release the annulus.  I also released it from the posterior aspect of the L5 vertebral body and nerve hook was then used to dissect through the posterior annulus and I used a 2 and 3 mm Kerrison rongeur to resect the posterior osteophyte and the majority of the annulus.  Fragments of disc material and bone spur removed from the left posterior lateral corner consistent with what was seen on the preoperative MRI.  Once I had completed the entire decompression and discectomy I then confirmed that parallel endplate distraction under live fluoroscopy.  A laminar spreader was placed in disc space and using live fluoroscopy I opened and closed the space confirming that parallel endplate distraction.  I then used trial implants and elected to  use the size 14 large cage with 12 degree lordosis built in.  This provided the best overall fit in the disc space as well as restoration of the foramen.  The actual peek intervertebral cage was then packed with the bone graft and malleted into position.  X-rays demonstrated satisfactory overall position of the intervertebral cage.  The anterior 0 profile plate was  then malleted over the cage.  X-rays confirm satisfactory position of both the cage and the peek intervertebral spacer.  30 mm screws were then advanced through the cage and into the L5 vertebral body and they both had excellent purchase.  A single 30 mm length screw was placed inferiorly into through the cage and into the S1 vertebral body again with excellent overall purchase.  The locking cap was then secured over the 3 screws to prevent backout.  It was locked according to manufacture standards.  Final x-rays demonstrate satisfactory position of the cage and the plate as well as the 3 screws.  I irrigated the wound copiously normal saline and confirmed that hemostasis.  I then removed each of the retractor sequentially and again confirmed hemostasis.  I then closed the fascia of the rectus with a running #1 PDS suture.  We then closed in a layered fashion with 2-0 Vicryl suture, and a 3-0 Monocryl.  Steri-Strips and a dry dressing were applied.  The patient was transferred to a stretcher and the Douglas frame was applied to the [place] table.  The patient was then turned prone onto the Wilson frame.  The arms were placed in the 90/90 position and all bony prominences were well-padded.  The back was now prepped and draped in a standard fashion.  A second timeout was taken to confirm that we are now moving to the left posterior L2-3 foraminotomy and discectomy.  All important data was conveyed.  2 needles were placed into the back and an x-ray was taken to confirm the L2 foraminal location.  Once I localized the skin incision site and infiltrated with quarter percent Marcaine with epinephrine.  I then made a small incision and bluntly dissected down through the paraspinal muscles until I could feel the L2 pars.  I then used the sequential dilators to expose the area and mobilized the soft tissue.  I then used the [facility] posterior lumbar retractor system to visualize the L2 pars.  I then placed a [place] 4  on the lateral aspect the L2 pars and I took a second x-ray confirming that I was at the L2 pars on the left side.  Once I confirmed the level I used a 2 mm Kerrison rongeur to resect a small portion of the lateral aspect of the pars.  I then identified the facet capsule and used my small nerve hook to mobilize the ligamentum flavum/facet capsule.  I then resected this with a 2 mm Kerrison rongeur.  Using my pituitary rongeurs I dissected through the epidural fat and identified a large foraminal disc herniation consistent with what was seen on the preoperative MRI.  Hemostasis was obtained using bipolar cautery.  I then incised the disc fragment and using a nerve hook and micropituitary rongeurs removed 2 very large fragments of disc material.  I then used an Epstein curette to debride some osteophyte and adequately decompress the L2 nerve root.  Once this was completed I could now visualize the L2 nerve root and was no longer superiorly compressed up against the L2 pedicle.  I could freely pass a nerve  hook underneath the nerve and out into the foramen as well as medially along the undersurface of the thecal sac.  I could also pass it inferiorly into the lateral recess.  At this point the entire area was adequately decompressed and I had removed a significant amount of disc material.  The amount that I removed was consistent with what I visualized on the preoperative MRI.  At this time I irrigated the wound copiously with normal saline and then placed 20 mg (0.5 cc) of Depo-Medrol over the nerve for postoperative analgesia.  Thrombin-soaked Gelfoam patty was placed over the foraminotomy site and I remove the retractor.  Final irrigation was done and there was no active bleeding noted.  I then closed the deep fascia with interrupted #1 Vicryl suture.  Then a layer of 2-0 Vicryl suture and finally 3-0 Monocryl for the skin.  Steri-Strips and dry dressings were applied and the patient was ultimately extubated  transfer the PACU without incident.  The end of the case all needle and sponge counts were correct.  There were no adverse intraoperative events.

## 2020-01-11 NOTE — Transfer of Care (Signed)
Immediate Anesthesia Transfer of Care Note  Patient: [name]  Procedure(s) Performed: ANTERIOR LUMBAR INTERBODY FUSION  LUMBAR FIVE-SACRAL ONE, LEFT LUMBAR TWO-THREE FORAMINOTOMY, DISECTOMY (N/A ) LUMBAR LAMINECTOMY/DECOMPRESSION MICRODISCECTOMY LEFT LUMBAR TWO-THREE (N/A ) ABDOMINAL EXPOSURE (N/A )  Patient Location: PACU  Anesthesia Type:General  Level of Consciousness: awake  Airway & Oxygen Therapy: Patient Spontanous Breathing  Post-op Assessment: Report given to RN and Post -op Vital signs reviewed and stable  Post vital signs: Reviewed and stable  Last Vitals:  Vitals Value Taken Time  BP 131/47 01/11/20 1649  Temp    Pulse 66 01/11/20 1653  Resp 13 01/11/20 1653  SpO2 97 % 01/11/20 1653  Vitals shown include unvalidated device data.  Last Pain:  Vitals:   01/11/20 0935  TempSrc: Oral         Complications: No complications documented.

## 2020-01-12 ENCOUNTER — Encounter (HOSPITAL_COMMUNITY): Payer: Self-pay | Admitting: Orthopedic Surgery

## 2020-01-12 ENCOUNTER — Inpatient Hospital Stay (HOSPITAL_COMMUNITY): Payer: BC Managed Care – PPO

## 2020-01-12 DIAGNOSIS — Z954 Presence of other heart-valve replacement: Secondary | ICD-10-CM

## 2020-01-12 LAB — GLUCOSE, CAPILLARY
Glucose-Capillary: 189 mg/dL — ABNORMAL HIGH (ref 70–99)
Glucose-Capillary: 123 mg/dL — ABNORMAL HIGH (ref 70–99)
Glucose-Capillary: 151 mg/dL — ABNORMAL HIGH (ref 70–99)
Glucose-Capillary: 237 mg/dL — ABNORMAL HIGH (ref 70–99)

## 2020-01-12 LAB — CBC WITH DIFFERENTIAL/PLATELET
Abs Immature Granulocytes: 0.03 10*3/uL (ref 0.00–0.07)
Basophils Absolute: 0 10*3/uL (ref 0.0–0.1)
Basophils Relative: 0 %
Eosinophils Absolute: 0.1 10*3/uL (ref 0.0–0.5)
Eosinophils Relative: 1 %
HCT: 34.6 % — ABNORMAL LOW (ref 36.0–46.0)
Hemoglobin: 11.4 g/dL — ABNORMAL LOW (ref 12.0–15.0)
Immature Granulocytes: 0 %
Lymphocytes Relative: 21 %
Lymphs Abs: 2.5 10*3/uL (ref 0.7–4.0)
MCH: 32 pg (ref 26.0–34.0)
MCHC: 32.9 g/dL (ref 30.0–36.0)
MCV: 97.2 fL (ref 80.0–100.0)
Monocytes Absolute: 0.7 10*3/uL (ref 0.1–1.0)
Monocytes Relative: 6 %
Neutro Abs: 8.3 10*3/uL — ABNORMAL HIGH (ref 1.7–7.7)
Neutrophils Relative %: 72 %
Platelets: 143 10*3/uL — ABNORMAL LOW (ref 150–400)
RBC: 3.56 MIL/uL — ABNORMAL LOW (ref 3.87–5.11)
RDW: 13.3 % (ref 11.5–15.5)
WBC: 11.6 10*3/uL — ABNORMAL HIGH (ref 4.0–10.5)
nRBC: 0 % (ref 0.0–0.2)

## 2020-01-12 NOTE — Progress Notes (Addendum)
[facility]  Subjective  - Doing well tolerating liquids without N/V   Objective 134/68 74 98.5 F (36.9 C) (Oral) 18 94%  Intake/Output Summary (Last 24 hours) at 01/12/2020 0755 Last data filed at 01/12/2020 0452 Gross per 24 hour  Intake 2780 ml  Output 250 ml  Net 2530 ml    Palpable pedal pulse Abdomin soft, incision healing well Lungs non labored breathing  Assessment/Planning: POD # Abdominal exposure for ALIF  Stable from a vascular point of view with palpable pedal pulses.  F/U PRN  Roxy Horseman 01/12/2020 7:55 AM --  Laboratory Lab Results: Recent Labs    01/09/20 1010  WBC 9.8  HGB 14.8  HCT 45.0  PLT 214   BMET Recent Labs    01/09/20 1010  NA 139  K 4.0  CL 105  CO2 25  GLUCOSE 123*  BUN 10  CREATININE 0.64  CALCIUM 9.3    COAG Lab Results  Component Value Date   INR 1.1 01/09/2020   No results found for: PTT  I have seen and evaluated the patient. I agree with the PA note as documented above.  Postop day 1 status post anterior exposure for L5-S1 ALIF.  Her incision looks good.  She does not have any significant abdominal pain.  Tolerating liquids.  Left foot with palpable DP pulse.  Advance diet as tolerated.  Dispo per Dr. Rolena Infante.  Look good for my standpoint.  Marty Heck, MD [facility] Office: [phone]

## 2020-01-12 NOTE — Evaluation (Signed)
Physical Therapy Evaluation Patient Details Name: [name] MRN: [id number] DOB: 1961/03/07 Today's Date: 01/12/2020   History of Present Illness  Pt is a 59 y/o female who presents s/p L5-S1 ALIF and L2-L3 posterior discectomy on 01/11/2020. PMH significant for trichotillomania, DM, scoliosis, respiratory failure, OA, COPD, prior back surgery, dysphagia, CAD, bilateral bursitis of hips, ADD.    Clinical Impression  Pt admitted with above diagnosis. At the time of PT eval, pt was able to demonstrate transfers and ambulation with up to min guard assist for balance support/safety and no AD. Pt with complaints of chest pain getting worse with ambulation, and reporting a similar feeling when she was going into respiratory failure last year. O2 sats during ambulation 84-86% on RA, rebounding to 96% upon seated rest. RN notified. Pt was educated on precautions, brace application/wearing schedule, appropriate activity progression, and car transfer. Pt currently with functional limitations due to the deficits listed below (see PT Problem List). Pt will benefit from skilled PT to increase their independence and safety with mobility to allow discharge to the venue listed below.      Follow Up Recommendations No PT follow up;Supervision for mobility/OOB    Equipment Recommendations  None recommended by PT    Recommendations for Other Services       Precautions / Restrictions Precautions Precautions: Back Precaution Booklet Issued: Yes (comment) Precaution Comments: Reviewed handout and pt was cued for precautions during functional mobility.  Restrictions Weight Bearing Restrictions: No      Mobility  Bed Mobility Overal bed mobility: Needs Assistance Bed Mobility: Rolling;Sidelying to Sit Rolling: Modified independent (Device/Increase time) Sidelying to sit: Supervision       General bed mobility comments: VC's for optimal log roll technique.   Transfers Overall transfer level:  Needs assistance Equipment used: None Transfers: Sit to/from Stand Sit to Stand: Supervision         General transfer comment: Pt demonstrated proper hand placement on seated surface for safety. No assist required and no gross unsteadiness noted.   Ambulation/Gait Ambulation/Gait assistance: Min guard;Supervision Gait Distance (Feet): 300 Feet Assistive device: None Gait Pattern/deviations: Step-through pattern;Decreased stride length;Drifts right/left;Staggering right Gait velocity: Decreased Gait velocity interpretation: <1.31 ft/sec, indicative of household ambulator General Gait Details: Occasional unsteadiness/mild staggering noted in the hall. Pt was able to correct without therapist assist however close guard provided for safety.   Stairs Stairs: Yes Stairs assistance: Min guard Stair Management: One rail Right;Step to pattern;Forwards Number of Stairs: 6 General stair comments: VC's for sequencing and general safety. Pt advancing with LLE to ascend and RLE to descend  Wheelchair Mobility    Modified Rankin (Stroke Patients Only)       Balance Overall balance assessment: Needs assistance Sitting-balance support: No upper extremity supported;Feet supported Sitting balance-Leahy Scale: Fair     Standing balance support: No upper extremity supported;During functional activity Standing balance-Leahy Scale: Poor Standing balance comment: Occasional hands-on assist required                              Pertinent Vitals/Pain Pain Assessment: Faces Faces Pain Scale: Hurts little more Pain Location: Incision site, and chest pain (RN notified) Pain Descriptors / Indicators: Operative site guarding;Grimacing;Discomfort Pain Intervention(s): Limited activity within patient's tolerance;Monitored during session;Repositioned    Home Living Family/patient expects to be discharged to:: Private residence Living Arrangements: Spouse/significant other Available  Help at Discharge: Family;Friend(s);Available 24 hours/day Type of Home: House Home Access: Stairs  to enter Entrance Stairs-Rails: Right Entrance Stairs-Number of Steps: 6 Home Layout: One level Home Equipment: None      Prior Function Level of Independence: Independent               Hand Dominance   Dominant Hand: Right    Extremity/Trunk Assessment   Upper Extremity Assessment Upper Extremity Assessment: Defer to OT evaluation    Lower Extremity Assessment Lower Extremity Assessment: Generalized weakness (Consistent with pre-op diagnosis)    Cervical / Trunk Assessment Cervical / Trunk Assessment: Other exceptions Cervical / Trunk Exceptions: s/p surgery  Communication   Communication: No difficulties  Cognition Arousal/Alertness: Awake/alert Behavior During Therapy: WFL for tasks assessed/performed Overall Cognitive Status: Within Functional Limits for tasks assessed                                        General Comments      Exercises     Assessment/Plan    PT Assessment Patient needs continued PT services  PT Problem List Decreased strength;Decreased activity tolerance;Decreased balance;Decreased mobility;Decreased safety awareness;Decreased knowledge of use of DME;Decreased knowledge of precautions;Cardiopulmonary status limiting activity;Pain       PT Treatment Interventions DME instruction;Gait training;Functional mobility training;Therapeutic activities;Therapeutic exercise;Neuromuscular re-education;Patient/family education    PT Goals (Current goals can be found in the Care Plan section)  Acute Rehab PT Goals Patient Stated Goal: Make sure she does not have PNA. Go home tomorrow PT Goal Formulation: With patient Time For Goal Achievement: 01/19/20 Potential to Achieve Goals: Good    Frequency Min 5X/week   Barriers to discharge        Co-evaluation               AM-PAC PT "6 Clicks" Mobility  Outcome Measure  Help needed turning from your back to your side while in a flat bed without using bedrails?: None Help needed moving from lying on your back to sitting on the side of a flat bed without using bedrails?: None Help needed moving to and from a bed to a chair (including a wheelchair)?: None Help needed standing up from a chair using your arms (e.g., wheelchair or bedside chair)?: None Help needed to walk in hospital room?: A Little Help needed climbing 3-5 steps with a railing? : A Little 6 Click Score: 22    End of Session Equipment Utilized During Treatment: Gait belt Activity Tolerance: Treatment limited secondary to medical complications (Comment) Patient left: in bed;with call bell/phone within reach Nurse Communication: Mobility status;Other (comment) (O2 during mobility 84%, pt concerned due to PMH of resp fail) PT Visit Diagnosis: Unsteadiness on feet (R26.81);Pain Pain - part of body:  (back, chest)    Time: 0837-0900 PT Time Calculation (min) (ACUTE ONLY): 23 min   Charges:   PT Evaluation $PT Eval Moderate Complexity: 1 Mod PT Treatments $Gait Training: 8-22 mins        Rolinda Roan, PT, DPT Acute Rehabilitation Services Pager: [phone] Office: [phone]   Thelma Comp 01/12/2020, 9:59 AM

## 2020-01-12 NOTE — Progress Notes (Signed)
Lower extremity venous has been completed.   Preliminary results in CV Proc.   [name] 01/12/2020 9:41 AM

## 2020-01-12 NOTE — Progress Notes (Signed)
Subjective: 1 Day Post-Op Procedure(s) (LRB): ANTERIOR LUMBAR INTERBODY FUSION  LUMBAR FIVE-SACRAL ONE, LEFT LUMBAR TWO-THREE FORAMINOTOMY, DISECTOMY (N/A) LUMBAR LAMINECTOMY/DECOMPRESSION MICRODISCECTOMY LEFT LUMBAR TWO-THREE (N/A) ABDOMINAL EXPOSURE (N/A) Patient anxious per nursing. Patient expressing apprehansion about going home. Concerned that O2 sat was 88% initially when oximeter placed (it did raise to 94% appropriately). Denies cough.  Patient reports pain as mild.  Radicular leg pain improved. No significant abdominal pain. No calf pain.   Tolerating PO w/o N/V +void, +flatus, -BM +ambulation  Objective: Vital signs in last 24 hours: Temp:  [97.4 F (36.3 C)-99 F (37.2 C)] 99 F (37.2 C) (09/16 1158) Pulse Rate:  [66-77] 70 (09/16 1158) Resp:  [12-19] 18 (09/16 1158) BP: (92-134)/(47-69) 92/54 (09/16 1158) SpO2:  [92 %-100 %] 95 % (09/16 1200) Weight:  [68 kg] 68 kg (09/16 0800)  Intake/Output from previous day: 09/15 0701 - 09/16 0700 In: 2780 [P.O.:480; I.V.:2000; IV Piggyback:150] Out: 250 [Urine:150; Blood:100] Intake/Output this shift: No intake/output data recorded.  No results for input(s): HGB in the last 72 hours. No results for input(s): WBC, RBC, HCT, PLT in the last 72 hours. No results for input(s): NA, K, CL, CO2, BUN, CREATININE, GLUCOSE, CALCIUM in the last 72 hours. No results for input(s): LABPT, INR in the last 72 hours.  Neurologically intact ABD soft Neurovascular intact Sensation intact distally Intact pulses distally Dorsiflexion/Plantar flexion intact Incision: dressing C/D/I No cellulitis present Compartment soft Gastroc non-tender bilaterally. Negative Homan's Doppler negative for DVT  Assessment/Plan: 1 Day Post-Op Procedure(s) (LRB): ANTERIOR LUMBAR INTERBODY FUSION  LUMBAR FIVE-SACRAL ONE, LEFT LUMBAR TWO-THREE FORAMINOTOMY, DISECTOMY (N/A) LUMBAR LAMINECTOMY/DECOMPRESSION MICRODISCECTOMY LEFT LUMBAR TWO-THREE  (N/A) ABDOMINAL EXPOSURE (N/A) Advance diet. Up with therapy with LSO  Pain well controlled with current meds DVT PPx: Lovenox, tedss, SCDs. Ambulation Encourage IS   Plan D/C tomorrow.   Yvonne Kendall Vayda Dungee 01/12/2020, 1:25 PM

## 2020-01-12 NOTE — Evaluation (Signed)
Occupational Therapy Evaluation Patient Details Name: [name] MRN: [id number] DOB: [birth year] Today's Date: 01/12/2020    History of Present Illness Pt is a 59 y/o female who presents s/p L5-S1 ALIF and L2-L3 posterior discectomy on 01/11/2020. PMH significant for trichotillomania, DM, scoliosis, respiratory failure, OA, COPD, prior back surgery, dysphagia, CAD, bilateral bursitis of hips, ADD.   Clinical Impression   All education completed with pt verbalizing understanding. She will have assistance of her boyfriend and mom when she returns home. Pt pleased her L hip pain has subsided. Concerned with feeling as if she is getting PNA, RN aware, encouraged use of IS.    Follow Up Recommendations  No OT follow up    Equipment Recommendations  3 in 1 bedside commode    Recommendations for Other Services       Precautions / Restrictions Precautions Precautions: Back Precaution Booklet Issued: Yes (comment) Precaution Comments: reviewed back precautions related to ADL and IADL Required Braces or Orthoses: Spinal Brace Spinal Brace: Lumbar corset Restrictions Weight Bearing Restrictions: No      Mobility Bed Mobility Overal bed mobility: Needs Assistance Bed Mobility: Rolling;Sidelying to Sit Rolling: Modified independent (Device/Increase time) Sidelying to sit: Supervision       General bed mobility comments: VC's for optimal log roll technique.   Transfers Overall transfer level: Needs assistance Equipment used: None Transfers: Sit to/from Stand Sit to Stand: Supervision         General transfer comment: Pt demonstrated proper hand placement on seated surface for safety. No assist required and no gross unsteadiness noted.     Balance Overall balance assessment: Needs assistance Sitting-balance support: No upper extremity supported;Feet supported Sitting balance-Leahy Scale: Good     Standing balance support: No upper extremity supported;During functional  activity Standing balance-Leahy Scale: Fair Standing balance comment: Occasional hands-on assist required                            ADL either performed or assessed with clinical judgement   ADL Overall ADL's : Needs assistance/impaired Eating/Feeding: Independent   Grooming: Supervision/safety;Standing Grooming Details (indicate cue type and reason): educated in 2 cup method for toothbrushing and use of washcloth on face Upper Body Bathing: Set up;Sitting Upper Body Bathing Details (indicate cue type and reason): has a long handled bath sponge for back Lower Body Bathing: Set up;Supervison/ safety;Sitting/lateral leans;Sit to/from stand;With adaptive equipment Lower Body Bathing Details (indicate cue type and reason): recommended pt use long handled bath sponge on feet Upper Body Dressing : Set up;Sitting   Lower Body Dressing: Supervision/safety;Sit to/from stand Lower Body Dressing Details (indicate cue type and reason): can perform figure four to don socks Toilet Transfer: Supervision/safety;Ambulation Toilet Transfer Details (indicate cue type and reason): needs 3 in 1 for home Toileting- Clothing Manipulation and Hygiene: Supervision/safety;Sitting/lateral lean Toileting - Clothing Manipulation Details (indicate cue type and reason): instructed pt to avoid twisting with pericare, use of tongs     Functional mobility during ADLs: Supervision/safety General ADL Comments: educated in IADL to avoid     Vision Patient Visual Report: No change from baseline       Perception     Praxis      Pertinent Vitals/Pain Pain Assessment: Faces Faces Pain Scale: Hurts little more Pain Location: Incision site Pain Descriptors / Indicators: Operative site guarding;Grimacing;Discomfort Pain Intervention(s): Monitored during session;Repositioned;Premedicated before session     Hand Dominance Right   Extremity/Trunk Assessment Upper Extremity Assessment  Upper Extremity  Assessment: Overall WFL for tasks assessed   Lower Extremity Assessment Lower Extremity Assessment: Defer to PT evaluation   Cervical / Trunk Assessment Cervical / Trunk Assessment: Other exceptions Cervical / Trunk Exceptions: s/p surgery   Communication Communication Communication: No difficulties   Cognition Arousal/Alertness: Awake/alert Behavior During Therapy: WFL for tasks assessed/performed Overall Cognitive Status: Within Functional Limits for tasks assessed                                     General Comments       Exercises     Shoulder Instructions      Home Living Family/patient expects to be discharged to:: Private residence Living Arrangements: Spouse/significant other Available Help at Discharge: Family;Friend(s);Available 24 hours/day Type of Home: House Home Access: Stairs to enter [employer] of Steps: 6 Entrance Stairs-Rails: Right Home Layout: One level     Bathroom Shower/Tub: Occupational psychologist: Standard     Home Equipment: None   Additional Comments: has access to shower seat and lift chair      Prior Functioning/Environment Level of Independence: Independent                 OT Problem List:        OT Treatment/Interventions:      OT Goals(Current goals can be found in the care plan section) Acute Rehab OT Goals Patient Stated Goal: Make sure she does not have PNA. Go home tomorrow OT Goal Formulation: With patient  OT Frequency:     Barriers to D/C:            Co-evaluation              AM-PAC OT "6 Clicks" Daily Activity     Outcome Measure Help from another person eating meals?: None Help from another person taking care of personal grooming?: None Help from another person toileting, which includes using toliet, bedpan, or urinal?: None Help from another person bathing (including washing, rinsing, drying)?: None Help from another person to put on and taking off regular  upper body clothing?: None Help from another person to put on and taking off regular lower body clothing?: None 6 Click Score: 24   End of Session    Activity Tolerance: Patient tolerated treatment well Patient left: in bed;with call bell/phone within reach  OT Visit Diagnosis: Pain;Other abnormalities of gait and mobility (R26.89)                Time: 5277-8242 OT Time Calculation (min): 22 min Charges:  OT General Charges $OT Visit: 1 Visit OT Evaluation $OT Eval Low Complexity: [place], OTR/L Acute Rehabilitation Services Pager: [phone] Office: [phone] Malka So 01/12/2020, 12:46 PM

## 2020-01-12 NOTE — Progress Notes (Signed)
Patient c/o chest pain during deep breathing and SOB after ambulation. O2 sat 82% RA after ambulation, goes to 86% RA at rest, 93% on 2 L O2 via n/c. Bil lung diminished per auscultation with  Noted ronchi on RLL. Also patient is having elevated temp at 100.9, went down to 100.7 after tylenol po. On call notified. Received order for pCXR. Result received. Per on call PA, will consult hospitalist. Will continue to monitor patient closely. Patient made aware  And very appreciative.

## 2020-01-13 ENCOUNTER — Inpatient Hospital Stay (HOSPITAL_COMMUNITY): Payer: BC Managed Care – PPO

## 2020-01-13 DIAGNOSIS — J189 Pneumonia, unspecified organism: Secondary | ICD-10-CM

## 2020-01-13 DIAGNOSIS — J9690 Respiratory failure, unspecified, unspecified whether with hypoxia or hypercapnia: Secondary | ICD-10-CM

## 2020-01-13 DIAGNOSIS — J449 Chronic obstructive pulmonary disease, unspecified: Secondary | ICD-10-CM

## 2020-01-13 DIAGNOSIS — R509 Fever, unspecified: Secondary | ICD-10-CM

## 2020-01-13 DIAGNOSIS — I9589 Other hypotension: Secondary | ICD-10-CM

## 2020-01-13 DIAGNOSIS — Y95 Nosocomial condition: Secondary | ICD-10-CM

## 2020-01-13 DIAGNOSIS — I959 Hypotension, unspecified: Secondary | ICD-10-CM

## 2020-01-13 DIAGNOSIS — E109 Type 1 diabetes mellitus without complications: Secondary | ICD-10-CM

## 2020-01-13 HISTORY — DX: Pneumonia, unspecified organism: J18.9

## 2020-01-13 LAB — GLUCOSE, CAPILLARY
Glucose-Capillary: 87 mg/dL (ref 70–99)
Glucose-Capillary: 110 mg/dL — ABNORMAL HIGH (ref 70–99)
Glucose-Capillary: 108 mg/dL — ABNORMAL HIGH (ref 70–99)
Glucose-Capillary: 125 mg/dL — ABNORMAL HIGH (ref 70–99)

## 2020-01-13 LAB — TROPONIN I (HIGH SENSITIVITY)
Troponin I (High Sensitivity): 9 ng/L (ref <18)
Troponin I (High Sensitivity): 8 ng/L (ref <18)

## 2020-01-13 LAB — PROCALCITONIN: Procalcitonin: <0.1 ng/mL

## 2020-01-13 LAB — COMPREHENSIVE METABOLIC PANEL
ALT: 31 U/L (ref 0–44)
AST: 35 U/L (ref 15–41)
Albumin: 2.4 g/dL — ABNORMAL LOW (ref 3.5–5.0)
Alkaline Phosphatase: 68 U/L (ref 38–126)
Anion gap: 6 (ref 5–15)
BUN: 7 mg/dL (ref 6–20)
CO2: 26 mmol/L (ref 22–32)
Calcium: 8 mg/dL — ABNORMAL LOW (ref 8.9–10.3)
Chloride: 103 mmol/L (ref 98–111)
Creatinine, Ser: 0.59 mg/dL (ref 0.44–1.00)
GFR calc Af Amer: >60 mL/min (ref >60)
GFR calc non Af Amer: >60 mL/min (ref >60)
Glucose, Bld: 91 mg/dL (ref 70–99)
Potassium: 3.6 mmol/L (ref 3.5–5.1)
Sodium: 135 mmol/L (ref 135–145)
Total Bilirubin: 1.5 mg/dL — ABNORMAL HIGH (ref 0.3–1.2)
Total Protein: 4.4 g/dL — ABNORMAL LOW (ref 6.5–8.1)

## 2020-01-13 LAB — LACTIC ACID, PLASMA
Lactic Acid, Venous: 1.2 mmol/L (ref 0.5–1.9)
Lactic Acid, Venous: 2.2 mmol/L (ref 0.5–1.9)

## 2020-01-13 LAB — CBC
HCT: 34.9 % — ABNORMAL LOW (ref 36.0–46.0)
Hemoglobin: 11.1 g/dL — ABNORMAL LOW (ref 12.0–15.0)
MCH: 31.7 pg (ref 26.0–34.0)
MCHC: 31.8 g/dL (ref 30.0–36.0)
MCV: 99.7 fL (ref 80.0–100.0)
Platelets: 120 10*3/uL — ABNORMAL LOW (ref 150–400)
RBC: 3.5 MIL/uL — ABNORMAL LOW (ref 3.87–5.11)
RDW: 13.4 % (ref 11.5–15.5)
WBC: 10.9 10*3/uL — ABNORMAL HIGH (ref 4.0–10.5)
nRBC: 0 % (ref 0.0–0.2)

## 2020-01-13 LAB — SARS CORONAVIRUS 2 BY RT PCR (HOSPITAL ORDER, PERFORMED IN ~~LOC~~ HOSPITAL LAB): SARS Coronavirus 2: NEGATIVE

## 2020-01-13 LAB — HIV ANTIBODY (ROUTINE TESTING W REFLEX): HIV Screen 4th Generation wRfx: NONREACTIVE

## 2020-01-13 MED ORDER — LACTATED RINGERS IV BOLUS
1000.0000 mL | Freq: Once | INTRAVENOUS | Status: AC
  Administered 2020-01-13: 1000 mL via INTRAVENOUS

## 2020-01-13 MED ORDER — SODIUM CHLORIDE 0.9 % IV SOLN
500.0000 mg | Freq: Every day | INTRAVENOUS | Status: DC
  Administered 2020-01-13: 500 mg via INTRAVENOUS
  Filled 2020-01-13: qty 500

## 2020-01-13 MED ORDER — LACTATED RINGERS IV SOLN: INTRAVENOUS | Status: DC

## 2020-01-13 MED ORDER — SODIUM CHLORIDE 0.9 % IV SOLN
250.0000 mL | INTRAVENOUS | Status: DC
  Administered 2020-01-13: 250 mL via INTRAVENOUS

## 2020-01-13 MED ORDER — SODIUM CHLORIDE 0.9 % IV SOLN
2.0000 g | Freq: Every day | INTRAVENOUS | Status: DC
  Filled 2020-01-13: qty 20

## 2020-01-13 MED ORDER — LACTATED RINGERS IV BOLUS
250.0000 mL | Freq: Once | INTRAVENOUS | Status: AC
  Administered 2020-01-13: 250 mL via INTRAVENOUS

## 2020-01-13 MED ORDER — SODIUM CHLORIDE 0.9 % IV SOLN
2.0000 g | Freq: Three times a day (TID) | INTRAVENOUS | Status: AC
  Administered 2020-01-13 – 2020-01-17 (×15): 2 g via INTRAVENOUS
  Filled 2020-01-13 (×16): qty 2

## 2020-01-13 MED ORDER — NOREPINEPHRINE 4 MG/250ML-% IV SOLN
2.0000 ug/min | INTRAVENOUS | Status: DC
  Administered 2020-01-14: 2 ug/min via INTRAVENOUS
  Administered 2020-01-14 – 2020-01-15 (×2): 6 ug/min via INTRAVENOUS
  Administered 2020-01-15 (×2): 10 ug/min via INTRAVENOUS
  Administered 2020-01-16: 1 ug/min via INTRAVENOUS
  Filled 2020-01-13 (×7): qty 250

## 2020-01-13 MED ORDER — CHLORHEXIDINE GLUCONATE CLOTH 2 % EX PADS
6.0000 | MEDICATED_PAD | Freq: Every day | CUTANEOUS | Status: DC
  Administered 2020-01-13 – 2020-01-23 (×11): 6 via TOPICAL

## 2020-01-13 MED ORDER — IOHEXOL 350 MG/ML SOLN
100.0000 mL | Freq: Once | INTRAVENOUS | Status: AC
  Administered 2020-01-13: 65 mL via INTRAVENOUS

## 2020-01-13 NOTE — Progress Notes (Addendum)
Pt's BP continues to trend down, now 79/45 despite 2nd IVF bolus going.  Not responding to fluids at all it seems (going down despite fluids going in).  Pt in no distress at this time, but getting PCCM to come see pt.  CMP still pending (apparently its running right now per lab), was hoping to get creat before sending pt for CTA chest to confirm PNA / rule out PE.  Still think PNA most likely diagnosis.  Probably also responsible for BP being low.  Would call this "septic shock" except for the fact that pt only has 1 out of 4 SIRS criteria still.  Repeat CBC pending.  No ABD pain or visible bleeding.  Pt remains AAOx3 and able to answer questions.  Update: PCCM at bedside seeing pt, sounds like they are going to take her to ICU for vasopressors.  CRITICAL CARE Performed by: Etta Quill.   Total critical care time: 45 minutes  Critical care time was exclusive of separately billable procedures and treating other patients.  Critical care was necessary to treat or prevent imminent or life-threatening deterioration.  Critical care was time spent personally by me on the following activities: development of treatment plan with patient and/or surrogate as well as nursing, discussions with consultants, evaluation of patient's response to treatment, examination of patient, obtaining history from patient or surrogate, ordering and performing treatments and interventions, ordering and review of laboratory studies, ordering and review of radiographic studies, pulse oximetry and re-evaluation of patient's condition.

## 2020-01-13 NOTE — Significant Event (Signed)
Rapid Response Event Note   Reason for Call :  ICU bed request for vasopressors  Initial Focused Assessment:  She is alert and oriented, easily conversant.  She complains of incisional pain on her abdomen and back. She is short of breath and mildly tachypnic.  Lung sounds with scattered rhonchi.  Heart tones regular.  She has 1-2+ edema through out Covid test this am : negative  BP 99/53  SR 78  RR 22  O2 sat 88 % on 2.5L [place] LR bolus currently infusing.  Interventions:  Increased O2 to 6l [place]  O2 sats 92% LR at 100cc/hr after bolus complete CTA chest done Labs drawn  Dr Valeta Harms at bedside to assess patient Spoke with patient and her mother regarding CTA results and plan of care.  Patient up to [facility], voided.  Unable to measure, estimated about 100cc.  Very small BM, soft brown.  Transported to ICU  Plan of Care:     Event Summary:   MD Notified: prior to my arrival Call Time:   Arrival Time: 0720 End Time: [place]  Raliegh Ip, RN

## 2020-01-13 NOTE — Progress Notes (Signed)
eLink Physician-Brief Progress Note Patient Name: [name] DOB: [birth year] MRN: [id number]   Date of Service  01/13/2020  HPI/Events of Note  Patient with hypotension when placed on BIPAP.  eICU Interventions  LR 250 ml iv fluid bolus x 1.        Kerry Kass Sahmir [place] 01/13/2020, 11:13 PM

## 2020-01-13 NOTE — Anesthesia Postprocedure Evaluation (Signed)
Anesthesia Post Note  Patient: [name]  Procedure(s) Performed: ANTERIOR LUMBAR INTERBODY FUSION  LUMBAR FIVE-SACRAL ONE, LEFT LUMBAR TWO-THREE FORAMINOTOMY, DISECTOMY (N/A ) LUMBAR LAMINECTOMY/DECOMPRESSION MICRODISCECTOMY LEFT LUMBAR TWO-THREE (N/A ) ABDOMINAL EXPOSURE (N/A )     Patient location during evaluation: PACU Anesthesia Type: General Level of consciousness: awake and alert Pain management: pain level controlled Vital Signs Assessment: post-procedure vital signs reviewed and stable Respiratory status: spontaneous breathing, nonlabored ventilation, respiratory function stable and patient connected to nasal cannula oxygen Cardiovascular status: blood pressure returned to baseline and stable Postop Assessment: no apparent nausea or vomiting Anesthetic complications: no   No complications documented.  Last Vitals:  Vitals:   01/13/20 0408 01/13/20 0500  BP: (!) 80/45 (!) 79/45  Pulse:  76  Resp:  16  Temp:  37.6 C  SpO2:  91%    Last Pain:  Vitals:   01/13/20 0500  TempSrc: Oral  PainSc:                  [name]

## 2020-01-13 NOTE — Progress Notes (Signed)
    Subjective: Procedure(s) (LRB): ANTERIOR LUMBAR INTERBODY FUSION  LUMBAR FIVE-SACRAL ONE, LEFT LUMBAR TWO-THREE FORAMINOTOMY, DISECTOMY (N/A) LUMBAR LAMINECTOMY/DECOMPRESSION MICRODISCECTOMY LEFT LUMBAR TWO-THREE (N/A) ABDOMINAL EXPOSURE (N/A) 2 Days Post-Op  Patient reports pain as 4 on 0-10 scale.  Reports decreased leg pain reports incisional back pain   Positive void Positive bowel movement Positive flatus Positive shortness of breath - no chest pain  Objective: Vital signs in last 24 hours: Temp:  [98.8 F (37.1 C)-100.9 F (38.3 C)] 100.4 F (38 C) (09/17 0722) Pulse Rate:  [70-93] 84 (09/17 0906) Resp:  [16-25] 23 (09/17 0906) BP: (79-120)/(42-79) 94/52 (09/17 0906) SpO2:  [82 %-97 %] 91 % (09/17 0906)  Intake/Output from previous day: No intake/output data recorded.  Labs: Recent Labs    01/12/20 2254  WBC 11.6*  RBC 3.56*  HCT 34.6*  PLT 143*   Recent Labs    01/13/20 0233  NA 135  K 3.6  CL 103  CO2 26  BUN 7  CREATININE 0.59  GLUCOSE 91  CALCIUM 8.0*   No results for input(s): LABPT, INR in the last 72 hours.  Physical Exam: Neurologically intact ABD soft Intact pulses distally Dorsiflexion/Plantar flexion intact Incision: dressing C/D/I and no drainage Compartment soft Body mass index is 27.44 kg/m.   Assessment/Plan: Patient stable  CT scan: 1. Multifocal pneumonia with lower lobe atelectasis. 2. Negative for pulmonary embolism. Events of yesterday evening are noted.  Patient is currently on 4 E. awaiting transfer to the ICU for closer observation.  CT scan demonstrates multifocal pneumonia with lower lobe atelectasis.  Negative for PE. Lower extremity Doppler completed yesterday shows no evidence of deep vein thrombosis in the lower extremities bilaterally.  No evidence of superficial vein thrombosis in the lower extremities bilaterally. Abdominal exam demonstrates tenderness but she has no significant pain or distention to  suggest acute intra-abdominal pathology. Patient's neurological exam remained stable with no focal deficits.  Preoperative neuropathic pain has improved. Patient is also had hypotension despite appropriate IV fluid hydration. I reviewed the progress notes as well as consultation notes from the hospitalist team and I am very appreciative of their input. At this point time she is being transferred to the ICU for closer observation secondary to the atelectasis and pneumonia.  There is no further recommendations from the spine standpoint. Continue Lovenox for DVT prevention.  Patient instructed on maintaining lower extremity stretching exercises and if possible out of bed to chair with assistance.  We will continue to monitor her progress.  Melina Schools, MD [facility] [phone]

## 2020-01-13 NOTE — Progress Notes (Signed)
Report given to RN on 4N. All questions answered.   Arletta Bale, RN

## 2020-01-13 NOTE — Progress Notes (Signed)
Patient transferred to [id number]. Report given to receiving RN . Hospitalist updated about pt's status.

## 2020-01-13 NOTE — Consult Note (Signed)
NAME:  [name], MRN:  [id number], DOB:  08-21-1960, LOS: 2 ADMISSION DATE:  01/11/2020, CONSULTATION DATE:  01/13/20 REFERRING MD:  Alcario Drought  CHIEF COMPLAINT:  Hypotension   Brief History   [name] is a 59 y.o. female who was admitted 9/15 for lumbar spine fusion.  Overnight 9/16, developed hypotension despite 2L IVF.  CXR with possible PNA.  History of present illness   [name] is a 59 y.o. female who has a PMH as outlined below.  She presented to [facility] 9/15 for anterior lumbar fusion L5 - S1 and posterior L2 - L3 foraminotomy and discectomy.  The case was uncomplicated.  Overnight 9/16, she had hypotension with SBP 80.  She received 2L LR bolus but MAP remained in 50's.  PCCM subsequently asked to see in consultation.  She has had fever to 101 and CXR shows possible PNA.  She required supplemental O2 up to 3L to maintain sats > 90%.  She does endorse mild cough with minimal sputum production along with DOE.  Denies chills/sweats, headaches, N/V/D, abd pain, myalgias.  She states she is prone to PNA and has at least 1 occurrence every year and that this feels similar.  She denies chest pain, light headedness, LE edema, hemoptysis, hx of prior VTE, hx of malignancy.  Past Medical History  has HNP (herniated nucleus pulposus), lumbar; Chest pain; Multiple actinic keratoses; Anxiety disorder; Atherosclerosis; Degeneration of lumbar intervertebral disc; Fatigue; Hemangioma; Primary insomnia; Insulin long-term use ([place]); Menopausal disorder; Purpura ([place]); Trichotillomania; Moderate chronic obstructive pulmonary disease ([place]); Mixed hyperlipidemia; Folic acid deficiency; History of fracture; Malaise and fatigue; Coronary artery disease of native artery of native heart with stable angina pectoris ([place]); Diabetes mellitus type 1, uncomplicated ([place]); Dysphagia; Tobacco abuse; Dyspnea on exertion; Attention deficit; Intermittent claudication ([place]); History of metabolic disorder; Lung mass;  Myositis; Erythrocytosis; Senile purpura ([place]); Overweight with body mass index (BMI) 25.0-29.9; Smoking greater than 30 pack years; Abnormal thyroid function test; Lumbar radiculopathy; Sciatica; Scoliosis deformity of spine; CAD (coronary artery disease); Secondary diabetes mellitus ([place]); Bursitis of both hips; Acute bilateral thoracic back pain; Alcohol use; Chronic bronchitis ([place]); Elevated LFTs; Foot pain; Lumbar post-laminectomy syndrome; Pain of left hip joint; Polyarthralgia; Primary osteoarthritis involving multiple joints; Coronary arteriosclerosis; Arteriosclerotic vascular disease; Cigarette smoker; Tobacco user; Diabetes mellitus due to underlying condition with unspecified complications ([place]); Fusion of lumbar spine; Acute respiratory failure with hypoxia (HCC); and HAP (hospital-acquired pneumonia) on their problem list.  Significant Hospital Events   9/15 > admit. 9/17 > hypotension, transfer to ICU.  Consults:  PCCM, TRH  Procedures:  None.  Significant Diagnostic Tests:  CXR 9/17 > early PNA. CTA chest 9/17 >   Micro Data:  COVID 9/17> neg. Blood 9/17 >  Sputum 9/17 >   Antimicrobials:  Cefepime 9/17 >  Azithro 9/17 > 9/17. Ceftriaxone 9/17 > 9/17.  Interim history/subjective:  Comfortable but states "I have felt better". Has mild cough.  MAP 54.  Objective:  Blood pressure (!) 79/45, pulse 76, temperature 99.7 F (37.6 C), temperature source Oral, resp. rate 16, height 5\' 2"  (1.575 m), weight 68 kg, last menstrual period 05/04/2008, SpO2 91 %.        Intake/Output Summary (Last 24 hours) at 01/13/2020 9702 Last data filed at 01/12/2020 1500 Gross per 24 hour  Intake 0 ml  Output --  Net 0 ml   Filed Weights   01/12/20 0800  Weight: 68 kg    Examination: General: Adult female,  in NAD. Neuro: A&O x 3, no deficits. HEENT: [place]/AT. Sclerae anicteric.  EOMI. Cardiovascular: RRR, no M/R/G.  Lungs: Respirations even and unlabored.  Faint rhonchi in bases  R > L. Abdomen: BS x 4, soft, NT/ND.  Musculoskeletal: No gross deformities, no edema.  Skin: Intact, warm, no rashes.  Assessment & Plan:   Hypotension with early sepsis - s/p 2L LR lactate reassuring. - Additional 1L LR now then LR at 100/hr. - Start low dose levo for goal MAP > 65. - Transfer to ICU for now.  Acute hypoxic respiratory failure - presumed 2/2 CAP; however, can not rule out PE given recent surgery. - Continue supplemental O2 as needed to maintain SpO2 > 92%. - Continue empiric cefepime. - F/u on PCT. - Agree CTA to r/o PE and to further evaluate parenchyma.  Back pain - s/p anterior lumbar fusion L5 - S1 and posterior L2 - L3 foraminotomy and discectomy. - Post op per ortho.  Hx DM. - Continue SSI and lantus.  Best Practice:  Diet: Reg. Pain/Anxiety/Delirium protocol (if indicated): N/A. VAP protocol (if indicated): N/A. DVT prophylaxis: SCD's / lovenox. GI prophylaxis: N/A. Glucose control: SSI + lantus. Mobility: Bedrest. Code Status: Full. Family Communication: None available. Disposition: Transfer to ICU.  Labs   CBC: Recent Labs  Lab 01/09/20 1010 01/12/20 2254  WBC 9.8 11.6*  NEUTROABS  --  8.3*  HGB 14.8 11.4*  HCT 45.0 34.6*  MCV 96.2 97.2  PLT 214 916*   Basic Metabolic Panel: Recent Labs  Lab 01/09/20 1010 01/13/20 0233  NA 139 135  K 4.0 3.6  CL 105 103  CO2 25 26  GLUCOSE 123* 91  BUN 10 7  CREATININE 0.64 0.59  CALCIUM 9.3 8.0*   GFR: Estimated Creatinine Clearance: 67.6 mL/min (by C-G formula based on SCr of 0.59 mg/dL). Recent Labs  Lab 01/09/20 1010 01/12/20 2254 01/13/20 0324  WBC 9.8 11.6*  --   LATICACIDVEN  --   --  1.2   Liver Function Tests: Recent Labs  Lab 01/13/20 0233  AST 35  ALT 31  ALKPHOS 68  BILITOT 1.5*  PROT 4.4*  ALBUMIN 2.4*   No results for input(s): LIPASE, AMYLASE in the last 168 hours. No results for input(s): AMMONIA in the last 168 hours. ABG No results found for: PHART,  PCO2ART, PO2ART, HCO3, TCO2, ACIDBASEDEF, O2SAT  Coagulation Profile: Recent Labs  Lab 01/09/20 1010  INR 1.1   Cardiac Enzymes: No results for input(s): CKTOTAL, CKMB, CKMBINDEX, TROPONINI in the last 168 hours. HbA1C: Hgb A1c MFr Bld  Date/Time Value Ref Range Status  01/09/2020 09:49 AM 8.1 (H) 4.8 - 5.6 % Final    Comment:    (NOTE) Pre diabetes:          5.7%-6.4%  Diabetes:              >6.4%  Glycemic control for   <7.0% adults with diabetes   04/09/2018 11:51 AM 6.8 (H) 4.8 - 5.6 % Final    Comment:    (NOTE) Pre diabetes:          5.7%-6.4% Diabetes:              >6.4% Glycemic control for   <7.0% adults with diabetes    CBG: Recent Labs  Lab 01/11/20 1937 01/12/20 0630 01/12/20 1153 01/12/20 1603 01/12/20 2114  GLUCAP 167* 189* 123* 151* 237*    Review of Systems:   All negative; except for those that are  bolded, which indicate positives.  Constitutional: weight loss, weight gain, night sweats, fevers, chills, fatigue, weakness.  HEENT: headaches, sore throat, sneezing, nasal congestion, post nasal drip, difficulty swallowing, tooth/dental problems, visual complaints, visual changes, ear aches. Neuro: difficulty with speech, weakness, numbness, ataxia. CV:  chest pain, orthopnea, PND, swelling in lower extremities, dizziness, palpitations, syncope.  Resp: cough, hemoptysis, dyspnea, wheezing. GI: heartburn, indigestion, abdominal pain, nausea, vomiting, diarrhea, constipation, change in bowel habits, loss of appetite, hematemesis, melena, hematochezia.  GU: dysuria, change in color of urine, urgency or frequency, flank pain, hematuria. MSK: joint pain or swelling, decreased range of motion. Psych: change in mood or affect, depression, anxiety, suicidal ideations, homicidal ideations. Skin: rash, itching, bruising.   Past medical history  She,  has a past medical history of Abnormal thyroid function test (12/06/2015), Acute bilateral thoracic back  pain (12/03/2018), Alcohol use (04/15/2019), Anxiety disorder (06/05/2015), Arteriosclerotic vascular disease (11/06/2017), Arthritis, Atherosclerosis (06/05/2015), Attention deficit (12/04/2017), Bursitis of both hips (04/14/2018), CAD (coronary artery disease) (12/24/2017), Chest pain (11/06/2017), Chronic bronchitis ([place]) (12/04/2015), Cigarette smoker (02/26/2016), Coronary arteriosclerosis (12/24/2017), Coronary artery disease of native artery of native heart with stable angina pectoris ([place]) (01/25/2016), Degeneration of lumbar intervertebral disc (11/06/2017), Diabetes mellitus type 1, uncomplicated ([place]) (19/41/7408), Diabetes mellitus without complication ([place]), Dysphagia (11/04/2017), Dyspnea on exertion (12/04/2017), Elevated LFTs (03/10/2019), Erythrocytosis (12/04/2017), Fatigue (1/44/8185), Folic acid deficiency (11/06/2017), Foot pain (01/28/2018), Hemangioma (11/06/2017), History of fracture (11/06/2017), History of metabolic disorder (09/29/1495), HNP (herniated nucleus pulposus), lumbar (05/10/2014), Insulin long-term use ([place]) (11/06/2017), Intermittent claudication ([place]) (12/04/2017), Lumbar post-laminectomy syndrome (03/04/2019), Lumbar radiculopathy (05/26/2017), Lung mass (12/04/2017), Malaise and fatigue (11/06/2017), Menopausal disorder (11/06/2017), Mixed hyperlipidemia (04/09/2016), Moderate chronic obstructive pulmonary disease ([place]) (11/06/2017), Multiple actinic keratoses (11/06/2017), Myositis (12/04/2017), Overweight with body mass index (BMI) 25.0-29.9 (07/11/2015), Pain of left hip joint (07/23/2018), Pneumonia, Polyarthralgia (06/11/2018), Primary insomnia (06/05/2015), Primary osteoarthritis involving multiple joints (08/24/2019), Purpura ([place]) (11/06/2017), Respiratory failure ([place]) (11/2018), Sciatica (11/03/2017), Scoliosis deformity of spine (11/03/2017), Secondary diabetes mellitus ([place]) (12/24/2017), Senile purpura ([place]) (12/04/2017), Smoking greater than 30 pack years (02/26/2016), Tobacco abuse (02/11/2016), Tobacco user  (02/11/2016), and Trichotillomania.   Surgical History    Past Surgical History:  Procedure Laterality Date  . ABDOMINAL EXPOSURE N/A 01/11/2020   Procedure: ABDOMINAL EXPOSURE;  Surgeon: Marty Heck, MD;  Location: [place];  Service: Vascular;  Laterality: N/A;  . ANTERIOR LUMBAR FUSION N/A 01/11/2020   Procedure: ANTERIOR LUMBAR INTERBODY FUSION  LUMBAR FIVE-SACRAL ONE, LEFT LUMBAR TWO-THREE FORAMINOTOMY, DISECTOMY;  Surgeon: Melina Schools, MD;  Location: [place];  Service: Orthopedics;  Laterality: N/A;  . APPENDECTOMY  04/28/1970  . BACK SURGERY  2016  . CARDIAC CATHETERIZATION  12/31/2017  . COLONOSCOPY  04/28/2017  . EXCISION/RELEASE BURSA HIP Left 04/14/2018   Procedure: Excision trochanteric bursa left hip;  Surgeon: Susa Day, MD;  Location: [facility];  Service: Orthopedics;  Laterality: Left;  60 mins  . HAND SURGERY    . HIP OPEN REDUCTION Right    ORIF  . KNEE ARTHROSCOPY    . LEFT HEART CATH AND CORONARY ANGIOGRAPHY N/A 12/31/2017   Procedure: LEFT HEART CATH AND CORONARY ANGIOGRAPHY;  Surgeon: Martinique, Peter M, MD;  Location: [facility];  Service: Cardiovascular;  Laterality: N/A;  . LUMBAR LAMINECTOMY/DECOMPRESSION MICRODISCECTOMY Left 05/10/2014   Procedure: MICRODISCECTOMY LUMBAR DECOMPRESSION L5-S1 LEFT ;  Surgeon: Johnn Hai, MD;  Location: [facility];  Service: Orthopedics;  Laterality: Left;  . LUMBAR LAMINECTOMY/DECOMPRESSION MICRODISCECTOMY N/A 01/11/2020   Procedure: LUMBAR LAMINECTOMY/DECOMPRESSION  MICRODISCECTOMY LEFT LUMBAR TWO-THREE;  Surgeon: Melina Schools, MD;  Location: [place];  Service: Orthopedics;  Laterality: N/A;  . ORIF WRIST FRACTURE Right    retained hardware  . ORTHOPAEDIC SURGERY  04/14/2018  . TUBAL LIGATION  12/25/1995     Social History   reports that she has been smoking cigarettes. She has a 15.00 pack-year smoking history. She has never used smokeless tobacco. She reports current alcohol use. She reports that she does not use  drugs.   Family history   Her family history includes Cancer in her mother; Diabetes in her father and mother; Heart disease in her father; Hypertension in her father.   Allergies Allergies  Allergen Reactions  . Celebrex [Celecoxib] Swelling    Face swelling  . Mobic [Meloxicam] Swelling    Face swelling     Home meds  Prior to Admission medications   Medication Sig Start Date End Date Taking? Authorizing Provider  ALPRAZolam Duanne Moron) 0.5 MG tablet Take 0.5 mg by mouth at bedtime.  08/08/19  Yes [provider]  atorvastatin (LIPITOR) 40 MG tablet Take 40 mg by mouth at bedtime. 05/06/19  Yes [provider]  cyanocobalamin (,VITAMIN B-12,) 1000 MCG/ML injection Inject 1,000 mcg into the muscle every 14 (fourteen) days.   Yes [provider]  FLUoxetine (PROZAC) 40 MG capsule Take 80 mg by mouth daily.  07/11/19  Yes [provider]  folic acid (FOLVITE) 1 MG tablet Take 1 mg by mouth at bedtime.    Yes [provider]  gabapentin (NEURONTIN) 100 MG capsule Take 100-300 mg by mouth See admin instructions. Take 100 mg by mouth in the morning and 300 mg at bedtime 08/24/19  Yes [provider]  insulin aspart (NOVOLOG FLEXPEN) 100 UNIT/ML FlexPen Inject 3-4 Units into the skin 3 (three) times daily as needed (blood sugar above 200).  07/26/18  Yes [provider]  insulin degludec (TRESIBA FLEXTOUCH) 200 UNIT/ML FlexTouch Pen Inject 34 Units into the skin daily.  07/19/18  Yes [provider]  linaclotide (LINZESS) 290 MCG CAPS capsule Take 290 mcg by mouth at bedtime.  07/03/17  Yes [provider]  nitroGLYCERIN (NITROSTAT) 0.4 MG SL tablet Place 1 tablet (0.4 mg total) under the tongue every 5 (five) minutes as needed. 09/13/19 01/09/20 Yes Revankar, Reita Cliche, MD  TRUE METRIX BLOOD GLUCOSE TEST test strip CHECK BLOOD SUGARS THREE TIMES DAILY 09/30/19  Yes [provider]    Critical care time: 35 min.     Montey Hora, [facility] 01/13/2020, 6:20 AM

## 2020-01-13 NOTE — Progress Notes (Signed)
CRITICAL VALUE ALERT  Critical Value:  Lactic acid 2.2  Date & Time Notied:  01/13/2020 0937  Pt being transferred to ICU at this moment. Results given to receiving RN.   Arletta Bale, RN

## 2020-01-13 NOTE — Consult Note (Signed)
Medical Consultation   [name]  ZTI:[id number]  DOB: 06-09-1960  DOA: 01/11/2020  PCP: [name], MD   Requesting physician: Griffith Citron PA-C  Reason for consultation: Pneumonia in post-op patient   History of Present Illness: [name] is an 59 y.o. female with h/o IDDM, back pain, moderate COPD, minimal non-occlusive CAD (LHC in 2019 showed 30% stenosis of ost LAD, otherwise normal coronaries).  Pt just underwent lumbar spine surgery for fusion and diskectomy on 9/15.  Back wise she has been doing well since surgery; however, pt yesterday and tonight developing DOE, hypoxia with new O2 requirement, fever up to 100.9, BPs as low as 82/42.  WBC 11.6k tonight.  CXR tonight shows new perihilar infiltrates bilaterally felt to represent pneumonia (not present on CXR just 1 day prior.  Hospitalist is asked to consult.  On evaluation: pt in no acute distress.  Does now have a 3L O2 requirement via [place] to maintain sat of 91%.  Reports pleuritic CP (on exertion or deep breathing) that "feels like pneumonia I get every year".   Review of Systems:  ROS As per HPI otherwise 10 point review of systems negative.     Past Medical History: Past Medical History:  Diagnosis Date  . Abnormal thyroid function test 12/06/2015  . Acute bilateral thoracic back pain 12/03/2018  . Alcohol use 04/15/2019   Formatting of this note might be different from the original. Recommend abstinence from alcohol  . Anxiety disorder 06/05/2015   Last Assessment & Plan:  Formatting of this note might be different from the original. Had started her on some xanax about week ago and she feels it has helped her and she is taking about half tablet for this and will continue with it as she has been compliant with it  . Arteriosclerotic vascular disease 11/06/2017  . Arthritis    back   . Atherosclerosis 06/05/2015   Formatting of this note might be different from the original. Seen on  CTs of chest.  . Attention deficit 12/04/2017  . Bursitis of both hips 04/14/2018  . CAD (coronary artery disease) 12/24/2017  . Chest pain 11/06/2017  . Chronic bronchitis ([place]) 12/04/2015   Formatting of this note might be different from the original. PFT ratio 88% FEV1 103% FVC 94% DLCO 76%  Last Assessment & Plan:  Formatting of this note might be different from the original. We discussed this as she has been coughing again for about 2 mnths and she sounds congested on exam with previous ease for pneumonia will place her on levaquin and have reviewed with her the importance of her   . Cigarette smoker 02/26/2016  . Coronary arteriosclerosis 12/24/2017  . Coronary artery disease of native artery of native heart with stable angina pectoris ([place]) 01/25/2016   Formatting of this note might be different from the original. 2017:  50% LAD lesion.  Calcium noted other places.  Heart cath December 31, 2017 with a 30% ostial LAD lesion otherwise normal coronary arteries seen with LVEF 55 to 65%  . Degeneration of lumbar intervertebral disc 11/06/2017  . Diabetes mellitus type 1, uncomplicated ([place]) 82/50/5397   Added automatically from request for surgery [phone]  Formatting of this note might be different from the original. Added automatically from request for surgery [phone]  . Diabetes mellitus without complication (HCC)    Type 1- injections only- recent change to [employer]  with improved A1C reading.  Marland Kitchen Dysphagia 11/04/2017  . Dyspnea on exertion 12/04/2017  . Elevated LFTs 03/10/2019   Formatting of this note might be different from the original. History.  Cannot change to this.  . Erythrocytosis 12/04/2017  . Fatigue 11/06/2017  . Folic acid deficiency 8/75/6433  . Foot pain 01/28/2018  . Hemangioma 11/06/2017  . History of fracture 11/06/2017  . History of metabolic disorder 06/06/5186  . HNP (herniated nucleus pulposus), lumbar 05/10/2014  . Insulin long-term use ([place]) 11/06/2017  . Intermittent claudication  ([place]) 12/04/2017  . Lumbar post-laminectomy syndrome 03/04/2019  . Lumbar radiculopathy 05/26/2017  . Lung mass 12/04/2017  . Malaise and fatigue 11/06/2017  . Menopausal disorder 11/06/2017  . Mixed hyperlipidemia 04/09/2016   Formatting of this note might be different from the original. Added automatically from request for surgery [id number]  . Moderate chronic obstructive pulmonary disease ([place]) 11/06/2017  . Multiple actinic keratoses 11/06/2017  . Myositis 12/04/2017  . Overweight with body mass index (BMI) 25.0-29.9 07/11/2015   Last Assessment & Plan:  Relevant Hx: Course: Daily Update: Today's Plan:difficult for her with the insulin as well as her abilify and the weight is increaseing for her rather than decreasing, she has cut her alcohol intake back, she has worked on her diet more with decreasing her carb intake as well and still difficulty discussed with the saxenda and will try to get this approved for her and see   . Pain of left hip joint 07/23/2018  . Pneumonia   . Polyarthralgia 06/11/2018  . Primary insomnia 06/05/2015  . Primary osteoarthritis involving multiple joints 08/24/2019  . Purpura ([place]) 11/06/2017  . Respiratory failure ([place]) 11/2018  . Sciatica 11/03/2017  . Scoliosis deformity of spine 11/03/2017  . Secondary diabetes mellitus ([place]) 12/24/2017  . Senile purpura ([place]) 12/04/2017  . Smoking greater than 30 pack years 02/26/2016  . Tobacco abuse 02/11/2016  . Tobacco user 02/11/2016  . Trichotillomania    hx.     Past Surgical History: Past Surgical History:  Procedure Laterality Date  . ABDOMINAL EXPOSURE N/A 01/11/2020   Procedure: ABDOMINAL EXPOSURE;  Surgeon: Marty Heck, MD;  Location: [facility];  Service: Vascular;  Laterality: N/A;  . ANTERIOR LUMBAR FUSION N/A 01/11/2020   Procedure: ANTERIOR LUMBAR INTERBODY FUSION  LUMBAR FIVE-SACRAL ONE, LEFT LUMBAR TWO-THREE FORAMINOTOMY, DISECTOMY;  Surgeon: Melina Schools, MD;  Location: [facility];  Service: Orthopedics;  Laterality:  N/A;  . APPENDECTOMY  04/28/1970  . BACK SURGERY  2016  . CARDIAC CATHETERIZATION  12/31/2017  . COLONOSCOPY  04/28/2017  . EXCISION/RELEASE BURSA HIP Left 04/14/2018   Procedure: Excision trochanteric bursa left hip;  Surgeon: Susa Day, MD;  Location: [facility];  Service: Orthopedics;  Laterality: Left;  60 mins  . HAND SURGERY    . HIP OPEN REDUCTION Right    ORIF  . KNEE ARTHROSCOPY    . LEFT HEART CATH AND CORONARY ANGIOGRAPHY N/A 12/31/2017   Procedure: LEFT HEART CATH AND CORONARY ANGIOGRAPHY;  Surgeon: Martinique, Peter M, MD;  Location: [facility];  Service: Cardiovascular;  Laterality: N/A;  . LUMBAR LAMINECTOMY/DECOMPRESSION MICRODISCECTOMY Left 05/10/2014   Procedure: MICRODISCECTOMY LUMBAR DECOMPRESSION L5-S1 LEFT ;  Surgeon: Johnn Hai, MD;  Location: [facility];  Service: Orthopedics;  Laterality: Left;  . LUMBAR LAMINECTOMY/DECOMPRESSION MICRODISCECTOMY N/A 01/11/2020   Procedure: LUMBAR LAMINECTOMY/DECOMPRESSION MICRODISCECTOMY LEFT LUMBAR TWO-THREE;  Surgeon: Melina Schools, MD;  Location: [facility];  Service: Orthopedics;  Laterality: N/A;  . ORIF WRIST  FRACTURE Right    retained hardware  . ORTHOPAEDIC SURGERY  04/14/2018  . TUBAL LIGATION  12/25/1995     Allergies:   Allergies  Allergen Reactions  . Celebrex [Celecoxib] Swelling    Face swelling  . Mobic [Meloxicam] Swelling    Face swelling     Social History:  reports that she has been smoking cigarettes. She has a 15.00 pack-year smoking history. She has never used smokeless tobacco. She reports current alcohol use. She reports that she does not use drugs.   Family History: Family History  Problem Relation Age of Onset  . Diabetes Mother   . Cancer Mother   . Hypertension Father   . Heart disease Father   . Diabetes Father     Unacceptable: Noncontributory, unremarkable, or negative. Acceptable: Family history reviewed and not pertinent (If you reviewed it)   Physical Exam: Vitals:    01/12/20 1923 01/12/20 2020 01/12/20 2025 01/12/20 2332  BP: 120/79   (!) 93/52  Pulse: 78  77 79  Resp: 18   16  Temp: (!) 100.9 F (38.3 C)  (!) 100.7 F (38.2 C) (!) 100.4 F (38 C)  TempSrc: Oral  Oral Oral  SpO2: 93% (!) 82% 93% 94%  Weight:      Height:        Constitutional: Alert and awake, oriented x3, not in any acute distress. Eyes: PERLA, EOMI, irises appear normal, anicteric sclera,  ENMT: external ears and nose appear normal            Lips appears normal, oropharynx mucosa, tongue, posterior pharynx appear normal  Neck: neck appears normal, no masses, normal ROM, no thyromegaly, no JVD  CVS: S1-S2 clear, no murmur rubs or gallops, no LE edema, normal pedal pulses  Respiratory:  Rhonchi in R base Abdomen: soft nontender, nondistended, normal bowel sounds, no hepatosplenomegaly, no hernias  Musculoskeletal: : no cyanosis, clubbing or edema noted bilaterally  Neuro: Cranial nerves II-XII intact, strength, sensation, reflexes Psych: judgement and insight appear normal, stable mood and affect, mental status Skin: no rashes or lesions or ulcers, no induration or nodules    Data reviewed:  I have personally reviewed following labs and imaging studies Labs:  CBC: Recent Labs  Lab 01/09/20 1010 01/12/20 2254  WBC 9.8 11.6*  NEUTROABS  --  8.3*  HGB 14.8 11.4*  HCT 45.0 34.6*  MCV 96.2 97.2  PLT 214 143*    Basic Metabolic Panel: Recent Labs  Lab 01/09/20 1010  NA 139  K 4.0  CL 105  CO2 25  GLUCOSE 123*  BUN 10  CREATININE 0.64  CALCIUM 9.3   GFR Estimated Creatinine Clearance: 67.6 mL/min (by C-G formula based on SCr of 0.64 mg/dL). Liver Function Tests: No results for input(s): AST, ALT, ALKPHOS, BILITOT, PROT, ALBUMIN in the last 168 hours. No results for input(s): LIPASE, AMYLASE in the last 168 hours. No results for input(s): AMMONIA in the last 168 hours. Coagulation profile Recent Labs  Lab 01/09/20 1010  INR 1.1    Cardiac  Enzymes: No results for input(s): CKTOTAL, CKMB, CKMBINDEX, TROPONINI in the last 168 hours. BNP: Invalid input(s): POCBNP CBG: Recent Labs  Lab 01/11/20 1937 01/12/20 0630 01/12/20 1153 01/12/20 1603 01/12/20 2114  GLUCAP 167* 189* 123* 151* 237*   D-Dimer No results for input(s): DDIMER in the last 72 hours. Hgb A1c No results for input(s): HGBA1C in the last 72 hours. Lipid Profile No results for input(s): CHOL, HDL, LDLCALC, TRIG, CHOLHDL,  LDLDIRECT in the last 72 hours. Thyroid function studies No results for input(s): TSH, T4TOTAL, T3FREE, THYROIDAB in the last 72 hours.  Invalid input(s): FREET3 Anemia work up No results for input(s): VITAMINB12, FOLATE, FERRITIN, TIBC, IRON, RETICCTPCT in the last 72 hours. Urinalysis    Component Value Date/Time   COLORURINE YELLOW 01/09/2020 0949   APPEARANCEUR HAZY (A) 01/09/2020 0949   LABSPEC 1.024 01/09/2020 0949   PHURINE 5.0 01/09/2020 0949   GLUCOSEU NEGATIVE 01/09/2020 0949   HGBUR NEGATIVE 01/09/2020 [place] 01/09/2020 0949   [place] 01/09/2020 0949   PROTEINUR NEGATIVE 01/09/2020 0949   NITRITE NEGATIVE 01/09/2020 0949   LEUKOCYTESUR NEGATIVE 01/09/2020 0949     Microbiology Recent Results (from the past 240 hour(s))  Surgical pcr screen     Status: None   Collection Time: 01/09/20  9:49 AM   Specimen: Nasal Mucosa; Nasal Swab  Result Value Ref Range Status   MRSA, PCR NEGATIVE NEGATIVE Final   Staphylococcus aureus NEGATIVE NEGATIVE Final    Comment: (NOTE) The Xpert SA Assay (FDA approved for NASAL specimens in patients 30 years of age and older), is one component of a comprehensive surveillance program. It is not intended to diagnose infection nor to guide or monitor treatment. Performed at [facility], [place] [address] [place], [place] [address]   SARS CORONAVIRUS 2 (TAT 6-24 HRS) Nasopharyngeal Nasopharyngeal Swab     Status: None   Collection Time: 01/09/20  11:13 AM   Specimen: Nasopharyngeal Swab  Result Value Ref Range Status   SARS Coronavirus 2 NEGATIVE NEGATIVE Final    Comment: (NOTE) SARS-CoV-2 target nucleic acids are NOT DETECTED.  The SARS-CoV-2 RNA is generally detectable in upper and lower respiratory specimens during the acute phase of infection. Negative results do not preclude SARS-CoV-2 infection, do not rule out co-infections with other pathogens, and should not be used as the sole basis for treatment or other patient management decisions. Negative results must be combined with clinical observations, patient history, and epidemiological information. The expected result is Negative.  Fact Sheet for Patients: SugarRoll.be  Fact Sheet for Healthcare Providers: https://www.woods-mathews.com/  This test is not yet approved or cleared by the [place] FDA and  has been authorized for detection and/or diagnosis of SARS-CoV-2 by FDA under an Emergency Use Authorization (EUA). This EUA will remain  in effect (meaning this test can be used) for the duration of the COVID-19 declaration under Se ction 564(b)(1) of the Act, 21 U.S.C. section 360bbb-3(b)(1), unless the authorization is terminated or revoked sooner.  Performed at [facility], [place] [address] [place], [place] [address]        Inpatient Medications:   Scheduled Meds: . ALPRAZolam  0.5 mg Oral QHS  . docusate sodium  100 mg Oral BID  . enoxaparin (LOVENOX) injection  40 mg Subcutaneous Q24H  . FLUoxetine  80 mg Oral Daily  . gabapentin  100 mg Oral Daily  . gabapentin  300 mg Oral QHS  . insulin aspart  0-15 Units Subcutaneous TID WC  . insulin aspart  0-5 Units Subcutaneous QHS  . insulin glargine  34 Units Subcutaneous Daily  . linaclotide  290 mcg Oral QHS  . sodium chloride flush  3 mL Intravenous Q12H   Continuous Infusions: . azithromycin    . lactated ringers Stopped (01/12/20 0451)  .  methocarbamol (ROBAXIN) IV       Radiological Exams on Admission: DG Lumbar Spine 2-3 Views  Result Date: 01/11/2020 CLINICAL DATA:  59 year old female undergoing lumbar spine surgery. EXAM: DG C-ARM 1-60 MIN; LUMBAR SPINE - 2-3 VIEW FLUOROSCOPY TIME:  Fluoroscopy Time:  1 minutes 8 seconds Radiation Exposure Index (if provided by the fluoroscopic device): 45.1 mGy Number of Acquired Spot Images: 0 COMPARISON:  Lumbar radiographs 05/04/2014. FINDINGS: Normal lumbar segmentation on the 2016 comparison. Three intraoperative fluoroscopic spot views of the lumbar spine. There is anterior interbody fusion hardware now at L5-S1. On the final image a surgical probe is also directed toward the L2-L3 disc space. IMPRESSION: Intraoperative fluoroscopy demonstrating L2-L3 level localization and anterior interbody fusion at L5-S1. Electronically Signed   By: Genevie Ann M.D.   On: 01/11/2020 18:13   DG CHEST PORT 1 VIEW  Result Date: 01/12/2020 CLINICAL DATA:  Fever, short of breath, hypoxia EXAM: PORTABLE CHEST 1 VIEW COMPARISON:  01/09/2020 FINDINGS: Single frontal view of the chest demonstrates a stable cardiac silhouette. Linear areas of consolidation are seen in the bilateral perihilar regions, which could reflect developing pneumonia versus atelectasis. No effusion or pneumothorax. No acute bony abnormalities. IMPRESSION: 1. Bilateral perihilar linear consolidation, which could reflect developing bronchopneumonia given clinical presentation. Electronically Signed   By: Randa Ngo M.D.   On: 01/12/2020 21:55   DG C-Arm 1-60 Min  Result Date: 01/11/2020 CLINICAL DATA:  59 year old female undergoing lumbar spine surgery. EXAM: DG C-ARM 1-60 MIN; LUMBAR SPINE - 2-3 VIEW FLUOROSCOPY TIME:  Fluoroscopy Time:  1 minutes 8 seconds Radiation Exposure Index (if provided by the fluoroscopic device): 45.1 mGy Number of Acquired Spot Images: 0 COMPARISON:  Lumbar radiographs 05/04/2014. FINDINGS: Normal lumbar  segmentation on the 2016 comparison. Three intraoperative fluoroscopic spot views of the lumbar spine. There is anterior interbody fusion hardware now at L5-S1. On the final image a surgical probe is also directed toward the L2-L3 disc space. IMPRESSION: Intraoperative fluoroscopy demonstrating L2-L3 level localization and anterior interbody fusion at L5-S1. Electronically Signed   By: Genevie Ann M.D.   On: 01/11/2020 18:13   VAS [place] LOWER EXTREMITY VENOUS (DVT)  Result Date: 01/12/2020  Lower Venous DVTStudy Indications: S/p alif.  Comparison Study: no prior Performing Technologist: Abram Sander RVS  Examination Guidelines: A complete evaluation includes B-mode imaging, spectral Doppler, color Doppler, and power Doppler as needed of all accessible portions of each vessel. Bilateral testing is considered an integral part of a complete examination. Limited examinations for reoccurring indications may be performed as noted. The reflux portion of the exam is performed with the patient in reverse Trendelenburg.  +---------+---------------+---------+-----------+----------+--------------+ RIGHT    CompressibilityPhasicitySpontaneityPropertiesThrombus Aging +---------+---------------+---------+-----------+----------+--------------+ CFV      Full           Yes      Yes                                 +---------+---------------+---------+-----------+----------+--------------+ SFJ      Full                                                        +---------+---------------+---------+-----------+----------+--------------+ FV Prox  Full                                                        +---------+---------------+---------+-----------+----------+--------------+  FV Mid   Full                                                        +---------+---------------+---------+-----------+----------+--------------+ FV DistalFull                                                         +---------+---------------+---------+-----------+----------+--------------+ PFV      Full                                                        +---------+---------------+---------+-----------+----------+--------------+ POP      Full           Yes      Yes                                 +---------+---------------+---------+-----------+----------+--------------+ PTV      Full                                                        +---------+---------------+---------+-----------+----------+--------------+ PERO     Full                                                        +---------+---------------+---------+-----------+----------+--------------+   +---------+---------------+---------+-----------+----------+--------------+ LEFT     CompressibilityPhasicitySpontaneityPropertiesThrombus Aging +---------+---------------+---------+-----------+----------+--------------+ CFV      Full           Yes      Yes                                 +---------+---------------+---------+-----------+----------+--------------+ SFJ      Full                                                        +---------+---------------+---------+-----------+----------+--------------+ FV Prox  Full                                                        +---------+---------------+---------+-----------+----------+--------------+ FV Mid   Full                                                        +---------+---------------+---------+-----------+----------+--------------+  FV DistalFull                                                        +---------+---------------+---------+-----------+----------+--------------+ PFV      Full                                                        +---------+---------------+---------+-----------+----------+--------------+ POP      Full           Yes      Yes                                  +---------+---------------+---------+-----------+----------+--------------+ PTV      Full                                                        +---------+---------------+---------+-----------+----------+--------------+ PERO     Full                                                        +---------+---------------+---------+-----------+----------+--------------+     Summary: BILATERAL: - No evidence of deep vein thrombosis seen in the lower extremities, bilaterally. - No evidence of superficial venous thrombosis in the lower extremities, bilaterally. -   *See table(s) above for measurements and observations. Electronically signed by Monica Martinez MD on 01/12/2020 at 4:11:44 PM.    Final    DG OR LOCAL ABDOMEN  Result Date: 01/11/2020 CLINICAL DATA:  Final instrument count. EXAM: OR LOCAL ABDOMEN COMPARISON:  None. FINDINGS: The bowel gas pattern is normal. Phleboliths are noted in the pelvis. Status post surgical anterior fusion of L5-S1. Surgical clips are seen anterior to the sacrum. No definite evidence of other foreign body is noted. IMPRESSION: Surgical clips are seen anterior to the sacrum. No definite evidence of other foreign body is noted. These results were called by telephone at the time of interpretation on 01/11/2020 at 2:53 pm to provider Jerelyn Charles, who verbally acknowledged these results. Electronically Signed   By: Marijo Conception M.D.   On: 01/11/2020 14:54    Impression/Recommendations Principal Problem:   Fusion of lumbar spine Active Problems:   Moderate chronic obstructive pulmonary disease (HCC)   Diabetes mellitus type 1, uncomplicated (HCC)   Acute respiratory failure with hypoxia (HCC)   HAP (hospital-acquired pneumonia)  1. Fusion of lumbar spine - 1. POD #2 2. Sounds like she is doing well from this perspective per requesting PA. 2. Acute respiratory failure with hypoxia secondary to HAP - 1. Given recent surgery, pleuritic CP: ACS and PE are  considered; however, are felt less likely 1. Trop still pending 2. Want to see how she responds to fluids and what renal function is at this time before ordering CTA chest, though may end  up ordering this shortly to R/O PE and confirm PNA diagnosis. 2. PNA pathway 3. Cont pulse ox, O2 requirement now 3L to maintain sat of 91% 4. Empiric ABx: 1. Due to post-op PNA will broaden the rocephin to cefepime for the moment 2. Azithromycin 3. Really dont have a good reason to add vancomycin for the moment: MRSA PCR nares was negative on 9/13 pre-op 4. Cultures pending (sputum and blood) 5. Repeat COVID test 6. Check lactic acid 7. 1L LR bolus for low BPs in the 80s currently 8. Tylenol for fever 9. Follow WBC 10. Currently has Fever but no other SIRS at this time to complete the formal 2 out of 4 required for formal sepsis diagnosis. 11. Checking Lactic acid 12. Checking procalcitonin 13. Ordering tele monitor 14. Ordering transfer to progressive floor for the moment. 3. IDDM - 1. Cont Lantus 34u daily 2. Cont mod scale SSI AC/HS   Thank you for this consultation.  Our [facility] hospitalist team will follow the patient with you.   Time Spent: 90 min  Satvik Parco M. D.O. [facility] 01/13/2020, 2:39 AM

## 2020-01-13 NOTE — Progress Notes (Signed)
PT Cancellation Note  Patient Details Name: [name] MRN: [id number] DOB: [birth year]   Cancelled Treatment:    Reason Eval/Treat Not Completed: Patient not medically ready. Pt with medical decline and transferred to ICU. Lactic acid elevated, febrile, hypotensive. Will follow up when medically ready.   Benjiman Core, PTA Pager [phone] Acute Rehab   Allena Katz 01/13/2020, 10:00 AM

## 2020-01-13 NOTE — Progress Notes (Signed)
[facility]  Subjective  -patient became hypotensive overnight as well as increasing oxygen requirements.  Now on 6 L nasal cannula this morning.  Concern for pneumonia as CT demonstrates no PE but likely multifocal pneumonia.  This morning she tells me that she has a sore throat and feels achy all over.  Covid test repeated was negative.   Objective (!) 95/50 78 (!) 100.4 F (38 C) (Oral) (!) 25 97%  Intake/Output Summary (Last 24 hours) at 01/13/2020 1026 Last data filed at 01/12/2020 1500 Gross per 24 hour  Intake 0 ml  Output --  Net 0 ml    Palpable DP pulses bilateral lower extremities Abdominal exam is soft with no rebound or guarding.  She has appropriate postop incisional tenderness over the left lower quadrant incision over the rectus muscle.  Laboratory Lab Results: Recent Labs    01/12/20 2254 01/13/20 0827  WBC 11.6* 10.9*  HGB 11.4* 11.1*  HCT 34.6* 34.9*  PLT 143* 120*   BMET Recent Labs    01/13/20 0233  NA 135  K 3.6  CL 103  CO2 26  GLUCOSE 91  BUN 7  CREATININE 0.59  CALCIUM 8.0*    COAG Lab Results  Component Value Date   INR 1.1 01/09/2020   No results found for: PTT  Assessment/Planning:  59 year old female now postop day 2 status post L5-S1 ALIF.  Unfortunately she developed hypotension as well as increasing oxygen requirements and now there is concern for multifocal pneumonia.  Repeat Covid test was negative.    I do not think there is any abdominal pathology at this time given reassuring abdominal exam with no rebound or guarding and only appropriate postop incisional pain.  She has good pulses in her feet.  DVT study yesterday was negative for any acute DVT in lower extremities.  Vascular surgery certainly available if needed.  States she is not having any nausea or other issues with oral intake just not hungry.  Marty Heck 01/13/2020 10:26 AM --

## 2020-01-13 NOTE — Progress Notes (Signed)
[facility] Progress Note Patient Name: [name] DOB: 1960/10/23 MRN: [id number]   Date of Service  01/13/2020  HPI/Events of Note  Patient saturation ranging from 89 % to 91 % while asleep.  eICU Interventions  BIPAP ordered targeting saturation > 92 %.        Kerry Kass Henretta Quist 01/13/2020, 10:45 PM

## 2020-01-13 NOTE — Progress Notes (Signed)
Pharmacy Antibiotic Note  [name] is a 59 y.o. female admitted on 01/11/2020 for surgery, now w/ concern for pneumonia.  Pharmacy has been consulted for cefepime dosing.  Plan: Cefepime 2g IV Q8H.  Height: 5\' 2"  (157.5 cm) Weight: 68 kg (150 lb) IBW/kg (Calculated) : 50.1  Temp (24hrs), Avg:99.6 F (37.6 C), Min:98.5 F (36.9 C), Max:100.9 F (38.3 C)  Recent Labs  Lab 01/09/20 1010 01/12/20 2254  WBC 9.8 11.6*  CREATININE 0.64  --     Estimated Creatinine Clearance: 67.6 mL/min (by C-G formula based on SCr of 0.64 mg/dL).    Allergies  Allergen Reactions  . Celebrex [Celecoxib] Swelling    Face swelling  . Mobic [Meloxicam] Swelling    Face swelling    Thank you for allowing pharmacy to be a part of this patient's care.  Wynona Neat, PharmD, BCPS  01/13/2020 2:40 AM

## 2020-01-14 ENCOUNTER — Inpatient Hospital Stay (HOSPITAL_COMMUNITY): Payer: BC Managed Care – PPO

## 2020-01-14 DIAGNOSIS — M4326 Fusion of spine, lumbar region: Secondary | ICD-10-CM

## 2020-01-14 DIAGNOSIS — R0602 Shortness of breath: Secondary | ICD-10-CM

## 2020-01-14 LAB — BASIC METABOLIC PANEL
Anion gap: 7 (ref 5–15)
BUN: 6 mg/dL (ref 6–20)
CO2: 26 mmol/L (ref 22–32)
Calcium: 8.3 mg/dL — ABNORMAL LOW (ref 8.9–10.3)
Chloride: 102 mmol/L (ref 98–111)
Creatinine, Ser: 0.61 mg/dL (ref 0.44–1.00)
GFR calc Af Amer: >60 mL/min (ref >60)
GFR calc non Af Amer: >60 mL/min (ref >60)
Glucose, Bld: 93 mg/dL (ref 70–99)
Potassium: 3.6 mmol/L (ref 3.5–5.1)
Sodium: 135 mmol/L (ref 135–145)

## 2020-01-14 LAB — CBC
HCT: 34.5 % — ABNORMAL LOW (ref 36.0–46.0)
Hemoglobin: 11.1 g/dL — ABNORMAL LOW (ref 12.0–15.0)
MCH: 31.7 pg (ref 26.0–34.0)
MCHC: 32.2 g/dL (ref 30.0–36.0)
MCV: 98.6 fL (ref 80.0–100.0)
Platelets: 127 10*3/uL — ABNORMAL LOW (ref 150–400)
RBC: 3.5 MIL/uL — ABNORMAL LOW (ref 3.87–5.11)
RDW: 13.5 % (ref 11.5–15.5)
WBC: 13.6 10*3/uL — ABNORMAL HIGH (ref 4.0–10.5)
nRBC: 0 % (ref 0.0–0.2)

## 2020-01-14 LAB — ECHOCARDIOGRAM COMPLETE
Area-P 1/2: 3.12 cm2
Height: 62 in
S' Lateral: 2.9 cm
Weight: 2400.02 oz

## 2020-01-14 LAB — GLUCOSE, CAPILLARY
Glucose-Capillary: 76 mg/dL (ref 70–99)
Glucose-Capillary: 55 mg/dL — ABNORMAL LOW (ref 70–99)
Glucose-Capillary: 103 mg/dL — ABNORMAL HIGH (ref 70–99)
Glucose-Capillary: 111 mg/dL — ABNORMAL HIGH (ref 70–99)
Glucose-Capillary: 137 mg/dL — ABNORMAL HIGH (ref 70–99)

## 2020-01-14 LAB — LACTIC ACID, PLASMA: Lactic Acid, Venous: 2 mmol/L (ref 0.5–1.9)

## 2020-01-14 LAB — MAGNESIUM: Magnesium: 1.8 mg/dL (ref 1.7–2.4)

## 2020-01-14 LAB — PHOSPHORUS: Phosphorus: 2.7 mg/dL (ref 2.5–4.6)

## 2020-01-14 MED ORDER — WHITE PETROLATUM EX OINT
TOPICAL_OINTMENT | CUTANEOUS | Status: AC
  Filled 2020-01-14: qty 28.35

## 2020-01-14 MED ORDER — DEXTROSE 50 % IV SOLN
INTRAVENOUS | Status: AC
  Filled 2020-01-14: qty 50

## 2020-01-14 MED ORDER — INSULIN GLARGINE 100 UNIT/ML ~~LOC~~ SOLN
20.0000 [IU] | Freq: Every day | SUBCUTANEOUS | Status: DC
  Filled 2020-01-14 (×2): qty 0.2

## 2020-01-14 MED ORDER — FUROSEMIDE 10 MG/ML IJ SOLN
40.0000 mg | Freq: Once | INTRAMUSCULAR | Status: AC
  Administered 2020-01-14: 40 mg via INTRAVENOUS
  Filled 2020-01-14: qty 4

## 2020-01-14 NOTE — Progress Notes (Signed)
PT Cancellation Note  Patient Details Name: [name] MRN: [id number] DOB: [birth year]   Cancelled Treatment:    Reason Eval/Treat Not Completed: Medical issues which prohibited therapy; on Bipap and possibly needing intubation.  Will attempt again another day.   [name] 01/14/2020, 12:31 PM [name], PT Acute Rehabilitation Services Pager:[phone] Office:[phone] 01/14/2020

## 2020-01-14 NOTE — Progress Notes (Signed)
    Subjective: Procedure(s) (LRB): ANTERIOR LUMBAR INTERBODY FUSION  LUMBAR FIVE-SACRAL ONE, LEFT LUMBAR TWO-THREE FORAMINOTOMY, DISECTOMY (N/A) LUMBAR LAMINECTOMY/DECOMPRESSION MICRODISCECTOMY LEFT LUMBAR TWO-THREE (N/A) ABDOMINAL EXPOSURE (N/A) 3 Days Post-Op  Patient reports pain as 3 on 0-10 scale.  Reports decreased leg pain reports incisional back pain   Positive void Negative bowel movement Positive flatus Positive  shortness of breath   Objective: Vital signs in last 24 hours: Temp:  [98.8 F (37.1 C)-101.2 F (38.4 C)] 100.5 F (38.1 C) (09/18 0400) Pulse Rate:  [74-101] 79 (09/18 0700) Resp:  [18-33] 21 (09/18 0700) BP: (82-111)/(37-61) 102/59 (09/18 0700) SpO2:  [70 %-97 %] 93 % (09/18 0700)  Intake/Output from previous day: 09/17 0701 - 09/18 0700 In: 2748.2 [P.O.:240; I.V.:1958.1; IV Piggyback:550.1] Out: -   Labs: Recent Labs    01/13/20 0827 01/14/20 0356  WBC 10.9* 13.6*  RBC 3.50* 3.50*  HCT 34.9* 34.5*  PLT 120* 127*   Recent Labs    01/13/20 0233 01/14/20 0356  NA 135 135  K 3.6 3.6  CL 103 102  CO2 26 26  BUN 7 6  CREATININE 0.59 0.61  GLUCOSE 91 93  CALCIUM 8.0* 8.3*   No results for input(s): LABPT, INR in the last 72 hours.  Physical Exam: Neurologically intact ABD soft Intact pulses distally Incision: dressing C/D/I Compartment soft Body mass index is 27.44 kg/m.   Assessment/Plan: Patient s/p ALIF L5/S1 with posterior disectomy L2/3 on 01/11/20.  On evening of POD#2 she started having SOB and hypotension Currently in ICU with BiPap to maintain O2 sats. Patient having difficulty breathing and maintaining sats. CXR from this AM reviewed - agree with radiology report No evidence of surgical complication.  HCT stable - no evidence to suggest infection or bleeding Spoke with CCM team: 1. Continue supportive care for pulmonary issue. 2. Continue abx for presumed pneumonia 3. May need intubation if breathing becomes more  labored and sats not maintained with bipap   No recommendation from spine stand point.  Will continue to monitor progress.  Melina Schools, MD [facility] [phone]

## 2020-01-14 NOTE — Progress Notes (Signed)
*  PRELIMINARY RESULTS* Echocardiogram 2D Echocardiogram has been performed.  [name] 01/14/2020, 3:16 PM

## 2020-01-14 NOTE — Progress Notes (Signed)
RT placed patient on 15L [place] to give patient a break from the bipap. Patient tolerating well at this time and says her breathing feels better. RN aware. RT will continue to monitor.

## 2020-01-14 NOTE — Progress Notes (Signed)
   I evaluated patient status post L5-S1 exposure for a left.  She is in the ICU for pulmonary issues.  H&H are stable and current issues do not appear to be related to surgical exposure.  Vascular surgery will be available as needed.  Davette Nugent C. Donzetta Matters, MD [facility] Office: [phone] Pager: [phone]

## 2020-01-14 NOTE — Consult Note (Signed)
NAME:  [name], MRN:  [id number], DOB:  February 16, 1961, LOS: 3 ADMISSION DATE:  01/11/2020, CONSULTATION DATE:  01/13/20 REFERRING MD:  Alcario Drought  CHIEF COMPLAINT:  Hypotension   Brief History   [name] is a 59 y.o. female who was admitted 9/15 for lumbar spine fusion.  Overnight 9/16, developed hypotension despite 2L IVF.  CXR with possible PNA.  History of present illness   [name] is a 59 y.o. female who has a PMH as outlined below.  She presented to [facility] 9/15 for anterior lumbar fusion L5 - S1 and posterior L2 - L3 foraminotomy and discectomy.  The case was uncomplicated.  Overnight 9/16, she had hypotension with SBP 80.  She received 2L LR bolus but MAP remained in 50's.  PCCM subsequently asked to see in consultation.  She has had fever to 101 and CXR shows possible PNA.  She required supplemental O2 up to 3L to maintain sats > 90%.  She does endorse mild cough with minimal sputum production along with DOE.  Denies chills/sweats, headaches, N/V/D, abd pain, myalgias.  She states she is prone to PNA and has at least 1 occurrence every year and that this feels similar.  She denies chest pain, light headedness, LE edema, hemoptysis, hx of prior VTE, hx of malignancy.  Past Medical History  has HNP (herniated nucleus pulposus), lumbar; Chest pain; Multiple actinic keratoses; Anxiety disorder; Atherosclerosis; Degeneration of lumbar intervertebral disc; Fatigue; Hemangioma; Primary insomnia; Insulin long-term use ([place]); Menopausal disorder; Purpura ([place]); Trichotillomania; Moderate chronic obstructive pulmonary disease ([place]); Mixed hyperlipidemia; Folic acid deficiency; History of fracture; Malaise and fatigue; Coronary artery disease of native artery of native heart with stable angina pectoris ([place]); Diabetes mellitus type 1, uncomplicated ([place]); Dysphagia; Tobacco abuse; Dyspnea on exertion; Attention deficit; Intermittent claudication ([place]); History of metabolic disorder; Lung mass;  Myositis; Erythrocytosis; Senile purpura ([place]); Overweight with body mass index (BMI) 25.0-29.9; Smoking greater than 30 pack years; Abnormal thyroid function test; Lumbar radiculopathy; Sciatica; Scoliosis deformity of spine; CAD (coronary artery disease); Secondary diabetes mellitus ([place]); Bursitis of both hips; Acute bilateral thoracic back pain; Alcohol use; Chronic bronchitis ([place]); Elevated LFTs; Foot pain; Lumbar post-laminectomy syndrome; Pain of left hip joint; Polyarthralgia; Primary osteoarthritis involving multiple joints; Coronary arteriosclerosis; Arteriosclerotic vascular disease; Cigarette smoker; Tobacco user; Diabetes mellitus due to underlying condition with unspecified complications ([place]); Fusion of lumbar spine; Respiratory failure ([place]); HAP (hospital-acquired pneumonia); Fever; Hypotension; and Sepsis ([place]) on their problem list.  Significant Hospital Events   9/15 > admit. 9/17 > hypotension, transfer to ICU.  Consults:  PCCM, TRH  Procedures:  None.  Significant Diagnostic Tests:  CXR 9/17 > early PNA. CTA chest 9/17 >   Micro Data:  COVID 9/17> neg. Blood 9/17 >  Sputum 9/17 >   Antimicrobials:  Cefepime 9/17 >  Azithro 9/17 > 9/17. Ceftriaxone 9/17 > 9/17.  Interim history/subjective:  Escalating O2 needs. Now on BiPAP. Mild cough, not very productive. desats on HFNC with going to bathroom. Says had similar admission for resp failure at [facility] 1 year ago. [name] they could not figure out what was wrong.   Objective:  Blood pressure (!) 118/56, pulse 76, temperature (!) 100.6 F (38.1 C), temperature source Axillary, resp. rate (!) 24, height 5\' 2"  (1.575 m), weight 68 kg, last menstrual period 05/04/2008, SpO2 92 %.    FiO2 (%):  [80 %] 80 %   Intake/Output Summary (Last 24 hours) at 01/14/2020 1154 Last data filed at 01/14/2020  1100 Gross per 24 hour  Intake 2793.32 ml  Output 1000 ml  Net 1793.32 ml   Filed Weights   01/12/20 0800  Weight: 68 kg      Examination: General: Adult female, in NAD. Neuro: A&O x 3, no deficits. HEENT: [place]/AT. Sclerae anicteric.  EOMI. Cardiovascular: RRR, no M/R/G.  Lungs: Respirations even and unlabored on BiPAP. Abdomen: BS x 4, soft, NT/ND.  Musculoskeletal: No gross deformities, no edema.  Skin: Intact, warm, no rashes.  Assessment & Plan:   Hypotension presumed due to septic shock - - Adequately fluid resuscitated, now with signs of volume overload - Low dose levo for goal MAP > 65.  Acute hypoxic respiratory failure - presumed 2/2 CAP; differential includes pulmonary edema given central GGOs on CT. Escalating O2 needs. Comfortable on BiPAP, suspect volume contributing. [place], faint scattered GGOs on CT cardiac coronary 09/2019 begs question of underlying lung disease/ILD. - BiPAP, HFNC to eat - Continue empiric cefepime. - Diurese - Consider empiric steroids  Back pain - s/p anterior lumbar fusion L5 - S1 and posterior L2 - L3 foraminotomy and discectomy. - Post op per ortho.  Hx DM. - Continue SSI and long acting, decreased long acting insulin 9/18 34 to 20 given tight control  Best Practice:  Diet: Reg. Pain/Anxiety/Delirium protocol (if indicated): N/A. VAP protocol (if indicated): N/A. DVT prophylaxis: SCD's / lovenox. GI prophylaxis: N/A. Glucose control: SSI + lantus. Mobility: Bedrest. Code Status: Full. Family Communication: None available. Disposition: Transfer to ICU.  Labs   CBC: Recent Labs  Lab 01/09/20 1010 01/12/20 2254 01/13/20 0827 01/14/20 0356  WBC 9.8 11.6* 10.9* 13.6*  NEUTROABS  --  8.3*  --   --   HGB 14.8 11.4* 11.1* 11.1*  HCT 45.0 34.6* 34.9* 34.5*  MCV 96.2 97.2 99.7 98.6  PLT 214 143* 120* 182*   Basic Metabolic Panel: Recent Labs  Lab 01/09/20 1010 01/13/20 0233 01/14/20 0356  NA 139 135 135  K 4.0 3.6 3.6  CL 105 103 102  CO2 25 26 26   GLUCOSE 123* 91 93  BUN 10 7 6   CREATININE 0.64 0.59 0.61  CALCIUM 9.3 8.0* 8.3*  MG  --    --  1.8  PHOS  --   --  2.7   GFR: Estimated Creatinine Clearance: 67.6 mL/min (by C-G formula based on SCr of 0.61 mg/dL). Recent Labs  Lab 01/09/20 1010 01/12/20 2254 01/13/20 0233 01/13/20 0324 01/13/20 0827 01/14/20 0356 01/14/20 0852  PROCALCITON  --   --  <0.10  --   --   --   --   WBC 9.8 11.6*  --   --  10.9* 13.6*  --   LATICACIDVEN  --   --   --  1.2 2.2*  --  2.0*   Liver Function Tests: Recent Labs  Lab 01/13/20 0233  AST 35  ALT 31  ALKPHOS 68  BILITOT 1.5*  PROT 4.4*  ALBUMIN 2.4*   No results for input(s): LIPASE, AMYLASE in the last 168 hours. No results for input(s): AMMONIA in the last 168 hours. ABG No results found for: PHART, PCO2ART, PO2ART, HCO3, TCO2, ACIDBASEDEF, O2SAT  Coagulation Profile: Recent Labs  Lab 01/09/20 1010  INR 1.1   Cardiac Enzymes: No results for input(s): CKTOTAL, CKMB, CKMBINDEX, TROPONINI in the last 168 hours. HbA1C: Hgb A1c MFr Bld  Date/Time Value Ref Range Status  01/09/2020 09:49 AM 8.1 (H) 4.8 - 5.6 % Final    Comment:    (  NOTE) Pre diabetes:          5.7%-6.4%  Diabetes:              >6.4%  Glycemic control for   <7.0% adults with diabetes   04/09/2018 11:51 AM 6.8 (H) 4.8 - 5.6 % Final    Comment:    (NOTE) Pre diabetes:          5.7%-6.4% Diabetes:              >6.4% Glycemic control for   <7.0% adults with diabetes    CBG: Recent Labs  Lab 01/13/20 1106 01/13/20 1656 01/13/20 2157 01/14/20 0719 01/14/20 1146  GLUCAP 110* 108* 125* 76 55*    Review of Systems:   All negative; except for those that are bolded, which indicate positives.  Constitutional: weight loss, weight gain, night sweats, fevers, chills, fatigue, weakness.  HEENT: headaches, sore throat, sneezing, nasal congestion, post nasal drip, difficulty swallowing, tooth/dental problems, visual complaints, visual changes, ear aches. Neuro: difficulty with speech, weakness, numbness, ataxia. CV:  chest pain, orthopnea, PND,  swelling in lower extremities, dizziness, palpitations, syncope.  Resp: cough, hemoptysis, dyspnea, wheezing. GI: heartburn, indigestion, abdominal pain, nausea, vomiting, diarrhea, constipation, change in bowel habits, loss of appetite, hematemesis, melena, hematochezia.  GU: dysuria, change in color of urine, urgency or frequency, flank pain, hematuria. MSK: joint pain or swelling, decreased range of motion. Psych: change in mood or affect, depression, anxiety, suicidal ideations, homicidal ideations. Skin: rash, itching, bruising.   Past medical history  She,  has a past medical history of Abnormal thyroid function test (12/06/2015), Acute bilateral thoracic back pain (12/03/2018), Alcohol use (04/15/2019), Anxiety disorder (06/05/2015), Arteriosclerotic vascular disease (11/06/2017), Arthritis, Atherosclerosis (06/05/2015), Attention deficit (12/04/2017), Bursitis of both hips (04/14/2018), CAD (coronary artery disease) (12/24/2017), Chest pain (11/06/2017), Chronic bronchitis ([place]) (12/04/2015), Cigarette smoker (02/26/2016), Coronary arteriosclerosis (12/24/2017), Coronary artery disease of native artery of native heart with stable angina pectoris ([place]) (01/25/2016), Degeneration of lumbar intervertebral disc (11/06/2017), Diabetes mellitus type 1, uncomplicated ([place]) (56/31/4970), Diabetes mellitus without complication ([place]), Dysphagia (11/04/2017), Dyspnea on exertion (12/04/2017), Elevated LFTs (03/10/2019), Erythrocytosis (12/04/2017), Fatigue (2/63/7858), Folic acid deficiency (11/06/2017), Foot pain (01/28/2018), Hemangioma (11/06/2017), History of fracture (11/06/2017), History of metabolic disorder (12/01/275), HNP (herniated nucleus pulposus), lumbar (05/10/2014), Insulin long-term use ([place]) (11/06/2017), Intermittent claudication ([place]) (12/04/2017), Lumbar post-laminectomy syndrome (03/04/2019), Lumbar radiculopathy (05/26/2017), Lung mass (12/04/2017), Malaise and fatigue (11/06/2017), Menopausal disorder (11/06/2017), Mixed  hyperlipidemia (04/09/2016), Moderate chronic obstructive pulmonary disease ([place]) (11/06/2017), Multiple actinic keratoses (11/06/2017), Myositis (12/04/2017), Overweight with body mass index (BMI) 25.0-29.9 (07/11/2015), Pain of left hip joint (07/23/2018), Pneumonia, Polyarthralgia (06/11/2018), Primary insomnia (06/05/2015), Primary osteoarthritis involving multiple joints (08/24/2019), Purpura ([place]) (11/06/2017), Respiratory failure ([place]) (11/2018), Sciatica (11/03/2017), Scoliosis deformity of spine (11/03/2017), Secondary diabetes mellitus ([place]) (12/24/2017), Senile purpura ([place]) (12/04/2017), Smoking greater than 30 pack years (02/26/2016), Tobacco abuse (02/11/2016), Tobacco user (02/11/2016), and Trichotillomania.   Surgical History    Past Surgical History:  Procedure Laterality Date  . ABDOMINAL EXPOSURE N/A 01/11/2020   Procedure: ABDOMINAL EXPOSURE;  Surgeon: Marty Heck, MD;  Location: [facility];  Service: Vascular;  Laterality: N/A;  . ANTERIOR LUMBAR FUSION N/A 01/11/2020   Procedure: ANTERIOR LUMBAR INTERBODY FUSION  LUMBAR FIVE-SACRAL ONE, LEFT LUMBAR TWO-THREE FORAMINOTOMY, DISECTOMY;  Surgeon: Melina Schools, MD;  Location: [facility];  Service: Orthopedics;  Laterality: N/A;  . APPENDECTOMY  04/28/1970  . BACK SURGERY  2016  . CARDIAC CATHETERIZATION  12/31/2017  . COLONOSCOPY  04/28/2017  .  EXCISION/RELEASE BURSA HIP Left 04/14/2018   Procedure: Excision trochanteric bursa left hip;  Surgeon: Susa Day, MD;  Location: [facility];  Service: Orthopedics;  Laterality: Left;  60 mins  . HAND SURGERY    . HIP OPEN REDUCTION Right    ORIF  . KNEE ARTHROSCOPY    . LEFT HEART CATH AND CORONARY ANGIOGRAPHY N/A 12/31/2017   Procedure: LEFT HEART CATH AND CORONARY ANGIOGRAPHY;  Surgeon: Martinique, Peter M, MD;  Location: [facility];  Service: Cardiovascular;  Laterality: N/A;  . LUMBAR LAMINECTOMY/DECOMPRESSION MICRODISCECTOMY Left 05/10/2014   Procedure: MICRODISCECTOMY LUMBAR DECOMPRESSION L5-S1  LEFT ;  Surgeon: Johnn Hai, MD;  Location: [facility];  Service: Orthopedics;  Laterality: Left;  . LUMBAR LAMINECTOMY/DECOMPRESSION MICRODISCECTOMY N/A 01/11/2020   Procedure: LUMBAR LAMINECTOMY/DECOMPRESSION MICRODISCECTOMY LEFT LUMBAR TWO-THREE;  Surgeon: Melina Schools, MD;  Location: [facility];  Service: Orthopedics;  Laterality: N/A;  . ORIF WRIST FRACTURE Right    retained hardware  . ORTHOPAEDIC SURGERY  04/14/2018  . TUBAL LIGATION  12/25/1995     Social History   reports that she has been smoking cigarettes. She has a 15.00 pack-year smoking history. She has never used smokeless tobacco. She reports current alcohol use. She reports that she does not use drugs.   Family history   Her family history includes Cancer in her mother; Diabetes in her father and mother; Heart disease in her father; Hypertension in her father.   Allergies Allergies  Allergen Reactions  . Celebrex [Celecoxib] Swelling    Face swelling  . Mobic [Meloxicam] Swelling    Face swelling     Home meds  Prior to Admission medications   Medication Sig Start Date End Date Taking? Authorizing Provider  ALPRAZolam Duanne Moron) 0.5 MG tablet Take 0.5 mg by mouth at bedtime.  08/08/19  Yes [provider]  atorvastatin (LIPITOR) 40 MG tablet Take 40 mg by mouth at bedtime. 05/06/19  Yes [provider]  cyanocobalamin (,VITAMIN B-12,) 1000 MCG/ML injection Inject 1,000 mcg into the muscle every 14 (fourteen) days.   Yes [provider]  FLUoxetine (PROZAC) 40 MG capsule Take 80 mg by mouth daily.  07/11/19  Yes [provider]  folic acid (FOLVITE) 1 MG tablet Take 1 mg by mouth at bedtime.    Yes [provider]  gabapentin (NEURONTIN) 100 MG capsule Take 100-300 mg by mouth See admin instructions. Take 100 mg by mouth in the morning and 300 mg at bedtime 08/24/19  Yes [provider]  insulin aspart (NOVOLOG FLEXPEN) 100 UNIT/ML FlexPen Inject 3-4 Units into the skin 3  (three) times daily as needed (blood sugar above 200).  07/26/18  Yes [provider]  insulin degludec (TRESIBA FLEXTOUCH) 200 UNIT/ML FlexTouch Pen Inject 34 Units into the skin daily.  07/19/18  Yes [provider]  linaclotide (LINZESS) 290 MCG CAPS capsule Take 290 mcg by mouth at bedtime.  07/03/17  Yes [provider]  nitroGLYCERIN (NITROSTAT) 0.4 MG SL tablet Place 1 tablet (0.4 mg total) under the tongue every 5 (five) minutes as needed. 09/13/19 01/09/20 Yes Revankar, Reita Cliche, MD  TRUE METRIX BLOOD GLUCOSE TEST test strip CHECK BLOOD SUGARS THREE TIMES DAILY 09/30/19  Yes [provider]    Critical care time: CRITICAL CARE Performed by: Lanier Clam   Total critical care time: 40 minutes  Critical care time was exclusive of separately billable procedures and treating other patients.  Critical care was necessary to treat or prevent  imminent or life-threatening deterioration.  Critical care was time spent personally by me on the following activities: development of treatment plan with patient and/or surrogate as well as nursing, discussions with consultants, evaluation of patient's response to treatment, examination of patient, obtaining history from patient or surrogate, ordering and performing treatments and interventions, ordering and review of laboratory studies, ordering and review of radiographic studies, pulse oximetry and re-evaluation of patient's condition.

## 2020-01-15 ENCOUNTER — Inpatient Hospital Stay: Payer: Self-pay

## 2020-01-15 LAB — GLUCOSE, CAPILLARY
Glucose-Capillary: 55 mg/dL — ABNORMAL LOW (ref 70–99)
Glucose-Capillary: 76 mg/dL (ref 70–99)
Glucose-Capillary: 126 mg/dL — ABNORMAL HIGH (ref 70–99)
Glucose-Capillary: 205 mg/dL — ABNORMAL HIGH (ref 70–99)
Glucose-Capillary: 297 mg/dL — ABNORMAL HIGH (ref 70–99)

## 2020-01-15 LAB — BASIC METABOLIC PANEL
Anion gap: 11 (ref 5–15)
BUN: 5 mg/dL — ABNORMAL LOW (ref 6–20)
CO2: 27 mmol/L (ref 22–32)
Calcium: 8.3 mg/dL — ABNORMAL LOW (ref 8.9–10.3)
Chloride: 97 mmol/L — ABNORMAL LOW (ref 98–111)
Creatinine, Ser: 0.54 mg/dL (ref 0.44–1.00)
GFR calc Af Amer: >60 mL/min (ref >60)
GFR calc non Af Amer: >60 mL/min (ref >60)
Glucose, Bld: 70 mg/dL (ref 70–99)
Potassium: 4 mmol/L (ref 3.5–5.1)
Sodium: 135 mmol/L (ref 135–145)

## 2020-01-15 LAB — CBC WITH DIFFERENTIAL/PLATELET
Abs Immature Granulocytes: 0.03 10*3/uL (ref 0.00–0.07)
Basophils Absolute: 0 10*3/uL (ref 0.0–0.1)
Basophils Relative: 0 %
Eosinophils Absolute: 0.2 10*3/uL (ref 0.0–0.5)
Eosinophils Relative: 1 %
HCT: 35 % — ABNORMAL LOW (ref 36.0–46.0)
Hemoglobin: 11.4 g/dL — ABNORMAL LOW (ref 12.0–15.0)
Immature Granulocytes: 0 %
Lymphocytes Relative: 7 %
Lymphs Abs: 0.9 10*3/uL (ref 0.7–4.0)
MCH: 31.6 pg (ref 26.0–34.0)
MCHC: 32.6 g/dL (ref 30.0–36.0)
MCV: 97 fL (ref 80.0–100.0)
Monocytes Absolute: 0.7 10*3/uL (ref 0.1–1.0)
Monocytes Relative: 6 %
Neutro Abs: 11.2 10*3/uL — ABNORMAL HIGH (ref 1.7–7.7)
Neutrophils Relative %: 86 %
Platelets: 141 10*3/uL — ABNORMAL LOW (ref 150–400)
RBC: 3.61 MIL/uL — ABNORMAL LOW (ref 3.87–5.11)
RDW: 13 % (ref 11.5–15.5)
WBC: 13.1 10*3/uL — ABNORMAL HIGH (ref 4.0–10.5)
nRBC: 0 % (ref 0.0–0.2)

## 2020-01-15 LAB — MAGNESIUM: Magnesium: 1.7 mg/dL (ref 1.7–2.4)

## 2020-01-15 LAB — HEMOGLOBIN AND HEMATOCRIT, BLOOD
HCT: 34.7 % — ABNORMAL LOW (ref 36.0–46.0)
Hemoglobin: 11.5 g/dL — ABNORMAL LOW (ref 12.0–15.0)

## 2020-01-15 LAB — PROCALCITONIN: Procalcitonin: 2.27 ng/mL

## 2020-01-15 MED ORDER — LACTATED RINGERS IV BOLUS
1000.0000 mL | Freq: Once | INTRAVENOUS | Status: AC
  Administered 2020-01-15: 1000 mL via INTRAVENOUS

## 2020-01-15 MED ORDER — INSULIN DEGLUDEC 200 UNIT/ML ~~LOC~~ SOPN
20.0000 [IU] | PEN_INJECTOR | Freq: Every day | SUBCUTANEOUS | Status: DC
  Administered 2020-01-15 – 2020-01-16 (×2): 20 [IU] via SUBCUTANEOUS

## 2020-01-15 MED ORDER — SODIUM CHLORIDE 0.9% FLUSH
10.0000 mL | INTRAVENOUS | Status: DC | PRN
  Administered 2020-01-17: 10 mL

## 2020-01-15 MED ORDER — METHYLPREDNISOLONE SODIUM SUCC 125 MG IJ SOLR
125.0000 mg | Freq: Every day | INTRAMUSCULAR | Status: AC
  Administered 2020-01-15 – 2020-01-17 (×3): 125 mg via INTRAVENOUS
  Filled 2020-01-15 (×3): qty 2

## 2020-01-15 MED ORDER — SODIUM CHLORIDE 0.9% FLUSH
10.0000 mL | Freq: Two times a day (BID) | INTRAVENOUS | Status: DC
  Administered 2020-01-15 – 2020-01-19 (×7): 10 mL
  Administered 2020-01-19: 20 mL
  Administered 2020-01-20 – 2020-01-23 (×4): 10 mL

## 2020-01-15 NOTE — Consult Note (Addendum)
NAME:  [name], MRN:  [id number], DOB:  1960/09/06, LOS: 4 ADMISSION DATE:  01/11/2020, CONSULTATION DATE:  01/13/20 REFERRING MD:  Alcario Drought  CHIEF COMPLAINT:  Hypotension   Brief History   [name] is a 59 y.o. female who was admitted 9/15 for lumbar spine fusion.  Overnight 9/16, developed hypotension despite 2L IVF.  CXR with possible PNA.  History of present illness   [name] is a 59 y.o. female who has a PMH as outlined below.  She presented to [facility] 9/15 for anterior lumbar fusion L5 - S1 and posterior L2 - L3 foraminotomy and discectomy.  The case was uncomplicated.  Overnight 9/16, she had hypotension with SBP 80.  She received 2L LR bolus but MAP remained in 50's.  PCCM subsequently asked to see in consultation.  She has had fever to 101 and CXR shows possible PNA.  She required supplemental O2 up to 3L to maintain sats > 90%.  She does endorse mild cough with minimal sputum production along with DOE.  Denies chills/sweats, headaches, N/V/D, abd pain, myalgias.  She states she is prone to PNA and has at least 1 occurrence every year and that this feels similar.  She denies chest pain, light headedness, LE edema, hemoptysis, hx of prior VTE, hx of malignancy.  Past Medical History  has HNP (herniated nucleus pulposus), lumbar; Chest pain; Multiple actinic keratoses; Anxiety disorder; Atherosclerosis; Degeneration of lumbar intervertebral disc; Fatigue; Hemangioma; Primary insomnia; Insulin long-term use ([place]); Menopausal disorder; Purpura ([place]); Trichotillomania; Moderate chronic obstructive pulmonary disease ([place]); Mixed hyperlipidemia; Folic acid deficiency; History of fracture; Malaise and fatigue; Coronary artery disease of native artery of native heart with stable angina pectoris ([place]); Diabetes mellitus type 1, uncomplicated ([place]); Dysphagia; Tobacco abuse; Dyspnea on exertion; Attention deficit; Intermittent claudication ([place]); History of metabolic disorder; Lung mass;  Myositis; Erythrocytosis; Senile purpura ([place]); Overweight with body mass index (BMI) 25.0-29.9; Smoking greater than 30 pack years; Abnormal thyroid function test; Lumbar radiculopathy; Sciatica; Scoliosis deformity of spine; CAD (coronary artery disease); Secondary diabetes mellitus ([place]); Bursitis of both hips; Acute bilateral thoracic back pain; Alcohol use; Chronic bronchitis ([place]); Elevated LFTs; Foot pain; Lumbar post-laminectomy syndrome; Pain of left hip joint; Polyarthralgia; Primary osteoarthritis involving multiple joints; Coronary arteriosclerosis; Arteriosclerotic vascular disease; Cigarette smoker; Tobacco user; Diabetes mellitus due to underlying condition with unspecified complications ([place]); Fusion of lumbar spine; Respiratory failure ([place]); HAP (hospital-acquired pneumonia); Fever; Hypotension; and Sepsis ([place]) on their problem list.  Significant Hospital Events   9/15 > admit. 9/17 > hypotension, transfer to ICU.  Consults:  PCCM, TRH  Procedures:  None.  Significant Diagnostic Tests:  CXR 9/17 > early PNA. CTA chest 9/17 >   Micro Data:  COVID 9/17> neg. Blood 9/17 >  Sputum 9/17 >   Antimicrobials:  Cefepime 9/17 >  Azithro 9/17 > 9/17. Ceftriaxone 9/17 > 9/17.  Interim history/subjective:  Diuresed yday with mild improvement. Still requires 15l HFNC. Reviewed prior CT cardiac at length, concern for underlying ILD with flare or other non-infectious pna.   Objective:  Blood pressure (!) 114/52, pulse 78, temperature 98.3 F (36.8 C), temperature source Oral, resp. rate (!) 21, height 5\' 2"  (1.575 m), weight 68 kg, last menstrual period 05/04/2008, SpO2 96 %.        Intake/Output Summary (Last 24 hours) at 01/15/2020 0950 Last data filed at 01/15/2020 0600 Gross per 24 hour  Intake 1198.97 ml  Output 2650 ml  Net -1451.03 ml   Danley Danker  Weights   01/12/20 0800  Weight: 68 kg    Examination: General: Adult female, in NAD. Neuro: A&O x 3, no  deficits. HEENT: [place]/AT. Sclerae anicteric.  EOMI. Cardiovascular: RRR, no M/R/G.  Lungs: Respirations even and unlabored on HFNC. Abdomen: BS x 4, soft, NT/ND.  Musculoskeletal: No gross deformities, no edema.  Skin: Intact, warm, no rashes.  Assessment & Plan:   Hypotension: unclear cause. Possible septic shock due to Pna but procalcitonin undetectable. Inflammatory response to surgery, lung inflammation? Adrenal insufficiency? Has had courses of steroids before --NE MAP > 65 --Consult for PICC today --Continue cefepime, 5 day course --Steroids as below   Acute hypoxic respiratory failure - presumed 2/2 CAP; differential includes pulmonary edema given central GGOs on CT. Mild improvement with diuresis but still very hypoxemic. Highest suspicion for ILD, has had respiratory failure in the past and gotten steroids. Working to track down records and [employer] from [place]. - BiPAP, HFNC - Continue empiric cefepime. - Methylprednisolone 125 mg IV daily x 3 days (9/19-9/21), plan to follow with 60 mg daily -Hold diuresis for now given stable hypotension, TTE reassuring.  Back pain - s/p anterior lumbar fusion L5 - S1 and posterior L2 - L3 foraminotomy and discectomy. - Post op per ortho.  Hx DM. - Continue SSI and home long acting 34 u daily, may need more with steroids  Best Practice:  Diet: Reg. Pain/Anxiety/Delirium protocol (if indicated): N/A. VAP protocol (if indicated): N/A. DVT prophylaxis: SCD's / lovenox. GI prophylaxis: N/A. Glucose control: SSI + home long acting Mobility: Bedrest. Code Status: Full. Family Communication: None available. Disposition: ICU.  Labs   CBC: Recent Labs  Lab 01/09/20 1010 01/09/20 1010 01/12/20 2254 01/13/20 0827 01/14/20 0356 01/15/20 0659 01/15/20 0806  WBC 9.8  --  11.6* 10.9* 13.6*  --  13.1*  NEUTROABS  --   --  8.3*  --   --   --  11.2*  HGB 14.8   < > 11.4* 11.1* 11.1* 11.5* 11.4*  HCT 45.0   < > 34.6* 34.9* 34.5* 34.7*  35.0*  MCV 96.2  --  97.2 99.7 98.6  --  97.0  PLT 214  --  143* 120* 127*  --  141*   < > = values in this interval not displayed.   Basic Metabolic Panel: Recent Labs  Lab 01/09/20 1010 01/13/20 0233 01/14/20 0356 01/15/20 0806  NA 139 135 135 135  K 4.0 3.6 3.6 4.0  CL 105 103 102 97*  CO2 25 26 26 27   GLUCOSE 123* 91 93 70  BUN 10 7 6  5*  CREATININE 0.64 0.59 0.61 0.54  CALCIUM 9.3 8.0* 8.3* 8.3*  MG  --   --  1.8 1.7  PHOS  --   --  2.7  --    GFR: Estimated Creatinine Clearance: 67.6 mL/min (by C-G formula based on SCr of 0.54 mg/dL). Recent Labs  Lab 01/12/20 2254 01/13/20 0233 01/13/20 0324 01/13/20 0827 01/14/20 0356 01/14/20 0852 01/15/20 0806  PROCALCITON  --  <0.10  --   --   --   --  2.27  WBC 11.6*  --   --  10.9* 13.6*  --  13.1*  LATICACIDVEN  --   --  1.2 2.2*  --  2.0*  --    Liver Function Tests: Recent Labs  Lab 01/13/20 0233  AST 35  ALT 31  ALKPHOS 68  BILITOT 1.5*  PROT 4.4*  ALBUMIN 2.4*   No results  for input(s): LIPASE, AMYLASE in the last 168 hours. No results for input(s): AMMONIA in the last 168 hours. ABG No results found for: PHART, PCO2ART, PO2ART, HCO3, TCO2, ACIDBASEDEF, O2SAT  Coagulation Profile: Recent Labs  Lab 01/09/20 1010  INR 1.1   Cardiac Enzymes: No results for input(s): CKTOTAL, CKMB, CKMBINDEX, TROPONINI in the last 168 hours. HbA1C: Hgb A1c MFr Bld  Date/Time Value Ref Range Status  01/09/2020 09:49 AM 8.1 (H) 4.8 - 5.6 % Final    Comment:    (NOTE) Pre diabetes:          5.7%-6.4%  Diabetes:              >6.4%  Glycemic control for   <7.0% adults with diabetes   04/09/2018 11:51 AM 6.8 (H) 4.8 - 5.6 % Final    Comment:    (NOTE) Pre diabetes:          5.7%-6.4% Diabetes:              >6.4% Glycemic control for   <7.0% adults with diabetes    CBG: Recent Labs  Lab 01/14/20 1244 01/14/20 1631 01/14/20 2213 01/15/20 0751 01/15/20 0815  GLUCAP 103* 111* 137* 55* 76      Past  medical history  She,  has a past medical history of Abnormal thyroid function test (12/06/2015), Acute bilateral thoracic back pain (12/03/2018), Alcohol use (04/15/2019), Anxiety disorder (06/05/2015), Arteriosclerotic vascular disease (11/06/2017), Arthritis, Atherosclerosis (06/05/2015), Attention deficit (12/04/2017), Bursitis of both hips (04/14/2018), CAD (coronary artery disease) (12/24/2017), Chest pain (11/06/2017), Chronic bronchitis ([place]) (12/04/2015), Cigarette smoker (02/26/2016), Coronary arteriosclerosis (12/24/2017), Coronary artery disease of native artery of native heart with stable angina pectoris ([place]) (01/25/2016), Degeneration of lumbar intervertebral disc (11/06/2017), Diabetes mellitus type 1, uncomplicated ([place]) (02/77/4128), Diabetes mellitus without complication ([place]), Dysphagia (11/04/2017), Dyspnea on exertion (12/04/2017), Elevated LFTs (03/10/2019), Erythrocytosis (12/04/2017), Fatigue (7/86/7672), Folic acid deficiency (11/06/2017), Foot pain (01/28/2018), Hemangioma (11/06/2017), History of fracture (11/06/2017), History of metabolic disorder (0/12/4707), HNP (herniated nucleus pulposus), lumbar (05/10/2014), Insulin long-term use ([place]) (11/06/2017), Intermittent claudication ([place]) (12/04/2017), Lumbar post-laminectomy syndrome (03/04/2019), Lumbar radiculopathy (05/26/2017), Lung mass (12/04/2017), Malaise and fatigue (11/06/2017), Menopausal disorder (11/06/2017), Mixed hyperlipidemia (04/09/2016), Moderate chronic obstructive pulmonary disease ([place]) (11/06/2017), Multiple actinic keratoses (11/06/2017), Myositis (12/04/2017), Overweight with body mass index (BMI) 25.0-29.9 (07/11/2015), Pain of left hip joint (07/23/2018), Pneumonia, Polyarthralgia (06/11/2018), Primary insomnia (06/05/2015), Primary osteoarthritis involving multiple joints (08/24/2019), Purpura ([place]) (11/06/2017), Respiratory failure ([place]) (11/2018), Sciatica (11/03/2017), Scoliosis deformity of spine (11/03/2017), Secondary diabetes mellitus ([place]) (12/24/2017),  Senile purpura ([place]) (12/04/2017), Smoking greater than 30 pack years (02/26/2016), Tobacco abuse (02/11/2016), Tobacco user (02/11/2016), and Trichotillomania.   Surgical History    Past Surgical History:  Procedure Laterality Date   ABDOMINAL EXPOSURE N/A 01/11/2020   Procedure: ABDOMINAL EXPOSURE;  Surgeon: Marty Heck, MD;  Location: [facility];  Service: Vascular;  Laterality: N/A;   ANTERIOR LUMBAR FUSION N/A 01/11/2020   Procedure: ANTERIOR LUMBAR INTERBODY FUSION  LUMBAR FIVE-SACRAL ONE, LEFT LUMBAR TWO-THREE FORAMINOTOMY, DISECTOMY;  Surgeon: Melina Schools, MD;  Location: [facility];  Service: Orthopedics;  Laterality: N/A;   APPENDECTOMY  04/28/1970   BACK SURGERY  2016   CARDIAC CATHETERIZATION  12/31/2017   COLONOSCOPY  04/28/2017   EXCISION/RELEASE BURSA HIP Left 04/14/2018   Procedure: Excision trochanteric bursa left hip;  Surgeon: Susa Day, MD;  Location: [facility];  Service: Orthopedics;  Laterality: Left;  60 mins   HAND SURGERY     HIP OPEN REDUCTION Right  ORIF   KNEE ARTHROSCOPY     LEFT HEART CATH AND CORONARY ANGIOGRAPHY N/A 12/31/2017   Procedure: LEFT HEART CATH AND CORONARY ANGIOGRAPHY;  Surgeon: Martinique, Peter M, MD;  Location: [facility];  Service: Cardiovascular;  Laterality: N/A;   LUMBAR LAMINECTOMY/DECOMPRESSION MICRODISCECTOMY Left 05/10/2014   Procedure: MICRODISCECTOMY LUMBAR DECOMPRESSION L5-S1 LEFT ;  Surgeon: Johnn Hai, MD;  Location: [facility];  Service: Orthopedics;  Laterality: Left;   LUMBAR LAMINECTOMY/DECOMPRESSION MICRODISCECTOMY N/A 01/11/2020   Procedure: LUMBAR LAMINECTOMY/DECOMPRESSION MICRODISCECTOMY LEFT LUMBAR TWO-THREE;  Surgeon: Melina Schools, MD;  Location: [facility];  Service: Orthopedics;  Laterality: N/A;   ORIF WRIST FRACTURE Right    retained hardware   ORTHOPAEDIC SURGERY  04/14/2018   TUBAL LIGATION  12/25/1995     Social History   reports that she has been smoking cigarettes. She has a 15.00 pack-year  smoking history. She has never used smokeless tobacco. She reports current alcohol use. She reports that she does not use drugs.   Family history   Her family history includes Cancer in her mother; Diabetes in her father and mother; Heart disease in her father; Hypertension in her father.   Allergies Allergies  Allergen Reactions   Celebrex [Celecoxib] Swelling    Face swelling   Mobic [Meloxicam] Swelling    Face swelling     Home meds  Prior to Admission medications   Medication Sig Start Date End Date Taking? Authorizing Provider  ALPRAZolam Duanne Moron) 0.5 MG tablet Take 0.5 mg by mouth at bedtime.  08/08/19  Yes [provider]  atorvastatin (LIPITOR) 40 MG tablet Take 40 mg by mouth at bedtime. 05/06/19  Yes [provider]  cyanocobalamin (,VITAMIN B-12,) 1000 MCG/ML injection Inject 1,000 mcg into the muscle every 14 (fourteen) days.   Yes [provider]  FLUoxetine (PROZAC) 40 MG capsule Take 80 mg by mouth daily.  07/11/19  Yes [provider]  folic acid (FOLVITE) 1 MG tablet Take 1 mg by mouth at bedtime.    Yes [provider]  gabapentin (NEURONTIN) 100 MG capsule Take 100-300 mg by mouth See admin instructions. Take 100 mg by mouth in the morning and 300 mg at bedtime 08/24/19  Yes [provider]  insulin aspart (NOVOLOG FLEXPEN) 100 UNIT/ML FlexPen Inject 3-4 Units into the skin 3 (three) times daily as needed (blood sugar above 200).  07/26/18  Yes [provider]  insulin degludec (TRESIBA FLEXTOUCH) 200 UNIT/ML FlexTouch Pen Inject 34 Units into the skin daily.  07/19/18  Yes [provider]  linaclotide (LINZESS) 290 MCG CAPS capsule Take 290 mcg by mouth at bedtime.  07/03/17  Yes [provider]  nitroGLYCERIN (NITROSTAT) 0.4 MG SL tablet Place 1 tablet (0.4 mg total) under the tongue every 5 (five) minutes as needed. 09/13/19 01/09/20 Yes Revankar, Reita Cliche, MD  TRUE METRIX BLOOD GLUCOSE TEST test  strip CHECK BLOOD SUGARS THREE TIMES DAILY 09/30/19  Yes [provider]    Critical care time: CRITICAL CARE Performed by: Lanier Clam   Total critical care time: 35 minutes  Critical care time was exclusive of separately billable procedures and treating other patients.  Critical care was necessary to treat or prevent imminent or life-threatening deterioration.  Critical care was time spent personally by me on the following activities: development of treatment plan with patient and/or surrogate as well as nursing, discussions with consultants, evaluation of patient's response to treatment, examination of patient, obtaining history from patient or  surrogate, ordering and performing treatments and interventions, ordering and review of laboratory studies, ordering and review of radiographic studies, pulse oximetry and re-evaluation of patient's condition.

## 2020-01-15 NOTE — Plan of Care (Signed)
  Problem: Safety: Goal: Ability to remain free from injury will improve Outcome: Progressing   Problem: Education: Goal: Knowledge of General Education information will improve Description: Including pain rating scale, medication(s)/side effects and non-pharmacologic comfort measures Outcome: Progressing   Problem: Health Behavior/Discharge Planning: Goal: Ability to manage health-related needs will improve Outcome: Progressing   Problem: Clinical Measurements: Goal: Respiratory complications will improve Outcome: Progressing   Problem: Activity: Goal: Risk for activity intolerance will decrease Outcome: Progressing   Problem: Nutrition: Goal: Adequate nutrition will be maintained Outcome: Progressing   Problem: Elimination: Goal: Will not experience complications related to bowel motility Outcome: Progressing   Problem: Pain Managment: Goal: General experience of comfort will improve Outcome: Progressing

## 2020-01-15 NOTE — Progress Notes (Signed)
[facility] Progress Note Patient Name: [name] DOB: Jan 24, 1961 MRN: [id number]   Date of Service  01/15/2020  HPI/Events of Note  Hypotension - BP = 83/50 with MAP = 61. On ceiling dose of Norepinephrine via PIV. No CVL or CVP. LVEF = 60-65%. Last Hgb = 11.1.   eICU Interventions  Plan: 1. Bolus with LR 1 liter IV over 1 hour now. 2. H/H STAT. 3. Will notify ground team of need for CVL.     Intervention Category Major Interventions: Hypotension - evaluation and management  Lysle Dingwall 01/15/2020, 5:39 AM

## 2020-01-15 NOTE — Progress Notes (Signed)
Peripherally Inserted Central Catheter Placement  The IV Nurse has discussed with the patient and/or persons authorized to consent for the patient, the purpose of this procedure and the potential benefits and risks involved with this procedure.  The benefits include less needle sticks, lab draws from the catheter, and the patient may be discharged home with the catheter. Risks include, but not limited to, infection, bleeding, blood clot (thrombus formation), and puncture of an artery; nerve damage and irregular heartbeat and possibility to perform a PICC exchange if needed/ordered by physician.  Alternatives to this procedure were also discussed.  Bard Power PICC patient education guide, fact sheet on infection prevention and patient information card has been provided to patient /or left at bedside.    PICC Placement Documentation  PICC Double Lumen 01/15/20 PICC Right Cephalic 34 cm 1 cm (Active)  Indication for Insertion or Continuance of Line Vasoactive infusions 01/15/20 1555  Exposed Catheter (cm) 1 cm 01/15/20 1555  Site Assessment Clean;Dry;Intact 01/15/20 1555  Lumen #1 Status Flushed;Saline locked;Blood return noted 01/15/20 1555  Lumen #2 Status Flushed;Saline locked;Blood return noted 01/15/20 1555  Dressing Type Transparent 01/15/20 1555  Dressing Status Clean;Dry;Intact;Antimicrobial disc in place 01/15/20 1555  Safety Lock Not Applicable 48/01/65 5374  Dressing Intervention New dressing 01/15/20 1555  Dressing Change Due 01/22/20 01/15/20 1555       Gordan Payment 01/15/2020, 3:57 PM

## 2020-01-15 NOTE — Progress Notes (Addendum)
Subjective: 4 Days Post-Op Procedure(s) (LRB): ANTERIOR LUMBAR INTERBODY FUSION  LUMBAR FIVE-SACRAL ONE, LEFT LUMBAR TWO-THREE FORAMINOTOMY, DISECTOMY (N/A) LUMBAR LAMINECTOMY/DECOMPRESSION MICRODISCECTOMY LEFT LUMBAR TWO-THREE (N/A) ABDOMINAL EXPOSURE (N/A) Patient reports pain as mild.   Patient seen in rounds for Dr. Rolena Infante. Patient reports incisional back pain, and some pain with bending She reports decreased leg pain Positive bowel movement, voiding without difficulty She reports some itching and soreness about the abdominal incision, denies abdominal pain otherwise Positive shortness of breath  Objective: Vital signs in last 24 hours: Temp:  [98.3 F (36.8 C)-99.1 F (37.3 C)] 98.3 F (36.8 C) (09/19 0400) Pulse Rate:  [69-83] 78 (09/19 0728) Resp:  [17-32] 21 (09/19 0728) BP: (83-132)/(38-65) 114/52 (09/19 0728) SpO2:  [92 %-100 %] 96 % (09/19 0728) FiO2 (%):  [80 %] 80 % (09/18 0909)  Intake/Output from previous day:  Intake/Output Summary (Last 24 hours) at 01/15/2020 0857 Last data filed at 01/15/2020 0600 Gross per 24 hour  Intake 1221.55 ml  Output 2650 ml  Net -1428.45 ml     Intake/Output this shift: No intake/output data recorded.  Labs: Recent Labs    01/12/20 2254 01/13/20 0827 01/14/20 0356 01/15/20 0659 01/15/20 0806  HGB 11.4* 11.1* 11.1* 11.5* 11.4*   Recent Labs    01/14/20 0356 01/14/20 0356 01/15/20 0659 01/15/20 0806  WBC 13.6*  --   --  13.1*  RBC 3.50*  --   --  3.61*  HCT 34.5*   < > 34.7* 35.0*  PLT 127*  --   --  141*   < > = values in this interval not displayed.   Recent Labs    01/13/20 0233 01/14/20 0356  NA 135 135  K 3.6 3.6  CL 103 102  CO2 26 26  BUN 7 6  CREATININE 0.59 0.61  GLUCOSE 91 93  CALCIUM 8.0* 8.3*   No results for input(s): LABPT, INR in the last 72 hours.  Exam: General - Patient is Alert and Oriented Extremity - Neurologically intact ABD soft Sensation intact distally Intact pulses  distally Dorsiflexion/Plantar flexion intact Dressing - dressing C/D/I, mild blistering at the edge of the abdominal tegaderm Motor Function - intact, moving foot and toes well on exam.   Past Medical History:  Diagnosis Date  . Abnormal thyroid function test 12/06/2015  . Acute bilateral thoracic back pain 12/03/2018  . Alcohol use 04/15/2019   Formatting of this note might be different from the original. Recommend abstinence from alcohol  . Anxiety disorder 06/05/2015   Last Assessment & Plan:  Formatting of this note might be different from the original. Had started her on some xanax about week ago and she feels it has helped her and she is taking about half tablet for this and will continue with it as she has been compliant with it  . Arteriosclerotic vascular disease 11/06/2017  . Arthritis    back   . Atherosclerosis 06/05/2015   Formatting of this note might be different from the original. Seen on CTs of chest.  . Attention deficit 12/04/2017  . Bursitis of both hips 04/14/2018  . CAD (coronary artery disease) 12/24/2017  . Chest pain 11/06/2017  . Chronic bronchitis ([place]) 12/04/2015   Formatting of this note might be different from the original. PFT ratio 88% FEV1 103% FVC 94% DLCO 76%  Last Assessment & Plan:  Formatting of this note might be different from the original. We discussed this as she has been coughing again for about  2 mnths and she sounds congested on exam with previous ease for pneumonia will place her on levaquin and have reviewed with her the importance of her   . Cigarette smoker 02/26/2016  . Coronary arteriosclerosis 12/24/2017  . Coronary artery disease of native artery of native heart with stable angina pectoris ([place]) 01/25/2016   Formatting of this note might be different from the original. 2017:  50% LAD lesion.  Calcium noted other places.  Heart cath December 31, 2017 with a 30% ostial LAD lesion otherwise normal coronary arteries seen with LVEF 55 to 65%  . Degeneration  of lumbar intervertebral disc 11/06/2017  . Diabetes mellitus type 1, uncomplicated ([place]) 25/42/7062   Added automatically from request for surgery [phone]  Formatting of this note might be different from the original. Added automatically from request for surgery [phone]  . Diabetes mellitus without complication (HCC)    Type 1- injections only- recent change to [place] with improved A1C reading.  Marland Kitchen Dysphagia 11/04/2017  . Dyspnea on exertion 12/04/2017  . Elevated LFTs 03/10/2019   Formatting of this note might be different from the original. History.  Cannot change to this.  . Erythrocytosis 12/04/2017  . Fatigue 11/06/2017  . Folic acid deficiency 10/02/3708  . Foot pain 01/28/2018  . Hemangioma 11/06/2017  . History of fracture 11/06/2017  . History of metabolic disorder 09/28/6946  . HNP (herniated nucleus pulposus), lumbar 05/10/2014  . Insulin long-term use ([place]) 11/06/2017  . Intermittent claudication ([place]) 12/04/2017  . Lumbar post-laminectomy syndrome 03/04/2019  . Lumbar radiculopathy 05/26/2017  . Lung mass 12/04/2017  . Malaise and fatigue 11/06/2017  . Menopausal disorder 11/06/2017  . Mixed hyperlipidemia 04/09/2016   Formatting of this note might be different from the original. Added automatically from request for surgery [phone]  . Moderate chronic obstructive pulmonary disease ([place]) 11/06/2017  . Multiple actinic keratoses 11/06/2017  . Myositis 12/04/2017  . Overweight with body mass index (BMI) 25.0-29.9 07/11/2015   Last Assessment & Plan:  Relevant Hx: Course: Daily Update: Today's Plan:difficult for her with the insulin as well as her abilify and the weight is increaseing for her rather than decreasing, she has cut her alcohol intake back, she has worked on her diet more with decreasing her carb intake as well and still difficulty discussed with the saxenda and will try to get this approved for her and see   . Pain of left hip joint 07/23/2018  . Pneumonia   . Polyarthralgia 06/11/2018  .  Primary insomnia 06/05/2015  . Primary osteoarthritis involving multiple joints 08/24/2019  . Purpura ([place]) 11/06/2017  . Respiratory failure ([place]) 11/2018  . Sciatica 11/03/2017  . Scoliosis deformity of spine 11/03/2017  . Secondary diabetes mellitus ([place]) 12/24/2017  . Senile purpura ([place]) 12/04/2017  . Smoking greater than 30 pack years 02/26/2016  . Tobacco abuse 02/11/2016  . Tobacco user 02/11/2016  . Trichotillomania    hx.     Assessment/Plan: 4 Days Post-Op Procedure(s) (LRB): ANTERIOR LUMBAR INTERBODY FUSION  LUMBAR FIVE-SACRAL ONE, LEFT LUMBAR TWO-THREE FORAMINOTOMY, DISECTOMY (N/A) LUMBAR LAMINECTOMY/DECOMPRESSION MICRODISCECTOMY LEFT LUMBAR TWO-THREE (N/A) ABDOMINAL EXPOSURE (N/A) Principal Problem:   Fusion of lumbar spine Active Problems:   Moderate chronic obstructive pulmonary disease (HCC)   Diabetes mellitus type 1, uncomplicated (HCC)   Respiratory failure (HCC)   HAP (hospital-acquired pneumonia)   Fever   Hypotension   Sepsis ([place])  Estimated body mass index is 27.44 kg/m as calculated from the following:   Height as of this  encounter: 5\' 2"  (1.575 m).   Weight as of this encounter: 68 kg.  Patient s/p ALIF L5/S1 with posterior disectomy L2/3 on 01/11/20.  On evening of POD#2 she started having SOB and hypotension Currently in ICU with BiPap to maintain O2 sats. Patient having difficulty breathing and maintaining sats.  Hemoglobin stable at 11.5.   Spoke with the critical care physician this AM at the bedside. He is considering ILD, and planning on initiating steroid treatment. Discussed with Dr. Rolena Infante, who is in agreement.   Will continue to monitor from an orthopaedic standpoint, and available for assistance as needed. Appreciate the help of the critical care team.   Griffith Citron, PA-C Orthopedic Surgery [phone] 01/15/2020, 8:57 AM

## 2020-01-15 NOTE — Progress Notes (Signed)
Physical Therapy Treatment Patient Details Name: [name] MRN: [id number] DOB: 06/22/1960 Today's Date: 01/15/2020    History of Present Illness Pt is a 59 y/o female who presents s/p L5-S1 ALIF and L2-L3 posterior discectomy on 01/11/2020. PMH significant for trichotillomania, DM, scoliosis, respiratory failure, OA, COPD, prior back surgery, dysphagia, CAD, bilateral bursitis of hips, ADD.    PT Comments    Continuing work on functional mobility and activity tolerance; Session today focused on watching activity tolerance closely while getting up and OOB; Ms. Crayton has the physical strength to get to EOB, stand, and pivot with min assist; She is deficient in the functional capacity and endurance to maintian activity very long; She reported lightheadedness in standing - unable to obtain BP standing, but her BP was 103/61 almost immediately upon sitting; will consider orthostatics next session   Follow Up Recommendations  Home health PT;Supervision/Assistance - 24 hour     Equipment Recommendations  Rolling walker with 5" wheels;3in1 (PT)    Recommendations for Other Services OT consult (Energy Conservation)     Precautions / Restrictions Precautions Precautions: Back;Other (comment) Precaution Booklet Issued: Yes (comment) Precaution Comments: reviewed back precautions; Keep close watch of O2 sats Required Braces or Orthoses: Spinal Brace Spinal Brace: Lumbar corset    Mobility  Bed Mobility Overal bed mobility: Needs Assistance Bed Mobility: Rolling;Sidelying to Sit Rolling: Min assist Sidelying to sit: Min assist       General bed mobility comments: Min assist to manage lines and assist in elevating trunk to sit  Transfers Overall transfer level: Needs assistance Equipment used: Rolling walker (2 wheeled) Transfers: Sit to/from Stand Sit to Stand: Min assist         General transfer comment: Min assist t steady  Ambulation/Gait Ambulation/Gait assistance:  Min guard;+2 safety/equipment   Assistive device: Rolling walker (2 wheeled)       General Gait Details: Cues to self-monitor for activity tolerance; some lightheadedness during urpight activity; Desatted to 83% on 15 L supplemental O2 via HFNC   Stairs             Wheelchair Mobility    Modified Rankin (Stroke Patients Only)       Balance                                            Cognition Arousal/Alertness: Awake/alert Behavior During Therapy: WFL for tasks assessed/performed;Anxious Overall Cognitive Status: Within Functional Limits for tasks assessed                                 General Comments: Difficulty breathing lead to incr anxiety; Used calming techniques      Exercises      General Comments General comments (skin integrity, edema, etc.): Session conducted on 15 L supplemental O2, and O2 sats ranged as low as 83% to 92%; BP once in chair initallly 103/61 (was potentially lower than that in standing when she reported lightheadedness); BP 122/59 after a few minutes in the recliner      Pertinent Vitals/Pain Pain Assessment: Faces Faces Pain Scale: Hurts little more Pain Location: Back pain, incision site pain Pain Descriptors / Indicators: Operative site guarding;Grimacing;Discomfort Pain Intervention(s): Monitored during session    Home Living  Prior Function            PT Goals (current goals can now be found in the care plan section) Acute Rehab PT Goals Patient Stated Goal: Be able to breathe PT Goal Formulation: With patient Time For Goal Achievement: 01/19/20 Potential to Achieve Goals: Good Progress towards PT goals: Not progressing toward goals - comment (Limited by hypoxia and soft BPs)    Frequency    Min 5X/week      PT Plan Discharge plan needs to be updated;Equipment recommendations need to be updated    Co-evaluation              AM-PAC PT "6  Clicks" Mobility   Outcome Measure  Help needed turning from your back to your side while in a flat bed without using bedrails?: A Little Help needed moving from lying on your back to sitting on the side of a flat bed without using bedrails?: A Little Help needed moving to and from a bed to a chair (including a wheelchair)?: A Little Help needed standing up from a chair using your arms (e.g., wheelchair or bedside chair)?: A Little Help needed to walk in hospital room?: A Lot Help needed climbing 3-5 steps with a railing? : A Lot 6 Click Score: 16    End of Session Equipment Utilized During Treatment: Gait belt;Back brace Activity Tolerance: Treatment limited secondary to medical complications (Comment) (decr O2 sats) Patient left: in chair;with call bell/phone within reach;with chair alarm set Nurse Communication: Mobility status;Other (comment) PT Visit Diagnosis: Unsteadiness on feet (R26.81);Pain Pain - part of body:  (back, chest)     Time: 0935-1006 PT Time Calculation (min) (ACUTE ONLY): 31 min  Charges:  $Therapeutic Activity: 23-37 mins                     Roney Marion, PT  Acute Rehabilitation Services Pager [phone] Office [place] 01/15/2020, 5:22 PM

## 2020-01-15 NOTE — Progress Notes (Signed)
Paged to place central line, however MAP 70 with bolus infusing on Levophed 31mcg.  Will hold off placing CVC at this time.  Otilio Carpen Roby [place]away, PA-C

## 2020-01-15 NOTE — Progress Notes (Addendum)
Pharmacy Note  Pharmacy verified patient's home Tresiba insulin pen at bedside to be utilized inpatient instead of formulary Lantus per patient request and CCM (Dr. Silas Flood) approval. Patient agreed to decrease her dose from [place] 34 units daily to 20 units daily for now due to hypoglycemic events in the ICU. She states she will not utilize her home Novolog pen stored in her bag and is willing to use [place] only while inpatient due to safety risk. Placed Tyler Aas order for home med and will monitor CBGs and adjust as appropriate. Discussed with CCM and RN.   Arturo Morton, PharmD, BCPS Please check AMION for all [facility] contact numbers Clinical Pharmacist 01/15/2020 10:45 AM

## 2020-01-16 LAB — BASIC METABOLIC PANEL
Anion gap: 7 (ref 5–15)
BUN: 7 mg/dL (ref 6–20)
CO2: 30 mmol/L (ref 22–32)
Calcium: 8.5 mg/dL — ABNORMAL LOW (ref 8.9–10.3)
Chloride: 98 mmol/L (ref 98–111)
Creatinine, Ser: 0.45 mg/dL (ref 0.44–1.00)
GFR calc Af Amer: >60 mL/min (ref >60)
GFR calc non Af Amer: >60 mL/min (ref >60)
Glucose, Bld: 213 mg/dL — ABNORMAL HIGH (ref 70–99)
Potassium: 3.5 mmol/L (ref 3.5–5.1)
Sodium: 135 mmol/L (ref 135–145)

## 2020-01-16 LAB — GLUCOSE, CAPILLARY
Glucose-Capillary: 204 mg/dL — ABNORMAL HIGH (ref 70–99)
Glucose-Capillary: 194 mg/dL — ABNORMAL HIGH (ref 70–99)
Glucose-Capillary: 229 mg/dL — ABNORMAL HIGH (ref 70–99)
Glucose-Capillary: 313 mg/dL — ABNORMAL HIGH (ref 70–99)

## 2020-01-16 LAB — MAGNESIUM: Magnesium: 2.1 mg/dL (ref 1.7–2.4)

## 2020-01-16 MED ORDER — MELATONIN 3 MG PO TABS
3.0000 mg | ORAL_TABLET | Freq: Every evening | ORAL | Status: DC | PRN
  Administered 2020-01-17 – 2020-01-21 (×5): 3 mg via ORAL
  Filled 2020-01-16 (×6): qty 1

## 2020-01-16 NOTE — Progress Notes (Signed)
Pt found to be touching and pressing buttons on IV pump. Educated patient on IV pumps purpose and explained why she should not mess/play with pump and channel. Pt verbalizes understanding. Moved pump and channel out of pt's reach.

## 2020-01-16 NOTE — Progress Notes (Signed)
Patient has not slept in several days, complains of hearing music from the IV pump. Ive informed the patient on many occasions that there is not music coming from the IV pump.  Requesting a sleep aid for the patient.  Patient is now refusing all care and request to speak with a doctor. I had the [facility] provider on the camera, and she refused to speak with him and said she needs to speak with Dr Rolena Infante or [place]. I informed the patient that neither of these providers were available tonight and we would get them for her as soon as possible and offered other support in the meantime. This was also refused. Patients partner to come stay with her at bedside for support.

## 2020-01-16 NOTE — Progress Notes (Signed)
[facility] Progress Note Patient Name: [name] DOB: 1961-02-03 MRN: [id number]   Date of Service  01/16/2020  HPI/Events of Note  Patient requests sleep aid.   eICU Interventions  Will order: 1. Melatonin 3 mg PO Q HS PRN sleep.      Intervention Category Major Interventions: Other:  Lysle Dingwall 01/16/2020, 11:12 PM

## 2020-01-16 NOTE — Evaluation (Signed)
Occupational Therapy Evaluation Patient Details Name: [name] MRN: [id number] DOB: 1960-10-18 Today's Date: 01/16/2020    History of Present Illness Pt is a 59 y/o female who presents s/p L5-S1 ALIF and L2-L3 posterior discectomy on 01/11/2020. PMH significant for trichotillomania, DM, scoliosis, respiratory failure, OA, COPD, prior back surgery, dysphagia, CAD, bilateral bursitis of hips, ADD.   Clinical Impression   This 59 yo female admitted with above on 01/11/2020, was evaled by OT and discharged, but then she developed increased need for O2 and has remained hospitalized so OT was re-consulted. Pt will benefit from acute OT to further ADL education and initiate energy conservation education.    Follow Up Recommendations  No OT follow up;Supervision/Assistance - 24 hour    Equipment Recommendations  3 in 1 bedside commode       Precautions / Restrictions Precautions Precautions: Back Precaution Booklet Issued: Yes (comment) Precaution Comments: able to recall 2/3, reviewed Required Braces or Orthoses: Spinal Brace Spinal Brace: Lumbar corset;Applied in sitting position Restrictions Weight Bearing Restrictions: No      Mobility Bed Mobility Overal bed mobility: Needs Assistance Bed Mobility: Rolling;Sidelying to Sit Rolling: Min guard Sidelying to sit: Min guard       General bed mobility comments: pt sitting EOB with OT upon PT arrival  Transfers Overall transfer level: Needs assistance Equipment used: Rolling walker (2 wheeled) Transfers: Sit to/from Stand Sit to Stand: Min assist         General transfer comment: minA to power up and steady during transition of hands    Balance Overall balance assessment: Mild deficits observed, not formally tested                                         ADL either performed or assessed with clinical judgement   ADL Overall ADL's : Needs assistance/impaired Eating/Feeding: Independent;Sitting    Grooming: Set up;Sitting   Upper Body Bathing: Set up;Sitting Upper Body Bathing Details (indicate cue type and reason): including brace Lower Body Bathing: Minimal assistance;Sit to/from stand   Upper Body Dressing : Set up;Sitting   Lower Body Dressing: Minimal assistance;Sit to/from stand   Toilet Transfer: Minimal assistance;Ambulation;RW Toilet Transfer Details (indicate cue type and reason): O2 15 liters HFNC Toileting- Clothing Manipulation and Hygiene: Minimal assistance;Sitting/lateral lean         General ADL Comments: Educated on purse lipped breathing     Vision Patient Visual Report: No change from baseline              Pertinent Vitals/Pain Pain Assessment: Faces Faces Pain Scale: Hurts little more Pain Location: incisional site Pain Descriptors / Indicators: Operative site guarding Pain Intervention(s): Monitored during session;Repositioned     Hand Dominance  right   Extremity/Trunk Assessment Upper Extremity Assessment Upper Extremity Assessment: Overall WFL for tasks assessed           Communication Communication Communication: No difficulties   Cognition Arousal/Alertness: Awake/alert Behavior During Therapy: Anxious (regarding insulin and chest xray) Overall Cognitive Status: Within Functional Limits for tasks assessed                                     General Comments  Drop in SpO2 to 82% on 15Lo2 via [place]  Living Family/patient expects to be discharged to:: Private residence Living Arrangements: Spouse/significant other Available Help at Discharge: Family;Friend(s);Available 24 hours/day Type of Home: House Home Access: Stairs to enter [employer] of Steps: 6 Entrance Stairs-Rails: Right Home Layout: One level     Bathroom Shower/Tub: Occupational psychologist: Standard     Home Equipment: None   Additional Comments: has access to shower seat and lift chair       Prior Functioning/Environment Level of Independence: Independent                 OT Problem List: Decreased strength;Decreased activity tolerance;Impaired balance (sitting and/or standing);Cardiopulmonary status limiting activity         OT Goals(Current goals can be found in the care plan section) Acute Rehab OT Goals Patient Stated Goal: Be able to breathe better without Oxygen OT Goal Formulation: With patient Time For Goal Achievement: 01/30/20 Potential to Achieve Goals: Good  OT Frequency: Min 2X/week           Co-evaluation PT/OT/SLP Co-Evaluation/Treatment: Yes Reason for Co-Treatment: For patient/therapist safety (pt orthostatic last session) PT goals addressed during session: Mobility/safety with mobility OT goals addressed during session: ADL's and self-care;Strengthening/ROM      AM-PAC OT "6 Clicks" Daily Activity     Outcome Measure Help from another person eating meals?: None Help from another person taking care of personal grooming?: A Little Help from another person toileting, which includes using toliet, bedpan, or urinal?: A Little Help from another person bathing (including washing, rinsing, drying)?: A Little Help from another person to put on and taking off regular upper body clothing?: A Little Help from another person to put on and taking off regular lower body clothing?: A Little 6 Click Score: 19   End of Session Equipment Utilized During Treatment: Gait belt;Rolling walker;Back brace;Oxygen (15 liters HFNC) Nurse Communication: Mobility status (sats dropped to 82% at end of session (15 liters HFNC))  Activity Tolerance: Patient tolerated treatment well Patient left: in chair;with call bell/phone within reach;with chair alarm set  OT Visit Diagnosis: Other abnormalities of gait and mobility (R26.89);Muscle weakness (generalized) (M62.81)                Time: 2876-8115 OT Time Calculation (min): 42 min Charges:  OT General Charges $OT  Visit: 1 Visit OT Evaluation $OT Re-eval: 1 Re-eval OT Treatments $Self Care/Home Management : 8-22 mins  [place], OTR/L Acute [employer] Pager [phone] Office [phone]     Almon Register 01/16/2020, 3:34 PM

## 2020-01-16 NOTE — Progress Notes (Signed)
    Subjective: Procedure(s) (LRB): ANTERIOR LUMBAR INTERBODY FUSION  LUMBAR FIVE-SACRAL ONE, LEFT LUMBAR TWO-THREE FORAMINOTOMY, DISECTOMY (N/A) LUMBAR LAMINECTOMY/DECOMPRESSION MICRODISCECTOMY LEFT LUMBAR TWO-THREE (N/A) ABDOMINAL EXPOSURE (N/A) 5 Days Post-Op  Patient reports pain as 2 on 0-10 scale.  Reports decreased leg pain reports incisional back pain   Positive void Negative bowel movement Positive flatus Negative chest pain.  Positive shortness of breath  Objective: Vital signs in last 24 hours: Temp:  [97.9 F (36.6 C)-99.5 F (37.5 C)] 99.5 F (37.5 C) (09/20 0800) Pulse Rate:  [42-93] 63 (09/20 0645) Resp:  [14-29] 19 (09/20 0645) BP: (79-154)/(37-120) (P) 138/66 (09/20 0800) SpO2:  [67 %-100 %] 96 % (09/20 0645) FiO2 (%):  [80 %] (P) 80 % (09/20 0800)  Intake/Output from previous day: 09/19 0701 - 09/20 0700 In: 808.2 [I.V.:508.1; IV Piggyback:300.1] Out: 2250 [Urine:2250]  Labs: Recent Labs    01/14/20 0356 01/14/20 0356 01/15/20 0659 01/15/20 0806  WBC 13.6*  --   --  13.1*  RBC 3.50*  --   --  3.61*  HCT 34.5*   < > 34.7* 35.0*  PLT 127*  --   --  141*   < > = values in this interval not displayed.   Recent Labs    01/14/20 0356 01/15/20 0806  NA 135 135  K 3.6 4.0  CL 102 97*  CO2 26 27  BUN 6 5*  CREATININE 0.61 0.54  GLUCOSE 93 70  CALCIUM 8.3* 8.3*   No results for input(s): LABPT, INR in the last 72 hours.  Physical Exam: Neurologically intact ABD soft Intact pulses distally Incision: dressing C/D/I Compartment soft Body mass index is 27.44 kg/m.   Assessment/Plan: Patient stable  xrays n/a Continue mobilization with physical therapy  Slight improvement in pulmonary function.  Currently on 15L [place] Patient still SOB but was able to walk in the hall  BP stable - currently only on 1 pressor  Clinical plan per CCM team No active issues as it relates to spine  Melina Schools, MD [facility] [phone]

## 2020-01-16 NOTE — Progress Notes (Signed)
Physical Therapy Treatment Patient Details Name: [name] MRN: [id number] DOB: [birth year] Today's Date: 01/16/2020    History of Present Illness Pt is a 59 y/o female who presents s/p L5-S1 ALIF and L2-L3 posterior discectomy on 01/11/2020. PMH significant for trichotillomania, DM, scoliosis, respiratory failure, OA, COPD, prior back surgery, dysphagia, CAD, bilateral bursitis of hips, ADD.    PT Comments    Pt with improved ambulation tolerance today, denies dizziness, and improved transfer ability. Pt continues to require 15Lo2 via HF [place] and dropped to 83% during ambulation. Pt reports feeling better but still very fatigued. Acute PT to cont to follow.    Follow Up Recommendations  Home health PT;Supervision/Assistance - 24 hour     Equipment Recommendations  Rolling walker with 5" wheels;3in1 (PT)    Recommendations for Other Services OT consult     Precautions / Restrictions Precautions Precautions: Back;Other (comment) Precaution Booklet Issued: Yes (comment) Precaution Comments: able to recall 2/3, reviewed Required Braces or Orthoses: Spinal Brace Spinal Brace: Lumbar corset Restrictions Weight Bearing Restrictions: No    Mobility  Bed Mobility               General bed mobility comments: pt sitting EOB with OT upon PT arrival  Transfers Overall transfer level: Needs assistance Equipment used: Rolling walker (2 wheeled) Transfers: Sit to/from Stand Sit to Stand: Min assist         General transfer comment: minA to power up and steady during transition of hands  Ambulation/Gait Ambulation/Gait assistance: Min assist;+2 safety/equipment Gait Distance (Feet): 70 Feet Assistive device: Rolling walker (2 wheeled) Gait Pattern/deviations: Step-through pattern;Decreased stride length;Drifts right/left;Staggering right Gait velocity: Decreased Gait velocity interpretation: <1.31 ft/sec, indicative of household ambulator General Gait Details: verbal  cues to not pick up walker and to just push, a couple of standing rest breaks, SpO2 dec to 82% on 15Lo2 s/p 70'   Stairs             Wheelchair Mobility    Modified Rankin (Stroke Patients Only)       Balance Overall balance assessment: Mild deficits observed, not formally tested                                          Cognition Arousal/Alertness: Awake/alert Behavior During Therapy: Anxious (regarding insulin shot and xrays) Overall Cognitive Status: Within Functional Limits for tasks assessed                                        Exercises      General Comments General comments (skin integrity, edema, etc.): Drop in SpO2 to 82% on 15Lo2 via [place]      Pertinent Vitals/Pain Pain Assessment: Faces Faces Pain Scale: Hurts little more Pain Location: incisional site Pain Descriptors / Indicators: Operative site guarding Pain Intervention(s): Monitored during session    Home Living                      Prior Function            PT Goals (current goals can now be found in the care plan section) Progress towards PT goals: Progressing toward goals    Frequency    Min 5X/week      PT Plan Current plan remains appropriate  Co-evaluation PT/OT/SLP Co-Evaluation/Treatment: Yes Reason for Co-Treatment: For patient/therapist safety (pt was orthostatic last session) PT goals addressed during session: Mobility/safety with mobility        AM-PAC PT "6 Clicks" Mobility   Outcome Measure  Help needed turning from your back to your side while in a flat bed without using bedrails?: A Little Help needed moving from lying on your back to sitting on the side of a flat bed without using bedrails?: A Little Help needed moving to and from a bed to a chair (including a wheelchair)?: A Little Help needed standing up from a chair using your arms (e.g., wheelchair or bedside chair)?: A Little Help needed to walk in hospital  room?: A Little Help needed climbing 3-5 steps with a railing? : A Little 6 Click Score: 18    End of Session Equipment Utilized During Treatment: Gait belt;Back brace Activity Tolerance: Treatment limited secondary to medical complications (Comment) Patient left: in chair;with call bell/phone within reach;with chair alarm set Nurse Communication: Mobility status PT Visit Diagnosis: Unsteadiness on feet (R26.81);Pain     Time: 1610-9604 PT Time Calculation (min) (ACUTE ONLY): 26 min  Charges:  $Gait Training: 8-22 mins                     [name], PT, DPT Acute Rehabilitation Services Pager #: [phone] Office #: [phone]    [name] 01/16/2020, 1:40 PM

## 2020-01-16 NOTE — Progress Notes (Signed)
RT went in to assess patient and ask if she was ready for BiPAP. SAT was 86% on 8L when I arrived. Stated she wanted to wait until about 11:30pm. Increased HFNC to 10L, SAT 94%. Will reassess later

## 2020-01-16 NOTE — Consult Note (Signed)
NAME:  [name], MRN:  [id number], DOB:  11-30-1960, LOS: 5 ADMISSION DATE:  01/11/2020, CONSULTATION DATE:  01/13/20 REFERRING MD:  Alcario Drought  CHIEF COMPLAINT:  Hypotension   Brief History   [name] is a 59 y.o. female who was admitted 9/15 for lumbar spine fusion.  Overnight 9/16, developed hypotension despite 2L IVF.  CXR with possible PNA.  History of present illness   [name] is a 59 y.o. female who has a PMH as outlined below.  She presented to [facility] 9/15 for anterior lumbar fusion L5 - S1 and posterior L2 - L3 foraminotomy and discectomy.  The case was uncomplicated.  Overnight 9/16, she had hypotension with SBP 80.  She received 2L LR bolus but MAP remained in 50's.  PCCM subsequently asked to see in consultation.  She has had fever to 101 and CXR shows possible PNA.  She required supplemental O2 up to 3L to maintain sats > 90%.  She does endorse mild cough with minimal sputum production along with DOE.  Denies chills/sweats, headaches, N/V/D, abd pain, myalgias.  She states she is prone to PNA and has at least 1 occurrence every year and that this feels similar.  She denies chest pain, light headedness, LE edema, hemoptysis, hx of prior VTE, hx of malignancy.  Past Medical History  has HNP (herniated nucleus pulposus), lumbar; Chest pain; Multiple actinic keratoses; Anxiety disorder; Atherosclerosis; Degeneration of lumbar intervertebral disc; Fatigue; Hemangioma; Primary insomnia; Insulin long-term use ([place]); Menopausal disorder; Purpura ([place]); Trichotillomania; Moderate chronic obstructive pulmonary disease ([place]); Mixed hyperlipidemia; Folic acid deficiency; History of fracture; Malaise and fatigue; Coronary artery disease of native artery of native heart with stable angina pectoris ([place]); Diabetes mellitus type 1, uncomplicated ([place]); Dysphagia; Tobacco abuse; Dyspnea on exertion; Attention deficit; Intermittent claudication ([place]); History of metabolic disorder; Lung mass;  Myositis; Erythrocytosis; Senile purpura ([place]); Overweight with body mass index (BMI) 25.0-29.9; Smoking greater than 30 pack years; Abnormal thyroid function test; Lumbar radiculopathy; Sciatica; Scoliosis deformity of spine; CAD (coronary artery disease); Secondary diabetes mellitus ([place]); Bursitis of both hips; Acute bilateral thoracic back pain; Alcohol use; Chronic bronchitis ([place]); Elevated LFTs; Foot pain; Lumbar post-laminectomy syndrome; Pain of left hip joint; Polyarthralgia; Primary osteoarthritis involving multiple joints; Coronary arteriosclerosis; Arteriosclerotic vascular disease; Cigarette smoker; Tobacco user; Diabetes mellitus due to underlying condition with unspecified complications ([place]); Fusion of lumbar spine; Respiratory failure ([place]); HAP (hospital-acquired pneumonia); Fever; Hypotension; and Sepsis ([place]) on their problem list.  Significant Hospital Events   9/15 > admit. 9/17 > hypotension, transfer to ICU.  Consults:  PCCM, TRH  Procedures:  None.  Significant Diagnostic Tests:  CXR 9/17 > early PNA. CTA chest 9/17 >   Micro Data:  COVID 9/17> neg. Blood 9/17 >  Sputum 9/17 >   Antimicrobials:  Cefepime 9/17 >  Azithro 9/17 > 9/17. Ceftriaxone 9/17 > 9/17.  Interim history/subjective:  Started IV steroids for presumed inflammatory pneumonitis/ILD yesterday. Still on HFNC 100% and BiPAP intermittently. Walked with PT desatted to 83, improved compared to weekend desats to 32s.   Objective:  Blood pressure 123/76, pulse 63, temperature 99.5 F (37.5 C), temperature source Oral, resp. rate 19, height 5\' 2"  (1.575 m), weight 68 kg, last menstrual period 05/04/2008, SpO2 96 %.        Intake/Output Summary (Last 24 hours) at 01/16/2020 1035 Last data filed at 01/16/2020 0500 Gross per 24 hour  Intake 658.54 ml  Output 2250 ml  Net -1591.46 ml  Filed Weights   01/12/20 0800  Weight: 68 kg    Examination: General: Adult female, in NAD. Neuro: A&O x 3,  no deficits. HEENT: [place]/AT. Sclerae anicteric.  EOMI. Cardiovascular: RRR, no M/R/G.  Lungs: Respirations even and unlabored on HFNC. Abdomen: BS x 4, soft, NT/ND.  Musculoskeletal: No gross deformities, no edema.  Skin: Intact, warm, no rashes.  Assessment & Plan:   Hypotension: unclear cause. Possible septic shock due to Pna but procalcitonin undetectable. Inflammatory response to surgery, lung inflammation? Adrenal insufficiency? Has had courses of steroids before --NE MAP > 65 via PICC --Continue cefepime, 5 day course to end today --Steroids as below   Acute hypoxic respiratory failure - presumed 2/2 CAP; differential includes pulmonary edema given central GGOs on CT. Mild improvement with diuresis but still very hypoxemic. Highest suspicion for ILD, has had respiratory failure in the past and gotten steroids. Working to track down records and [employer] from [place]. - BiPAP, HFNC - Continue empiric cefepime. - Methylprednisolone 125 mg IV daily x 3 days (9/19-9/21), plan to follow with 60 mg daily -Hold diuresis for now given stable hypotension, TTE reassuring.  Back pain - s/p anterior lumbar fusion L5 - S1 and posterior L2 - L3 foraminotomy and discectomy. - Post op per ortho.  Hx DM. - Continue SSI and long acting, suspect will have escalating requirements on steroids  Best Practice:  Diet: Reg. Pain/Anxiety/Delirium protocol (if indicated): N/A. VAP protocol (if indicated): N/A. DVT prophylaxis: SCD's / lovenox. GI prophylaxis: N/A. Glucose control: SSI + home long acting Mobility: Bedrest. Code Status: Full. Family Communication: None available. Disposition: ICU.  Labs   CBC: Recent Labs  Lab 01/12/20 2254 01/13/20 0827 01/14/20 0356 01/15/20 0659 01/15/20 0806  WBC 11.6* 10.9* 13.6*  --  13.1*  NEUTROABS 8.3*  --   --   --  11.2*  HGB 11.4* 11.1* 11.1* 11.5* 11.4*  HCT 34.6* 34.9* 34.5* 34.7* 35.0*  MCV 97.2 99.7 98.6  --  97.0  PLT 143* 120* 127*  --   035*   Basic Metabolic Panel: Recent Labs  Lab 01/13/20 0233 01/14/20 0356 01/15/20 0806  NA 135 135 135  K 3.6 3.6 4.0  CL 103 102 97*  CO2 26 26 27   GLUCOSE 91 93 70  BUN 7 6 5*  CREATININE 0.59 0.61 0.54  CALCIUM 8.0* 8.3* 8.3*  MG  --  1.8 1.7  PHOS  --  2.7  --    GFR: Estimated Creatinine Clearance: 67.6 mL/min (by C-G formula based on SCr of 0.54 mg/dL). Recent Labs  Lab 01/12/20 2254 01/13/20 0233 01/13/20 0324 01/13/20 0827 01/14/20 0356 01/14/20 0852 01/15/20 0806  PROCALCITON  --  <0.10  --   --   --   --  2.27  WBC 11.6*  --   --  10.9* 13.6*  --  13.1*  LATICACIDVEN  --   --  1.2 2.2*  --  2.0*  --    Liver Function Tests: Recent Labs  Lab 01/13/20 0233  AST 35  ALT 31  ALKPHOS 68  BILITOT 1.5*  PROT 4.4*  ALBUMIN 2.4*   No results for input(s): LIPASE, AMYLASE in the last 168 hours. No results for input(s): AMMONIA in the last 168 hours. ABG No results found for: PHART, PCO2ART, PO2ART, HCO3, TCO2, ACIDBASEDEF, O2SAT  Coagulation Profile: No results for input(s): INR, PROTIME in the last 168 hours. Cardiac Enzymes: No results for input(s): CKTOTAL, CKMB, CKMBINDEX, TROPONINI in the last 168  hours. HbA1C: Hgb A1c MFr Bld  Date/Time Value Ref Range Status  01/09/2020 09:49 AM 8.1 (H) 4.8 - 5.6 % Final    Comment:    (NOTE) Pre diabetes:          5.7%-6.4%  Diabetes:              >6.4%  Glycemic control for   <7.0% adults with diabetes   04/09/2018 11:51 AM 6.8 (H) 4.8 - 5.6 % Final    Comment:    (NOTE) Pre diabetes:          5.7%-6.4% Diabetes:              >6.4% Glycemic control for   <7.0% adults with diabetes    CBG: Recent Labs  Lab 01/15/20 0815 01/15/20 1139 01/15/20 1641 01/15/20 2110 01/16/20 0829  GLUCAP 76 126* 205* 297* 204*      Past medical history  She,  has a past medical history of Abnormal thyroid function test (12/06/2015), Acute bilateral thoracic back pain (12/03/2018), Alcohol use (04/15/2019),  Anxiety disorder (06/05/2015), Arteriosclerotic vascular disease (11/06/2017), Arthritis, Atherosclerosis (06/05/2015), Attention deficit (12/04/2017), Bursitis of both hips (04/14/2018), CAD (coronary artery disease) (12/24/2017), Chest pain (11/06/2017), Chronic bronchitis ([place]) (12/04/2015), Cigarette smoker (02/26/2016), Coronary arteriosclerosis (12/24/2017), Coronary artery disease of native artery of native heart with stable angina pectoris ([place]) (01/25/2016), Degeneration of lumbar intervertebral disc (11/06/2017), Diabetes mellitus type 1, uncomplicated ([place]) (16/01/9603), Diabetes mellitus without complication ([place]), Dysphagia (11/04/2017), Dyspnea on exertion (12/04/2017), Elevated LFTs (03/10/2019), Erythrocytosis (12/04/2017), Fatigue (5/40/9811), Folic acid deficiency (11/06/2017), Foot pain (01/28/2018), Hemangioma (11/06/2017), History of fracture (11/06/2017), History of metabolic disorder (12/27/4780), HNP (herniated nucleus pulposus), lumbar (05/10/2014), Insulin long-term use ([place]) (11/06/2017), Intermittent claudication ([place]) (12/04/2017), Lumbar post-laminectomy syndrome (03/04/2019), Lumbar radiculopathy (05/26/2017), Lung mass (12/04/2017), Malaise and fatigue (11/06/2017), Menopausal disorder (11/06/2017), Mixed hyperlipidemia (04/09/2016), Moderate chronic obstructive pulmonary disease ([place]) (11/06/2017), Multiple actinic keratoses (11/06/2017), Myositis (12/04/2017), Overweight with body mass index (BMI) 25.0-29.9 (07/11/2015), Pain of left hip joint (07/23/2018), Pneumonia, Polyarthralgia (06/11/2018), Primary insomnia (06/05/2015), Primary osteoarthritis involving multiple joints (08/24/2019), Purpura ([place]) (11/06/2017), Respiratory failure ([place]) (11/2018), Sciatica (11/03/2017), Scoliosis deformity of spine (11/03/2017), Secondary diabetes mellitus ([place]) (12/24/2017), Senile purpura ([place]) (12/04/2017), Smoking greater than 30 pack years (02/26/2016), Tobacco abuse (02/11/2016), Tobacco user (02/11/2016), and Trichotillomania.   Surgical  History    Past Surgical History:  Procedure Laterality Date  . ABDOMINAL EXPOSURE N/A 01/11/2020   Procedure: ABDOMINAL EXPOSURE;  Surgeon: Marty Heck, MD;  Location: [facility];  Service: Vascular;  Laterality: N/A;  . ANTERIOR LUMBAR FUSION N/A 01/11/2020   Procedure: ANTERIOR LUMBAR INTERBODY FUSION  LUMBAR FIVE-SACRAL ONE, LEFT LUMBAR TWO-THREE FORAMINOTOMY, DISECTOMY;  Surgeon: Melina Schools, MD;  Location: [facility];  Service: Orthopedics;  Laterality: N/A;  . APPENDECTOMY  04/28/1970  . BACK SURGERY  2016  . CARDIAC CATHETERIZATION  12/31/2017  . COLONOSCOPY  04/28/2017  . EXCISION/RELEASE BURSA HIP Left 04/14/2018   Procedure: Excision trochanteric bursa left hip;  Surgeon: Susa Day, MD;  Location: [facility];  Service: Orthopedics;  Laterality: Left;  60 mins  . HAND SURGERY    . HIP OPEN REDUCTION Right    ORIF  . KNEE ARTHROSCOPY    . LEFT HEART CATH AND CORONARY ANGIOGRAPHY N/A 12/31/2017   Procedure: LEFT HEART CATH AND CORONARY ANGIOGRAPHY;  Surgeon: Martinique, Peter M, MD;  Location: [facility];  Service: Cardiovascular;  Laterality: N/A;  . LUMBAR LAMINECTOMY/DECOMPRESSION MICRODISCECTOMY Left 05/10/2014   Procedure: MICRODISCECTOMY LUMBAR  DECOMPRESSION L5-S1 LEFT ;  Surgeon: Johnn Hai, MD;  Location: [facility];  Service: Orthopedics;  Laterality: Left;  . LUMBAR LAMINECTOMY/DECOMPRESSION MICRODISCECTOMY N/A 01/11/2020   Procedure: LUMBAR LAMINECTOMY/DECOMPRESSION MICRODISCECTOMY LEFT LUMBAR TWO-THREE;  Surgeon: Melina Schools, MD;  Location: [facility];  Service: Orthopedics;  Laterality: N/A;  . ORIF WRIST FRACTURE Right    retained hardware  . ORTHOPAEDIC SURGERY  04/14/2018  . TUBAL LIGATION  12/25/1995     Social History   reports that she has been smoking cigarettes. She has a 15.00 pack-year smoking history. She has never used smokeless tobacco. She reports current alcohol use. She reports that she does not use drugs.   Family history   Her family history  includes Cancer in her mother; Diabetes in her father and mother; Heart disease in her father; Hypertension in her father.   Allergies Allergies  Allergen Reactions  . Celebrex [Celecoxib] Swelling    Face swelling  . Mobic [Meloxicam] Swelling    Face swelling     Home meds  Prior to Admission medications   Medication Sig Start Date End Date Taking? Authorizing Provider  ALPRAZolam Duanne Moron) 0.5 MG tablet Take 0.5 mg by mouth at bedtime.  08/08/19  Yes [provider]  atorvastatin (LIPITOR) 40 MG tablet Take 40 mg by mouth at bedtime. 05/06/19  Yes [provider]  cyanocobalamin (,VITAMIN B-12,) 1000 MCG/ML injection Inject 1,000 mcg into the muscle every 14 (fourteen) days.   Yes [provider]  FLUoxetine (PROZAC) 40 MG capsule Take 80 mg by mouth daily.  07/11/19  Yes [provider]  folic acid (FOLVITE) 1 MG tablet Take 1 mg by mouth at bedtime.    Yes [provider]  gabapentin (NEURONTIN) 100 MG capsule Take 100-300 mg by mouth See admin instructions. Take 100 mg by mouth in the morning and 300 mg at bedtime 08/24/19  Yes [provider]  insulin aspart (NOVOLOG FLEXPEN) 100 UNIT/ML FlexPen Inject 3-4 Units into the skin 3 (three) times daily as needed (blood sugar above 200).  07/26/18  Yes [provider]  insulin degludec (TRESIBA FLEXTOUCH) 200 UNIT/ML FlexTouch Pen Inject 34 Units into the skin daily.  07/19/18  Yes [provider]  linaclotide (LINZESS) 290 MCG CAPS capsule Take 290 mcg by mouth at bedtime.  07/03/17  Yes [provider]  nitroGLYCERIN (NITROSTAT) 0.4 MG SL tablet Place 1 tablet (0.4 mg total) under the tongue every 5 (five) minutes as needed. 09/13/19 01/09/20 Yes Revankar, Reita Cliche, MD  TRUE METRIX BLOOD GLUCOSE TEST test strip CHECK BLOOD SUGARS THREE TIMES DAILY 09/30/19  Yes [provider]    Critical care time: CRITICAL CARE Performed by: Lanier Clam   Total  critical care time: 32 minutes  Critical care time was exclusive of separately billable procedures and treating other patients.  Critical care was necessary to treat or prevent imminent or life-threatening deterioration.  Critical care was time spent personally by me on the following activities: development of treatment plan with patient and/or surrogate as well as nursing, discussions with consultants, evaluation of patient's response to treatment, examination of patient, obtaining history from patient or surrogate, ordering and performing treatments and interventions, ordering and review of laboratory studies, ordering and review of radiographic studies, pulse oximetry and re-evaluation of patient's condition.

## 2020-01-17 LAB — BASIC METABOLIC PANEL
Anion gap: 6 (ref 5–15)
BUN: 13 mg/dL (ref 6–20)
CO2: 30 mmol/L (ref 22–32)
Calcium: 8.7 mg/dL — ABNORMAL LOW (ref 8.9–10.3)
Chloride: 101 mmol/L (ref 98–111)
Creatinine, Ser: 0.56 mg/dL (ref 0.44–1.00)
GFR calc Af Amer: >60 mL/min (ref >60)
GFR calc non Af Amer: >60 mL/min (ref >60)
Glucose, Bld: 224 mg/dL — ABNORMAL HIGH (ref 70–99)
Potassium: 4.1 mmol/L (ref 3.5–5.1)
Sodium: 137 mmol/L (ref 135–145)

## 2020-01-17 LAB — GLUCOSE, CAPILLARY
Glucose-Capillary: 208 mg/dL — ABNORMAL HIGH (ref 70–99)
Glucose-Capillary: 239 mg/dL — ABNORMAL HIGH (ref 70–99)
Glucose-Capillary: 336 mg/dL — ABNORMAL HIGH (ref 70–99)
Glucose-Capillary: 306 mg/dL — ABNORMAL HIGH (ref 70–99)

## 2020-01-17 LAB — MAGNESIUM: Magnesium: 2.1 mg/dL (ref 1.7–2.4)

## 2020-01-17 MED ORDER — INSULIN DEGLUDEC 200 UNIT/ML ~~LOC~~ SOPN
30.0000 [IU] | PEN_INJECTOR | Freq: Every day | SUBCUTANEOUS | Status: DC
  Administered 2020-01-17 – 2020-01-22 (×6): 30 [IU] via SUBCUTANEOUS
  Administered 2020-01-23: 32 [IU] via SUBCUTANEOUS

## 2020-01-17 MED ORDER — PREDNISONE 20 MG PO TABS
60.0000 mg | ORAL_TABLET | Freq: Every day | ORAL | Status: DC
  Administered 2020-01-18 – 2020-01-21 (×4): 60 mg via ORAL
  Filled 2020-01-17 (×5): qty 3

## 2020-01-17 MED FILL — Sodium Chloride IV Soln 0.9%: INTRAVENOUS | Qty: 1000 | Status: AC

## 2020-01-17 MED FILL — Heparin Sodium (Porcine) Inj 1000 Unit/ML: INTRAMUSCULAR | Qty: 30 | Status: AC

## 2020-01-17 NOTE — Consult Note (Signed)
NAME:  [name], MRN:  [id number], DOB:  Aug 14, 1960, LOS: 6 ADMISSION DATE:  01/11/2020, CONSULTATION DATE:  01/13/20 REFERRING MD:  Alcario Drought  CHIEF COMPLAINT:  Hypotension   Brief History   [name] is a 59 y.o. female who was admitted 9/15 for lumbar spine fusion.  Overnight 9/16, developed hypotension despite 2L IVF.  CXR with possible PNA.  History of present illness   [name] is a 59 y.o. female who has a PMH as outlined below.  She presented to [facility] 9/15 for anterior lumbar fusion L5 - S1 and posterior L2 - L3 foraminotomy and discectomy.  The case was uncomplicated.  Overnight 9/16, she had hypotension with SBP 80.  She received 2L LR bolus but MAP remained in 50's.  PCCM subsequently asked to see in consultation.  She has had fever to 101 and CXR shows possible PNA.  She required supplemental O2 up to 3L to maintain sats > 90%.  She does endorse mild cough with minimal sputum production along with DOE.  Denies chills/sweats, headaches, N/V/D, abd pain, myalgias.  She states she is prone to PNA and has at least 1 occurrence every year and that this feels similar.  She denies chest pain, light headedness, LE edema, hemoptysis, hx of prior VTE, hx of malignancy.  Past Medical History  has HNP (herniated nucleus pulposus), lumbar; Chest pain; Multiple actinic keratoses; Anxiety disorder; Atherosclerosis; Degeneration of lumbar intervertebral disc; Fatigue; Hemangioma; Primary insomnia; Insulin long-term use ([place]); Menopausal disorder; Purpura ([place]); Trichotillomania; Moderate chronic obstructive pulmonary disease ([place]); Mixed hyperlipidemia; Folic acid deficiency; History of fracture; Malaise and fatigue; Coronary artery disease of native artery of native heart with stable angina pectoris ([place]); Diabetes mellitus type 1, uncomplicated ([place]); Dysphagia; Tobacco abuse; Dyspnea on exertion; Attention deficit; Intermittent claudication ([place]); History of metabolic disorder; Lung mass;  Myositis; Erythrocytosis; Senile purpura ([place]); Overweight with body mass index (BMI) 25.0-29.9; Smoking greater than 30 pack years; Abnormal thyroid function test; Lumbar radiculopathy; Sciatica; Scoliosis deformity of spine; CAD (coronary artery disease); Secondary diabetes mellitus ([place]); Bursitis of both hips; Acute bilateral thoracic back pain; Alcohol use; Chronic bronchitis ([place]); Elevated LFTs; Foot pain; Lumbar post-laminectomy syndrome; Pain of left hip joint; Polyarthralgia; Primary osteoarthritis involving multiple joints; Coronary arteriosclerosis; Arteriosclerotic vascular disease; Cigarette smoker; Tobacco user; Diabetes mellitus due to underlying condition with unspecified complications ([place]); Fusion of lumbar spine; Respiratory failure ([place]); HAP (hospital-acquired pneumonia); Fever; Hypotension; and Sepsis ([place]) on their problem list.  Significant Hospital Events   9/15 > admit. 9/17 > hypotension, transfer to ICU.  Consults:  PCCM, TRH  Procedures:  None.  Significant Diagnostic Tests:  CXR 9/17 > early PNA. CTA chest 9/17 >   Micro Data:  COVID 9/17> neg. Blood 9/17 >  Sputum 9/17 >   Antimicrobials:  Cefepime 9/17 >  Azithro 9/17 > 9/17. Ceftriaxone 9/17 > 9/17.  Interim history/subjective:  Mild improvement in O2 yday afternoon from 15L HFNC to 10L HFNC. BiPAP at night. 10L HFNC this AM. Desatted with walking around room this morning. Bumped to 15L, hope to wean again.    Objective:  Blood pressure (!) 91/49, pulse 65, temperature 98.7 F (37.1 C), temperature source Oral, resp. rate 18, height 5\' 2"  (1.575 m), weight 74.5 kg, last menstrual period 05/04/2008, SpO2 97 %.        Intake/Output Summary (Last 24 hours) at 01/17/2020 1329 Last data filed at 01/17/2020 1059 Gross per 24 hour  Intake 1112.57 ml  Output 1300  ml  Net -187.43 ml   Filed Weights   01/12/20 0800 01/16/20 1600  Weight: 68 kg 74.5 kg    Examination: General: Adult female, in  NAD. Neuro: A&O x 3, no deficits. HEENT: [place]/AT. Sclerae anicteric.  EOMI. Cardiovascular: RRR, no M/R/G.  Lungs: Respirations even and unlabored on HFNC. Abdomen: BS x 4, soft, NT/ND.  Musculoskeletal: No gross deformities, no edema.  Skin: Intact, warm, no rashes.  Assessment & Plan:   Hypotension: unclear cause. Possible septic shock due to Pna but procalcitonin undetectable. Inflammatory response to surgery, lung inflammation? Adrenal insufficiency? Has had courses of steroids before --NE MAP > 65 via PICC --Continue cefepime, 5 day course to ended 9/22 --Steroids as below   Acute hypoxic respiratory failure - presumed 2/2 CAP; differential includes pulmonary edema given central GGOs on CT. Mild improvement with diuresis but still very hypoxemic. Highest suspicion for ILD, has had respiratory failure in the past and gotten steroids. Working to track down records and [employer] from [place]. - BiPAP, HFNC - Continue empiric cefepime. - Methylprednisolone 125 mg IV daily x 3 days (9/19-9/21), plan to follow with 60 mg daily -Hold diuresis for now given stable hypotension, TTE reassuring.  Back pain - s/p anterior lumbar fusion L5 - S1 and posterior L2 - L3 foraminotomy and discectomy. - Post op per ortho.  Hx DM. - Continue SSI and long acting, suspect will have escalating requirements on steroids  Best Practice:  Diet: Reg. Pain/Anxiety/Delirium protocol (if indicated): N/A. VAP protocol (if indicated): N/A. DVT prophylaxis: SCD's / lovenox. GI prophylaxis: N/A. Glucose control: SSI + home long acting Mobility: Bedrest. Code Status: Full. Family Communication: None available. Disposition: ICU.  Labs   CBC: Recent Labs  Lab 01/12/20 2254 01/13/20 0827 01/14/20 0356 01/15/20 0659 01/15/20 0806  WBC 11.6* 10.9* 13.6*  --  13.1*  NEUTROABS 8.3*  --   --   --  11.2*  HGB 11.4* 11.1* 11.1* 11.5* 11.4*  HCT 34.6* 34.9* 34.5* 34.7* 35.0*  MCV 97.2 99.7 98.6  --  97.0   PLT 143* 120* 127*  --  347*   Basic Metabolic Panel: Recent Labs  Lab 01/13/20 0233 01/14/20 0356 01/15/20 0806 01/16/20 1054 01/17/20 0645  NA 135 135 135 135 137  K 3.6 3.6 4.0 3.5 4.1  CL 103 102 97* 98 101  CO2 26 26 27 30 30   GLUCOSE 91 93 70 213* 224*  BUN 7 6 5* 7 13  CREATININE 0.59 0.61 0.54 0.45 0.56  CALCIUM 8.0* 8.3* 8.3* 8.5* 8.7*  MG  --  1.8 1.7 2.1 2.1  PHOS  --  2.7  --   --   --    GFR: Estimated Creatinine Clearance: 70.7 mL/min (by C-G formula based on SCr of 0.56 mg/dL). Recent Labs  Lab 01/12/20 2254 01/13/20 0233 01/13/20 0324 01/13/20 0827 01/14/20 0356 01/14/20 0852 01/15/20 0806  PROCALCITON  --  <0.10  --   --   --   --  2.27  WBC 11.6*  --   --  10.9* 13.6*  --  13.1*  LATICACIDVEN  --   --  1.2 2.2*  --  2.0*  --    Liver Function Tests: Recent Labs  Lab 01/13/20 0233  AST 35  ALT 31  ALKPHOS 68  BILITOT 1.5*  PROT 4.4*  ALBUMIN 2.4*   No results for input(s): LIPASE, AMYLASE in the last 168 hours. No results for input(s): AMMONIA in the last 168 hours. ABG  No results found for: PHART, PCO2ART, PO2ART, HCO3, TCO2, ACIDBASEDEF, O2SAT  Coagulation Profile: No results for input(s): INR, PROTIME in the last 168 hours. Cardiac Enzymes: No results for input(s): CKTOTAL, CKMB, CKMBINDEX, TROPONINI in the last 168 hours. HbA1C: Hgb A1c MFr Bld  Date/Time Value Ref Range Status  01/09/2020 09:49 AM 8.1 (H) 4.8 - 5.6 % Final    Comment:    (NOTE) Pre diabetes:          5.7%-6.4%  Diabetes:              >6.4%  Glycemic control for   <7.0% adults with diabetes   04/09/2018 11:51 AM 6.8 (H) 4.8 - 5.6 % Final    Comment:    (NOTE) Pre diabetes:          5.7%-6.4% Diabetes:              >6.4% Glycemic control for   <7.0% adults with diabetes    CBG: Recent Labs  Lab 01/16/20 1204 01/16/20 1712 01/16/20 2133 01/17/20 0812 01/17/20 1219  GLUCAP 194* 229* 313* 208* 239*      Past medical history  She,  has a past  medical history of Abnormal thyroid function test (12/06/2015), Acute bilateral thoracic back pain (12/03/2018), Alcohol use (04/15/2019), Anxiety disorder (06/05/2015), Arteriosclerotic vascular disease (11/06/2017), Arthritis, Atherosclerosis (06/05/2015), Attention deficit (12/04/2017), Bursitis of both hips (04/14/2018), CAD (coronary artery disease) (12/24/2017), Chest pain (11/06/2017), Chronic bronchitis ([place]) (12/04/2015), Cigarette smoker (02/26/2016), Coronary arteriosclerosis (12/24/2017), Coronary artery disease of native artery of native heart with stable angina pectoris ([place]) (01/25/2016), Degeneration of lumbar intervertebral disc (11/06/2017), Diabetes mellitus type 1, uncomplicated ([place]) (16/01/9603), Diabetes mellitus without complication ([place]), Dysphagia (11/04/2017), Dyspnea on exertion (12/04/2017), Elevated LFTs (03/10/2019), Erythrocytosis (12/04/2017), Fatigue (5/40/9811), Folic acid deficiency (11/06/2017), Foot pain (01/28/2018), Hemangioma (11/06/2017), History of fracture (11/06/2017), History of metabolic disorder (12/27/4780), HNP (herniated nucleus pulposus), lumbar (05/10/2014), Insulin long-term use ([place]) (11/06/2017), Intermittent claudication ([place]) (12/04/2017), Lumbar post-laminectomy syndrome (03/04/2019), Lumbar radiculopathy (05/26/2017), Lung mass (12/04/2017), Malaise and fatigue (11/06/2017), Menopausal disorder (11/06/2017), Mixed hyperlipidemia (04/09/2016), Moderate chronic obstructive pulmonary disease ([place]) (11/06/2017), Multiple actinic keratoses (11/06/2017), Myositis (12/04/2017), Overweight with body mass index (BMI) 25.0-29.9 (07/11/2015), Pain of left hip joint (07/23/2018), Pneumonia, Polyarthralgia (06/11/2018), Primary insomnia (06/05/2015), Primary osteoarthritis involving multiple joints (08/24/2019), Purpura ([place]) (11/06/2017), Respiratory failure ([place]) (11/2018), Sciatica (11/03/2017), Scoliosis deformity of spine (11/03/2017), Secondary diabetes mellitus ([place]) (12/24/2017), Senile purpura ([place]) (12/04/2017),  Smoking greater than 30 pack years (02/26/2016), Tobacco abuse (02/11/2016), Tobacco user (02/11/2016), and Trichotillomania.   Surgical History    Past Surgical History:  Procedure Laterality Date  . ABDOMINAL EXPOSURE N/A 01/11/2020   Procedure: ABDOMINAL EXPOSURE;  Surgeon: Marty Heck, MD;  Location: [facility];  Service: Vascular;  Laterality: N/A;  . ANTERIOR LUMBAR FUSION N/A 01/11/2020   Procedure: ANTERIOR LUMBAR INTERBODY FUSION  LUMBAR FIVE-SACRAL ONE, LEFT LUMBAR TWO-THREE FORAMINOTOMY, DISECTOMY;  Surgeon: Melina Schools, MD;  Location: [facility];  Service: Orthopedics;  Laterality: N/A;  . APPENDECTOMY  04/28/1970  . BACK SURGERY  2016  . CARDIAC CATHETERIZATION  12/31/2017  . COLONOSCOPY  04/28/2017  . EXCISION/RELEASE BURSA HIP Left 04/14/2018   Procedure: Excision trochanteric bursa left hip;  Surgeon: Susa Day, MD;  Location: [facility];  Service: Orthopedics;  Laterality: Left;  60 mins  . HAND SURGERY    . HIP OPEN REDUCTION Right    ORIF  . KNEE ARTHROSCOPY    . LEFT HEART CATH AND CORONARY ANGIOGRAPHY N/A 12/31/2017  Procedure: LEFT HEART CATH AND CORONARY ANGIOGRAPHY;  Surgeon: Martinique, Peter M, MD;  Location: [facility];  Service: Cardiovascular;  Laterality: N/A;  . LUMBAR LAMINECTOMY/DECOMPRESSION MICRODISCECTOMY Left 05/10/2014   Procedure: MICRODISCECTOMY LUMBAR DECOMPRESSION L5-S1 LEFT ;  Surgeon: Johnn Hai, MD;  Location: [facility];  Service: Orthopedics;  Laterality: Left;  . LUMBAR LAMINECTOMY/DECOMPRESSION MICRODISCECTOMY N/A 01/11/2020   Procedure: LUMBAR LAMINECTOMY/DECOMPRESSION MICRODISCECTOMY LEFT LUMBAR TWO-THREE;  Surgeon: Melina Schools, MD;  Location: [facility];  Service: Orthopedics;  Laterality: N/A;  . ORIF WRIST FRACTURE Right    retained hardware  . ORTHOPAEDIC SURGERY  04/14/2018  . TUBAL LIGATION  12/25/1995     Social History   reports that she has been smoking cigarettes. She has a 15.00 pack-year smoking history. She has never  used smokeless tobacco. She reports current alcohol use. She reports that she does not use drugs.   Family history   Her family history includes Cancer in her mother; Diabetes in her father and mother; Heart disease in her father; Hypertension in her father.   Allergies Allergies  Allergen Reactions  . Celebrex [Celecoxib] Swelling    Face swelling  . Mobic [Meloxicam] Swelling    Face swelling     Home meds  Prior to Admission medications   Medication Sig Start Date End Date Taking? Authorizing Provider  ALPRAZolam Duanne Moron) 0.5 MG tablet Take 0.5 mg by mouth at bedtime.  08/08/19  Yes [provider]  atorvastatin (LIPITOR) 40 MG tablet Take 40 mg by mouth at bedtime. 05/06/19  Yes [provider]  cyanocobalamin (,VITAMIN B-12,) 1000 MCG/ML injection Inject 1,000 mcg into the muscle every 14 (fourteen) days.   Yes [provider]  FLUoxetine (PROZAC) 40 MG capsule Take 80 mg by mouth daily.  07/11/19  Yes [provider]  folic acid (FOLVITE) 1 MG tablet Take 1 mg by mouth at bedtime.    Yes [provider]  gabapentin (NEURONTIN) 100 MG capsule Take 100-300 mg by mouth See admin instructions. Take 100 mg by mouth in the morning and 300 mg at bedtime 08/24/19  Yes [provider]  insulin aspart (NOVOLOG FLEXPEN) 100 UNIT/ML FlexPen Inject 3-4 Units into the skin 3 (three) times daily as needed (blood sugar above 200).  07/26/18  Yes [provider]  insulin degludec (TRESIBA FLEXTOUCH) 200 UNIT/ML FlexTouch Pen Inject 34 Units into the skin daily.  07/19/18  Yes [provider]  linaclotide (LINZESS) 290 MCG CAPS capsule Take 290 mcg by mouth at bedtime.  07/03/17  Yes [provider]  nitroGLYCERIN (NITROSTAT) 0.4 MG SL tablet Place 1 tablet (0.4 mg total) under the tongue every 5 (five) minutes as needed. 09/13/19 01/09/20 Yes Revankar, Reita Cliche, MD  TRUE METRIX BLOOD GLUCOSE TEST test strip CHECK BLOOD SUGARS THREE  TIMES DAILY 09/30/19  Yes [provider]    Critical care time: CRITICAL CARE Performed by: Bonna Gains Janeth Terry   Total critical care time: 30 minutes  Critical care time was exclusive of separately billable procedures and treating other patients.  Critical care was necessary to treat or prevent imminent or life-threatening deterioration.  Critical care was time spent personally by me on the following activities: development of treatment plan with patient and/or surrogate as well as nursing, discussions with consultants, evaluation of patient's response to treatment, examination of patient, obtaining history from patient or surrogate, ordering and performing treatments and interventions, ordering and review of laboratory studies, ordering and review of radiographic studies,  pulse oximetry and re-evaluation of patient's condition.

## 2020-01-17 NOTE — Progress Notes (Signed)
Physical Therapy Treatment Patient Details Name: [name] MRN: [id number] DOB: July 24, 1960 Today's Date: 01/17/2020    History of Present Illness Pt is a 58 y/o female who presents s/p L5-S1 ALIF and L2-L3 posterior discectomy on 01/11/2020. PMH significant for trichotillomania, DM, scoliosis, respiratory failure, OA, COPD, prior back surgery, dysphagia, CAD, bilateral bursitis of hips, ADD.    PT Comments    Pt and RN with report of restless night with minimal sleep. Pt agreeable to OOB mobility with request to use bathroom. Pt with noted delayed processing and unable to recall back precautions. Pt was able to complete hygiene s/p tolieting with supervision and was able to stand at sink and wash hands without assist. Pt with improved ambulation tolerance and fluidity of gait pattern today. Pt was on 10L02 via HF [place] upon arrival however once pt started moving SpO2 dec into 70s and pt reported SOB. Pt increased to 15Lo2 via HF [place] like yesterday and was able to stay >90% SPo2. Pt also with noted increased bilat LE edema. Acute PT to cont to follow.   Follow Up Recommendations  Home health PT;Supervision/Assistance - 24 hour     Equipment Recommendations  Rolling walker with 5" wheels;3in1 (PT)    Recommendations for Other Services       Precautions / Restrictions Precautions Precautions: Back Precaution Booklet Issued: Yes (comment) Precaution Comments: able to recall 2/3, reviewed, difficulty adhering to no twisting functionally Required Braces or Orthoses: Spinal Brace Spinal Brace: Lumbar corset;Applied in sitting position Restrictions Weight Bearing Restrictions: No    Mobility  Bed Mobility Overal bed mobility: Needs Assistance Bed Mobility: Rolling;Sidelying to Sit Rolling: Min guard Sidelying to sit: Min guard       General bed mobility comments: increased time, no physical assist, HOB elevated  Transfers Overall transfer level: Needs assistance Equipment used:  Rolling walker (2 wheeled) Transfers: Sit to/from Stand Sit to Stand: Min guard         General transfer comment: increased time, min guard for safety, v/c's to minimize trunk flexion  Ambulation/Gait Ambulation/Gait assistance: Min assist;+2 safety/equipment (dtr with chair follow, however didn't need it) Gait Distance (Feet): 120 Feet Assistive device: Rolling walker (2 wheeled) Gait Pattern/deviations: Step-through pattern;Decreased stride length Gait velocity: dec Gait velocity interpretation: <1.31 ft/sec, indicative of household ambulator General Gait Details: pt with improved gait speed and fluidity today. SPO2 90% on 15Lo2 via [place]   Stairs             Wheelchair Mobility    Modified Rankin (Stroke Patients Only)       Balance Overall balance assessment: Mild deficits observed, not formally tested Sitting-balance support: No upper extremity supported;Feet supported Sitting balance-Leahy Scale: Good     Standing balance support: No upper extremity supported;During functional activity Standing balance-Leahy Scale: Fair Standing balance comment: pt stood at sink to wash hands after tolieting                            Cognition Arousal/Alertness: Awake/alert Behavior During Therapy: Anxious Overall Cognitive Status: Within Functional Limits for tasks assessed                                 General Comments: difficulty with remembering precautions, delayed response time      Exercises      General Comments General comments (skin integrity, edema, etc.): Pt on 10LO2  initially however dropped into 70s with movement requiring 15LO2 HF [place]. pt assisted to bathroom      Pertinent Vitals/Pain Pain Assessment: 0-10 Pain Score: 5  Pain Location: incisional site Pain Descriptors / Indicators: Operative site guarding Pain Intervention(s): Monitored during session (pt had ice on)    Home Living                      Prior  Function            PT Goals (current goals can now be found in the care plan section) Progress towards PT goals: Progressing toward goals    Frequency    Min 5X/week      PT Plan Current plan remains appropriate    Co-evaluation              AM-PAC PT "6 Clicks" Mobility   Outcome Measure  Help needed turning from your back to your side while in a flat bed without using bedrails?: A Little Help needed moving from lying on your back to sitting on the side of a flat bed without using bedrails?: A Little Help needed moving to and from a bed to a chair (including a wheelchair)?: A Little Help needed standing up from a chair using your arms (e.g., wheelchair or bedside chair)?: A Little Help needed to walk in hospital room?: A Little Help needed climbing 3-5 steps with a railing? : A Little 6 Click Score: 18    End of Session Equipment Utilized During Treatment: Gait belt;Back brace Activity Tolerance: Patient tolerated treatment well Patient left: in chair;with call bell/phone within reach;with chair alarm set;with family/visitor present Nurse Communication: Mobility status (drop in O2) PT Visit Diagnosis: Unsteadiness on feet (R26.81);Pain     Time: 1007-1046 PT Time Calculation (min) (ACUTE ONLY): 39 min  Charges:  $Gait Training: 8-22 mins $Therapeutic Activity: 23-37 mins                     [name], PT, DPT Acute Rehabilitation Services Pager #: [phone] Office #: [phone]    [name] 01/17/2020, 12:47 PM

## 2020-01-17 NOTE — Progress Notes (Signed)
Called back to patients room this time patient was wanting me to open bathroom door and make sure that it was still there.  I ask her was she wanting to be placed on the BIPAP at this time patient agreed and said it felt better than the nasal cannula.  Patient placed on BIPAP for night rest tolerating well at this time.  RT will continue to monitor pt.

## 2020-01-17 NOTE — Progress Notes (Signed)
Offered patient BIPAP earlier and she refused.  Patient stated that she was hearing music and not feeling comfortable. She said she felt like no one was here.  I reassured patient that there was not any music playing currently there may have been earlier before I entered.  I also reassured patient that our staff would take good care of her and she wasn't alone.

## 2020-01-17 NOTE — Progress Notes (Signed)
Subjective: 6 Days Post-Op Procedure(s) (LRB): ANTERIOR LUMBAR INTERBODY FUSION  LUMBAR FIVE-SACRAL ONE, LEFT LUMBAR TWO-THREE FORAMINOTOMY, DISECTOMY (N/A) LUMBAR LAMINECTOMY/DECOMPRESSION MICRODISCECTOMY LEFT LUMBAR TWO-THREE (N/A) ABDOMINAL EXPOSURE (N/A) Patient reports pain as mild.  Improvement in radicular leg pain.Minimal incisional pain.  +void +flatus +ambulation with PT (desats into 70s) - calf pain +SOB. No CP.   Objective: Vital signs in last 24 hours: Temp:  [97.5 F (36.4 C)-99 F (37.2 C)] 98.7 F (37.1 C) (09/21 1200) Pulse Rate:  [56-149] 65 (09/21 0900) Resp:  [14-30] 18 (09/21 0900) BP: (87-164)/(42-106) 91/49 (09/21 0900) SpO2:  [74 %-100 %] 97 % (09/21 0900) Weight:  [74.5 kg] 74.5 kg (09/20 1600)  Intake/Output from previous day: 09/20 0701 - 09/21 0700 In: 933.4 [P.O.:480; I.V.:153.2; IV Piggyback:300.1] Out: 1300 [Urine:1300] Intake/Output this shift: Total I/O In: 260 [P.O.:240; I.V.:20] Out: -   Recent Labs    01/15/20 0659 01/15/20 0806  HGB 11.5* 11.4*   Recent Labs    01/15/20 0659 01/15/20 0806  WBC  --  13.1*  RBC  --  3.61*  HCT 34.7* 35.0*  PLT  --  141*   Recent Labs    01/16/20 1054 01/17/20 0645  NA 135 137  K 3.5 4.1  CL 98 101  CO2 30 30  BUN 7 13  CREATININE 0.45 0.56  GLUCOSE 213* 224*  CALCIUM 8.5* 8.7*   No results for input(s): LABPT, INR in the last 72 hours.  Neurologically intact ABD soft Neurovascular intact Sensation intact distally Intact pulses distally Dorsiflexion/Plantar flexion intact Incision: no drainage No cellulitis present Compartment soft Gastroc's non-tender, negative Hoffman's bilaterally. Mild LE edema bilaterally   Assessment/Plan: 6 Days Post-Op Procedure(s) (LRB): ANTERIOR LUMBAR INTERBODY FUSION  LUMBAR FIVE-SACRAL ONE, LEFT LUMBAR TWO-THREE FORAMINOTOMY, DISECTOMY (N/A) LUMBAR LAMINECTOMY/DECOMPRESSION MICRODISCECTOMY LEFT LUMBAR TWO-THREE (N/A) ABDOMINAL EXPOSURE  (N/A)  Continue mobilization with PT using LSO brace as long as Sats are reasonable. DVT Ppx: Teds, SCDs, ambulation and lovenox  Appreciate crit care, at this point they can take over care, but Dr. Rolena Infante will continue to check on patient.   [name] [name] 01/17/2020, 1:21 PM

## 2020-01-18 ENCOUNTER — Inpatient Hospital Stay (HOSPITAL_COMMUNITY): Payer: BC Managed Care – PPO

## 2020-01-18 DIAGNOSIS — R0602 Shortness of breath: Secondary | ICD-10-CM

## 2020-01-18 LAB — CULTURE, BLOOD (ROUTINE X 2)
Culture: NO GROWTH
Culture: NO GROWTH
Special Requests: ADEQUATE
Special Requests: ADEQUATE

## 2020-01-18 LAB — BASIC METABOLIC PANEL
Anion gap: 8 (ref 5–15)
BUN: 14 mg/dL (ref 6–20)
CO2: 29 mmol/L (ref 22–32)
Calcium: 8.1 mg/dL — ABNORMAL LOW (ref 8.9–10.3)
Chloride: 101 mmol/L (ref 98–111)
Creatinine, Ser: 0.46 mg/dL (ref 0.44–1.00)
GFR calc Af Amer: >60 mL/min (ref >60)
GFR calc non Af Amer: >60 mL/min (ref >60)
Glucose, Bld: 192 mg/dL — ABNORMAL HIGH (ref 70–99)
Potassium: 3.5 mmol/L (ref 3.5–5.1)
Sodium: 138 mmol/L (ref 135–145)

## 2020-01-18 LAB — GLUCOSE, CAPILLARY
Glucose-Capillary: 156 mg/dL — ABNORMAL HIGH (ref 70–99)
Glucose-Capillary: 155 mg/dL — ABNORMAL HIGH (ref 70–99)
Glucose-Capillary: 231 mg/dL — ABNORMAL HIGH (ref 70–99)
Glucose-Capillary: 223 mg/dL — ABNORMAL HIGH (ref 70–99)

## 2020-01-18 LAB — MAGNESIUM: Magnesium: 1.9 mg/dL (ref 1.7–2.4)

## 2020-01-18 MED ORDER — LIP MEDEX EX OINT
TOPICAL_OINTMENT | CUTANEOUS | Status: DC | PRN
  Filled 2020-01-18: qty 7

## 2020-01-18 MED ORDER — MAGNESIUM CITRATE PO SOLN
0.5000 | Freq: Once | ORAL | Status: AC
  Administered 2020-01-18: 0.5 via ORAL
  Filled 2020-01-18: qty 296

## 2020-01-18 MED ORDER — MORPHINE SULFATE (PF) 2 MG/ML IV SOLN
2.0000 mg | INTRAVENOUS | Status: DC | PRN
  Administered 2020-01-18 – 2020-01-19 (×5): 2 mg via INTRAVENOUS
  Filled 2020-01-18 (×5): qty 1

## 2020-01-18 NOTE — Progress Notes (Addendum)
NAME:  [name], MRN:  [id number], DOB:  1960/08/13, LOS: 7 ADMISSION DATE:  01/11/2020, CONSULTATION DATE:  01/13/20 REFERRING MD:  Alcario Drought  CHIEF COMPLAINT:  Hypotension   Brief History   [name] is a 59 y.o. female who was admitted 9/15 for lumbar spine fusion.  Overnight 9/16, developed hypotension despite 2L IVF.  CXR with possible PNA.  History of present illness   [name] is a 59 y.o. female who has a PMH as outlined below.  She presented to [facility] 9/15 for anterior lumbar fusion L5 - S1 and posterior L2 - L3 foraminotomy and discectomy.  The case was uncomplicated.  Overnight 9/16, she had hypotension with SBP 80.  She received 2L LR bolus but MAP remained in 50's.  PCCM subsequently asked to see in consultation.  She has had fever to 101 and CXR shows possible PNA.  She required supplemental O2 up to 3L to maintain sats > 90%.  She does endorse mild cough with minimal sputum production along with DOE.  Denies chills/sweats, headaches, N/V/D, abd pain, myalgias.  She states she is prone to PNA and has at least 1 occurrence every year and that this feels similar.  She denies chest pain, light headedness, LE edema, hemoptysis, hx of prior VTE, hx of malignancy.  Past Medical History  has HNP (herniated nucleus pulposus), lumbar; Chest pain; Multiple actinic keratoses; Anxiety disorder; Atherosclerosis; Degeneration of lumbar intervertebral disc; Fatigue; Hemangioma; Primary insomnia; Insulin long-term use ([place]); Menopausal disorder; Purpura ([place]); Trichotillomania; Moderate chronic obstructive pulmonary disease ([place]); Mixed hyperlipidemia; Folic acid deficiency; History of fracture; Malaise and fatigue; Coronary artery disease of native artery of native heart with stable angina pectoris ([place]); Diabetes mellitus type 1, uncomplicated ([place]); Dysphagia; Tobacco abuse; Dyspnea on exertion; Attention deficit; Intermittent claudication ([place]); History of metabolic disorder; Lung mass;  Myositis; Erythrocytosis; Senile purpura ([place]); Overweight with body mass index (BMI) 25.0-29.9; Smoking greater than 30 pack years; Abnormal thyroid function test; Lumbar radiculopathy; Sciatica; Scoliosis deformity of spine; CAD (coronary artery disease); Secondary diabetes mellitus ([place]); Bursitis of both hips; Acute bilateral thoracic back pain; Alcohol use; Chronic bronchitis ([place]); Elevated LFTs; Foot pain; Lumbar post-laminectomy syndrome; Pain of left hip joint; Polyarthralgia; Primary osteoarthritis involving multiple joints; Coronary arteriosclerosis; Arteriosclerotic vascular disease; Cigarette smoker; Tobacco user; Diabetes mellitus due to underlying condition with unspecified complications ([place]); Fusion of lumbar spine; Respiratory failure ([place]); HAP (hospital-acquired pneumonia); Fever; Hypotension; and Sepsis ([place]) on their problem list.  Significant Hospital Events   9/15 > admit. 9/17 > hypotension, transfer to ICU.  Consults:  PCCM, TRH  Procedures:  None.  Significant Diagnostic Tests:  CXR 9/17 > early PNA. CTA chest 9/17 >   Micro Data:  COVID 9/17> neg. Blood 9/17 >  Sputum 9/17 >   Antimicrobials:  Cefepime 9/17 >  Azithro 9/17 > 9/17. Ceftriaxone 9/17 > 9/17.  Interim history/subjective:  9/22: still on 10-11L [place] with sats in high 80's low 90's. Dyspneic at rest but states it is secondary to pain and frustration, having "already gone over everything with Dr Rolena Infante". Pt states pain starts in her back. She is still sob with exertion. She is concerned about her slow improvement in resp status.   Objective:  Blood pressure (!) 96/50, pulse 60, temperature 98.5 F (36.9 C), temperature source Oral, resp. rate (!) 29, height 5\' 2"  (1.575 m), weight 74.5 kg, last menstrual period 05/04/2008, SpO2 (!) 72 %.        Intake/Output Summary (  Last 24 hours) at 01/18/2020 0802 Last data filed at 01/17/2020 2300 Gross per 24 hour  Intake 220 ml  Output --  Net 220 ml    Filed Weights   01/12/20 0800 01/16/20 1600  Weight: 68 kg 74.5 kg    Examination: General: Adult female, in NAD but mildly dyspneic with speech Neuro: A&O x 3, no focal deficits HEENT: [place]/AT. Sclerae anicteric.  EOMI.mmmp Cardiovascular: RRR, no M/R/G.  Lungs: dry inspiratory crackles on exam. Respirations even and unlabored on HFNC but mildly fast Abdomen: BS x 4, soft, NT/ND.  Musculoskeletal: No gross deformities, no edema.  Skin: Intact, warm, no rashes.  Assessment & Plan:   Hypotension: unclear cause. Possible septic shock due to Pna but procalcitonin undetectable. Inflammatory response to surgery, lung inflammation? Adrenal insufficiency? Has had courses of steroids before --off pressors.  --cefepime to end today.  --Steroids as below   Acute hypoxic respiratory failure - presumed 2/2 CAP; differential includes pulmonary edema given central GGOs on CT. Mild improvement with diuresis but still very hypoxemic. Highest suspicion for ILD, has had respiratory failure in the past and gotten steroids also post covid January 2021. Working to track down records and images from [place]. - BiPAP prn, HFNC - completed cefepime today - Methylprednisolone 125 mg IV daily x 3 days (9/19-9/21), plan to follow with 60 mg prednisone daily -repeat cxr today.  -cont to wean oxygen for sats >90. Started to prepare her she will most likely be d/cing with supplemental oxygen. -may benefit from HRCT once cleared from pneumonia standpoint.   Back pain - s/p anterior lumbar fusion L5 - S1 and posterior L2 - L3 foraminotomy and discectomy. - Post op per ortho. -they are increasing breakthru  Hx DM. - Continue SSI and long acting, suspect will have escalating requirements on steroids  Best Practice:  Diet: Reg. Pain/Anxiety/Delirium protocol (if indicated): N/A. VAP protocol (if indicated): N/A. DVT prophylaxis: SCD's / lovenox. GI prophylaxis: N/A. Glucose control: SSI + home long  acting Mobility: Bedrest. Code Status: Full. Family Communication: pt only Disposition: ICU or progressive stable. Per primary.   Labs   CBC: Recent Labs  Lab 01/12/20 2254 01/13/20 0827 01/14/20 0356 01/15/20 0659 01/15/20 0806  WBC 11.6* 10.9* 13.6*  --  13.1*  NEUTROABS 8.3*  --   --   --  11.2*  HGB 11.4* 11.1* 11.1* 11.5* 11.4*  HCT 34.6* 34.9* 34.5* 34.7* 35.0*  MCV 97.2 99.7 98.6  --  97.0  PLT 143* 120* 127*  --  998*   Basic Metabolic Panel: Recent Labs  Lab 01/14/20 0356 01/15/20 0806 01/16/20 1054 01/17/20 0645 01/18/20 0500  NA 135 135 135 137 138  K 3.6 4.0 3.5 4.1 3.5  CL 102 97* 98 101 101  CO2 26 27 30 30 29   GLUCOSE 93 70 213* 224* 192*  BUN 6 5* 7 13 14   CREATININE 0.61 0.54 0.45 0.56 0.46  CALCIUM 8.3* 8.3* 8.5* 8.7* 8.1*  MG 1.8 1.7 2.1 2.1 1.9  PHOS 2.7  --   --   --   --    GFR: Estimated Creatinine Clearance: 70.7 mL/min (by C-G formula based on SCr of 0.46 mg/dL). Recent Labs  Lab 01/12/20 2254 01/13/20 0233 01/13/20 0324 01/13/20 0827 01/14/20 0356 01/14/20 0852 01/15/20 0806  PROCALCITON  --  <0.10  --   --   --   --  2.27  WBC 11.6*  --   --  10.9* 13.6*  --  13.1*  LATICACIDVEN  --   --  1.2 2.2*  --  2.0*  --    Liver Function Tests: Recent Labs  Lab 01/13/20 0233  AST 35  ALT 31  ALKPHOS 68  BILITOT 1.5*  PROT 4.4*  ALBUMIN 2.4*   No results for input(s): LIPASE, AMYLASE in the last 168 hours. No results for input(s): AMMONIA in the last 168 hours. ABG No results found for: PHART, PCO2ART, PO2ART, HCO3, TCO2, ACIDBASEDEF, O2SAT  Coagulation Profile: No results for input(s): INR, PROTIME in the last 168 hours. Cardiac Enzymes: No results for input(s): CKTOTAL, CKMB, CKMBINDEX, TROPONINI in the last 168 hours. HbA1C: Hgb A1c MFr Bld  Date/Time Value Ref Range Status  01/09/2020 09:49 AM 8.1 (H) 4.8 - 5.6 % Final    Comment:    (NOTE) Pre diabetes:          5.7%-6.4%  Diabetes:              >6.4%  Glycemic  control for   <7.0% adults with diabetes   04/09/2018 11:51 AM 6.8 (H) 4.8 - 5.6 % Final    Comment:    (NOTE) Pre diabetes:          5.7%-6.4% Diabetes:              >6.4% Glycemic control for   <7.0% adults with diabetes    CBG: Recent Labs  Lab 01/17/20 0812 01/17/20 1219 01/17/20 1615 01/17/20 2108 01/18/20 0735  GLUCAP 208* 239* 336* 306* 156*       Critical care time: CRITICAL CARE Performed by: Audria Nine   Total critical care time: 30 minutes  Critical care time was exclusive of separately billable procedures and treating other patients.  Critical care was necessary to treat or prevent imminent or life-threatening deterioration.  Critical care was time spent personally by me on the following activities: development of treatment plan with patient and/or surrogate as well as nursing, discussions with consultants, evaluation of patient's response to treatment, examination of patient, obtaining history from patient or surrogate, ordering and performing treatments and interventions, ordering and review of laboratory studies, ordering and review of radiographic studies, pulse oximetry and re-evaluation of patient's condition.

## 2020-01-18 NOTE — Progress Notes (Signed)
Physical Therapy Treatment Patient Details Name: [name] MRN: [id number] DOB: 11-08-1960 Today's Date: 01/18/2020    History of Present Illness Pt is a 59 y/o female who presents s/p L5-S1 ALIF and L2-L3 posterior discectomy on 01/11/2020. PMH significant for trichotillomania, DM, scoliosis, respiratory failure, OA, COPD, prior back surgery, dysphagia, CAD, bilateral bursitis of hips, ADD.    PT Comments    Pt with reports "Im not feeling as strong today." Pt was able to amb 120' with RW but at decreased pace compared to yesterday. Pt continues to be unable to recall back precautions. Pt again dropped into 70s on 10Lo2 via HF [place], bumped up to 15Lo2 via HF [place] and pt able to maintain >92%. Pt with noticeable effort on breathing in through nose and out mouth instead of only mouth breathing. Acute PT to cont to follow.    Follow Up Recommendations  Home health PT;Supervision/Assistance - 24 hour     Equipment Recommendations  Rolling walker with 5" wheels;3in1 (PT)    Recommendations for Other Services       Precautions / Restrictions Precautions Precautions: Back Precaution Booklet Issued: Yes (comment) Precaution Comments: continues to need cues to recall, boyfriend present and was able to recall at end of session Required Braces or Orthoses: Spinal Brace Spinal Brace: Lumbar corset Restrictions Weight Bearing Restrictions: No    Mobility  Bed Mobility Overal bed mobility: Needs Assistance Bed Mobility: Rolling;Sidelying to Sit Rolling: Min guard Sidelying to sit: Min guard       General bed mobility comments: increased time, no physical assist, HOB elevated  Transfers Overall transfer level: Needs assistance Equipment used: Rolling walker (2 wheeled) Transfers: Sit to/from Stand Sit to Stand: Min guard         General transfer comment: increased time, min guard for safety, v/c's to minimize trunk flexion  Ambulation/Gait Ambulation/Gait assistance: Min  assist;+2 safety/equipment Gait Distance (Feet): 120 Feet Assistive device: Rolling walker (2 wheeled) Gait Pattern/deviations: Step-through pattern;Decreased stride length Gait velocity: dec Gait velocity interpretation: <1.31 ft/sec, indicative of household ambulator General Gait Details: pt with improved gait speed and fluidity today. SPO2 90% on 15Lo2 via [place]   Stairs             Wheelchair Mobility    Modified Rankin (Stroke Patients Only)       Balance Overall balance assessment: Mild deficits observed, not formally tested Sitting-balance support: No upper extremity supported;Feet supported Sitting balance-Leahy Scale: Good     Standing balance support: No upper extremity supported;During functional activity Standing balance-Leahy Scale: Fair Standing balance comment: pt stood to adjust back brace with both hands without difficulty                            Cognition Arousal/Alertness: Awake/alert Behavior During Therapy: Anxious Overall Cognitive Status: Impaired/Different from baseline Area of Impairment: Memory;Problem solving                     Memory: Decreased recall of precautions       Problem Solving: Slow processing;Decreased initiation;Difficulty sequencing;Requires verbal cues;Requires tactile cues General Comments: difficulty with remembering precautions, delayed response time      Exercises      General Comments General comments (skin integrity, edema, etc.): Pt on 10Lo2 via [place], but once pt started moving dropped into70s requiring increased to 15Lo2 via [place] and was >92% SpO2      Pertinent Vitals/Pain Pain Assessment: 0-10  Pain Score: 5  Pain Location: incisional site Pain Descriptors / Indicators: Operative site guarding    Home Living                      Prior Function            PT Goals (current goals can now be found in the care plan section) Progress towards PT goals: Progressing toward  goals    Frequency    Min 5X/week      PT Plan Current plan remains appropriate    Co-evaluation              AM-PAC PT "6 Clicks" Mobility   Outcome Measure  Help needed turning from your back to your side while in a flat bed without using bedrails?: A Little Help needed moving from lying on your back to sitting on the side of a flat bed without using bedrails?: A Little Help needed moving to and from a bed to a chair (including a wheelchair)?: A Little Help needed standing up from a chair using your arms (e.g., wheelchair or bedside chair)?: A Little Help needed to walk in hospital room?: A Little Help needed climbing 3-5 steps with a railing? : A Little 6 Click Score: 18    End of Session Equipment Utilized During Treatment: Gait belt;Back brace Activity Tolerance: Patient tolerated treatment well Patient left: in chair;with call bell/phone within reach;with chair alarm set;with family/visitor present Nurse Communication: Mobility status PT Visit Diagnosis: Unsteadiness on feet (R26.81);Pain     Time: 1201-1229 PT Time Calculation (min) (ACUTE ONLY): 28 min  Charges:  $Gait Training: 8-22 mins $Therapeutic Activity: 8-22 mins                     [name], PT, DPT Acute Rehabilitation Services Pager #: [phone] Office #: [phone]    [name] 01/18/2020, 2:16 PM

## 2020-01-19 LAB — GLUCOSE, CAPILLARY
Glucose-Capillary: 106 mg/dL — ABNORMAL HIGH (ref 70–99)
Glucose-Capillary: 206 mg/dL — ABNORMAL HIGH (ref 70–99)
Glucose-Capillary: 362 mg/dL — ABNORMAL HIGH (ref 70–99)
Glucose-Capillary: 239 mg/dL — ABNORMAL HIGH (ref 70–99)

## 2020-01-19 LAB — BASIC METABOLIC PANEL
Anion gap: 8 (ref 5–15)
BUN: 12 mg/dL (ref 6–20)
CO2: 31 mmol/L (ref 22–32)
Calcium: 8.5 mg/dL — ABNORMAL LOW (ref 8.9–10.3)
Chloride: 98 mmol/L (ref 98–111)
Creatinine, Ser: 0.5 mg/dL (ref 0.44–1.00)
GFR calc Af Amer: >60 mL/min (ref >60)
GFR calc non Af Amer: >60 mL/min (ref >60)
Glucose, Bld: 141 mg/dL — ABNORMAL HIGH (ref 70–99)
Potassium: 3.8 mmol/L (ref 3.5–5.1)
Sodium: 137 mmol/L (ref 135–145)

## 2020-01-19 LAB — MAGNESIUM: Magnesium: 2.2 mg/dL (ref 1.7–2.4)

## 2020-01-19 MED ORDER — SENNA 8.6 MG PO TABS
1.0000 | ORAL_TABLET | Freq: Two times a day (BID) | ORAL | Status: DC
  Administered 2020-01-19 – 2020-01-23 (×9): 8.6 mg via ORAL
  Filled 2020-01-19 (×9): qty 1

## 2020-01-19 MED ORDER — MIDODRINE HCL 5 MG PO TABS
10.0000 mg | ORAL_TABLET | Freq: Three times a day (TID) | ORAL | Status: DC
  Administered 2020-01-19 – 2020-01-23 (×12): 10 mg via ORAL
  Filled 2020-01-19 (×14): qty 2

## 2020-01-19 MED ORDER — POLYETHYLENE GLYCOL 3350 17 G PO PACK
17.0000 g | PACK | Freq: Every day | ORAL | Status: DC
  Filled 2020-01-19 (×3): qty 1

## 2020-01-19 NOTE — Progress Notes (Signed)
Physical Therapy Treatment Patient Details Name: [name] MRN: [id number] DOB: [birth year] Today's Date: 01/19/2020    History of Present Illness Pt is a 59 y/o female who presents s/p L5-S1 ALIF and L2-L3 posterior discectomy on 01/11/2020. PMH significant for trichotillomania, DM, scoliosis, respiratory failure, OA, COPD, prior back surgery, dysphagia, CAD, bilateral bursitis of hips, ADD.    PT Comments    Pt tolerates ambulation well with increased distances. Pt does continue to demonstrate significant supplemental oxygen needs, requiring 10L HFNC at rest and needing 15L HFNC with activity. PT provides reinforcement of pursed lip breathing technique and use of incentive spirometer to improve pulmonary function. Pt will continue to benefit from PT POC and will benefit from multiple trials of ambulation on the unit each day with assistance of staff. PT continues to recommend HHPT, RW, 3 in 1 commode, and supervision from family.   Follow Up Recommendations  Home health PT;Supervision/Assistance - 24 hour     Equipment Recommendations  Rolling walker with 5" wheels;3in1 (PT)    Recommendations for Other Services       Precautions / Restrictions Precautions Precautions: Back Precaution Booklet Issued: No Precaution Comments: PT provides cues for back precautions intermittently during session Required Braces or Orthoses: Spinal Brace Spinal Brace: Lumbar corset (on upon arrival and left at end of session) Restrictions Weight Bearing Restrictions: No    Mobility  Bed Mobility                  Transfers Overall transfer level: Needs assistance Equipment used: Rolling walker (2 wheeled) Transfers: Sit to/from Stand Sit to Stand: Supervision         General transfer comment: cues for hand placement  Ambulation/Gait Ambulation/Gait assistance: Supervision Gait Distance (Feet): 300 Feet Assistive device: Rolling walker (2 wheeled) Gait Pattern/deviations:  Step-through pattern Gait velocity: reduced Gait velocity interpretation: <1.8 ft/sec, indicate of risk for recurrent falls General Gait Details: pt with slowed but steady step through gait, no significant LOB noted   Stairs             Wheelchair Mobility    Modified Rankin (Stroke Patients Only)       Balance Overall balance assessment: Mild deficits observed, not formally tested Sitting-balance support: No upper extremity supported;Feet supported Sitting balance-Leahy Scale: Good     Standing balance support: Single extremity supported;Bilateral upper extremity supported Standing balance-Leahy Scale: Poor Standing balance comment: reliant on UE support of RW                            Cognition Arousal/Alertness: Awake/alert Behavior During Therapy: WFL for tasks assessed/performed Overall Cognitive Status: Within Functional Limits for tasks assessed                                        Exercises      General Comments General comments (skin integrity, edema, etc.): pt on 10L HFNC at rest, with minimal mobility in recliner pt desats to mid 80s, PT increases pt to 15L HFNC during ambulation with sats ranging from 86-98%. Pt sats increase quickly with PT cues for pursed lip breathing. PT returns pt to 10L HFNC at rest with stable sats      Pertinent Vitals/Pain Pain Assessment: Faces Faces Pain Scale: Hurts little more Pain Location: back Pain Descriptors / Indicators: Grimacing Pain Intervention(s): Monitored during session  Home Living                      Prior Function            PT Goals (current goals can now be found in the care plan section) Acute Rehab PT Goals Patient Stated Goal: Be able to breathe better without Oxygen Time For Goal Achievement: 02/02/20 Progress towards PT goals: Progressing toward goals    Frequency    Min 5X/week      PT Plan Current plan remains appropriate     Co-evaluation              AM-PAC PT "6 Clicks" Mobility   Outcome Measure  Help needed turning from your back to your side while in a flat bed without using bedrails?: A Little Help needed moving from lying on your back to sitting on the side of a flat bed without using bedrails?: A Little Help needed moving to and from a bed to a chair (including a wheelchair)?: None Help needed standing up from a chair using your arms (e.g., wheelchair or bedside chair)?: None Help needed to walk in hospital room?: None Help needed climbing 3-5 steps with a railing? : A Little 6 Click Score: 21    End of Session Equipment Utilized During Treatment: Oxygen Activity Tolerance: Patient tolerated treatment well Patient left: in chair;with call bell/phone within reach;with chair alarm set Nurse Communication: Mobility status PT Visit Diagnosis: Unsteadiness on feet (R26.81);Pain Pain - part of body:  (back)     Time: 5686-1683 PT Time Calculation (min) (ACUTE ONLY): 29 min  Charges:  $Gait Training: 23-37 mins                     Zenaida Niece, PT, DPT Acute Rehabilitation Pager: [phone]    Zenaida Niece 01/19/2020, 1:21 PM

## 2020-01-19 NOTE — Progress Notes (Signed)
NAME:  [name], MRN:  [id number], DOB:  09-06-1960, LOS: 8 ADMISSION DATE:  01/11/2020, CONSULTATION DATE:  01/13/20 REFERRING MD:  Alcario Drought  CHIEF COMPLAINT:  Hypotension   Brief History   [name] is a 59 y.o. female who was admitted 9/15 for lumbar spine fusion.  Overnight 9/16, developed hypotension despite 2L IVF.  CXR with possible PNA.  History of present illness   [name] is a 59 y.o. female who has a PMH as outlined below.  She presented to [facility] 9/15 for anterior lumbar fusion L5 - S1 and posterior L2 - L3 foraminotomy and discectomy.  The case was uncomplicated.  Overnight 9/16, she had hypotension with SBP 80.  She received 2L LR bolus but MAP remained in 50's.  PCCM subsequently asked to see in consultation.  She has had fever to 101 and CXR shows possible PNA.  She required supplemental O2 up to 3L to maintain sats > 90%.  She does endorse mild cough with minimal sputum production along with DOE.  Denies chills/sweats, headaches, N/V/D, abd pain, myalgias.  She states she is prone to PNA and has at least 1 occurrence every year and that this feels similar.  She denies chest pain, light headedness, LE edema, hemoptysis, hx of prior VTE, hx of malignancy.  Past Medical History  has HNP (herniated nucleus pulposus), lumbar; Chest pain; Multiple actinic keratoses; Anxiety disorder; Atherosclerosis; Degeneration of lumbar intervertebral disc; Fatigue; Hemangioma; Primary insomnia; Insulin long-term use ([place]); Menopausal disorder; Purpura ([place]); Trichotillomania; Moderate chronic obstructive pulmonary disease ([place]); Mixed hyperlipidemia; Folic acid deficiency; History of fracture; Malaise and fatigue; Coronary artery disease of native artery of native heart with stable angina pectoris ([place]); Diabetes mellitus type 1, uncomplicated ([place]); Dysphagia; Tobacco abuse; Dyspnea on exertion; Attention deficit; Intermittent claudication ([place]); History of metabolic disorder; Lung mass;  Myositis; Erythrocytosis; Senile purpura ([place]); Overweight with body mass index (BMI) 25.0-29.9; Smoking greater than 30 pack years; Abnormal thyroid function test; Lumbar radiculopathy; Sciatica; Scoliosis deformity of spine; CAD (coronary artery disease); Secondary diabetes mellitus ([place]); Bursitis of both hips; Acute bilateral thoracic back pain; Alcohol use; Chronic bronchitis ([place]); Elevated LFTs; Foot pain; Lumbar post-laminectomy syndrome; Pain of left hip joint; Polyarthralgia; Primary osteoarthritis involving multiple joints; Coronary arteriosclerosis; Arteriosclerotic vascular disease; Cigarette smoker; Tobacco user; Diabetes mellitus due to underlying condition with unspecified complications ([place]); Fusion of lumbar spine; Respiratory failure ([place]); HAP (hospital-acquired pneumonia); Fever; Hypotension; and Sepsis ([place]) on their problem list.  Significant Hospital Events   9/15 > admit. 9/17 > hypotension, transfer to ICU.  Consults:  PCCM, TRH  Procedures:  None.  Significant Diagnostic Tests:  CXR 9/17 > early PNA. CTA chest 9/17 >   Micro Data:  COVID 9/17> neg. Blood 9/17 >  Sputum 9/17 >   Antimicrobials:  Cefepime 9/17 >  Azithro 9/17 > 9/17. Ceftriaxone 9/17 > 9/17.  Interim history/subjective:  9/23: weaned steroids to po. Cont with this for now. Worked with PT yesterday, recommending home health  9/22: still on 10-11L [place] with sats in high 80's low 90's. Dyspneic at rest but states it is secondary to pain and frustration, having "already gone over everything with Dr Rolena Infante". Pt states pain starts in her back. She is still sob with exertion. She is concerned about her slow improvement in resp status.   Objective:  Blood pressure (!) 99/54, pulse 62, temperature 98.7 F (37.1 C), temperature source Oral, resp. rate (!) 21, height 5\' 2"  (1.575 m), weight 74.5  kg, last menstrual period 05/04/2008, SpO2 96 %.        Intake/Output Summary (Last 24 hours) at 01/19/2020  0817 Last data filed at 01/19/2020 0600 Gross per 24 hour  Intake --  Output 550 ml  Net -550 ml   Filed Weights   01/12/20 0800 01/16/20 1600  Weight: 68 kg 74.5 kg    Examination: General: Adult female, in NAD reports more comfortable than yesterday Neuro: A&O x 3, no focal deficits HEENT: [place]/AT. Sclerae anicteric.  EOMI mmmp Cardiovascular: RRR, no M/R/G  Lungs: dry inspiratory crackles on exam. Respirations even and unlabored on HFNC but mildly fast Abdomen: BS x 4, soft, NT/ND.  Musculoskeletal: No gross deformities, no edema.  Skin: Intact, warm, no rashes.  Assessment & Plan:   Hypotension: improved,unclear cause. Possible septic shock due to Pna but procalcitonin undetectable. Inflammatory response to surgery, lung inflammation? Adrenal insufficiency? Has had courses of steroids before --off pressors.  --cefepime completed  --Steroids as below  -start midodrine today  Acute hypoxic respiratory failure - presumed 2/2 CAP; differential includes pulmonary edema given central GGOs on CT. Mild improvement with diuresis but still very hypoxemic. Highest suspicion for ILD, has had respiratory failure in the past and gotten steroids also post covid January 2021. Working to track down records and images from [place]. - BiPAP prn, HFNC - completed cefepime 9/22 - 60 mg prednisone daily (s/p 125mg  daily x 3 days) -cxr with great amount of improvement in bilateral infiltrates, personally reviewed by me  -cont to wean oxygen for sats >90. Started to prepare her she will most likely be d/cing with supplemental oxygen. -may benefit from HRCT once cleared from pneumonia standpoint.   Back pain - s/p anterior lumbar fusion L5 - S1 and posterior L2 - L3 foraminotomy and discectomy. - Post op per ortho.   Hx DM. - Continue SSI and long acting, suspect will have escalating requirements on steroids  Best Practice:  Diet: Reg. Pain/Anxiety/Delirium protocol (if indicated): N/A. VAP  protocol (if indicated): N/A. DVT prophylaxis: SCD's / lovenox. GI prophylaxis: N/A. Glucose control: SSI + home long acting Mobility: Bedrest. Code Status: Full. Family Communication: pt and mother at bedside.  Disposition: ICU or progressive stable. Per primary.   Labs   CBC: Recent Labs  Lab 01/12/20 2254 01/13/20 0827 01/14/20 0356 01/15/20 0659 01/15/20 0806  WBC 11.6* 10.9* 13.6*  --  13.1*  NEUTROABS 8.3*  --   --   --  11.2*  HGB 11.4* 11.1* 11.1* 11.5* 11.4*  HCT 34.6* 34.9* 34.5* 34.7* 35.0*  MCV 97.2 99.7 98.6  --  97.0  PLT 143* 120* 127*  --  161*   Basic Metabolic Panel: Recent Labs  Lab 01/14/20 0356 01/14/20 0356 01/15/20 0806 01/16/20 1054 01/17/20 0645 01/18/20 0500 01/19/20 0500  NA 135   < > 135 135 137 138 137  K 3.6   < > 4.0 3.5 4.1 3.5 3.8  CL 102   < > 97* 98 101 101 98  CO2 26   < > 27 30 30 29 31   GLUCOSE 93   < > 70 213* 224* 192* 141*  BUN 6   < > 5* 7 13 14 12   CREATININE 0.61   < > 0.54 0.45 0.56 0.46 0.50  CALCIUM 8.3*   < > 8.3* 8.5* 8.7* 8.1* 8.5*  MG 1.8   < > 1.7 2.1 2.1 1.9 2.2  PHOS 2.7  --   --   --   --   --   --    < > =  values in this interval not displayed.   GFR: Estimated Creatinine Clearance: 70.7 mL/min (by C-G formula based on SCr of 0.5 mg/dL). Recent Labs  Lab 01/12/20 2254 01/13/20 0233 01/13/20 0324 01/13/20 0827 01/14/20 0356 01/14/20 0852 01/15/20 0806  PROCALCITON  --  <0.10  --   --   --   --  2.27  WBC 11.6*  --   --  10.9* 13.6*  --  13.1*  LATICACIDVEN  --   --  1.2 2.2*  --  2.0*  --    Liver Function Tests: Recent Labs  Lab 01/13/20 0233  AST 35  ALT 31  ALKPHOS 68  BILITOT 1.5*  PROT 4.4*  ALBUMIN 2.4*   No results for input(s): LIPASE, AMYLASE in the last 168 hours. No results for input(s): AMMONIA in the last 168 hours. ABG No results found for: PHART, PCO2ART, PO2ART, HCO3, TCO2, ACIDBASEDEF, O2SAT  Coagulation Profile: No results for input(s): INR, PROTIME in the last 168  hours. Cardiac Enzymes: No results for input(s): CKTOTAL, CKMB, CKMBINDEX, TROPONINI in the last 168 hours. HbA1C: Hgb A1c MFr Bld  Date/Time Value Ref Range Status  01/09/2020 09:49 AM 8.1 (H) 4.8 - 5.6 % Final    Comment:    (NOTE) Pre diabetes:          5.7%-6.4%  Diabetes:              >6.4%  Glycemic control for   <7.0% adults with diabetes   04/09/2018 11:51 AM 6.8 (H) 4.8 - 5.6 % Final    Comment:    (NOTE) Pre diabetes:          5.7%-6.4% Diabetes:              >6.4% Glycemic control for   <7.0% adults with diabetes    CBG: Recent Labs  Lab 01/17/20 2108 01/18/20 0735 01/18/20 1138 01/18/20 1631 01/18/20 2144  GLUCAP 306* 156* 155* 231* 223*       Critical care time: CRITICAL CARE Performed by: Audria Nine   Total critical care time: 31 minutes  Critical care time was exclusive of separately billable procedures and treating other patients.  Critical care was necessary to treat or prevent imminent or life-threatening deterioration.  Critical care was time spent personally by me on the following activities: development of treatment plan with patient and/or surrogate as well as nursing, discussions with consultants, evaluation of patient's response to treatment, examination of patient, obtaining history from patient or surrogate, ordering and performing treatments and interventions, ordering and review of laboratory studies, ordering and review of radiographic studies, pulse oximetry and re-evaluation of patient's condition.

## 2020-01-20 DIAGNOSIS — J9601 Acute respiratory failure with hypoxia: Secondary | ICD-10-CM

## 2020-01-20 LAB — GLUCOSE, CAPILLARY
Glucose-Capillary: 90 mg/dL (ref 70–99)
Glucose-Capillary: 130 mg/dL — ABNORMAL HIGH (ref 70–99)
Glucose-Capillary: 192 mg/dL — ABNORMAL HIGH (ref 70–99)
Glucose-Capillary: 237 mg/dL — ABNORMAL HIGH (ref 70–99)

## 2020-01-20 MED ORDER — INSULIN ASPART 100 UNIT/ML ~~LOC~~ SOLN
0.0000 [IU] | Freq: Every day | SUBCUTANEOUS | Status: DC
  Administered 2020-01-20: 2 [IU] via SUBCUTANEOUS

## 2020-01-20 MED ORDER — LACTATED RINGERS IV BOLUS
250.0000 mL | Freq: Once | INTRAVENOUS | Status: AC
  Administered 2020-01-21: 250 mL via INTRAVENOUS

## 2020-01-20 MED ORDER — IPRATROPIUM-ALBUTEROL 0.5-2.5 (3) MG/3ML IN SOLN: 3.0000 mL | RESPIRATORY_TRACT | Status: DC | PRN

## 2020-01-20 MED ORDER — INSULIN ASPART 100 UNIT/ML ~~LOC~~ SOLN
0.0000 [IU] | Freq: Three times a day (TID) | SUBCUTANEOUS | Status: DC
  Administered 2020-01-20: 3 [IU] via SUBCUTANEOUS
  Administered 2020-01-20: 4 [IU] via SUBCUTANEOUS
  Administered 2020-01-21: 11 [IU] via SUBCUTANEOUS
  Administered 2020-01-21: 4 [IU] via SUBCUTANEOUS
  Administered 2020-01-22: 3 [IU] via SUBCUTANEOUS
  Administered 2020-01-22: 15 [IU] via SUBCUTANEOUS
  Administered 2020-01-23: 3 [IU] via SUBCUTANEOUS

## 2020-01-20 MED ORDER — SALINE SPRAY 0.65 % NA SOLN
1.0000 | NASAL | Status: DC | PRN
  Administered 2020-01-20: 1 via NASAL
  Filled 2020-01-20: qty 44

## 2020-01-20 NOTE — Progress Notes (Signed)
NAME:  [name], MRN:  [id number], DOB:  April 24, 1961, LOS: 9 ADMISSION DATE:  01/11/2020, CONSULTATION DATE:  01/13/20 REFERRING MD:  Alcario Drought  CHIEF COMPLAINT:  Hypotension   Brief History   [name] is a 59 y.o. female who was admitted 9/15 for lumbar spine fusion.  Overnight 9/16, developed hypotension despite 2L IVF.  CXR with possible PNA.  History of present illness   [name] is a 60 y.o. female who has a PMH as outlined below.  She presented to [facility] 9/15 for anterior lumbar fusion L5 - S1 and posterior L2 - L3 foraminotomy and discectomy.  The case was uncomplicated.  Overnight 9/16, she had hypotension with SBP 80.  She received 2L LR bolus but MAP remained in 50's.  PCCM subsequently asked to see in consultation.  She has had fever to 101 and CXR shows possible PNA.  She required supplemental O2 up to 3L to maintain sats > 90%.  She does endorse mild cough with minimal sputum production along with DOE.  Denies chills/sweats, headaches, N/V/D, abd pain, myalgias.  She states she is prone to PNA and has at least 1 occurrence every year and that this feels similar.  She denies chest pain, light headedness, LE edema, hemoptysis, hx of prior VTE, hx of malignancy.  Past Medical History  has HNP (herniated nucleus pulposus), lumbar; Chest pain; Multiple actinic keratoses; Anxiety disorder; Atherosclerosis; Degeneration of lumbar intervertebral disc; Fatigue; Hemangioma; Primary insomnia; Insulin long-term use ([place]); Menopausal disorder; Purpura ([place]); Trichotillomania; Moderate chronic obstructive pulmonary disease ([place]); Mixed hyperlipidemia; Folic acid deficiency; History of fracture; Malaise and fatigue; Coronary artery disease of native artery of native heart with stable angina pectoris ([place]); Diabetes mellitus type 1, uncomplicated ([place]); Dysphagia; Tobacco abuse; Dyspnea on exertion; Attention deficit; Intermittent claudication ([place]); History of metabolic disorder; Lung mass;  Myositis; Erythrocytosis; Senile purpura ([place]); Overweight with body mass index (BMI) 25.0-29.9; Smoking greater than 30 pack years; Abnormal thyroid function test; Lumbar radiculopathy; Sciatica; Scoliosis deformity of spine; CAD (coronary artery disease); Secondary diabetes mellitus ([place]); Bursitis of both hips; Acute bilateral thoracic back pain; Alcohol use; Chronic bronchitis ([place]); Elevated LFTs; Foot pain; Lumbar post-laminectomy syndrome; Pain of left hip joint; Polyarthralgia; Primary osteoarthritis involving multiple joints; Coronary arteriosclerosis; Arteriosclerotic vascular disease; Cigarette smoker; Tobacco user; Diabetes mellitus due to underlying condition with unspecified complications ([place]); Fusion of lumbar spine; Respiratory failure ([place]); HAP (hospital-acquired pneumonia); Fever; Hypotension; and Sepsis ([place]) on their problem list.  Significant Hospital Events   9/15 > admit. 9/17 > hypotension, transfer to ICU.  Consults:  PCCM, TRH  Procedures:  None.  Significant Diagnostic Tests:  CXR 9/17 > early PNA. CTA chest 9/17 > 1. Multifocal pneumonia with lower lobe atelectasis. 2. Negative for pulmonary embolism.  Micro Data:  COVID 9/17> neg. Blood 9/17 > neg   Antimicrobials:  Cefepime 9/17 > 9/21 Azithro 9/17 > 9/17. Ceftriaxone 9/17 > 9/17.  Interim history/subjective:  9/24: pt doing ok but stagnant in clinical improvement. She is attempting to ambulate but desaturates to low 80's high 70's and requires increased supplemental oxygen to do so. Currently on 12L. Hypotension (while improved) preventing any further attempts at diuresis and req midodrine initiation yesterday.  9/23: weaned steroids to po. Cont with this for now. Worked with PT yesterday, recommending home health  9/22: still on 10-11L [place] with sats in high 80's low 90's. Dyspneic at rest but states it is secondary to pain and frustration, having "already gone over everything  with Dr Rolena Infante". Pt states  pain starts in her back. She is still sob with exertion. She is concerned about her slow improvement in resp status.   Objective:  Blood pressure 104/65, pulse 72, temperature 98 F (36.7 C), temperature source Oral, resp. rate (!) 21, height 5\' 2"  (1.575 m), weight 74.5 kg, last menstrual period 05/04/2008, SpO2 (!) 88 %.        Intake/Output Summary (Last 24 hours) at 01/20/2020 0809 Last data filed at 01/19/2020 2000 Gross per 24 hour  Intake 240 ml  Output 400 ml  Net -160 ml   Filed Weights   01/12/20 0800 01/16/20 1600  Weight: 68 kg 74.5 kg    Examination: General: Adult female, in NAD reports feeling improved breathing, sitting up in bed with breakfast Neuro: A&O x 3, no focal deficits HEENT: [place]/AT. Sclerae anicteric.  EOMI mmmp Cardiovascular: RRR, no M/R/G  Lungs: dry inspiratory crackles on exam. Respirations even and unlabored on HFNC Abdomen: BS x 4, soft, NT/ND.  Musculoskeletal: No gross deformities, + b/l LE edema Skin: Intact, warm, no rashes.  Assessment & Plan:   Hypotension: improved,unclear cause. Possible septic shock due to Pna but procalcitonin undetectable. Inflammatory response to surgery, lung inflammation? Adrenal insufficiency? Has had courses of steroids before --off pressors.  --cefepime completed  --Steroids as below  -continue midodrine   Acute hypoxic respiratory failure - presumed 2/2 CAP; differential includes pulmonary edema given central GGOs on CT. Mild improvement with diuresis but still very hypoxemic. Highest suspicion for ILD, has had respiratory failure in the past and gotten steroids also post covid January 2021. Working to track down records and images from [place]. - BiPAP prn, HFNC - completed cefepime 9/22 - 60 mg prednisone daily (s/p 125mg  daily x 3 days) -cxr with great amount of improvement in bilateral infiltrates, personally reviewed by me  -repeat cxr in am -cont to wean oxygen for sats >90. Started to prepare her she  will most likely be d/cing with supplemental oxygen. -may benefit from HRCT once cleared from pneumonia standpoint.  -had pna in 11/2018 with resolved imaging noted on care everywhere, then covid 04/2019 long time smoker until admission  Back pain - s/p anterior lumbar fusion L5 - S1 and posterior L2 - L3 foraminotomy and discectomy. - Post op per ortho. -cont PT  Hx DM. - Continue SSI and long acting, suspect will have escalating requirements on steroids  Best Practice:  Diet: Reg. Pain/Anxiety/Delirium protocol (if indicated): N/A. VAP protocol (if indicated): N/A. DVT prophylaxis: SCD's / lovenox. GI prophylaxis: N/A. Glucose control: SSI + home long acting Mobility: Bedrest. Code Status: Full. Family Communication: pt Disposition: ICU or progressive stable. Per primary.   Labs   CBC: Recent Labs  Lab 01/13/20 0827 01/14/20 0356 01/15/20 0659 01/15/20 0806  WBC 10.9* 13.6*  --  13.1*  NEUTROABS  --   --   --  11.2*  HGB 11.1* 11.1* 11.5* 11.4*  HCT 34.9* 34.5* 34.7* 35.0*  MCV 99.7 98.6  --  97.0  PLT 120* 127*  --  983*   Basic Metabolic Panel: Recent Labs  Lab 01/14/20 0356 01/14/20 0356 01/15/20 0806 01/16/20 1054 01/17/20 0645 01/18/20 0500 01/19/20 0500  NA 135   < > 135 135 137 138 137  K 3.6   < > 4.0 3.5 4.1 3.5 3.8  CL 102   < > 97* 98 101 101 98  CO2 26   < > 27 30 30 29 31   GLUCOSE  93   < > 70 213* 224* 192* 141*  BUN 6   < > 5* 7 13 14 12   CREATININE 0.61   < > 0.54 0.45 0.56 0.46 0.50  CALCIUM 8.3*   < > 8.3* 8.5* 8.7* 8.1* 8.5*  MG 1.8   < > 1.7 2.1 2.1 1.9 2.2  PHOS 2.7  --   --   --   --   --   --    < > = values in this interval not displayed.   GFR: Estimated Creatinine Clearance: 70.7 mL/min (by C-G formula based on SCr of 0.5 mg/dL). Recent Labs  Lab 01/13/20 0827 01/14/20 0356 01/14/20 0852 01/15/20 0806  PROCALCITON  --   --   --  2.27  WBC 10.9* 13.6*  --  13.1*  LATICACIDVEN 2.2*  --  2.0*  --    Liver Function  Tests: No results for input(s): AST, ALT, ALKPHOS, BILITOT, PROT, ALBUMIN in the last 168 hours. No results for input(s): LIPASE, AMYLASE in the last 168 hours. No results for input(s): AMMONIA in the last 168 hours. ABG No results found for: PHART, PCO2ART, PO2ART, HCO3, TCO2, ACIDBASEDEF, O2SAT  Coagulation Profile: No results for input(s): INR, PROTIME in the last 168 hours. Cardiac Enzymes: No results for input(s): CKTOTAL, CKMB, CKMBINDEX, TROPONINI in the last 168 hours. HbA1C: Hgb A1c MFr Bld  Date/Time Value Ref Range Status  01/09/2020 09:49 AM 8.1 (H) 4.8 - 5.6 % Final    Comment:    (NOTE) Pre diabetes:          5.7%-6.4%  Diabetes:              >6.4%  Glycemic control for   <7.0% adults with diabetes   04/09/2018 11:51 AM 6.8 (H) 4.8 - 5.6 % Final    Comment:    (NOTE) Pre diabetes:          5.7%-6.4% Diabetes:              >6.4% Glycemic control for   <7.0% adults with diabetes    CBG: Recent Labs  Lab 01/19/20 0825 01/19/20 1221 01/19/20 1602 01/19/20 2126 01/20/20 0721  GLUCAP 106* 206* 362* 239* 90       Critical care time: CRITICAL CARE Performed by: Audria Nine   Total critical care time: 31 minutes  Critical care time was exclusive of separately billable procedures and treating other patients.  Critical care was necessary to treat or prevent imminent or life-threatening deterioration.  Critical care was time spent personally by me on the following activities: development of treatment plan with patient and/or surrogate as well as nursing, discussions with consultants, evaluation of patient's response to treatment, examination of patient, obtaining history from patient or surrogate, ordering and performing treatments and interventions, ordering and review of laboratory studies, ordering and review of radiographic studies, pulse oximetry and re-evaluation of patient's condition.

## 2020-01-20 NOTE — Progress Notes (Signed)
Inpatient Diabetes Program Recommendations  AACE/ADA: New Consensus Statement on Inpatient Glycemic Control (2015)  Target Ranges:  Prepandial:   less than 140 mg/dL      Peak postprandial:   less than 180 mg/dL (1-2 hours)      Critically ill patients:  140 - 180 mg/dL   Lab Results  Component Value Date   GLUCAP 90 01/20/2020   HGBA1C 8.1 (H) 01/09/2020    Review of Glycemic Control Results for [name], HAMMES (MRN [id number]) as of 01/20/2020 11:48  Ref. Range 01/19/2020 08:25 01/19/2020 12:21 01/19/2020 16:02 01/19/2020 21:26 01/20/2020 07:21  Glucose-Capillary Latest Ref Range: 70 - 99 mg/dL 106 (H) 206 (H) 362 (H) 239 (H) 90    Inpatient Diabetes Program Recommendations:     Postprandials elevated; Novolog 3 units tid with meals if eats at least 50% of meals Novolg 0-15 units tid   Will continue to follow while inpatient.  Thank you, Reche Dixon, RN, BSN Diabetes Coordinator Inpatient Diabetes Program [phone] (team pager from 8a-5p)

## 2020-01-20 NOTE — Progress Notes (Signed)
Physical Therapy Treatment Patient Details Name: [name] MRN: [id number] DOB: Sep 23, 1960 Today's Date: 01/20/2020    History of Present Illness Pt is a 59 y/o female who presents s/p L5-S1 ALIF and L2-L3 posterior discectomy on 01/11/2020. PMH significant for trichotillomania, DM, scoliosis, respiratory failure, OA, COPD, prior back surgery, dysphagia, CAD, bilateral bursitis of hips, ADD.    PT Comments    Pt with improved activity tolerance and pulmonary function this session. Pt with reduced supplemental oxygen needs at this time and reports less dyspnea when mobilizing. Pt will benefit from continued acute PT POC to increase gait speed and to continue progressing activity tolerance and pulmonary function.   Follow Up Recommendations  Home health PT;Supervision/Assistance - 24 hour     Equipment Recommendations  Rolling walker with 5" wheels;3in1 (PT)    Recommendations for Other Services       Precautions / Restrictions Precautions Precautions: Back Precaution Booklet Issued: No Precaution Comments: pt maintains back precautions well, monitor SpO2 Required Braces or Orthoses: Spinal Brace Spinal Brace: Lumbar corset Restrictions Weight Bearing Restrictions: No    Mobility  Bed Mobility                  Transfers Overall transfer level: Needs assistance Equipment used: Rolling walker (2 wheeled) Transfers: Sit to/from Stand Sit to Stand: Supervision            Ambulation/Gait Ambulation/Gait assistance: Supervision Gait Distance (Feet): 300 Feet Assistive device: Rolling walker (2 wheeled) Gait Pattern/deviations: Step-through pattern Gait velocity: reduced Gait velocity interpretation: <1.8 ft/sec, indicate of risk for recurrent falls General Gait Details: pt with slowed step through gait, reduced stride length, PT encourages increased gait speed and lengthened stride   Stairs             Wheelchair Mobility    Modified Rankin  (Stroke Patients Only)       Balance Overall balance assessment: Mild deficits observed, not formally tested                                          Cognition Arousal/Alertness: Awake/alert Behavior During Therapy: WFL for tasks assessed/performed Overall Cognitive Status: Within Functional Limits for tasks assessed                                        Exercises      General Comments General comments (skin integrity, edema, etc.): pt on 8L HFNC at rest with sats in high 90s, pt ambulates on 8L HFNC with sats in high 90s, PT weans pt to 6L HFNC while ambulating and sats range from 88-94%, when desturating to 88% pt able to quickly increase SpO2 back to 92% with standing rest break and deep breathing. Pt left at 6L HFNC at rest with stable sats un upper 90s, RN aware      Pertinent Vitals/Pain Pain Assessment: No/denies pain    Home Living                      Prior Function            PT Goals (current goals can now be found in the care plan section) Acute Rehab PT Goals Patient Stated Goal: Be able to breathe better without Oxygen Progress towards PT goals: Progressing  toward goals    Frequency    Min 5X/week      PT Plan Current plan remains appropriate    Co-evaluation              AM-PAC PT "6 Clicks" Mobility   Outcome Measure  Help needed turning from your back to your side while in a flat bed without using bedrails?: A Little Help needed moving from lying on your back to sitting on the side of a flat bed without using bedrails?: A Little Help needed moving to and from a bed to a chair (including a wheelchair)?: None Help needed standing up from a chair using your arms (e.g., wheelchair or bedside chair)?: None Help needed to walk in hospital room?: None Help needed climbing 3-5 steps with a railing? : A Little 6 Click Score: 21    End of Session Equipment Utilized During Treatment: Oxygen Activity  Tolerance: Patient tolerated treatment well Patient left: in chair;with call bell/phone within reach;with family/visitor present Nurse Communication: Mobility status PT Visit Diagnosis: Unsteadiness on feet (R26.81);Pain     Time: 1541-1610 PT Time Calculation (min) (ACUTE ONLY): 29 min  Charges:  $Gait Training: 23-37 mins                     Zenaida Niece, PT, DPT Acute Rehabilitation Pager: [phone]    Zenaida Niece 01/20/2020, 4:24 PM

## 2020-01-20 NOTE — Progress Notes (Addendum)
eLink Physician-Brief Progress Note Patient Name: [name] DOB: Jun 23, 1960 MRN: [id number]   Date of Service  01/20/2020  HPI/Events of Note  Hypotension.  eICU Interventions  LR 250 ml fluid bolus x 1, stat H & H. Stat lactic acid.        Kerry Kass Traniya Prichett 01/20/2020, 11:41 PM

## 2020-01-20 NOTE — Progress Notes (Signed)
Occupational Therapy Treatment Patient Details Name: [name] MRN: [id number] DOB: 26-Jul-1960 Today's Date: 01/20/2020    History of present illness Pt is a 59 y/o female who presents s/p L5-S1 ALIF and L2-L3 posterior discectomy on 01/11/2020. PMH significant for trichotillomania, DM, scoliosis, respiratory failure, OA, COPD, prior back surgery, dysphagia, CAD, bilateral bursitis of hips, ADD.   OT comments  This 59 yo female doing a bit better overall with her O2 today, but still needing increased O2 for basic ADLs. She is S for ambulation to toilet, able to don her own brace at EOB, able to stand at the sink to wash her hands with S. She will continue to benefit from acute OT with follow up [place].  Follow Up Recommendations  No OT follow up;Supervision/Assistance - 24 hour    Equipment Recommendations  3 in 1 bedside commode       Precautions / Restrictions Precautions Precautions: Back Precaution Booklet Issued: No Precaution Comments: pt maintains back precautions well, monitor SpO2 Required Braces or Orthoses: Spinal Brace Spinal Brace: Lumbar corset;Applied in standing position;Applied in sitting position Restrictions Weight Bearing Restrictions: No       Mobility Bed Mobility Overal bed mobility: Needs Assistance Bed Mobility: Rolling;Sidelying to Sit Rolling: Supervision Sidelying to sit: Supervision          Transfers Overall transfer level: Needs assistance Equipment used: Rolling walker (2 wheeled) Transfers: Sit to/from Stand Sit to Stand: Supervision              Balance Overall balance assessment: Mild deficits observed, not formally tested                                         ADL either performed or assessed with clinical judgement   ADL Overall ADL's : Needs assistance/impaired     Grooming: Supervision/safety;Set up;Standing;Wash/dry hands                   Toilet Transfer: Min guard;Ambulation;RW;Grab  bars;Comfort height toilet   Toileting- Clothing Manipulation and Hygiene: Min guard;Sit to/from stand         General ADL Comments: Pt reported she had gotten a big clot out of her left nostal eariler today and her oxygen was doing better (down to 8 liters), Pt stated she felt like she still has something blocking her left nostral. RN has given her saline nasal spray as I entered  the room. Had pt do several squeezes in both nostrals, then O2 was reapplied due to drop while on RA to mid 80's. Had her do this again in bathroom and then had he blow her nose (2 big dried blood clots came out), O2 turned up to 15 liters to help her get her O2 back up to low 90's after being on RA to take care of her nose. At end of session pt back on 8 liters and regisitering 96%.     Vision Patient Visual Report: No change from baseline            Cognition Arousal/Alertness: Awake/alert Behavior During Therapy: WFL for tasks assessed/performed Overall Cognitive Status: Within Functional Limits for tasks assessed  Pertinent Vitals/ Pain       Pain Assessment: 0-10 Pain Score: 2  Pain Location: back Pain Descriptors / Indicators: Sore Pain Intervention(s): Monitored during session         Frequency  Min 2X/week        Progress Toward Goals  OT Goals(current goals can now be found in the care plan section)  Progress towards OT goals: Progressing toward goals     Plan Discharge plan remains appropriate    Co-evaluation    PT/OT/SLP Co-Evaluation/Treatment: Yes            AM-PAC OT "6 Clicks" Daily Activity     Outcome Measure   Help from another person eating meals?: None Help from another person taking care of personal grooming?: A Little Help from another person toileting, which includes using toliet, bedpan, or urinal?: A Little Help from another person bathing (including washing, rinsing, drying)?: A  Little Help from another person to put on and taking off regular upper body clothing?: A Little Help from another person to put on and taking off regular lower body clothing?: A Little 6 Click Score: 19    End of Session Equipment Utilized During Treatment: Rolling walker;Back brace;Oxygen (8-15 liters HFNC)  OT Visit Diagnosis: Other abnormalities of gait and mobility (R26.89);Muscle weakness (generalized) (M62.81)   Activity Tolerance Patient tolerated treatment well   Patient Left in chair;with call bell/phone within reach;with chair alarm set;with family/visitor present   Nurse Communication  (showed RN blood clots pt blew out of her left nostral)        Time: 1121-6244 OT Time Calculation (min): 43 min  Charges: OT General Charges $OT Visit: 1 Visit OT Treatments $Self Care/Home Management : 38-52 mins  [name], OTR/L Acute [employer] Pager [phone] Office [phone]      [name] 01/20/2020, 10:24 PM

## 2020-01-21 ENCOUNTER — Inpatient Hospital Stay (HOSPITAL_COMMUNITY): Payer: BC Managed Care – PPO

## 2020-01-21 LAB — BASIC METABOLIC PANEL
Anion gap: 10 (ref 5–15)
BUN: 12 mg/dL (ref 6–20)
CO2: 29 mmol/L (ref 22–32)
Calcium: 8.7 mg/dL — ABNORMAL LOW (ref 8.9–10.3)
Chloride: 98 mmol/L (ref 98–111)
Creatinine, Ser: 0.54 mg/dL (ref 0.44–1.00)
GFR calc Af Amer: >60 mL/min (ref >60)
GFR calc non Af Amer: >60 mL/min (ref >60)
Glucose, Bld: 120 mg/dL — ABNORMAL HIGH (ref 70–99)
Potassium: 4 mmol/L (ref 3.5–5.1)
Sodium: 137 mmol/L (ref 135–145)

## 2020-01-21 LAB — CBC WITH DIFFERENTIAL/PLATELET
Abs Immature Granulocytes: 0.15 10*3/uL — ABNORMAL HIGH (ref 0.00–0.07)
Basophils Absolute: 0 10*3/uL (ref 0.0–0.1)
Basophils Relative: 0 %
Eosinophils Absolute: 0.5 10*3/uL (ref 0.0–0.5)
Eosinophils Relative: 5 %
HCT: 29.4 % — ABNORMAL LOW (ref 36.0–46.0)
Hemoglobin: 9.3 g/dL — ABNORMAL LOW (ref 12.0–15.0)
Immature Granulocytes: 2 %
Lymphocytes Relative: 26 %
Lymphs Abs: 2.5 10*3/uL (ref 0.7–4.0)
MCH: 31.2 pg (ref 26.0–34.0)
MCHC: 31.6 g/dL (ref 30.0–36.0)
MCV: 98.7 fL (ref 80.0–100.0)
Monocytes Absolute: 0.6 10*3/uL (ref 0.1–1.0)
Monocytes Relative: 7 %
Neutro Abs: 6 10*3/uL (ref 1.7–7.7)
Neutrophils Relative %: 60 %
Platelets: 249 10*3/uL (ref 150–400)
RBC: 2.98 MIL/uL — ABNORMAL LOW (ref 3.87–5.11)
RDW: 13.7 % (ref 11.5–15.5)
WBC: 9.8 10*3/uL (ref 4.0–10.5)
nRBC: 0 % (ref 0.0–0.2)

## 2020-01-21 LAB — BRAIN NATRIURETIC PEPTIDE: B Natriuretic Peptide: 225.6 pg/mL — ABNORMAL HIGH (ref 0.0–100.0)

## 2020-01-21 LAB — LACTIC ACID, PLASMA
Lactic Acid, Venous: 8.3 mmol/L (ref 0.5–1.9)
Lactic Acid, Venous: 1.1 mmol/L (ref 0.5–1.9)
Lactic Acid, Venous: 1.1 mmol/L (ref 0.5–1.9)

## 2020-01-21 LAB — GLUCOSE, CAPILLARY
Glucose-Capillary: 116 mg/dL — ABNORMAL HIGH (ref 70–99)
Glucose-Capillary: 160 mg/dL — ABNORMAL HIGH (ref 70–99)
Glucose-Capillary: 263 mg/dL — ABNORMAL HIGH (ref 70–99)
Glucose-Capillary: 155 mg/dL — ABNORMAL HIGH (ref 70–99)

## 2020-01-21 LAB — HEMOGLOBIN AND HEMATOCRIT, BLOOD
HCT: 26.1 % — ABNORMAL LOW (ref 36.0–46.0)
Hemoglobin: 8.3 g/dL — ABNORMAL LOW (ref 12.0–15.0)

## 2020-01-21 MED ORDER — LACTATED RINGERS IV BOLUS
1000.0000 mL | Freq: Once | INTRAVENOUS | Status: AC
  Administered 2020-01-21: 1000 mL via INTRAVENOUS

## 2020-01-21 MED ORDER — ALBUMIN HUMAN 5 % IV SOLN
25.0000 g | Freq: Once | INTRAVENOUS | Status: AC
  Administered 2020-01-21: 12.5 g via INTRAVENOUS
  Filled 2020-01-21: qty 500

## 2020-01-21 MED ORDER — PREDNISONE 20 MG PO TABS
40.0000 mg | ORAL_TABLET | Freq: Every day | ORAL | Status: DC
  Administered 2020-01-22 – 2020-01-23 (×2): 40 mg via ORAL
  Filled 2020-01-21 (×2): qty 2

## 2020-01-21 NOTE — Progress Notes (Signed)
NAME:  [name], MRN:  [id number], DOB:  1960-06-10, LOS: 10 ADMISSION DATE:  01/11/2020, CONSULTATION DATE:  01/13/20 REFERRING MD:  Alcario Drought  CHIEF COMPLAINT:  Hypotension   Brief History   [name] is a 59 y.o. female who was admitted 9/15 for lumbar spine fusion.  Overnight 9/16, developed hypotension despite 2L IVF.  CXR with possible PNA.  History of present illness   [name] is a 59 y.o. female who has a PMH as outlined below.  She presented to [facility] 9/15 for anterior lumbar fusion L5 - S1 and posterior L2 - L3 foraminotomy and discectomy.  The case was uncomplicated.  Overnight 9/16, she had hypotension with SBP 80.  She received 2L LR bolus but MAP remained in 50's.  PCCM subsequently asked to see in consultation.  She has had fever to 101 and CXR shows possible PNA.  She required supplemental O2 up to 3L to maintain sats > 90%.  She does endorse mild cough with minimal sputum production along with DOE.  Denies chills/sweats, headaches, N/V/D, abd pain, myalgias.  She states she is prone to PNA and has at least 1 occurrence every year and that this feels similar.  She denies chest pain, light headedness, LE edema, hemoptysis, hx of prior VTE, hx of malignancy.  Past Medical History  has HNP (herniated nucleus pulposus), lumbar; Chest pain; Multiple actinic keratoses; Anxiety disorder; Atherosclerosis; Degeneration of lumbar intervertebral disc; Fatigue; Hemangioma; Primary insomnia; Insulin long-term use ([place]); Menopausal disorder; Purpura ([place]); Trichotillomania; Moderate chronic obstructive pulmonary disease ([place]); Mixed hyperlipidemia; Folic acid deficiency; History of fracture; Malaise and fatigue; Coronary artery disease of native artery of native heart with stable angina pectoris ([place]); Diabetes mellitus type 1, uncomplicated ([place]); Dysphagia; Tobacco abuse; Dyspnea on exertion; Attention deficit; Intermittent claudication ([place]); History of metabolic disorder; Lung mass;  Myositis; Erythrocytosis; Senile purpura ([place]); Overweight with body mass index (BMI) 25.0-29.9; Smoking greater than 30 pack years; Abnormal thyroid function test; Lumbar radiculopathy; Sciatica; Scoliosis deformity of spine; CAD (coronary artery disease); Secondary diabetes mellitus ([place]); Bursitis of both hips; Acute bilateral thoracic back pain; Alcohol use; Chronic bronchitis ([place]); Elevated LFTs; Foot pain; Lumbar post-laminectomy syndrome; Pain of left hip joint; Polyarthralgia; Primary osteoarthritis involving multiple joints; Coronary arteriosclerosis; Arteriosclerotic vascular disease; Cigarette smoker; Tobacco user; Diabetes mellitus due to underlying condition with unspecified complications ([place]); Fusion of lumbar spine; Respiratory failure ([place]); HAP (hospital-acquired pneumonia); Fever; Hypotension; and Sepsis ([place]) on their problem list.  Significant Hospital Events   9/15 > admit. 9/17 > hypotension, transfer to ICU.  Consults:  PCCM, TRH  Procedures:  None.  Significant Diagnostic Tests:  CXR 9/17 > early PNA. CTA chest 9/17 > 1. Multifocal pneumonia with lower lobe atelectasis. 2. Negative for pulmonary embolism.  Micro Data:  COVID 9/17> neg. Blood 9/17 > neg   Antimicrobials:  Cefepime 9/17 > 9/21 Azithro 9/17 > 9/17. Ceftriaxone 9/17 > 9/17.  Interim history/subjective:  9/25: Patient on about 5 L of oxygen.  Feeling a little bit better.  Ready to get active today 9/24: pt doing ok but stagnant in clinical improvement. She is attempting to ambulate but desaturates to low 80's high 70's and requires increased supplemental oxygen to do so. Currently on 12L. Hypotension (while improved) preventing any further attempts at diuresis and req midodrine initiation yesterday.  9/23: weaned steroids to po. Cont with this for now. Worked with PT yesterday, recommending home health  9/22: still on 10-11L [place] with sats in high  80's low 90's. Dyspneic at rest but states it is  secondary to pain and frustration, having "already gone over everything with Dr Rolena Infante". Pt states pain starts in her back. She is still sob with exertion. She is concerned about her slow improvement in resp status.   Objective:  Blood pressure (!) 106/46, pulse 75, temperature 97.6 F (36.4 C), temperature source Oral, resp. rate (!) 22, height 5\' 2"  (1.575 m), weight 74.5 kg, last menstrual period 05/04/2008, SpO2 (!) 88 %.        Intake/Output Summary (Last 24 hours) at 01/21/2020 0942 Last data filed at 01/21/2020 0500 Gross per 24 hour  Intake 1218.92 ml  Output --  Net 1218.92 ml   Filed Weights   01/12/20 0800 01/16/20 1600  Weight: 68 kg 74.5 kg    Examination: General: Middle-age, does not appear to be in distress, no use of accessory muscles of respirations  Neuro: Alert and oriented x3 with no focal deficits HEENT: Anicteric, moist oral mucosa Cardiovascular: S1-S2 appreciated Lungs: Clear mostly, decreased at the bases Abdomen: Bowel sounds appreciated, soft nontender Musculoskeletal: No gross deformities, + b/l LE edema Skin: Intact, warm, no rashes.  Assessment & Plan:  Hypotension is better -Off pressors -Completed antibiotics -On steroids -Still requiring midodrine  Acute hypoxic respiratory failure -Presumed secondary to pneumonia -Completed antibiotics -Oxygen requirement improving -Currently on prednisone, decreased to 40 mg daily -Chest x-ray is stable -Continue oxygen supplementation to keep saturations greater than 90  Back pain -S/p anterior lumbar fusion L5-S1 and posterior L2-L3 foraminotomy and discectomy -Orthopedics continues to follow  History of diabetes -SSI  Aggressive physical therapy as tolerated  Elevated lactic acid of 8.3 -Abdomen is soft, nontender, bowel sounds appreciated -Constipated-on MiraLAX, senna started on 23rd, likely related to opiates for back pain -No evidence of acute abdomen -Fluid resuscitate and  repeat -Echocardiogram with normal ejection fraction  Best Practice:  Diet: Reg. Pain/Anxiety/Delirium protocol (if indicated): N/A. VAP protocol (if indicated): N/A. DVT prophylaxis: SCD's / lovenox. GI prophylaxis: N/A. Glucose control: SSI + home long acting Mobility: Bedrest. Code Status: Full. Family Communication: pt Disposition: ICU or progressive stable. Per primary.   Labs   CBC: Recent Labs  Lab 01/15/20 0659 01/15/20 0806 01/20/20 2344 01/21/20 0450  WBC  --  13.1*  --  9.8  NEUTROABS  --  11.2*  --  6.0  HGB 11.5* 11.4* 8.3* 9.3*  HCT 34.7* 35.0* 26.1* 29.4*  MCV  --  97.0  --  98.7  PLT  --  141*  --  096   Basic Metabolic Panel: Recent Labs  Lab 01/15/20 0806 01/15/20 0806 01/16/20 1054 01/17/20 0645 01/18/20 0500 01/19/20 0500 01/21/20 0450  NA 135   < > 135 137 138 137 137  K 4.0   < > 3.5 4.1 3.5 3.8 4.0  CL 97*   < > 98 101 101 98 98  CO2 27   < > 30 30 29 31 29   GLUCOSE 70   < > 213* 224* 192* 141* 120*  BUN 5*   < > 7 13 14 12 12   CREATININE 0.54   < > 0.45 0.56 0.46 0.50 0.54  CALCIUM 8.3*   < > 8.5* 8.7* 8.1* 8.5* 8.7*  MG 1.7  --  2.1 2.1 1.9 2.2  --    < > = values in this interval not displayed.   GFR: Estimated Creatinine Clearance: 70.7 mL/min (by C-G formula based on SCr of 0.54 mg/dL). Recent  Labs  Lab 01/15/20 0806 01/20/20 2346 01/21/20 0450  PROCALCITON 2.27  --   --   WBC 13.1*  --  9.8  LATICACIDVEN  --  8.3*  --    Liver Function Tests: No results for input(s): AST, ALT, ALKPHOS, BILITOT, PROT, ALBUMIN in the last 168 hours. No results for input(s): LIPASE, AMYLASE in the last 168 hours. No results for input(s): AMMONIA in the last 168 hours. ABG No results found for: PHART, PCO2ART, PO2ART, HCO3, TCO2, ACIDBASEDEF, O2SAT  Coagulation Profile: No results for input(s): INR, PROTIME in the last 168 hours. Cardiac Enzymes: No results for input(s): CKTOTAL, CKMB, CKMBINDEX, TROPONINI in the last 168  hours. HbA1C: Hgb A1c MFr Bld  Date/Time Value Ref Range Status  01/09/2020 09:49 AM 8.1 (H) 4.8 - 5.6 % Final    Comment:    (NOTE) Pre diabetes:          5.7%-6.4%  Diabetes:              >6.4%  Glycemic control for   <7.0% adults with diabetes   04/09/2018 11:51 AM 6.8 (H) 4.8 - 5.6 % Final    Comment:    (NOTE) Pre diabetes:          5.7%-6.4% Diabetes:              >6.4% Glycemic control for   <7.0% adults with diabetes    CBG: Recent Labs  Lab 01/20/20 0721 01/20/20 1213 01/20/20 1625 01/20/20 2208 01/21/20 0714  GLUCAP 90 130* 192* 237* 116*   The patient is critically ill with multiple organ systems failure and requires high complexity decision making for assessment and support, frequent evaluation and titration of therapies, application of advanced monitoring technologies and extensive interpretation of multiple databases. Critical Care Time devoted to patient care services described in this note independent of APP/resident time (if applicable)  is 35 minutes.   Sherrilyn Rist MD [facility] Personal pager: [phone] If unanswered, please page CCM On-call: [phone]

## 2020-01-21 NOTE — Progress Notes (Addendum)
[facility] Progress Note Patient Name: KENDAHL BUMGARDNER DOB: 1960-09-09 MRN: [id number]   Date of Service  01/21/2020  HPI/Events of Note  Patient with hypotension and a serum lactic acid of 8.3.  eICU Interventions  Patient not appropriate for transfer out of ICU. Albumin 5 % 500 ml fluid bolus x 1, BNP level. Physical exam to r/o acute abdomen.        Kerry Kass Samone Guhl 01/21/2020, 1:51 AM

## 2020-01-22 LAB — GLUCOSE, CAPILLARY
Glucose-Capillary: 100 mg/dL — ABNORMAL HIGH (ref 70–99)
Glucose-Capillary: 146 mg/dL — ABNORMAL HIGH (ref 70–99)
Glucose-Capillary: 306 mg/dL — ABNORMAL HIGH (ref 70–99)
Glucose-Capillary: 151 mg/dL — ABNORMAL HIGH (ref 70–99)

## 2020-01-22 NOTE — Progress Notes (Signed)
NAME:  [name], MRN:  [id number], DOB:  March 04, 1961, LOS: 11 ADMISSION DATE:  01/11/2020, CONSULTATION DATE:  01/13/20 REFERRING MD:  Alcario Drought  CHIEF COMPLAINT:  Hypotension   Brief History   [name] is a 59 y.o. female who was admitted 9/15 for lumbar spine fusion.  Overnight 9/16, developed hypotension despite 2L IVF.  CXR with possible PNA.  History of present illness   [name] is a 59 y.o. female who has a PMH as outlined below.  She presented to [facility] 9/15 for anterior lumbar fusion L5 - S1 and posterior L2 - L3 foraminotomy and discectomy.  The case was uncomplicated.  Overnight 9/16, she had hypotension with SBP 80.  She received 2L LR bolus but MAP remained in 50's.  PCCM subsequently asked to see in consultation.  She has had fever to 101 and CXR shows possible PNA.  She required supplemental O2 up to 3L to maintain sats > 90%.  She does endorse mild cough with minimal sputum production along with DOE.  Denies chills/sweats, headaches, N/V/D, abd pain, myalgias.  She states she is prone to PNA and has at least 1 occurrence every year and that this feels similar.  She denies chest pain, light headedness, LE edema, hemoptysis, hx of prior VTE, hx of malignancy.  Past Medical History  has HNP (herniated nucleus pulposus), lumbar; Chest pain; Multiple actinic keratoses; Anxiety disorder; Atherosclerosis; Degeneration of lumbar intervertebral disc; Fatigue; Hemangioma; Primary insomnia; Insulin long-term use ([place]); Menopausal disorder; Purpura ([place]); Trichotillomania; Moderate chronic obstructive pulmonary disease ([place]); Mixed hyperlipidemia; Folic acid deficiency; History of fracture; Malaise and fatigue; Coronary artery disease of native artery of native heart with stable angina pectoris ([place]); Diabetes mellitus type 1, uncomplicated ([place]); Dysphagia; Tobacco abuse; Dyspnea on exertion; Attention deficit; Intermittent claudication ([place]); History of metabolic disorder; Lung mass;  Myositis; Erythrocytosis; Senile purpura ([place]); Overweight with body mass index (BMI) 25.0-29.9; Smoking greater than 30 pack years; Abnormal thyroid function test; Lumbar radiculopathy; Sciatica; Scoliosis deformity of spine; CAD (coronary artery disease); Secondary diabetes mellitus ([place]); Bursitis of both hips; Acute bilateral thoracic back pain; Alcohol use; Chronic bronchitis ([place]); Elevated LFTs; Foot pain; Lumbar post-laminectomy syndrome; Pain of left hip joint; Polyarthralgia; Primary osteoarthritis involving multiple joints; Coronary arteriosclerosis; Arteriosclerotic vascular disease; Cigarette smoker; Tobacco user; Diabetes mellitus due to underlying condition with unspecified complications ([place]); Fusion of lumbar spine; Respiratory failure ([place]); HAP (hospital-acquired pneumonia); Fever; Hypotension; and Sepsis ([place]) on their problem list.  Significant Hospital Events   9/15 > admit. 9/17 > hypotension, transfer to ICU.  Consults:  PCCM, TRH  Procedures:  None.  Significant Diagnostic Tests:  CXR 9/17 > early PNA. CTA chest 9/17 > 1. Multifocal pneumonia with lower lobe atelectasis. 2. Negative for pulmonary embolism.  Micro Data:  COVID 9/17> neg. Blood 9/17 > neg   Antimicrobials:  Cefepime 9/17 > 9/21 Azithro 9/17 > 9/17. Ceftriaxone 9/17 > 9/17.  Interim history/subjective:  9/25: Patient on about 5 L of oxygen.  Feeling a little bit better.  Ready to get active today 9/24: pt doing ok but stagnant in clinical improvement. She is attempting to ambulate but desaturates to low 80's high 70's and requires increased supplemental oxygen to do so. Currently on 12L. Hypotension (while improved) preventing any further attempts at diuresis and req midodrine initiation yesterday.  9/23: weaned steroids to po. Cont with this for now. Worked with PT yesterday, recommending home health  9/22: still on 10-11L [place] with sats in high  80's low 90's. Dyspneic at rest but states it is  secondary to pain and frustration, having "already gone over everything with Dr Rolena Infante". Pt states pain starts in her back. She is still sob with exertion. She is concerned about her slow improvement in resp status.   Objective:  Blood pressure (!) 104/54, pulse 68, temperature 98.1 F (36.7 C), temperature source Oral, resp. rate 17, height 5\' 2"  (1.575 m), weight 72.9 kg, last menstrual period 05/04/2008, SpO2 92 %.       No intake or output data in the 24 hours ending 01/22/20 0935 Filed Weights   01/12/20 0800 01/16/20 1600 01/22/20 0523  Weight: 68 kg 74.5 kg 72.9 kg    Examination: General: Middle-aged female no acute distress sitting on side of bed getting ready to ambulate HEENT: No JVD or lymphadenopathy appreciated Neuro: Grossly intact CV: Heart sounds regular regular rate rhythm PULM: In the bases  GI: soft, bsx4 active  Extremities: warm/dry,  edema  Skin: no rashes or lesions   Assessment & Plan:  Hypotension  Resolved Consider weaning midodrine per primary  Acute hypoxic respiratory failure O2 needs are down to 5 L Ambulating in hall  Back pain Per orthopedic  History of diabetes -SSI    Elevated lactic acid of 8.3 Resolved.  Best Practice:  Diet: Reg. Pain/Anxiety/Delirium protocol (if indicated): N/A. VAP protocol (if indicated): N/A. DVT prophylaxis: SCD's / lovenox. GI prophylaxis: N/A. Glucose control: SSI + home long acting Mobility: Bedrest. Code Status: Full. Family Communication: Patient updated at bedside Disposition: ICU or progressive stable. Per primary.   Labs   CBC: Recent Labs  Lab 01/20/20 2344 01/21/20 0450  WBC  --  9.8  NEUTROABS  --  6.0  HGB 8.3* 9.3*  HCT 26.1* 29.4*  MCV  --  98.7  PLT  --  888   Basic Metabolic Panel: Recent Labs  Lab 01/16/20 1054 01/17/20 0645 01/18/20 0500 01/19/20 0500 01/21/20 0450  NA 135 137 138 137 137  K 3.5 4.1 3.5 3.8 4.0  CL 98 101 101 98 98  CO2 30 30 29 31 29     GLUCOSE 213* 224* 192* 141* 120*  BUN 7 13 14 12 12   CREATININE 0.45 0.56 0.46 0.50 0.54  CALCIUM 8.5* 8.7* 8.1* 8.5* 8.7*  MG 2.1 2.1 1.9 2.2  --    GFR: Estimated Creatinine Clearance: 69.9 mL/min (by C-G formula based on SCr of 0.54 mg/dL). Recent Labs  Lab 01/20/20 2346 01/21/20 0450 01/21/20 1216 01/21/20 1507  WBC  --  9.8  --   --   LATICACIDVEN 8.3*  --  1.1 1.1   Liver Function Tests: No results for input(s): AST, ALT, ALKPHOS, BILITOT, PROT, ALBUMIN in the last 168 hours. No results for input(s): LIPASE, AMYLASE in the last 168 hours. No results for input(s): AMMONIA in the last 168 hours. ABG No results found for: PHART, PCO2ART, PO2ART, HCO3, TCO2, ACIDBASEDEF, O2SAT  Coagulation Profile: No results for input(s): INR, PROTIME in the last 168 hours. Cardiac Enzymes: No results for input(s): CKTOTAL, CKMB, CKMBINDEX, TROPONINI in the last 168 hours. HbA1C: Hgb A1c MFr Bld  Date/Time Value Ref Range Status  01/09/2020 09:49 AM 8.1 (H) 4.8 - 5.6 % Final    Comment:    (NOTE) Pre diabetes:          5.7%-6.4%  Diabetes:              >6.4%  Glycemic control for   <7.0% adults  with diabetes   04/09/2018 11:51 AM 6.8 (H) 4.8 - 5.6 % Final    Comment:    (NOTE) Pre diabetes:          5.7%-6.4% Diabetes:              >6.4% Glycemic control for   <7.0% adults with diabetes    CBG: Recent Labs  Lab 01/21/20 0714 01/21/20 1111 01/21/20 1626 01/21/20 2323 01/22/20 0555  GLUCAP 116* 160* 263* [place] [name] ACNP Acute Care Nurse Practitioner [place] Please consult Amion 01/22/2020, 9:35 AM

## 2020-01-22 NOTE — Progress Notes (Signed)
Patient arrived on the unit from 4N on a wheelchair, assessment completed see flow sheet placed on tele ccmd notified, patient oriented to room and staff, bed in lowest position, call bell within reach will continue to monitor.

## 2020-01-22 NOTE — Progress Notes (Signed)
PROGRESS NOTE    [name]  AQT:[id number] DOB: 1960-09-20 DOA: 01/11/2020 PCP: [name], MD   Brief Narrative:  [name] is a 59 y.o. female who has a PMH including HNP (herniated nucleus pulposus), lumbar; Anxiety disorder; Degeneration of lumbar intervertebral disc; Hemangioma; Primary insomnia; Insulin long-term use ([place]); Purpura ([place]); Trichotillomania; Moderate chronic obstructive pulmonary disease ([place]); Mixed hyperlipidemia; Folic acid deficiency; History of fracture; Malaise and fatigue; Coronary artery disease of native artery of native heart with stable angina pectoris ([place]); Diabetes mellitus type 1, uncomplicated ([place]); Dysphagia; Tobacco abuse; Dyspnea on exertion; Attention deficit; Intermittent claudication ([place]); History of metabolic disorder; Lung mass; Myositis; Erythrocytosis; Senile purpura ([place]); Overweight with body mass index (BMI) 25.0-29.9; Smoking greater than 30 pack years; Abnormal thyroid function test; Lumbar radiculopathy; Sciatica; Scoliosis deformity of spine; CAD (coronary artery disease); Secondary diabetes mellitus ([place]); Bursitis of both hips; Acute bilateral thoracic back pain; Alcohol use; Chronic bronchitis ([place]); Elevated LFTs; Foot pain; Lumbar post-laminectomy syndrome; Pain of left hip joint; Polyarthralgia; Primary osteoarthritis involving multiple joints; Coronary arteriosclerosis; Arteriosclerotic vascular disease; Cigarette smoker; Tobacco user; Diabetes mellitus due to underlying condition with unspecified complications ([place]); Fusion of lumbar spine  She presented to [facility] 9/15 for anterior lumbar fusion L5 - S1 and posterior L2 - L3 foraminotomy and discectomy.  The case was uncomplicated. Overnight 9/16, she had hypotension with SBP 80s.  She received 2L LR bolus but MAP remained in 50's.  PCCM subsequently asked to see in consultation. She has had fever to 101 and CXR shows possible PNA.  She required supplemental O2 up to 3L to maintain  sats > 90%.  She does endorse mild cough with minimal sputum production along with DOE.  Denies chills/sweats, headaches, N/V/D, abd pain, myalgias.  She states she is prone to PNA and has at least 1 occurrence every year and that this feels similar.  She denies chest pain, light headedness, LE edema, hemoptysis, hx of prior VTE, hx of malignancy.    Assessment & Plan:   Principal Problem:   Fusion of lumbar spine Active Problems:   Moderate chronic obstructive pulmonary disease (HCC)   Diabetes mellitus type 1, uncomplicated (HCC)   Respiratory failure (HCC)   HAP (hospital-acquired pneumonia)   Fever   Hypotension   Sepsis ([place])   Hypotension: unclear cause. Possible septic shock due to Pna  -Not POA, completed antibiotic course as below -Continue to maintain MAP > 65 -Levophed weaned off, continue midodrine  Acute hypoxic respiratory failure - presumed 2/2 CAP - Differential includes pulmonary edema -Cannot rule out ILD, previously on steroids -Completed antibiotics 01/21/2020 for suspected community-acquired pneumonia -Previous episode of acute hypoxia with sudden onset last year at outside facility, unspecified etiology at that time -Currently on prednisone, decreased to 40 mg daily -Chest x-ray is stable -Continue to wean oxygen, daily ambulatory O2 screens as patient will likely discharge on oxygen  Acute on chronic lumbago back pain during anterior lumbar fusion L5-S1; L2 and L3 -S/p anterior lumbar fusion L5-S1 and posterior L2-L3 foraminotomy and discectomy -Orthopedics continues to follow  Insulin-dependent diabetes type 2, uncontrolled Lab Results  Component Value Date   HGBA1C 8.1 (H) 01/09/2020  -Continue sliding scale insulin, hypoglycemic protocol, Tresiba 30 units daily   Lactic acidosis in the setting of hypotension and shock as above, resolved -Abdomen is soft, nontender, bowel sounds appreciated -Constipated-on MiraLAX, senna started on 23rd, likely  related to opiates for back pain -No evidence of acute abdomen -Fluid resuscitate  and repeat -Echocardiogram with normal ejection fraction   DVT prophylaxis: Lovenox Code Status: Full Family Communication: None present  Status is: Inpatient  Dispo: The patient is from: Home              Anticipated d/c is to: Home              Anticipated d/c date is: 24 to 48 hours pending clinical course              Patient currently not medically stable for discharge due to ongoing need for close monitoring in the setting of hypotension, hypoxia and likely need for further evaluation oxygen weaning prior to discharge home  Consultants:   PCCM, orthopedic surgery  Procedures:  - Anterior lumbar fusion L5-S1 and posterior L2-L3 foraminotomy and discectom  Antimicrobials:  Cefepime 9/17 > 9/21 Azithro 9/17 > 9/17. Ceftriaxone 9/17 > 9/17   Subjective: No acute issues or events overnight, patient feels drastically improved, requesting discharge home which we discussed would be limited due to patient's hypotension and hypoxia ongoing. Patient otherwise states she feels well enough to be discharged home and denies nausea, vomiting, diarrhea, constipation, headache, fevers, chills.  Objective: Vitals:   01/22/20 0108 01/22/20 0113 01/22/20 0514 01/22/20 0523  BP: (!) 165/66 (!) 159/66 (!) 128/55   Pulse: 61 (!) 58 61   Resp: 20 18 17 15   Temp: 97.9 F (36.6 C)  98.2 F (36.8 C)   TempSrc: Oral  Oral   SpO2: 99% 100% 97%   Weight:    72.9 kg  Height:       No intake or output data in the 24 hours ending 01/22/20 0743 Filed Weights   01/12/20 0800 01/16/20 1600 01/22/20 0523  Weight: 68 kg 74.5 kg 72.9 kg    Examination:  General exam: Appears calm and comfortable  Respiratory system: Clear to auscultation. Respiratory effort normal. Cardiovascular system: S1 & S2 heard, RRR. No JVD, murmurs, rubs, gallops or clicks. No pedal edema. Gastrointestinal system: Abdomen is nondistended,  soft and nontender. No organomegaly or masses felt. Normal bowel sounds heard. Central nervous system: Alert and oriented. No focal neurological deficits. Extremities: Symmetric 5 x 5 power. Skin: No rashes, lesions or ulcers Psychiatry: Judgement and insight appear normal. Mood & affect appropriate.     Data Reviewed: I have personally reviewed following labs and imaging studies  CBC: Recent Labs  Lab 01/15/20 0806 01/20/20 2344 01/21/20 0450  WBC 13.1*  --  9.8  NEUTROABS 11.2*  --  6.0  HGB 11.4* 8.3* 9.3*  HCT 35.0* 26.1* 29.4*  MCV 97.0  --  98.7  PLT 141*  --  235   Basic Metabolic Panel: Recent Labs  Lab 01/15/20 0806 01/15/20 0806 01/16/20 1054 01/17/20 0645 01/18/20 0500 01/19/20 0500 01/21/20 0450  NA 135   < > 135 137 138 137 137  K 4.0   < > 3.5 4.1 3.5 3.8 4.0  CL 97*   < > 98 101 101 98 98  CO2 27   < > 30 30 29 31 29   GLUCOSE 70   < > 213* 224* 192* 141* 120*  BUN 5*   < > 7 13 14 12 12   CREATININE 0.54   < > 0.45 0.56 0.46 0.50 0.54  CALCIUM 8.3*   < > 8.5* 8.7* 8.1* 8.5* 8.7*  MG 1.7  --  2.1 2.1 1.9 2.2  --    < > = values in this interval not  displayed.   GFR: Estimated Creatinine Clearance: 69.9 mL/min (by C-G formula based on SCr of 0.54 mg/dL). Liver Function Tests: No results for input(s): AST, ALT, ALKPHOS, BILITOT, PROT, ALBUMIN in the last 168 hours. No results for input(s): LIPASE, AMYLASE in the last 168 hours. No results for input(s): AMMONIA in the last 168 hours. Coagulation Profile: No results for input(s): INR, PROTIME in the last 168 hours. Cardiac Enzymes: No results for input(s): CKTOTAL, CKMB, CKMBINDEX, TROPONINI in the last 168 hours. BNP (last 3 results) No results for input(s): PROBNP in the last 8760 hours. HbA1C: No results for input(s): HGBA1C in the last 72 hours. CBG: Recent Labs  Lab 01/21/20 0714 01/21/20 1111 01/21/20 1626 01/21/20 2323 01/22/20 0555  GLUCAP 116* 160* 263* 155* 100*   Lipid  Profile: No results for input(s): CHOL, HDL, LDLCALC, TRIG, CHOLHDL, LDLDIRECT in the last 72 hours. Thyroid Function Tests: No results for input(s): TSH, T4TOTAL, FREET4, T3FREE, THYROIDAB in the last 72 hours. Anemia Panel: No results for input(s): VITAMINB12, FOLATE, FERRITIN, TIBC, IRON, RETICCTPCT in the last 72 hours. Sepsis Labs: Recent Labs  Lab 01/15/20 0806 01/20/20 2346 01/21/20 1216 01/21/20 1507  PROCALCITON 2.27  --   --   --   LATICACIDVEN  --  8.3* 1.1 1.1    Recent Results (from the past 240 hour(s))  SARS Coronavirus 2 by RT PCR (hospital order, performed in [facility] hospital lab) Nasopharyngeal Nasopharyngeal Swab     Status: None   Collection Time: 01/13/20  3:01 AM   Specimen: Nasopharyngeal Swab  Result Value Ref Range Status   SARS Coronavirus 2 NEGATIVE NEGATIVE Final    Comment: (NOTE) SARS-CoV-2 target nucleic acids are NOT DETECTED.  The SARS-CoV-2 RNA is generally detectable in upper and lower respiratory specimens during the acute phase of infection. The lowest concentration of SARS-CoV-2 viral copies this assay can detect is 250 copies / mL. A negative result does not preclude SARS-CoV-2 infection and should not be used as the sole basis for treatment or other patient management decisions.  A negative result may occur with improper specimen collection / handling, submission of specimen other than nasopharyngeal swab, presence of viral mutation(s) within the areas targeted by this assay, and inadequate number of viral copies (<250 copies / mL). A negative result must be combined with clinical observations, patient history, and epidemiological information.  Fact Sheet for Patients:   StrictlyIdeas.no  Fact Sheet for Healthcare Providers: BankingDealers.co.za  This test is not yet approved or  cleared by the [place] FDA and has been authorized for detection and/or diagnosis of SARS-CoV-2  by FDA under an Emergency Use Authorization (EUA).  This EUA will remain in effect (meaning this test can be used) for the duration of the COVID-19 declaration under Section 564(b)(1) of the Act, 21 U.S.C. section 360bbb-3(b)(1), unless the authorization is terminated or revoked sooner.  Performed at [facility], [address] [place], [place] [address]   Culture, blood (routine x 2)     Status: None   Collection Time: 01/13/20  3:20 AM   Specimen: BLOOD RIGHT ARM  Result Value Ref Range Status   Specimen Description BLOOD RIGHT ARM  Final   Special Requests   Final    BOTTLES DRAWN AEROBIC AND ANAEROBIC Blood Culture adequate volume   Culture   Final    NO GROWTH 5 DAYS Performed at [facility], [address], [place],  [address]    Report Status 01/18/2020  FINAL  Final  Culture, blood (routine x 2)     Status: None   Collection Time: 01/13/20  3:24 AM   Specimen: BLOOD LEFT ARM  Result Value Ref Range Status   Specimen Description BLOOD LEFT ARM  Final   Special Requests   Final    BOTTLES DRAWN AEROBIC AND ANAEROBIC Blood Culture adequate volume   Culture   Final    NO GROWTH 5 DAYS Performed at [facility], [address] [place], [place] [address]    Report Status 01/18/2020 FINAL  Final         Radiology Studies: DG CHEST PORT 1 VIEW  Result Date: 01/21/2020 CLINICAL DATA:  59 year old female with history of acute hypoxic respiratory failure EXAM: PORTABLE CHEST 1 VIEW COMPARISON:  01/18/2020, 01/14/2020, and CT chest from 01/13/2020 FINDINGS: The cardiomediastinal silhouette is partially obscured, unchanged. Heart size is within normal limits. Low lung volumes. Unchanged position of the indwelling right arm peripherally inserted central catheter with the tip projecting over the right atrium. Continued interval improvement in previously visualized peribronchovascular patchy pulmonary opacities. There is persistent mild hazy opacities  with a lower lobe predominance in the periphery, likely accentuated due to poor inspiratory effort. No appreciable pleural effusion or evidence of pneumothorax. The visualized upper abdomen and skeletal structures are within normal limits. IMPRESSION: Continued improvement in resolution of previously visualized pulmonary opacities. The previously visualized right arm PICC distal tip now projects over the right atrium, likely secondary to differences in projection from prior radiograph, however consider repositioning if the patient develops cardiac ectopy. Electronically Signed   By: Ruthann Cancer MD   On: 01/21/2020 09:34        Scheduled Meds:  ALPRAZolam  0.5 mg Oral QHS   Chlorhexidine Gluconate Cloth  6 each Topical Daily   docusate sodium  100 mg Oral BID   enoxaparin (LOVENOX) injection  40 mg Subcutaneous Q24H   FLUoxetine  80 mg Oral Daily   gabapentin  100 mg Oral Daily   gabapentin  300 mg Oral QHS   insulin aspart  0-20 Units Subcutaneous TID WC   insulin aspart  0-5 Units Subcutaneous QHS   insulin degludec  30 Units Subcutaneous Daily   linaclotide  290 mcg Oral QHS   midodrine  10 mg Oral TID WC   polyethylene glycol  17 g Oral Daily   predniSONE  40 mg Oral Q breakfast   senna  1 tablet Oral BID   sodium chloride flush  10-40 mL Intracatheter Q12H   sodium chloride flush  3 mL Intravenous Q12H   Continuous Infusions:  sodium chloride 10 mL/hr at 01/21/20 0300   methocarbamol (ROBAXIN) IV       LOS: 11 days   Time spent: 9min  Marygrace Sandoval C Lynleigh Kovack, DO [facility]  If 7PM-7AM, please contact night-coverage [email or web address]  01/22/2020, 7:43 AM

## 2020-01-23 LAB — GLUCOSE, CAPILLARY
Glucose-Capillary: 75 mg/dL (ref 70–99)
Glucose-Capillary: 141 mg/dL — ABNORMAL HIGH (ref 70–99)

## 2020-01-23 MED ORDER — FLUCONAZOLE 150 MG PO TABS
150.0000 mg | ORAL_TABLET | Freq: Once | ORAL | Status: AC
  Administered 2020-01-23: 150 mg via ORAL
  Filled 2020-01-23: qty 1

## 2020-01-23 MED ORDER — MIDODRINE HCL 10 MG PO TABS
10.0000 mg | ORAL_TABLET | Freq: Three times a day (TID) | ORAL | Status: DC
Start: 2020-01-23 — End: 2020-09-14

## 2020-01-23 MED ORDER — PREDNISONE 10 MG PO TABS: ORAL_TABLET | ORAL | 0 refills | Status: AC

## 2020-01-23 NOTE — Progress Notes (Signed)
PCCM Interval Progress Note  Follow up outpatient appointment has been made for Monday 02/13/20 at 10:00AM with Wyn Quaker, NP.  CXR at that time.   Montey Hora, [place] [facility] 01/23/2020, 9:46 AM

## 2020-01-23 NOTE — Discharge Summary (Signed)
Physician Discharge Summary  [name] JKD:[id number] DOB: 1960/08/22 DOA: 01/11/2020  PCP: [name], MD  Admit date: 01/11/2020 Discharge date: 01/23/2020  Admitted From: Home Disposition: Home with home health  Recommendations for Outpatient Follow-up:  1. Follow up with PCP in 1-2 weeks 2. Pulmonology as scheduled mid October  Home Health: ED Equipment/Devices: Oxygen, 3 and 1  Discharge Condition: Stable CODE STATUS: Full Diet recommendation: Diabetic diet  Brief/Interim Summary: Beth A Newlinis a 59 y.o.femalewho has a PMH including HNP (herniated nucleus pulposus), lumbar; Anxiety disorder; Degeneration of lumbar intervertebral disc; Hemangioma; Primary insomnia; Insulin long-term use ([place]); Purpura ([place]); Trichotillomania; Moderate chronic obstructive pulmonary disease ([place]); Mixed hyperlipidemia; Folic acid deficiency; History of fracture; Malaise and fatigue; Coronary artery disease of native artery of native heart with stable angina pectoris ([place]); Diabetes mellitus type 1, uncomplicated ([place]); Dysphagia; Tobacco abuse; Dyspnea on exertion; Attention deficit; Intermittent claudication ([place]); History of metabolic disorder; Lung mass; Myositis; Erythrocytosis; Senile purpura ([place]); Overweight with body mass index (BMI) 25.0-29.9; Smoking greater than 30 pack years; Abnormal thyroid function test; Lumbar radiculopathy; Sciatica; Scoliosis deformity of spine; CAD (coronary artery disease); Secondary diabetes mellitus ([place]); Bursitis of both hips; Acute bilateral thoracic back pain; Alcohol use; Chronic bronchitis ([place]); Elevated LFTs; Foot pain; Lumbar post-laminectomy syndrome; Pain of left hip joint; Polyarthralgia; Primary osteoarthritis involving multiple joints; Coronary arteriosclerosis; Arteriosclerotic vascular disease; Cigarette smoker; Tobacco user; Diabetes mellitus due to underlying condition with unspecified complications ([name]); Fusion of lumbar spine.  [facility] for anterior lumbar fusion L5 - S1 and posterior L2 - L3 foraminotomy and discectomy. The case was uncomplicated. Overnight 9/16, she had hypotension with SBP 80s. She received 2L LR bolus but MAP remained in 50's. PCCM subsequently asked to see in consultation. She has had fever to 101 and CXR shows possible PNA. She required supplemental O2 up to 3L to maintain sats >90%. She does endorse mild cough with minimal sputum production along with DOE. Denies chills/sweats, headaches, N/V/D, abd pain, myalgias. She states she is prone to PNA and has at least 1 occurrence every year and that this feels similar. She denies chest pain, light headedness, LE edema, hemoptysis, hx of prior VTE, hx of malignancy.  Patient met as above with plans for lumbar fusion, unfortunately postoperatively patient had profound hypotension hypoxia and concerning for community-acquired pneumonia.  Patient was admitted to the ICU from the OR, was weaned off of pressors and wean down on oxygen.  On the medical floor she continued to improve with physical therapy, oxygen now at 2 L nasal cannula without overt hypoxia or symptoms with ambulation.  Otherwise she has completed her antibiotic course for suspected community-acquired pneumonia per previous documentation and is otherwise stable and agreeable for discharge home.  Lengthy discussion at bedside about need for medication compliance and dietary compliance given A1c elevated at 8.1.  She otherwise is stable for follow-up with outpatient PCP, pulmonology as scheduled as well as with orthopedic surgery for postop wound care and imaging as indicated.  Discharge Diagnoses:  Principal Problem:   Fusion of lumbar spine Active Problems:   Moderate chronic obstructive pulmonary disease (HCC)   Diabetes mellitus type 1, uncomplicated (HCC)   Respiratory failure (HCC)   HAP (hospital-acquired pneumonia)   Fever   Hypotension   Sepsis  [facility])    Discharge Instructions  Discharge Instructions    Call MD for:  difficulty breathing, headache or visual disturbances   Complete by: As directed    Call  MD for:  persistant dizziness or light-headedness   Complete by: As directed    Diet - low sodium heart healthy   Complete by: As directed    Incentive spirometry RT   Complete by: As directed    Increase activity slowly   Complete by: As directed    No wound care   Complete by: As directed      Allergies as of 01/23/2020      Reactions   Celebrex [celecoxib] Swelling   Face swelling   Mobic [meloxicam] Swelling   Face swelling      Medication List    TAKE these medications   ALPRAZolam 0.5 MG tablet Commonly known as: XANAX Take 0.5 mg by mouth at bedtime.   atorvastatin 40 MG tablet Commonly known as: LIPITOR Take 40 mg by mouth at bedtime.   cyanocobalamin 1000 MCG/ML injection Commonly known as: (VITAMIN B-12) Inject 1,000 mcg into the muscle every 14 (fourteen) days.   FLUoxetine 40 MG capsule Commonly known as: PROZAC Take 80 mg by mouth daily.   folic acid 1 MG tablet Commonly known as: FOLVITE Take 1 mg by mouth at bedtime.   gabapentin 100 MG capsule Commonly known as: NEURONTIN Take 100-300 mg by mouth See admin instructions. Take 100 mg by mouth in the morning and 300 mg at bedtime   Linzess 290 MCG Caps capsule Generic drug: linaclotide Take 290 mcg by mouth at bedtime.   midodrine 10 MG tablet Commonly known as: PROAMATINE Take 1 tablet (10 mg total) by mouth 3 (three) times daily with meals.   nitroGLYCERIN 0.4 MG SL tablet Commonly known as: NITROSTAT Place 1 tablet (0.4 mg total) under the tongue every 5 (five) minutes as needed.   NovoLOG FlexPen 100 UNIT/ML FlexPen Generic drug: insulin aspart Inject 3-4 Units into the skin 3 (three) times daily as needed (blood sugar above 200).   predniSONE 10 MG tablet Commonly known as: DELTASONE Take 4 tablets (40 mg total) by  mouth daily for 3 days, THEN 3 tablets (30 mg total) daily for 3 days, THEN 2 tablets (20 mg total) daily for 3 days, THEN 1 tablet (10 mg total) daily for 3 days. Start taking on: January 23, 2020   Tyler Aas FlexTouch 200 UNIT/ML FlexTouch Pen Generic drug: insulin degludec Inject 34 Units into the skin daily.   True Metrix Blood Glucose Test test strip Generic drug: glucose blood CHECK BLOOD SUGARS THREE TIMES DAILY            Durable Medical Equipment  (From admission, onward)         Start     Ordered   01/23/20 1417  DME Oxygen  Once       Question Answer Comment  Length of Need 6 Months   Mode or (Route) Nasal cannula   Liters per Minute 2   Frequency Continuous (stationary and portable oxygen unit needed)   Oxygen conserving device No   Oxygen delivery system Gas      01/23/20 1416          Follow-up Information    Lauraine Rinne, NP Follow up on 02/13/2020.   Specialty: Pulmonary Disease Why: Your appointment is at 10:00AM. Contact information: [address] [address] [phone]        [facility] Follow up.   Specialty: Home Health Services Why: HHPT arranged- they will contact you to set up home visit for start of care Contact information:  [address] [place] [address] [phone]        [facility] Follow up.   Why: home 02 arranged- portable tank to be delivered to room for transport home- (home equipment to be delievered to home later today) Contact information: [place] [address] [phone]              Allergies  Allergen Reactions  . Celebrex [Celecoxib] Swelling    Face swelling  . Mobic [Meloxicam] Swelling    Face swelling    Consultations:  PCCM, orthopedic surgery   Procedures/Studies: DG Chest 2 View  Result Date: 01/09/2020 CLINICAL DATA:  Preoperative for lumbar fusion. EXAM: CHEST - 2 VIEW COMPARISON:  Two-view chest x-ray  08/29/2019 FINDINGS: The heart size is normal. No edema or effusion is present. No focal airspace disease present. The visualized soft tissues and bony thorax are unremarkable. IMPRESSION: Negative two view chest x-ray. Electronically Signed   By: San Morelle M.D.   On: 01/09/2020 15:35   DG Lumbar Spine 2-3 Views  Result Date: 01/11/2020 CLINICAL DATA:  59 year old female undergoing lumbar spine surgery. EXAM: DG C-ARM 1-60 MIN; LUMBAR SPINE - 2-3 VIEW FLUOROSCOPY TIME:  Fluoroscopy Time:  1 minutes 8 seconds Radiation Exposure Index (if provided by the fluoroscopic device): 45.1 mGy Number of Acquired Spot Images: 0 COMPARISON:  Lumbar radiographs 05/04/2014. FINDINGS: Normal lumbar segmentation on the 2016 comparison. Three intraoperative fluoroscopic spot views of the lumbar spine. There is anterior interbody fusion hardware now at L5-S1. On the final image a surgical probe is also directed toward the L2-L3 disc space. IMPRESSION: Intraoperative fluoroscopy demonstrating L2-L3 level localization and anterior interbody fusion at L5-S1. Electronically Signed   By: Genevie Ann M.D.   On: 01/11/2020 18:13   CT ANGIO CHEST PE W OR WO CONTRAST  Result Date: 01/13/2020 CLINICAL DATA:  Chest pain or shortness of breath.  Fever. EXAM: CT ANGIOGRAPHY CHEST WITH CONTRAST TECHNIQUE: Multidetector CT imaging of the chest was performed using the standard protocol during bolus administration of intravenous contrast. Multiplanar CT image reconstructions and MIPs were obtained to evaluate the vascular anatomy. CONTRAST:  65 cc OMNIPAQUE IOHEXOL 350 MG/ML SOLN COMPARISON:  Chest x-ray from yesterday FINDINGS: Cardiovascular: Satisfactory opacification of the pulmonary arteries to the segmental level. No evidence of pulmonary embolism. Normal heart size. No pericardial effusion. LAD atherosclerotic calcification. Mediastinum/Nodes: Mild hilar nodal prominence which is likely reactive. No pneumomediastinum  Lungs/Pleura: Extensive ground-glass opacity. Streaky density in the lower lobes with volume loss. The intervening lung is normal. No pleural effusion or pneumothorax. Upper Abdomen: Negative Musculoskeletal: Negative Review of the MIP images confirms the above findings. IMPRESSION: 1. Multifocal pneumonia with lower lobe atelectasis. 2. Negative for pulmonary embolism. Electronically Signed   By: Monte Fantasia M.D.   On: 01/13/2020 09:09   DG CHEST PORT 1 VIEW  Result Date: 01/21/2020 CLINICAL DATA:  59 year old female with history of acute hypoxic respiratory failure EXAM: PORTABLE CHEST 1 VIEW COMPARISON:  01/18/2020, 01/14/2020, and CT chest from 01/13/2020 FINDINGS: The cardiomediastinal silhouette is partially obscured, unchanged. Heart size is within normal limits. Low lung volumes. Unchanged position of the indwelling right arm peripherally inserted central catheter with the tip projecting over the right atrium. Continued interval improvement in previously visualized peribronchovascular patchy pulmonary opacities. There is persistent mild hazy opacities with a lower lobe predominance in the periphery, likely accentuated due to poor inspiratory effort. No appreciable pleural effusion or evidence of pneumothorax. The visualized  upper abdomen and skeletal structures are within normal limits. IMPRESSION: Continued improvement in resolution of previously visualized pulmonary opacities. The previously visualized right arm PICC distal tip now projects over the right atrium, likely secondary to differences in projection from prior radiograph, however consider repositioning if the patient develops cardiac ectopy. Electronically Signed   By: Ruthann Cancer MD   On: 01/21/2020 09:34   DG CHEST PORT 1 VIEW  Result Date: 01/18/2020 CLINICAL DATA:  Respiratory failure. EXAM: PORTABLE CHEST 1 VIEW COMPARISON:  01/14/2020 FINDINGS: 0944 hours. Interval improvement in right upper lobe and left mid lung aeration with  persistent diffuse interstitial and patchy bilateral airspace opacity. Dependent collapse/consolidation noted in the lung bases with potential tiny bilateral pleural effusions. Right PICC line is new in the interval with tip overlying the distal SVC level. Cardiopericardial silhouette is at upper limits of normal for size. The visualized bony structures of the thorax show no acute abnormality. Telemetry leads overlie the chest. IMPRESSION: 1. Interval improvement in bilateral airspace disease, as described. 2. Dependent collapse/consolidation in the lung bases with potential tiny bilateral pleural effusions. Electronically Signed   By: Misty Stanley M.D.   On: 01/18/2020 09:57   DG Chest Port 1 View  Result Date: 01/14/2020 CLINICAL DATA:  Evaluate for pneumonia versus atelectasis. EXAM: PORTABLE CHEST 1 VIEW COMPARISON:  January 12, 2020 FINDINGS: Significant infiltrate now seen in the right upper lobe and diffusely on the left but most marked in the left mid lower lungs pound the heart, hila, and mediastinum are normal. No pneumothorax. No other acute abnormalities. IMPRESSION: Significant interval worsening in the bilateral pulmonary infiltrates, now seen in the right upper lobe and diffusely on the left. The findings are consistent with multifocal pneumonia. Electronically Signed   By: Dorise Bullion III M.D   On: 01/14/2020 10:48   DG CHEST PORT 1 VIEW  Result Date: 01/12/2020 CLINICAL DATA:  Fever, short of breath, hypoxia EXAM: PORTABLE CHEST 1 VIEW COMPARISON:  01/09/2020 FINDINGS: Single frontal view of the chest demonstrates a stable cardiac silhouette. Linear areas of consolidation are seen in the bilateral perihilar regions, which could reflect developing pneumonia versus atelectasis. No effusion or pneumothorax. No acute bony abnormalities. IMPRESSION: 1. Bilateral perihilar linear consolidation, which could reflect developing bronchopneumonia given clinical presentation. Electronically  Signed   By: Randa Ngo M.D.   On: 01/12/2020 21:55   DG C-Arm 1-60 Min  Result Date: 01/11/2020 CLINICAL DATA:  59 year old female undergoing lumbar spine surgery. EXAM: DG C-ARM 1-60 MIN; LUMBAR SPINE - 2-3 VIEW FLUOROSCOPY TIME:  Fluoroscopy Time:  1 minutes 8 seconds Radiation Exposure Index (if provided by the fluoroscopic device): 45.1 mGy Number of Acquired Spot Images: 0 COMPARISON:  Lumbar radiographs 05/04/2014. FINDINGS: Normal lumbar segmentation on the 2016 comparison. Three intraoperative fluoroscopic spot views of the lumbar spine. There is anterior interbody fusion hardware now at L5-S1. On the final image a surgical probe is also directed toward the L2-L3 disc space. IMPRESSION: Intraoperative fluoroscopy demonstrating L2-L3 level localization and anterior interbody fusion at L5-S1. Electronically Signed   By: Genevie Ann M.D.   On: 01/11/2020 18:13   ECHOCARDIOGRAM COMPLETE  Result Date: 01/14/2020    ECHOCARDIOGRAM REPORT   Patient Name:   EUREKA VALDES Hulet Date of Exam: 01/14/2020 Medical Rec #:  [id number]      Height:       62.0 in Accession #:    [id number]     Weight:       150.0  lb Date of Birth:  11-Nov-1960      BSA:          1.692 m Patient Age:    59 years       BP:           125/55 mmHg Patient Gender: F              HR:           72 bpm. Exam Location:  Inpatient Procedure: 2D Echo Indications:    Dyspnea 786.09 / R06.00  History:        Patient has no prior history of Echocardiogram examinations.                 CAD, COPD, Signs/Symptoms:Chest Pain and Fever; Risk                 Factors:Diabetes, Dyslipidemia and Current Smoker. Fusion of                 lumbar spine, Respiratory Failure.  Sonographer:    Leavy Cella Referring Phys: [place]  1. Left ventricular ejection fraction, by estimation, is 60 to 65%. The left ventricle has normal function. The left ventricle has no regional wall motion abnormalities. Left ventricular diastolic parameters  were normal.  2. Right ventricular systolic function is normal. The right ventricular size is normal.  3. The mitral valve is normal in structure. No evidence of mitral valve regurgitation. No evidence of mitral stenosis.  4. The aortic valve is normal in structure. Aortic valve regurgitation is not visualized. No aortic stenosis is present.  5. The inferior vena cava is normal in size with greater than 50% respiratory variability, suggesting right atrial pressure of 3 mmHg. FINDINGS  Left Ventricle: Left ventricular ejection fraction, by estimation, is 60 to 65%. The left ventricle has normal function. The left ventricle has no regional wall motion abnormalities. The left ventricular internal cavity size was normal in size. There is  no left ventricular hypertrophy. Left ventricular diastolic parameters were normal. Right Ventricle: The right ventricular size is normal. No increase in right ventricular wall thickness. Right ventricular systolic function is normal. Left Atrium: Left atrial size was normal in size. Right Atrium: Right atrial size was normal in size. Pericardium: There is no evidence of pericardial effusion. Mitral Valve: The mitral valve is normal in structure. No evidence of mitral valve regurgitation. No evidence of mitral valve stenosis. Tricuspid Valve: The tricuspid valve is normal in structure. Tricuspid valve regurgitation is not demonstrated. No evidence of tricuspid stenosis. Aortic Valve: The aortic valve is normal in structure. Aortic valve regurgitation is not visualized. No aortic stenosis is present. Pulmonic Valve: The pulmonic valve was normal in structure. Pulmonic valve regurgitation is trivial. No evidence of pulmonic stenosis. Aorta: The aortic root is normal in size and structure. Venous: The inferior vena cava is normal in size with greater than 50% respiratory variability, suggesting right atrial pressure of 3 mmHg. IAS/Shunts: No atrial level shunt detected by color flow  Doppler.  LEFT VENTRICLE PLAX 2D LVIDd:         4.20 cm  Diastology LVIDs:         2.90 cm  LV e' medial:    9.46 cm/s LV PW:         1.00 cm  LV E/e' medial:  10.4 LV IVS:        0.80 cm  LV e' lateral:   9.90 cm/s LVOT diam:  2.00 cm  LV E/e' lateral: 9.9 LVOT Area:     3.14 cm  RIGHT VENTRICLE RV S prime:     13.60 cm/s TAPSE (M-mode): 2.8 cm LEFT ATRIUM             Index       RIGHT ATRIUM           Index LA diam:        3.40 cm 2.01 cm/m  RA Area:     12.00 cm LA Vol (A2C):   39.2 ml 23.17 ml/m RA Volume:   28.10 ml  16.61 ml/m LA Vol (A4C):   44.0 ml 26.01 ml/m LA Biplane Vol: 44.4 ml 26.25 ml/m   AORTA Ao Root diam: 2.80 cm MITRAL VALVE MV Area (PHT): 3.12 cm    SHUNTS MV Decel Time: 243 msec    Systemic Diam: 2.00 cm MV E velocity: 98.50 cm/s MV A velocity: 81.40 cm/s MV E/A ratio:  1.21 Candee Furbish MD Electronically signed by Candee Furbish MD Signature Date/Time: 01/14/2020/4:19:55 PM    Final    VAS [place] LOWER EXTREMITY VENOUS (DVT)  Result Date: 01/12/2020  Lower Venous DVTStudy Indications: S/p alif.  Comparison Study: no prior Performing Technologist: Abram Sander RVS  Examination Guidelines: A complete evaluation includes B-mode imaging, spectral Doppler, color Doppler, and power Doppler as needed of all accessible portions of each vessel. Bilateral testing is considered an integral part of a complete examination. Limited examinations for reoccurring indications may be performed as noted. The reflux portion of the exam is performed with the patient in reverse Trendelenburg.  +---------+---------------+---------+-----------+----------+--------------+ RIGHT    CompressibilityPhasicitySpontaneityPropertiesThrombus Aging +---------+---------------+---------+-----------+----------+--------------+ CFV      Full           Yes      Yes                                 +---------+---------------+---------+-----------+----------+--------------+ SFJ      Full                                                         +---------+---------------+---------+-----------+----------+--------------+ FV Prox  Full                                                        +---------+---------------+---------+-----------+----------+--------------+ FV Mid   Full                                                        +---------+---------------+---------+-----------+----------+--------------+ FV DistalFull                                                        +---------+---------------+---------+-----------+----------+--------------+ PFV      Full                                                        +---------+---------------+---------+-----------+----------+--------------+  POP      Full           Yes      Yes                                 +---------+---------------+---------+-----------+----------+--------------+ PTV      Full                                                        +---------+---------------+---------+-----------+----------+--------------+ PERO     Full                                                        +---------+---------------+---------+-----------+----------+--------------+   +---------+---------------+---------+-----------+----------+--------------+ LEFT     CompressibilityPhasicitySpontaneityPropertiesThrombus Aging +---------+---------------+---------+-----------+----------+--------------+ CFV      Full           Yes      Yes                                 +---------+---------------+---------+-----------+----------+--------------+ SFJ      Full                                                        +---------+---------------+---------+-----------+----------+--------------+ FV Prox  Full                                                        +---------+---------------+---------+-----------+----------+--------------+ FV Mid   Full                                                         +---------+---------------+---------+-----------+----------+--------------+ FV DistalFull                                                        +---------+---------------+---------+-----------+----------+--------------+ PFV      Full                                                        +---------+---------------+---------+-----------+----------+--------------+ POP      Full           Yes      Yes                                 +---------+---------------+---------+-----------+----------+--------------+  PTV      Full                                                        +---------+---------------+---------+-----------+----------+--------------+ PERO     Full                                                        +---------+---------------+---------+-----------+----------+--------------+     Summary: BILATERAL: - No evidence of deep vein thrombosis seen in the lower extremities, bilaterally. - No evidence of superficial venous thrombosis in the lower extremities, bilaterally. -   *See table(s) above for measurements and observations. Electronically signed by Monica Martinez MD on 01/12/2020 at 4:11:44 PM.    Final    [place] EKG SITE RITE  Result Date: 01/15/2020 If Site Rite image not attached, placement could not be confirmed due to current cardiac rhythm.  DG OR LOCAL ABDOMEN  Result Date: 01/11/2020 CLINICAL DATA:  Final instrument count. EXAM: OR LOCAL ABDOMEN COMPARISON:  None. FINDINGS: The bowel gas pattern is normal. Phleboliths are noted in the pelvis. Status post surgical anterior fusion of L5-S1. Surgical clips are seen anterior to the sacrum. No definite evidence of other foreign body is noted. IMPRESSION: Surgical clips are seen anterior to the sacrum. No definite evidence of other foreign body is noted. These results were called by telephone at the time of interpretation on 01/11/2020 at 2:53 pm to provider Jerelyn Charles, who verbally acknowledged these  results. Electronically Signed   By: Marijo Conception M.D.   On: 01/11/2020 14:54     Subjective: No acute issues or events overnight, patient denies shortness of breath, chest pain, nausea, vomiting, diarrhea, constipation, headache, fevers, chills.  Discharge Exam: Vitals:   01/23/20 0919 01/23/20 1059  BP: (!) 88/50 91/67  Pulse: 68 61  Resp: 16 20  Temp: (!) 97.3 F (36.3 C) 98.6 F (37 C)  SpO2: 92% 90%   Vitals:   01/23/20 0447 01/23/20 0848 01/23/20 0919 01/23/20 1059  BP: 106/65  (!) 88/50 91/67  Pulse: 65 80 68 61  Resp: _0 Temp: 98.2 F (36.8 C)  (!) 97.3 F (36.3 C) 98.6 F (37 C)  TempSrc: Oral  Oral Oral  SpO2: 94% 93% 92% 90%  Weight:      Height:        General: Pt is alert, awake, not in acute distress Cardiovascular: RRR, S1/S2 +, no rubs, no gallops Respiratory: CTA bilaterally, no wheezing, no rhonchi Abdominal: Soft, NT, ND, bowel sounds + Extremities: no edema, no cyanosis    The results of significant diagnostics from this hospitalization (including imaging, microbiology, ancillary and laboratory) are listed below for reference.     Microbiology: No results found for this or any previous visit (from the past 240 hour(s)).   Labs: BNP (last 3 results) Recent Labs    01/21/20 0200  BNP 161.0*   Basic Metabolic Panel: Recent Labs  Lab 01/17/20 0645 01/18/20 0500 01/19/20 0500 01/21/20 0450  NA 137 138 137 137  K 4.1 3.5 3.8 4.0  CL 101 101 98 98  CO2 _1 29  GLUCOSE 224* 192* 141* 120*  BUN _0 CREATININE 0.56 0.46 0.50 0.54  CALCIUM 8.7* 8.1* 8.5* 8.7*  MG 2.1 1.9 2.2  --    Liver Function Tests: No results for input(s): AST, ALT, ALKPHOS, BILITOT, PROT, ALBUMIN in the last 168 hours. No results for input(s): LIPASE, AMYLASE in the last 168 hours. No results for input(s): AMMONIA in the last 168 hours. CBC: Recent Labs  Lab 01/20/20 2344 01/21/20 0450  WBC  --  9.8  NEUTROABS  --  6.0  HGB 8.3*  9.3*  HCT 26.1* 29.4*  MCV  --  98.7  PLT  --  249   Cardiac Enzymes: No results for input(s): CKTOTAL, CKMB, CKMBINDEX, TROPONINI in the last 168 hours. BNP: Invalid input(s): POCBNP CBG: Recent Labs  Lab 01/22/20 1117 01/22/20 1556 01/22/20 2123 01/23/20 0622 01/23/20 1101  GLUCAP 146* 306* 151* 75 141*   D-Dimer No results for input(s): DDIMER in the last 72 hours. Hgb A1c No results for input(s): HGBA1C in the last 72 hours. Lipid Profile No results for input(s): CHOL, HDL, LDLCALC, TRIG, CHOLHDL, LDLDIRECT in the last 72 hours. Thyroid function studies No results for input(s): TSH, T4TOTAL, T3FREE, THYROIDAB in the last 72 hours.  Invalid input(s): FREET3 Anemia work up No results for input(s): VITAMINB12, FOLATE, FERRITIN, TIBC, IRON, RETICCTPCT in the last 72 hours. Urinalysis    Component Value Date/Time   COLORURINE YELLOW 01/09/2020 0949   APPEARANCEUR HAZY (A) 01/09/2020 0949   LABSPEC 1.024 01/09/2020 0949   PHURINE 5.0 01/09/2020 0949   GLUCOSEU NEGATIVE 01/09/2020 0949   HGBUR NEGATIVE 01/09/2020 0949   BILIRUBINUR NEGATIVE 01/09/2020 0949   KETONESUR NEGATIVE 01/09/2020 0949   PROTEINUR NEGATIVE 01/09/2020 0949   NITRITE NEGATIVE 01/09/2020 0949   LEUKOCYTESUR NEGATIVE 01/09/2020 0949   Sepsis Labs Invalid input(s): PROCALCITONIN,  WBC,  LACTICIDVEN Microbiology No results found for this or any previous visit (from the past 240 hour(s)).   Time coordinating discharge: Over 30 minutes  SIGNED:   Little Ishikawa, DO [facility] 01/23/2020, 3:28 PM Pager   If 7PM-7AM, please contact night-coverage [email or web address]

## 2020-01-23 NOTE — Progress Notes (Signed)
Physical Therapy Treatment Patient Details Name: [name] MRN: [id number] DOB: 09-28-1960 Today's Date: 01/23/2020    History of Present Illness Pt is a 59 y/o female who presents s/p L5-S1 ALIF and L2-L3 posterior discectomy on 01/11/2020. PMH significant for trichotillomania, DM, scoliosis, respiratory failure, OA, COPD, prior back surgery, dysphagia, CAD, bilateral bursitis of hips, ADD.    PT Comments    The pt is continuing to progress well with PT goals and mobility with decreased reliance on supplemental O2. The pt was on 2L at rest at the start of the session, and was able to maintain SpO2 in 90s on 0.5-1L O2 during short bouts of ambulation (<150 ft), during stair training, and with standing exercises. The pt continues to present with poor endurance and activity tolerance as SpO2 can drop as low as 80% with continued activity, requiring 2L O2 and seated rest to recover. The pt was able to progress to gait without use of AD at this time, but used significantly slower gait pattern with more rapid onset of drop in SpO2. The pt is safe to d/c home from a mobility standpoint with 1L O2 and assist from family as needed, but will continue to benefit from skilled PT to progress activity tolerance, endurance, strength, and dynamic stability for gait to facilitate return to prior level of function and independence.   OXYGEN SATURATION: At rest: 88-91% on 0.5 L O2 Walking: 90-97% on 1 L O2 for distances <150 ft; after 132ft SpO2 drops as low as 80 requiring 2L O2 and seated rest to recover.    Follow Up Recommendations  Home health PT;Supervision/Assistance - 24 hour     Equipment Recommendations  Rolling walker with 5" wheels;3in1 (PT)    Recommendations for Other Services       Precautions / Restrictions Precautions Precautions: Back Precaution Comments: pt maintains back precautions well, monitor SpO2 Required Braces or Orthoses: Spinal Brace Spinal Brace: Lumbar corset;Applied  in standing position;Applied in sitting position Restrictions Weight Bearing Restrictions: No    Mobility  Bed Mobility               General bed mobility comments: pt OOB in recliner  Transfers Overall transfer level: Needs assistance Equipment used: None Transfers: Sit to/from Stand Sit to Stand: Supervision         General transfer comment: no assist to power up, 5xsts in 26 sec from chair without use of UE  Ambulation/Gait Ambulation/Gait assistance: Supervision Gait Distance (Feet): 200 Feet (+ 45 ft + 75 ft) Assistive device: None Gait Pattern/deviations: Step-through pattern;Decreased stride length Gait velocity: reduced Gait velocity interpretation: <1.8 ft/sec, indicate of risk for recurrent falls General Gait Details: slow, short strides with slightly increased lateral movements. focus on breathing with gait, able to maintain 90-100% on 0.5-1L O2 during gait, drops to low 80s after ~123ft ambulation requiring 2L to recover   Stairs Stairs: Yes Stairs assistance: Supervision Stair Management: One rail Right;Step to pattern;Forwards Number of Stairs: 3 General stair comments: VC's for sequencing and general safety. Pt advancing with LLE to ascend and RLE to descend   Wheelchair Mobility    Modified Rankin (Stroke Patients Only)       Balance Overall balance assessment: Mild deficits observed, not formally tested Sitting-balance support: No upper extremity supported;Feet supported Sitting balance-Leahy Scale: Good     Standing balance support: No upper extremity supported;During functional activity Standing balance-Leahy Scale: Good Standing balance comment: able to walk without UE support and maintain against mod  challenge                            Cognition Arousal/Alertness: Awake/alert Behavior During Therapy: WFL for tasks assessed/performed Overall Cognitive Status: Within Functional Limits for tasks assessed                                  General Comments: able to recall precautions, good awareness of safety, importance of SpO2, and energy conservation      Exercises General Exercises - Lower Extremity Hip Flexion/Marching: AROM;Standing;Both;20 reps Mini-Sqauts: Strengthening;Standing;Both;10 reps    General Comments General comments (skin integrity, edema, etc.): pt on 2L O2 at start of session with SpO2 93-100%, weaned to 0.5L during ambulaiton and pt maintained 89-95% on 0.5L with cues for PLB. After ~150 ft SpO2 dropped to low 80s requiring 2L O2 and standing rest break to recover to 90s. pt denies feeling SOB. Able to complete STS and standing exercises on RA, but left on 0.5L with SpO2 88-92%.      Pertinent Vitals/Pain Pain Assessment: No/denies pain Pain Intervention(s): Limited activity within patient's tolerance;Monitored during session           PT Goals (current goals can now be found in the care plan section) Acute Rehab PT Goals Patient Stated Goal: Be able to breathe better without Oxygen PT Goal Formulation: With patient Time For Goal Achievement: 02/02/20 Potential to Achieve Goals: Good Progress towards PT goals: Progressing toward goals    Frequency    Min 5X/week      PT Plan Current plan remains appropriate       AM-PAC PT "6 Clicks" Mobility   Outcome Measure  Help needed turning from your back to your side while in a flat bed without using bedrails?: A Little Help needed moving from lying on your back to sitting on the side of a flat bed without using bedrails?: A Little Help needed moving to and from a bed to a chair (including a wheelchair)?: None Help needed standing up from a chair using your arms (e.g., wheelchair or bedside chair)?: None Help needed to walk in hospital room?: None Help needed climbing 3-5 steps with a railing? : A Little 6 Click Score: 21    End of Session Equipment Utilized During Treatment: Oxygen;Gait belt;Back  brace Activity Tolerance: Patient tolerated treatment well Patient left: in chair;with call bell/phone within reach Nurse Communication: Mobility status (decrease in O2) PT Visit Diagnosis: Unsteadiness on feet (R26.81);Pain Pain - part of body:  (back)     Time: 3212-2482 PT Time Calculation (min) (ACUTE ONLY): 35 min  Charges:  $Gait Training: 23-37 mins                     Karma Ganja, PT, DPT   Acute Rehabilitation Department Pager #: [phone]   Otho Bellows 01/23/2020, 9:02 AM

## 2020-01-23 NOTE — Progress Notes (Signed)
Occupational Therapy Treatment Patient Details Name: [name] MRN: [id number] DOB: 1960-08-03 Today's Date: 01/23/2020    History of present illness Pt is a 59 y/o female who presents s/p L5-S1 ALIF and L2-L3 posterior discectomy on 01/11/2020. PMH significant for trichotillomania, DM, scoliosis, respiratory failure, OA, COPD, prior back surgery, dysphagia, CAD, bilateral bursitis of hips, ADD.   OT comments  Patient seen this date to review hip kit.  Patient able to demonstrate hip kit use after education and all questions answered.  Patient is down to 1/2 L O2 via [place], but continues to de-sat from 91% to 87% almost immediately with minimal exertion.  Patient educated on therapeutic rest break and pursed lip breathing.  Good understanding, and patient able to demonstrate good carry over after education. Patient able to rebound to 91% on 1/2 L O2 after 20-25 sec of rest and use of inspirometer.  Declines to activity tolerance and slowed pace with mobility continue to impact her self care abilities.  Her mother will be staying with her at discharge to help with ADL/IADL's, but [facility] OT may be a good idea to assist with her transition to home with O2 needs.  OT will continue to follow in the acute care setting. Back precautions reviewed, patient recalled 2/3 with one VC for lifting precautions.     Follow Up Recommendations  Home health OT;Supervision/Assistance - 24 hour    Equipment Recommendations  3 in 1 bedside commode;Other (comment) (hip kit)    Recommendations for Other Services      Precautions / Restrictions Precautions Precautions: Back Precaution Booklet Issued: No Precaution Comments: O2 sats Required Braces or Orthoses: Spinal Brace Spinal Brace: Lumbar corset Restrictions Weight Bearing Restrictions: No       Mobility Bed Mobility               General bed mobility comments: pt OOB in recliner  Transfers Overall transfer level: Needs assistance Equipment used:  None Transfers: Sit to/from Stand Sit to Stand: Supervision         General transfer comment: no assist to power up, 5xsts in 26 sec from chair without use of UE    Balance Overall balance assessment: Mild deficits observed, not formally tested Sitting-balance support: No upper extremity supported;Feet supported Sitting balance-Leahy Scale: Good     Standing balance support: No upper extremity supported;During functional activity Standing balance-Leahy Scale: Good Standing balance comment: able to walk without UE support and maintain against mod challenge                           ADL either performed or assessed with clinical judgement   ADL                   Upper Body Dressing : Set up;Sitting   Lower Body Dressing: Minimal assistance;Sit to/from stand Lower Body Dressing Details (indicate cue type and reason): able to practice with hip kit. Toilet Transfer: Copy Details (indicate cue type and reason): O2 0.5 L via [place]         Functional mobility during ADLs: Supervision/safety       Vision Patient Visual Report: No change from baseline     Perception     Praxis      Cognition Arousal/Alertness: Awake/alert Behavior During Therapy: WFL for tasks assessed/performed Overall Cognitive Status: Within Functional Limits for tasks assessed  General Comments: able to recall precautions, good awareness of safety, importance of SpO2, and energy conservation        Exercises    Shoulder Instructions       General Comments     Pertinent Vitals/ Pain       Pain Assessment: No/denies pain Faces Pain Scale: Hurts a little bit Pain Location: back Pain Descriptors / Indicators: Sore Pain Intervention(s): Monitored during session                                                          Frequency  Min 2X/week        Progress Toward  Goals  OT Goals(current goals can now be found in the care plan section)  Progress towards OT goals: Progressing toward goals  Acute Rehab OT Goals Patient Stated Goal: Less O2 requirements this date. OT Goal Formulation: With patient Time For Goal Achievement: 01/30/20 Potential to Achieve Goals: Good  Plan Discharge plan remains appropriate    Co-evaluation                 AM-PAC OT "6 Clicks" Daily Activity     Outcome Measure   Help from another person eating meals?: None Help from another person taking care of personal grooming?: None Help from another person toileting, which includes using toliet, bedpan, or urinal?: A Little Help from another person bathing (including washing, rinsing, drying)?: A Little Help from another person to put on and taking off regular upper body clothing?: A Little Help from another person to put on and taking off regular lower body clothing?: A Little 6 Click Score: 20    End of Session Equipment Utilized During Treatment: Rolling walker;Back brace;Oxygen  OT Visit Diagnosis: Other abnormalities of gait and mobility (R26.89);Muscle weakness (generalized) (M62.81)   Activity Tolerance Patient tolerated treatment well   Patient Left in chair;with call bell/phone within reach   Nurse Communication          Time: 1660-6004 OT Time Calculation (min): 30 min  Charges: OT General Charges $OT Visit: 1 Visit OT Treatments $Self Care/Home Management : 23-37 mins  01/23/2020  Rich, OTR/L  Acute Rehabilitation Services  Office:  [phone]    [name] 01/23/2020, 11:43 AM

## 2020-01-23 NOTE — Progress Notes (Addendum)
SATURATION QUALIFICATIONS: (This note is used to comply with regulatory documentation for home oxygen)  Patient Saturations on 0.5L O2 at Rest = 89%   Patient Saturations on Room Air while Ambulating = 85%  Patient Saturations on 1 Liters of oxygen while Ambulating = 90%  Please briefly explain why patient needs home oxygen: The pt is unable to consistently maintain SpO2 >90% without 1L of O2 during mobility at this time and will benefit from supplemental O2 to allow for increased activity at home.   Hardie Pulley, DPT   Acute Rehabilitation Department Pager #: [phone]

## 2020-01-23 NOTE — TOC Transition Note (Signed)
Transition of Care (TOC) - CM/SW Discharge Note Marvetta Gibbons RN, BSN Transitions of Care Unit 4E- RN Case Manager See Treatment Team for direct phone #    Patient Details  Name: [name] MRN: [id number] Date of Birth: [birth year]  Transition of Care [facility]) CM/SW Contact:  Dawayne Patricia, RN Phone Number: 01/23/2020, 3:13 PM   Clinical Narrative:    Pt stable for transition home today, orders have been placed for HHPT and DME- home 02. CM spoke with pt at bedside for [facility] and DME choice- per pt she does not have a preference for DME- just wants 02 delivered quickly. Is agreeable to use in house provider [facility]-  Address, phone # and PCP all confirmed with pt in epic.  List provided to pt Per CMS guidelines from medicare.gov website with star ratings (copy placed in shadow chart) per pt she is familiar with [facility] would like to try them as first choice, then [place] of [facility]. And then from there does not have a preference and will defer to CM to find an agency if needed.  Call made to Amy with [facility] for [facility] needs- referral has been accepted for HHPT needs.   Call made to [facility] with [facility] for home 02 needs- order has been expedited for processing and delivery.   Final next level of care: [place] Barriers to Discharge: No Barriers Identified   Patient Goals and CMS Choice Patient states their goals for this hospitalization and ongoing recovery are:: to return home and get off oxygen CMS Medicare.gov Compare Post Acute Care list provided to:: Patient Choice offered to / list presented to : Patient  Discharge Placement                Home with [facility]      Discharge Plan and Services   Discharge Planning Services: CM Consult Post Acute Care Choice: Durable Medical Equipment, Home Health          DME Arranged: Oxygen DME Agency: [facility] Date DME Agency Contacted: 01/23/20 Time DME Agency Contacted: 1440 Representative spoke with at  DME Agency: Thedore Mins HH Arranged: PT [place] Date [place]: 01/23/20 Time [place]: [place] Representative spoke with at [place]:  [place] ([place]) Interventions     Readmission Risk Interventions Readmission Risk Prevention Plan 01/23/2020  Transportation Screening Complete  PCP or Specialist Appt within 5-7 Days Complete  Home Care Screening Complete  Medication Review (RN CM) Complete  Some recent data might be hidden

## 2020-01-25 DIAGNOSIS — Z8701 Personal history of pneumonia (recurrent): Secondary | ICD-10-CM

## 2020-01-25 DIAGNOSIS — Z8619 Personal history of other infectious and parasitic diseases: Secondary | ICD-10-CM | POA: Insufficient documentation

## 2020-01-25 HISTORY — DX: Personal history of pneumonia (recurrent): Z87.01

## 2020-01-25 HISTORY — DX: Personal history of other infectious and parasitic diseases: Z86.19

## 2020-02-13 ENCOUNTER — Inpatient Hospital Stay: Payer: BC Managed Care – PPO | Admitting: Pulmonary Disease

## 2020-03-09 DIAGNOSIS — M25572 Pain in left ankle and joints of left foot: Secondary | ICD-10-CM

## 2020-03-09 HISTORY — DX: Pain in left ankle and joints of left foot: M25.572

## 2020-03-20 DIAGNOSIS — M1990 Unspecified osteoarthritis, unspecified site: Secondary | ICD-10-CM | POA: Insufficient documentation

## 2020-03-20 DIAGNOSIS — J189 Pneumonia, unspecified organism: Secondary | ICD-10-CM | POA: Insufficient documentation

## 2020-03-20 DIAGNOSIS — E119 Type 2 diabetes mellitus without complications: Secondary | ICD-10-CM | POA: Insufficient documentation

## 2020-03-21 ENCOUNTER — Encounter: Payer: Self-pay | Admitting: Cardiology

## 2020-03-21 ENCOUNTER — Other Ambulatory Visit: Payer: Self-pay

## 2020-03-21 ENCOUNTER — Ambulatory Visit (INDEPENDENT_AMBULATORY_CARE_PROVIDER_SITE_OTHER): Payer: BC Managed Care – PPO | Admitting: Cardiology

## 2020-03-21 VITALS — BP 120/78 | HR 80 | Ht 62.0 in | Wt 149.0 lb

## 2020-03-21 DIAGNOSIS — I25118 Atherosclerotic heart disease of native coronary artery with other forms of angina pectoris: Secondary | ICD-10-CM

## 2020-03-21 DIAGNOSIS — E088 Diabetes mellitus due to underlying condition with unspecified complications: Secondary | ICD-10-CM | POA: Diagnosis not present

## 2020-03-21 DIAGNOSIS — F17210 Nicotine dependence, cigarettes, uncomplicated: Secondary | ICD-10-CM

## 2020-03-21 DIAGNOSIS — E782 Mixed hyperlipidemia: Secondary | ICD-10-CM | POA: Diagnosis not present

## 2020-03-21 DIAGNOSIS — Z720 Tobacco use: Secondary | ICD-10-CM | POA: Diagnosis not present

## 2020-03-21 MED ORDER — VARENICLINE TARTRATE 0.5 MG PO TABS: 0.5000 mg | ORAL_TABLET | Freq: Two times a day (BID) | ORAL | 2 refills | Status: DC

## 2020-03-21 NOTE — Progress Notes (Signed)
Cardiology Office Note:    Date:  03/21/2020   ID:  [name], DOB 1960/06/15, MRN [id number]  PCP:  [name], MD  Cardiologist:  [name], MD   Referring MD: [name], MD    ASSESSMENT:    1. Coronary artery disease of native artery of native heart with stable angina pectoris ([place])   2. Mixed hyperlipidemia   3. Tobacco abuse   4. Diabetes mellitus due to underlying condition with unspecified complications ([place])   5. Cigarette smoker    PLAN:    In order of problems listed above:  1. Coronary artery disease: Secondary prevention stressed with the patient.  Importance of compliance with diet medication stressed and she vocalized understanding.  She cannot ambulate much because of significant orthopedic issues which are ongoing. 2. Essential hypertension: Blood pressure stable and diet was emphasized 3. Mixed dyslipidemia: On statin therapy and tolerating well.  Lipids were reviewed. 4. Cigarette smoker: I spent 5 minutes with the patient discussing solely about smoking. Smoking cessation was counseled. I suggested to the patient also different medications and pharmacological interventions. Patient is keen to try stopping and requests Chantix.  We will send her a low-dose.  She has tried this in the past without much side effects.  She has quit smoking in the past with Chantix.  She will also check with her pharmacist about drug interactions before starting the medications.  He will get back to me if he needs any further assistance in this matter. 5. Patient will be seen in follow-up appointment in 6 months or earlier if the patient has any concerns    Medication Adjustments/Labs and Tests Ordered: Current medicines are reviewed at length with the patient today.  Concerns regarding medicines are outlined above.  No orders of the defined types were placed in this encounter.  No orders of the defined types were placed in this encounter.    Chief  Complaint  Patient presents with  . Leg Swelling     History of Present Illness:    [name] is a 58 y.o. female.  Patient has past medical history of coronary artery disease, essential hypertension dyslipidemia and diabetes mellitus.  She denies any problems at this time and takes care of her activities of daily living.  She has multiple orthopedic problems and has undergone surgeries and treatment with pain medicines.  At the time of my evaluation, the patient is alert awake oriented and in no distress.  Past Medical History:  Diagnosis Date  . Abnormal thyroid function test 12/06/2015  . Acute bilateral thoracic back pain 12/03/2018  . Alcohol use 04/15/2019   Formatting of this note might be different from the original. Recommend abstinence from alcohol  . Anxiety disorder 06/05/2015   Last Assessment & Plan:  Formatting of this note might be different from the original. Had started her on some xanax about week ago and she feels it has helped her and she is taking about half tablet for this and will continue with it as she has been compliant with it  . Arteriosclerotic vascular disease 11/06/2017  . Arthritis    back   . Atherosclerosis 06/05/2015   Formatting of this note might be different from the original. Seen on CTs of chest.  . Attention deficit 12/04/2017  . Bursitis of both hips 04/14/2018  . CAD (coronary artery disease) 12/24/2017  . Chest pain 11/06/2017  . Chronic bronchitis ([place]) 12/04/2015   Formatting of this  note might be different from the original. PFT ratio 88% FEV1 103% FVC 94% DLCO 76%  Last Assessment & Plan:  Formatting of this note might be different from the original. We discussed this as she has been coughing again for about 2 mnths and she sounds congested on exam with previous ease for pneumonia will place her on levaquin and have reviewed with her the importance of her   . Cigarette smoker 02/26/2016  . Coronary arteriosclerosis 12/24/2017  . Coronary artery  disease of native artery of native heart with stable angina pectoris ([place]) 01/25/2016   Formatting of this note might be different from the original. 2017:  50% LAD lesion.  Calcium noted other places.  Heart cath December 31, 2017 with a 30% ostial LAD lesion otherwise normal coronary arteries seen with LVEF 55 to 65%  . Degeneration of lumbar intervertebral disc 11/06/2017  . Diabetes mellitus type 1, uncomplicated ([place]) 19/50/9326   Added automatically from request for surgery [id number]  Formatting of this note might be different from the original. Added automatically from request for surgery [phone]  . Diabetes mellitus without complication (HCC)    Type 1- injections only- recent change to [place] with improved A1C reading.  Marland Kitchen Dysphagia 11/04/2017  . Dyspnea on exertion 12/04/2017  . Elevated LFTs 03/10/2019   Formatting of this note might be different from the original. History.  Cannot change to this.  . Erythrocytosis 12/04/2017  . Fatigue 11/06/2017  . Folic acid deficiency 12/20/537  . Foot pain 01/28/2018  . Hemangioma 11/06/2017  . History of fracture 11/06/2017  . History of metabolic disorder 11/01/7339  . HNP (herniated nucleus pulposus), lumbar 05/10/2014  . Insulin long-term use ([place]) 11/06/2017  . Intermittent claudication ([place]) 12/04/2017  . Lumbar post-laminectomy syndrome 03/04/2019  . Lumbar radiculopathy 05/26/2017  . Lung mass 12/04/2017  . Malaise and fatigue 11/06/2017  . Menopausal disorder 11/06/2017  . Mixed hyperlipidemia 04/09/2016   Formatting of this note might be different from the original. Added automatically from request for surgery [phone]  . Moderate chronic obstructive pulmonary disease ([place]) 11/06/2017  . Multiple actinic keratoses 11/06/2017  . Myositis 12/04/2017  . Overweight with body mass index (BMI) 25.0-29.9 07/11/2015   Last Assessment & Plan:  Relevant Hx: Course: Daily Update: Today's Plan:difficult for her with the insulin as well as her abilify and the weight is  increaseing for her rather than decreasing, she has cut her alcohol intake back, she has worked on her diet more with decreasing her carb intake as well and still difficulty discussed with the saxenda and will try to get this approved for her and see   . Pain of left hip joint 07/23/2018  . Pneumonia   . Polyarthralgia 06/11/2018  . Primary insomnia 06/05/2015  . Primary osteoarthritis involving multiple joints 08/24/2019  . Purpura ([place]) 11/06/2017  . Respiratory failure ([place]) 11/2018  . Sciatica 11/03/2017  . Scoliosis deformity of spine 11/03/2017  . Secondary diabetes mellitus ([place]) 12/24/2017  . Senile purpura ([place]) 12/04/2017  . Smoking greater than 30 pack years 02/26/2016  . Tobacco abuse 02/11/2016  . Tobacco user 02/11/2016  . Trichotillomania    hx.     Past Surgical History:  Procedure Laterality Date  . ABDOMINAL EXPOSURE N/A 01/11/2020   Procedure: ABDOMINAL EXPOSURE;  Surgeon: Marty Heck, MD;  Location: [facility];  Service: Vascular;  Laterality: N/A;  . ANTERIOR LUMBAR FUSION N/A 01/11/2020   Procedure: ANTERIOR LUMBAR INTERBODY FUSION  LUMBAR FIVE-SACRAL ONE,  LEFT LUMBAR TWO-THREE FORAMINOTOMY, DISECTOMY;  Surgeon: Melina Schools, MD;  Location: [facility];  Service: Orthopedics;  Laterality: N/A;  . APPENDECTOMY  04/28/1970  . BACK SURGERY  2016  . CARDIAC CATHETERIZATION  12/31/2017  . COLONOSCOPY  04/28/2017  . EXCISION/RELEASE BURSA HIP Left 04/14/2018   Procedure: Excision trochanteric bursa left hip;  Surgeon: Susa Day, MD;  Location: [facility];  Service: Orthopedics;  Laterality: Left;  60 mins  . HAND SURGERY    . HIP OPEN REDUCTION Right    ORIF  . KNEE ARTHROSCOPY    . LEFT HEART CATH AND CORONARY ANGIOGRAPHY N/A 12/31/2017   Procedure: LEFT HEART CATH AND CORONARY ANGIOGRAPHY;  Surgeon: Martinique, Peter M, MD;  Location: [facility];  Service: Cardiovascular;  Laterality: N/A;  . LUMBAR LAMINECTOMY/DECOMPRESSION MICRODISCECTOMY Left 05/10/2014   Procedure:  MICRODISCECTOMY LUMBAR DECOMPRESSION L5-S1 LEFT ;  Surgeon: Johnn Hai, MD;  Location: [facility];  Service: Orthopedics;  Laterality: Left;  . LUMBAR LAMINECTOMY/DECOMPRESSION MICRODISCECTOMY N/A 01/11/2020   Procedure: LUMBAR LAMINECTOMY/DECOMPRESSION MICRODISCECTOMY LEFT LUMBAR TWO-THREE;  Surgeon: Melina Schools, MD;  Location: [facility];  Service: Orthopedics;  Laterality: N/A;  . ORIF WRIST FRACTURE Right    retained hardware  . ORTHOPAEDIC SURGERY  04/14/2018  . TUBAL LIGATION  12/25/1995    Current Medications: Current Meds  Medication Sig  . ALPRAZolam (XANAX) 0.5 MG tablet Take 0.5 mg by mouth at bedtime.   Marland Kitchen atorvastatin (LIPITOR) 40 MG tablet Take 40 mg by mouth at bedtime.  . cyanocobalamin (,VITAMIN B-12,) 1000 MCG/ML injection Inject 1,000 mcg into the muscle every 14 (fourteen) days.  Marland Kitchen FLUoxetine (PROZAC) 40 MG capsule Take 80 mg by mouth daily.   . fluticasone (FLOVENT HFA) 110 MCG/ACT inhaler Inhale 1 puff into the lungs in the morning and at bedtime.  . folic acid (FOLVITE) 1 MG tablet Take 1 mg by mouth at bedtime.   . gabapentin (NEURONTIN) 100 MG capsule Take 100-300 mg by mouth See admin instructions. Take 100 mg by mouth in the morning and 300 mg at bedtime  . insulin aspart (NOVOLOG FLEXPEN) 100 UNIT/ML FlexPen Inject 3-4 Units into the skin 3 (three) times daily as needed (blood sugar above 200).   . insulin degludec (TRESIBA FLEXTOUCH) 200 UNIT/ML FlexTouch Pen Inject 34 Units into the skin daily.   Marland Kitchen linaclotide (LINZESS) 290 MCG CAPS capsule Take 290 mcg by mouth at bedtime.   . nitroGLYCERIN (NITROSTAT) 0.4 MG SL tablet Place 1 tablet (0.4 mg total) under the tongue every 5 (five) minutes as needed.     Allergies:   Celebrex [celecoxib] and Mobic [meloxicam]   Social History   Socioeconomic History  . Marital status: Divorced    Spouse name: Not on file  . Number of children: Not on file  . Years of education: Not on file  . Highest education level: Not on  file  Occupational History  . Not on file  Tobacco Use  . Smoking status: Current Some Day Smoker    Packs/day: 0.50    Years: 30.00    Pack years: 15.00    Types: Cigarettes  . Smokeless tobacco: Never Used  Vaping Use  . Vaping Use: Never used  Substance and Sexual Activity  . Alcohol use: Yes    Comment: weekends - social  . Drug use: No  . Sexual activity: Not Currently  Other Topics Concern  . Not on file  Social History Narrative  . Not on file  Social Determinants of Health   Financial Resource Strain:   . Difficulty of Paying Living Expenses: Not on file  Food Insecurity:   . Worried About Charity fundraiser in the Last Year: Not on file  . Ran Out of Food in the Last Year: Not on file  Transportation Needs:   . Lack of Transportation (Medical): Not on file  . Lack of Transportation (Non-Medical): Not on file  Physical Activity:   . Days of Exercise per Week: Not on file  . Minutes of Exercise per Session: Not on file  Stress:   . Feeling of Stress : Not on file  Social Connections:   . Frequency of Communication with Friends and Family: Not on file  . Frequency of Social Gatherings with Friends and Family: Not on file  . Attends Religious Services: Not on file  . Active Member of Clubs or Organizations: Not on file  . Attends Archivist Meetings: Not on file  . Marital Status: Not on file     Family History: The patient's family history includes Cancer in her mother; Diabetes in her father and mother; Heart disease in her father; Hypertension in her father.  ROS:   Please see the history of present illness.    All other systems reviewed and are negative.  EKGs/Labs/Other Studies Reviewed:    The following studies were reviewed today: I discussed my findings with the patient at extensive length   Recent Labs: 01/13/2020: ALT 31 01/19/2020: Magnesium 2.2 01/21/2020: B Natriuretic Peptide 225.6; BUN 12; Creatinine, Ser 0.54; Hemoglobin 9.3;  Platelets 249; Potassium 4.0; Sodium 137  Recent Lipid Panel    Component Value Date/Time   CHOL 186 10/11/2019 0950   TRIG 136 10/11/2019 0950   HDL 49 10/11/2019 0950   CHOLHDL 3.8 10/11/2019 0950   LDLCALC 113 (H) 10/11/2019 0950    Physical Exam:    VS:  BP 120/78 (BP Location: Right Arm, Patient Position: Sitting)   Pulse 80   Ht 5\' 2"  (1.575 m)   Wt 149 lb (67.6 kg)   LMP 05/04/2008   SpO2 97%   BMI 27.25 kg/m     Wt Readings from Last 3 Encounters:  03/21/20 149 lb (67.6 kg)  01/22/20 160 lb 12.8 oz (72.9 kg)  01/09/20 150 lb (68 kg)     GEN: Patient is in no acute distress HEENT: Normal NECK: No JVD; No carotid bruits LYMPHATICS: No lymphadenopathy CARDIAC: Hear sounds regular, 2/6 systolic murmur at the apex. RESPIRATORY:  Clear to auscultation without rales, wheezing or rhonchi  ABDOMEN: Soft, non-tender, non-distended MUSCULOSKELETAL:  No edema; No deformity  SKIN: Warm and dry NEUROLOGIC:  Alert and oriented x 3 PSYCHIATRIC:  Normal affect   Signed, [name], MD  03/21/2020 3:36 PM    [facility]

## 2020-03-21 NOTE — Patient Instructions (Signed)
Medication Instructions:  Your physician has recommended you make the following change in your medication:   Take 0.5 mg Chantix twice daily.  *If you need a refill on your cardiac medications before your next appointment, please call your pharmacy*   Lab Work: None ordered If you have labs (blood work) drawn today and your tests are completely normal, you will receive your results only by: Marland Kitchen MyChart Message (if you have MyChart) OR . A paper copy in the mail If you have any lab test that is abnormal or we need to change your treatment, we will call you to review the results.   Testing/Procedures: None ordered   Follow-Up: At [facility], you and your health needs are our priority.  As part of our continuing mission to provide you with exceptional heart care, we have created designated Provider Care Teams.  These Care Teams include your primary Cardiologist (physician) and Advanced Practice Providers (APPs -  Physician Assistants and Nurse Practitioners) who all work together to provide you with the care you need, when you need it.  We recommend signing up for the patient portal called "MyChart".  Sign up information is provided on this After Visit Summary.  MyChart is used to connect with patients for Virtual Visits (Telemedicine).  Patients are able to view lab/test results, encounter notes, upcoming appointments, etc.  Non-urgent messages can be sent to your provider as well.   To learn more about what you can do with MyChart, go to [email or web address].    Your next appointment:   6 month(s)  The format for your next appointment:   In Person  Provider:   Jyl Heinz, MD   Other Instructions Varenicline oral tablets What is this medicine? VARENICLINE (var e NI kleen) is used to help people quit smoking. It is used with a patient support program recommended by your physician. This medicine may be used for other purposes; ask your health care provider or pharmacist  if you have questions. COMMON BRAND NAME(S): Chantix What should I tell my health care provider before I take this medicine? They need to know if you have any of these conditions:  heart disease  if you often drink alcohol  kidney disease  mental illness  on hemodialysis  seizures  history of stroke  suicidal thoughts, plans, or attempt; a previous suicide attempt by you or a family member  an unusual or allergic reaction to varenicline, other medicines, foods, dyes, or preservatives  pregnant or trying to get pregnant  breast-feeding How should I use this medicine? Take this medicine by mouth after eating. Take with a full glass of water. Follow the directions on the prescription label. Take your doses at regular intervals. Do not take your medicine more often than directed. There are 3 ways you can use this medicine to help you quit smoking; talk to your health care professional to decide which plan is right for you: 1) you can choose a quit date and start this medicine 1 week before the quit date, or, 2) you can start taking this medicine before you choose a quit date, and then pick a quit date between day 8 and 35 days of treatment, or, 3) if you are not sure that you are able or willing to quit smoking right away, start taking this medicine and slowly decrease the amount you smoke as directed by your health care professional with the goal of being cigarette-free by week 12 of treatment. Stick to your plan; ask about support  groups or other ways to help you remain cigarette-free. If you are motivated to quit smoking and did not succeed during a previous attempt with this medicine for reasons other than side effects, or if you returned to smoking after this treatment, speak with your health care professional about whether another course of this medicine may be right for you. A special MedGuide will be given to you by the pharmacist with each prescription and refill. Be sure to  read this information carefully each time. Talk to your pediatrician regarding the use of this medicine in children. This medicine is not approved for use in children. Overdosage: If you think you have taken too much of this medicine contact a poison control center or emergency room at once. NOTE: This medicine is only for you. Do not share this medicine with others. What if I miss a dose? If you miss a dose, take it as soon as you can. If it is almost time for your next dose, take only that dose. Do not take double or extra doses. What may interact with this medicine?  alcohol  insulin  other medicines used to help people quit smoking  theophylline  warfarin This list may not describe all possible interactions. Give your health care provider a list of all the medicines, herbs, non-prescription drugs, or dietary supplements you use. Also tell them if you smoke, drink alcohol, or use illegal drugs. Some items may interact with your medicine. What should I watch for while using this medicine? It is okay if you do not succeed at your attempt to quit and have a cigarette. You can still continue your quit attempt and keep using this medicine as directed. Just throw away your cigarettes and get back to your quit plan. Talk to your health care provider before using other treatments to quit smoking. Using this medicine with other treatments to quit smoking may increase the risk for side effects compared to using a treatment alone. You may get drowsy or dizzy. Do not drive, use machinery, or do anything that needs mental alertness until you know how this medicine affects you. Do not stand or sit up quickly, especially if you are an older patient. This reduces the risk of dizzy or fainting spells. Decrease the number of alcoholic beverages that you drink during treatment with this medicine until you know if this medicine affects your ability to tolerate alcohol. Some people have experienced increased  drunkenness (intoxication), unusual or sometimes aggressive behavior, or no memory of things that have happened (amnesia) during treatment with this medicine. Sleepwalking can happen during treatment with this medicine, and can sometimes lead to behavior that is harmful to you, other people, or property. Stop taking this medicine and tell your doctor if you start sleepwalking or have other unusual sleep-related activity. After taking this medicine, you may get up out of bed and do an activity that you do not know you are doing. The next morning, you may have no memory of this. Activities include driving a car ("sleep-driving"), making and eating food, talking on the phone, sexual activity, and sleep-walking. Serious injuries have occurred. Stop the medicine and call your doctor right away if you find out you have done any of these activities. Do not take this medicine if you have used alcohol that evening. Do not take it if you have taken another medicine for sleep. The risk of doing these sleep-related activities is higher. Patients and their families should watch out for new or worsening depression  or thoughts of suicide. Also watch out for sudden changes in feelings such as feeling anxious, agitated, panicky, irritable, hostile, aggressive, impulsive, severely restless, overly excited and hyperactive, or not being able to sleep. If this happens, call your health care professional. If you have diabetes and you quit smoking, the effects of insulin may be increased and you may need to reduce your insulin dose. Check with your doctor or health care professional about how you should adjust your insulin dose. What side effects may I notice from receiving this medicine? Side effects that you should report to your doctor or health care professional as soon as possible:  allergic reactions like skin rash, itching or hives, swelling of the face, lips, tongue, or throat  acting aggressive, being angry or violent,  or acting on dangerous impulses  breathing problems  changes in emotions or moods  chest pain or chest tightness  feeling faint or lightheaded, falls  hallucination, loss of contact with reality  mouth sores  redness, blistering, peeling or loosening of the skin, including inside the mouth  signs and symptoms of a stroke like changes in vision; confusion; trouble speaking or understanding; severe headaches; sudden numbness or weakness of the face, arm or leg; trouble walking; dizziness; loss of balance or coordination  seizures  sleepwalking  suicidal thoughts or other mood changes Side effects that usually do not require medical attention (report to your doctor or health care professional if they continue or are bothersome):  constipation  gas  headache  nausea, vomiting  strange dreams  trouble sleeping This list may not describe all possible side effects. Call your doctor for medical advice about side effects. You may report side effects to FDA at 1-800-FDA-1088. Where should I keep my medicine? Keep out of the reach of children. Store at room temperature between 15 and 30 degrees C (59 and 86 degrees F). Throw away any unused medicine after the expiration date. NOTE: This sheet is a summary. It may not cover all possible information. If you have questions about this medicine, talk to your doctor, pharmacist, or health care provider.  2020 Elsevier/Gold Standard (2018-04-02 14:27:36)

## 2020-07-11 DIAGNOSIS — R202 Paresthesia of skin: Secondary | ICD-10-CM | POA: Insufficient documentation

## 2020-07-11 DIAGNOSIS — H538 Other visual disturbances: Secondary | ICD-10-CM | POA: Insufficient documentation

## 2020-07-11 HISTORY — DX: Paresthesia of skin: R20.2

## 2020-07-11 HISTORY — DX: Other visual disturbances: H53.8

## 2020-07-18 DIAGNOSIS — J189 Pneumonia, unspecified organism: Secondary | ICD-10-CM | POA: Insufficient documentation

## 2020-07-18 DIAGNOSIS — A419 Sepsis, unspecified organism: Secondary | ICD-10-CM

## 2020-07-18 DIAGNOSIS — R509 Fever, unspecified: Secondary | ICD-10-CM

## 2020-07-18 DIAGNOSIS — I959 Hypotension, unspecified: Secondary | ICD-10-CM

## 2020-07-18 DIAGNOSIS — M1990 Unspecified osteoarthritis, unspecified site: Secondary | ICD-10-CM | POA: Insufficient documentation

## 2020-07-18 HISTORY — DX: Sepsis, unspecified organism: A41.9

## 2020-07-18 HISTORY — DX: Fever, unspecified: R50.9

## 2020-07-18 HISTORY — DX: Hypotension, unspecified: I95.9

## 2020-08-08 DIAGNOSIS — D171 Benign lipomatous neoplasm of skin and subcutaneous tissue of trunk: Secondary | ICD-10-CM | POA: Insufficient documentation

## 2020-08-08 HISTORY — DX: Benign lipomatous neoplasm of skin and subcutaneous tissue of trunk: D17.1

## 2020-08-29 DIAGNOSIS — G609 Hereditary and idiopathic neuropathy, unspecified: Secondary | ICD-10-CM

## 2020-08-29 HISTORY — DX: Hereditary and idiopathic neuropathy, unspecified: G60.9

## 2020-09-03 DIAGNOSIS — K589 Irritable bowel syndrome without diarrhea: Secondary | ICD-10-CM

## 2020-09-03 HISTORY — DX: Irritable bowel syndrome, unspecified: K58.9

## 2020-09-04 DIAGNOSIS — D1722 Benign lipomatous neoplasm of skin and subcutaneous tissue of left arm: Secondary | ICD-10-CM

## 2020-09-04 HISTORY — DX: Benign lipomatous neoplasm of skin and subcutaneous tissue of left arm: D17.22

## 2020-09-10 ENCOUNTER — Other Ambulatory Visit: Payer: Self-pay | Admitting: Obstetrics & Gynecology

## 2020-09-10 DIAGNOSIS — Z13820 Encounter for screening for osteoporosis: Secondary | ICD-10-CM

## 2020-09-10 DIAGNOSIS — F17200 Nicotine dependence, unspecified, uncomplicated: Secondary | ICD-10-CM

## 2020-09-14 ENCOUNTER — Other Ambulatory Visit: Payer: Self-pay

## 2020-09-17 ENCOUNTER — Ambulatory Visit: Payer: BC Managed Care – PPO | Admitting: Cardiology

## 2020-10-22 DIAGNOSIS — E109 Type 1 diabetes mellitus without complications: Secondary | ICD-10-CM | POA: Insufficient documentation

## 2020-10-22 HISTORY — DX: Type 1 diabetes mellitus without complications: E10.9

## 2020-11-09 DIAGNOSIS — R748 Abnormal levels of other serum enzymes: Secondary | ICD-10-CM

## 2020-11-09 DIAGNOSIS — R1011 Right upper quadrant pain: Secondary | ICD-10-CM

## 2020-11-09 HISTORY — DX: Abnormal levels of other serum enzymes: R74.8

## 2020-11-09 HISTORY — DX: Right upper quadrant pain: R10.11

## 2020-11-12 ENCOUNTER — Ambulatory Visit: Payer: Self-pay | Admitting: Cardiology

## 2020-12-14 DIAGNOSIS — R102 Pelvic and perineal pain: Secondary | ICD-10-CM

## 2020-12-14 HISTORY — DX: Pelvic and perineal pain: R10.2

## 2021-01-08 ENCOUNTER — Ambulatory Visit: Payer: Self-pay | Admitting: Cardiology

## 2021-02-05 ENCOUNTER — Other Ambulatory Visit: Payer: Self-pay

## 2021-02-07 ENCOUNTER — Encounter: Payer: Self-pay | Admitting: Cardiology

## 2021-02-07 ENCOUNTER — Ambulatory Visit (INDEPENDENT_AMBULATORY_CARE_PROVIDER_SITE_OTHER): Payer: BC Managed Care – PPO | Admitting: Cardiology

## 2021-02-07 ENCOUNTER — Other Ambulatory Visit: Payer: Self-pay

## 2021-02-07 VITALS — BP 124/68 | HR 77 | Ht 62.0 in | Wt 149.0 lb

## 2021-02-07 DIAGNOSIS — E088 Diabetes mellitus due to underlying condition with unspecified complications: Secondary | ICD-10-CM

## 2021-02-07 DIAGNOSIS — E782 Mixed hyperlipidemia: Secondary | ICD-10-CM

## 2021-02-07 DIAGNOSIS — I25118 Atherosclerotic heart disease of native coronary artery with other forms of angina pectoris: Secondary | ICD-10-CM | POA: Diagnosis not present

## 2021-02-07 DIAGNOSIS — F17210 Nicotine dependence, cigarettes, uncomplicated: Secondary | ICD-10-CM

## 2021-02-07 NOTE — Patient Instructions (Signed)

## 2021-02-07 NOTE — Progress Notes (Signed)
Cardiology Office Note:    Date:  02/07/2021   ID:  [name], DOB 1960/09/21, MRN [id number]  PCP:  [name], MD  Cardiologist:  [name], MD   Referring MD: [name], MD    ASSESSMENT:    1. Coronary artery disease of native artery of native heart with stable angina pectoris ([place])   2. Mixed hyperlipidemia   3. Cigarette smoker   4. Diabetes mellitus due to underlying condition with unspecified complications ([place])    PLAN:    In order of problems listed above:  Coronary artery disease: Secondary prevention stressed with the patient.  Importance of compliance with diet medication stressed and she vocalized understanding.  She was advised to walk at least half an hour a day 5 days a week and she promises to do so. Essential hypertension: Blood pressure stable and diet was emphasized.  Lifestyle modification urged. Mixed dyslipidemia: Lipids were reviewed and are markedly elevated.  They are followed by primary care as she has issues with LFTs also.  Currently she is taking full dose statin.  Diet was emphasized. Diabetes mellitus: Managed by primary care.  She has continuous blood sugar monitoring machine and is monitoring her sugars carefully with the assistance of her physicians. Cigarette smoker: I spent 5 minutes with the patient discussing solely about smoking. Smoking cessation was counseled. I suggested to the patient also different medications and pharmacological interventions. Patient is keen to try stopping on its own at this time. He will get back to me if he needs any further assistance in this matter. Patient will be seen in follow-up appointment in 6 months or earlier if the patient has any concerns    Medication Adjustments/Labs and Tests Ordered: Current medicines are reviewed at length with the patient today.  Concerns regarding medicines are outlined above.  Orders Placed This Encounter  Procedures   EKG 12-Lead    No orders of the  defined types were placed in this encounter.    No chief complaint on file.    History of Present Illness:    [name] is a 60 y.o. female.  Patient has past medical history of coronary artery disease, essential hypertension, dyslipidemia and diabetes mellitus.  She leads a sedentary lifestyle.  Unfortunately she continues to smoke.  Her liver tests and lipids are managed by primary care.  She denies any chest pain orthopnea or PND.  At the time of my evaluation, the patient is alert awake oriented and in no distress.  Past Medical History:  Diagnosis Date   Abnormal thyroid function test 12/06/2015   Acute bilateral thoracic back pain 12/03/2018   Acute left ankle pain 03/09/2020   Alcohol use 04/15/2019   Formatting of this note might be different from the original. Recommend abstinence from alcohol   Anxiety disorder 06/05/2015   Last Assessment & Plan:  Formatting of this note might be different from the original. Had started her on some xanax about week ago and she feels it has helped her and she is taking about half tablet for this and will continue with it as she has been compliant with it   Arteriosclerotic vascular disease 11/06/2017   Arthritis    back    Atherosclerosis 06/05/2015   Formatting of this note might be different from the original. Seen on CTs of chest.   Atherosclerosis of native coronary artery of native heart without angina pectoris 02/11/2016   Attention deficit 12/04/2017   Blurred  vision 07/11/2020   BMI 27.0-27.9,adult 07/11/2015   Last Assessment & Plan:  Formatting of this note might be different from the original. Relevant Hx: Course: Daily Update: Today's Plan:difficult for her with the insulin as well as her abilify and the weight is increaseing for her rather than decreasing, she has cut her alcohol intake back, she has worked on her diet more with decreasing her carb intake as well and still difficulty discussed with    Bursitis of both hips  04/14/2018   CAD (coronary artery disease) 12/24/2017   Chest pain 11/06/2017   Chronic bronchitis ([place]) 12/04/2015   Formatting of this note might be different from the original. PFT ratio 88% FEV1 103% FVC 94% DLCO 76%  Last Assessment & Plan:  Formatting of this note might be different from the original. We discussed this as she has been coughing again for about 2 mnths and she sounds congested on exam with previous ease for pneumonia will place her on levaquin and have reviewed with her the importance of her    Cigarette smoker 02/26/2016   Coronary arteriosclerosis 12/24/2017   Coronary artery disease of native artery of native heart with stable angina pectoris ([place]) 01/25/2016   Formatting of this note might be different from the original. 2017:  50% LAD lesion.  Calcium noted other places.  Heart cath December 31, 2017 with a 30% ostial LAD lesion otherwise normal coronary arteries seen with LVEF 55 to 65%   Deficiency of other specified B group vitamins 11/07/2016   Degeneration of lumbar intervertebral disc 11/06/2017   Diabetes mellitus due to underlying condition with unspecified complications ([place]) 54/27/0623   Diabetes mellitus type 1, uncomplicated ([place]) 76/28/3151   Added automatically from request for surgery [id number]  Formatting of this note might be different from the original. Added automatically from request for surgery [id number]   Diabetes mellitus without complication ([place])    Type 1- injections only- recent change to [place] with improved A1C reading.   Dysphagia 11/04/2017   Dyspnea on exertion 12/04/2017   Elevated alkaline phosphatase level 11/09/2020   Elevated LFTs 03/10/2019   Formatting of this note might be different from the original. History.  Cannot change to this.   Erythrocytosis 12/04/2017   Facial tingling 07/11/2020   Fatigue 11/06/2017   Fever 69/48/5462   Folic acid deficiency 70/35/0093   Fusion of lumbar spine 01/11/2020   HAP (hospital-acquired  pneumonia) 01/13/2020   Hemangioma 11/06/2017   History of metabolic disorder 81/82/9937   History of pneumonia 01/25/2020   Formatting of this note might be different from the original. Recent mutifocal pneumonia September 2021 Formatting of this note might be different from the original. Formatting of this note might be different from the original. Recent mutifocal pneumonia September 2021   History of sepsis 01/25/2020   Formatting of this note might be different from the original. Recent septic syndrome with multifocal pneumonia Formatting of this note might be different from the original. Formatting of this note might be different from the original. Recent septic syndrome with multifocal pneumonia   HNP (herniated nucleus pulposus), lumbar 05/10/2014   Hypotension 07/18/2020   Idiopathic peripheral neuropathy 08/29/2020   Insulin long-term use ([place]) 11/06/2017   Intermittent claudication ([place]) 12/04/2017   Irritable bowel syndrome 09/03/2020   Left foot pain 01/28/2018   Lipoma of abdominal wall 08/08/2020   Lipoma of left upper extremity 09/04/2020   Low back pain 10/22/2019   Lumbar post-laminectomy syndrome 03/04/2019   Lumbar radiculopathy  05/26/2017   Lung mass 12/04/2017   Malaise and fatigue 11/06/2017   Menopausal disorder 11/06/2017   Mixed hyperlipidemia 04/09/2016   Formatting of this note might be different from the original. Added automatically from request for surgery [id number]   Moderate chronic obstructive pulmonary disease ([place]) 11/06/2017   Multiple actinic keratoses 11/06/2017   Myositis 12/04/2017   Nicotine dependence, cigarettes, uncomplicated 84/13/2440   Obsessive-compulsive disorder 10/14/2019   Formatting of this note might be different from the original. Formerly followed with [facility].   Overweight with body mass index (BMI) 25.0-29.9 07/11/2015   Last Assessment & Plan:  Relevant Hx: Course: Daily Update: Today's Plan:difficult for her with the insulin  as well as her abilify and the weight is increaseing for her rather than decreasing, she has cut her alcohol intake back, she has worked on her diet more with decreasing her carb intake as well and still difficulty discussed with the saxenda and will try to get this approved for her and see    Pain in both lower extremities 07/10/2015   Last Assessment & Plan:  Formatting of this note might be different from the original. Relevant Hx: Course: Daily Update: Today's Plan:she had pain to her right anterior thigh there is edema of both legs and she has fear for DVT which she is going to have [place] of and confirm ok, she has increased her weight which is part of her edema I believe as well as her insulin usage. She is advised as well tha   Pain of left hip joint 07/23/2018   Pelvic pain 12/14/2020   Personal history of (healed) traumatic fracture 12/31/2016   Pneumonia    Polyarthralgia 06/11/2018   Primary insomnia 06/05/2015   Primary osteoarthritis involving multiple joints 08/24/2019   Pure hypercholesterolemia 02/11/2016   Purpura ([place]) 11/06/2017   Respiratory failure ([place]) 11/2018   RUQ pain 11/09/2020   Sciatica 11/03/2017   Scoliosis deformity of spine 11/03/2017   Secondary diabetes mellitus ([place]) 12/24/2017   Senile purpura ([place]) 12/04/2017   Sepsis ([place]) 07/18/2020   Smoking greater than 30 pack years 02/26/2016   Tobacco abuse 02/11/2016   Tobacco user 02/11/2016   Trichotillomania    hx.    Trigger thumb of right hand 02/22/2535   Uncomplicated type 1 diabetes mellitus ([place]) 10/22/2020    Past Surgical History:  Procedure Laterality Date   ABDOMINAL EXPOSURE N/A 01/11/2020   Procedure: ABDOMINAL EXPOSURE;  Surgeon: Marty Heck, MD;  Location: [facility] OR;  Service: Vascular;  Laterality: N/A;   ANTERIOR LUMBAR FUSION N/A 01/11/2020   Procedure: ANTERIOR LUMBAR INTERBODY FUSION  LUMBAR FIVE-SACRAL ONE, LEFT LUMBAR TWO-THREE FORAMINOTOMY, DISECTOMY;  Surgeon: Melina Schools, MD;   Location: [facility];  Service: Orthopedics;  Laterality: N/A;   APPENDECTOMY  04/28/1970   BACK SURGERY  2016   CARDIAC CATHETERIZATION  12/31/2017   COLONOSCOPY  04/28/2017   EXCISION/RELEASE BURSA HIP Left 04/14/2018   Procedure: Excision trochanteric bursa left hip;  Surgeon: Susa Day, MD;  Location: [facility] ORS;  Service: Orthopedics;  Laterality: Left;  60 mins   HAND SURGERY     HIP OPEN REDUCTION Right    ORIF   KNEE ARTHROSCOPY     LEFT HEART CATH AND CORONARY ANGIOGRAPHY N/A 12/31/2017   Procedure: LEFT HEART CATH AND CORONARY ANGIOGRAPHY;  Surgeon: Martinique, Peter M, MD;  Location: [facility];  Service: Cardiovascular;  Laterality: N/A;   LUMBAR LAMINECTOMY/DECOMPRESSION MICRODISCECTOMY Left 05/10/2014   Procedure: MICRODISCECTOMY LUMBAR DECOMPRESSION  L5-S1 LEFT ;  Surgeon: Johnn Hai, MD;  Location: [facility] OR[facility];  Service: Orthopedics;  Laterality: Left;   LUMBAR LAMINECTOMY/DECOMPRESSION MICRODISCECTOMY N/A 01/11/2020   Procedure: LUMBAR LAMINECTOMY/DECOMPRESSION MICRODISCECTOMY LEFT LUMBAR TWO-THREE;  Surgeon: Melina Schools, MD;  Location: [facility];  Service: Orthopedics;  Laterality: N/A;   ORIF WRIST FRACTURE Right    retained hardware   ORTHOPAEDIC SURGERY  04/14/2018   TUBAL LIGATION  12/25/1995    Current Medications: Current Meds  Medication Sig   ALPRAZolam (XANAX) 0.5 MG tablet Take 0.5 mg by mouth at bedtime.    atorvastatin (LIPITOR) 80 MG tablet Take 1 tablet by mouth daily.   azelastine (ASTELIN) 0.1 % nasal spray Place 2 sprays into both nostrils 2 (two) times daily.   cyanocobalamin (,VITAMIN B-12,) 1000 MCG/ML injection Inject 1,000 mcg into the muscle every 14 (fourteen) days.   cyclobenzaprine (FLEXERIL) 10 MG tablet Take 10 mg by mouth 3 (three) times daily.   finasteride (PROSCAR) 5 MG tablet Take 2.5 mg by mouth daily.   FLUoxetine (PROZAC) 40 MG capsule Take 80 mg by mouth daily.    folic acid (FOLVITE) 1 MG tablet Take 1 mg by mouth at bedtime.     insulin aspart (NOVOLOG FLEXPEN) 100 UNIT/ML FlexPen Inject 3-4 Units into the skin 3 (three) times daily as needed for high blood sugar (blood sugar above 200).   insulin degludec (TRESIBA FLEXTOUCH) 200 UNIT/ML FlexTouch Pen Inject 34 Units into the skin daily.    linaclotide (LINZESS) 290 MCG CAPS capsule Take 290 mcg by mouth at bedtime.    nitroGLYCERIN (NITROSTAT) 0.4 MG SL tablet Place 0.4 mg under the tongue every 5 (five) minutes as needed for chest pain.   pantoprazole (PROTONIX) 40 MG tablet Take 40 mg by mouth daily.     Allergies:   Celebrex [celecoxib] and Mobic [meloxicam]   Social History   Socioeconomic History   Marital status: Divorced    Spouse name: Not on file   Number of children: Not on file   Years of education: Not on file   Highest education level: Not on file  Occupational History   Not on file  Tobacco Use   Smoking status: Some Days    Packs/day: 0.50    Years: 30.00    Pack years: 15.00    Types: Cigarettes   Smokeless tobacco: Never  Vaping Use   Vaping Use: Never used  Substance and Sexual Activity   Alcohol use: Yes    Comment: weekends - social   Drug use: No   Sexual activity: Not Currently  Other Topics Concern   Not on file  Social History Narrative   Not on file   Social Determinants of Health   Financial Resource Strain: Not on file  Food Insecurity: Not on file  Transportation Needs: Not on file  Physical Activity: Not on file  Stress: Not on file  Social Connections: Not on file     Family History: The patient's family history includes Cancer in her mother; Diabetes in her father and mother; Heart disease in her father; Hypertension in her father.  ROS:   Please see the history of present illness.    All other systems reviewed and are negative.  EKGs/Labs/Other Studies Reviewed:    The following studies were reviewed today: I discussed my findings with the patient at length.  CT coronary angiography from last year was  reviewed.   Recent Labs: No results found for requested labs within last 8760  hours.  Recent Lipid Panel    Component Value Date/Time   CHOL 186 10/11/2019 0950   TRIG 136 10/11/2019 0950   HDL 49 10/11/2019 0950   CHOLHDL 3.8 10/11/2019 0950   LDLCALC 113 (H) 10/11/2019 0950    Physical Exam:    VS:  BP 124/68   Pulse 77   Ht 5\' 2"  (1.575 m)   Wt 149 lb (67.6 kg)   LMP 05/04/2008   SpO2 96%   BMI 27.25 kg/m     Wt Readings from Last 3 Encounters:  02/07/21 149 lb (67.6 kg)  03/21/20 149 lb (67.6 kg)  01/22/20 160 lb 12.8 oz (72.9 kg)     GEN: Patient is in no acute distress HEENT: Normal NECK: No JVD; No carotid bruits LYMPHATICS: No lymphadenopathy CARDIAC: Hear sounds regular, 2/6 systolic murmur at the apex. RESPIRATORY:  Clear to auscultation without rales, wheezing or rhonchi  ABDOMEN: Soft, non-tender, non-distended MUSCULOSKELETAL:  No edema; No deformity  SKIN: Warm and dry NEUROLOGIC:  Alert and oriented x 3 PSYCHIATRIC:  Normal affect   Signed, [name], MD  02/07/2021 1:45 PM     [facility]

## 2021-03-07 ENCOUNTER — Telehealth: Payer: Self-pay | Admitting: Cardiology

## 2021-03-07 DIAGNOSIS — G90529 Complex regional pain syndrome I of unspecified lower limb: Secondary | ICD-10-CM | POA: Insufficient documentation

## 2021-03-07 HISTORY — DX: Complex regional pain syndrome i of unspecified lower limb: G90.529

## 2021-03-07 NOTE — Telephone Encounter (Signed)
Pt is wanting to get an omni pod insulin delivery system. Pt states that this requires a prescription from her Dr., Merryl Hacker doesn't want to write it as they are not familiar with this product.. pt is wanting to know if this is something Dr. Geraldo Pitter can do... please advise

## 2021-03-08 NOTE — Telephone Encounter (Signed)
Per DPR detailed message left on VM with lrecommendations as reviewed by Dr. Geraldo Pitter.

## 2021-03-20 ENCOUNTER — Ambulatory Visit
Admission: RE | Admit: 2021-03-20 | Discharge: 2021-03-20 | Disposition: A | Discharge status: Home / Self Care | Payer: BC Managed Care – PPO | Source: Ambulatory Visit | Attending: Obstetrics & Gynecology | Admitting: Obstetrics & Gynecology

## 2021-03-20 ENCOUNTER — Other Ambulatory Visit: Payer: Self-pay

## 2021-03-20 DIAGNOSIS — Z13820 Encounter for screening for osteoporosis: Secondary | ICD-10-CM

## 2021-03-20 DIAGNOSIS — F17200 Nicotine dependence, unspecified, uncomplicated: Secondary | ICD-10-CM

## 2021-05-02 ENCOUNTER — Telehealth: Payer: Self-pay | Admitting: Cardiology

## 2021-05-02 ENCOUNTER — Other Ambulatory Visit: Payer: Self-pay

## 2021-05-02 ENCOUNTER — Ambulatory Visit: Payer: BC Managed Care – PPO | Admitting: Cardiology

## 2021-05-02 ENCOUNTER — Encounter: Payer: Self-pay | Admitting: Cardiology

## 2021-05-02 VITALS — BP 112/82 | HR 75 | Ht 62.0 in | Wt 153.6 lb

## 2021-05-02 DIAGNOSIS — Z720 Tobacco use: Secondary | ICD-10-CM

## 2021-05-02 DIAGNOSIS — R079 Chest pain, unspecified: Secondary | ICD-10-CM | POA: Diagnosis not present

## 2021-05-02 DIAGNOSIS — E088 Diabetes mellitus due to underlying condition with unspecified complications: Secondary | ICD-10-CM

## 2021-05-02 DIAGNOSIS — I25118 Atherosclerotic heart disease of native coronary artery with other forms of angina pectoris: Secondary | ICD-10-CM | POA: Diagnosis not present

## 2021-05-02 MED ORDER — METOPROLOL TARTRATE 25 MG PO TABS: 25.0000 mg | ORAL_TABLET | Freq: Two times a day (BID) | ORAL | 1 refills | Status: DC

## 2021-05-02 MED ORDER — NITROGLYCERIN 0.4 MG SL SUBL
0.4000 mg | SUBLINGUAL_TABLET | SUBLINGUAL | 11 refills | Status: AC | PRN
Start: 2021-05-02 — End: ?

## 2021-05-02 MED ORDER — ASPIRIN EC 81 MG PO TBEC: 81.0000 mg | DELAYED_RELEASE_TABLET | Freq: Every day | ORAL | 3 refills | Status: DC

## 2021-05-02 NOTE — Patient Instructions (Signed)
Medication Instructions:  Your physician has recommended you make the following change in your medication:   TAKE AS NEEDED: Nitroglycerin 0.4 mg sublingual (under your tongue) as needed for chest pain. If experiencing chest pain, stop what you are doing and sit down. Take 1 nitroglycerin and wait 5 minutes. If chest pain continues, take another nitroglycerin and wait 5 minutes. If chest pain does not subside, take 1 more nitroglycerin and dial 911. You make take a total of 3 nitroglycerin in a 15 minute time frame.    START: Aspirin 81 mg daily   START: Metoprolol tartrate 25 mg twice daily   *If you need a refill on your cardiac medications before your next appointment, please call your pharmacy*   Lab Work: Your physician recommends that you return for lab work today at [facility] : Troponin, bmp, cbc   If you have labs (blood work) drawn today and your tests are completely normal, you will receive your results only by: [place] (if you have MyChart) OR A paper copy in the mail If you have any lab test that is abnormal or we need to change your treatment, we will call you to review the results.   Testing/Procedures:  [place] [id number] Dept: [phone] Loc: [phone]  [name]  05/02/2021  You are scheduled for a Cardiac Catheterization on Monday, January 9 with Dr. Harrell Gave End.  1. Please arrive at the [facility] (Main Entrance A) at [facility]: [place], [place] [address] at 6:00 AM (This time is two hours before your procedure to ensure your preparation). Free valet parking service is available.   Special note: Every effort is made to have your procedure done on time. Please understand that emergencies sometimes delay scheduled procedures.  2. Diet: Do not eat solid foods after midnight.  The patient may have clear  liquids until 5am upon the day of the procedure.  3. Labs: You will have lab work drawn  today.   4. Medication instructions in preparation for your procedure:   Contrast Allergy: No      HOLD Tresiba the morning of the procedure HOLD Novolog the morning of the procedure     On the morning of your procedure, take your Aspirin and any morning medicines NOT listed above.  You may use sips of water.  5. Plan for one night stay--bring personal belongings. 6. Bring a current list of your medications and current insurance cards. 7. You MUST have a responsible person to drive you home. 8. Someone MUST be with you the first 24 hours after you arrive home or your discharge will be delayed. 9. Please wear clothes that are easy to get on and off and wear slip-on shoes.  Thank you for allowing [place] to care for you!   -- [name]    Follow-Up: At [facility], you and your health needs are our priority.  As part of our continuing mission to provide you with exceptional heart care, we have created designated Provider Care Teams.  These Care Teams include your primary Cardiologist (physician) and Advanced Practice Providers (APPs -  Physician Assistants and Nurse Practitioners) who all work together to provide you with the care you need, when you need it.  We recommend signing up for the patient portal called "MyChart".  Sign up information is provided on this After Visit Summary.  MyChart is used to connect with patients for Virtual Visits (Telemedicine).  Patients are able to view lab/test results, encounter notes, upcoming appointments, etc.  Non-urgent messages can be sent to your provider as well.   To learn more about what you can do with MyChart, go to [email or web address].    Your next appointment:   1 month(s)  The format for your next appointment:   In Person  Provider:   Jyl Heinz, MD    Other Instructions  Coronary Angiogram  With Stent Coronary angiogram with stent placement is a procedure to widen or open a narrow blood vessel of the heart (coronary artery). Arteries may become blocked by cholesterol buildup (plaques) in the lining of the artery wall. When a coronary artery becomes partially blocked, blood flow to that area decreases. This may lead to chest pain or a heart attack (myocardial infarction). A stent is a small piece of metal that looks like mesh or spring. Stent placement may be done as treatment after a heart attack, or to prevent a heart attack if a blocked artery is found by a coronary angiogram. Let your health care provider know about: Any allergies you have, including allergies to medicines or contrast dye. All medicines you are taking, including vitamins, herbs, eye drops, creams, and over-the-counter medicines. Any problems you or family members have had with anesthetic medicines. Any blood disorders you have. Any surgeries you have had. Any medical conditions you have, including kidney problems or kidney failure. Whether you are pregnant or may be pregnant. Whether you are breastfeeding. What are the risks? Generally, this is a safe procedure. However, serious problems may occur, including: Damage to nearby structures or organs, such as the heart, blood vessels, or kidneys. A return of blockage. Bleeding, infection, or bruising at the insertion site. A collection of blood under the skin (hematoma) at the insertion site. A blood clot in another part of the body. Allergic reaction to medicines or dyes. Bleeding into the abdomen (retroperitoneal bleeding). Stroke (rare). Heart attack (rare). What happens before the procedure? Staying hydrated Follow instructions from your health care provider about hydration, which may include: Up to 2 hours before the procedure - you may continue to drink clear liquids, such as water, clear fruit juice, black coffee, and plain tea.  Eating and drinking  restrictions Follow instructions from your health care provider about eating and drinking, which may include: 8 hours before the procedure - stop eating heavy meals or foods, such as meat, fried foods, or fatty foods. 6 hours before the procedure - stop eating light meals or foods, such as toast or cereal. 2 hours before the procedure - stop drinking clear liquids. Medicines Ask your health care provider about: Changing or stopping your regular medicines. This is especially important if you are taking diabetes medicines or blood thinners. Taking medicines such as aspirin and ibuprofen. These medicines can thin your blood. Do not take these medicines unless your health care provider tells you to take them. Generally, aspirin is recommended before a thin tube, called a catheter, is passed through a blood vessel and inserted into the heart (cardiac catheterization). Taking over-the-counter medicines, vitamins, herbs, and supplements. General instructions Do not use any products that contain nicotine or tobacco for at least 4 weeks before the procedure. These products include cigarettes, e-cigarettes, and chewing tobacco. If you need help quitting, ask your health care provider. Plan to have someone take you home from the hospital or clinic. If you will be going home  right after the procedure, plan to have someone with you for 24 hours. You may have tests and imaging procedures. Ask your health care provider: How your insertion site will be marked. Ask which artery will be used for the procedure. What steps will be taken to help prevent infection. These may include: Removing hair at the insertion site. Washing skin with a germ-killing soap. Taking antibiotic medicine. What happens during the procedure?  An IV will be inserted into one of your veins. Electrodes may be placed on your chest to monitor your heart rate during the procedure. You will be given one or more of the following: A medicine  to help you relax (sedative). A medicine to numb the area (local anesthetic) for catheter insertion. A small incision will be made for catheter insertion. The catheter will be inserted into an artery using a guide wire. The location may be in your groin, your wrist, or the fold of your arm (near your elbow). An X-ray procedure (fluoroscopy) will be used to help guide the catheter to the opening of the heart arteries. A dye will be injected into the catheter. X-rays will be taken. The dye helps to show where any narrowing or blockages are located in the arteries. Tell your health care provider if you have chest pain or trouble breathing. A tiny wire will be guided to the blocked spot, and a balloon will be inflated to make the artery wider. The stent will be expanded to crush the plaques into the wall of the vessel. The stent will hold the area open and improve the blood flow. Most stents have a drug coating to reduce the risk of the stent narrowing over time. The artery may be made wider using a drill, laser, or other tools that remove plaques. The catheter will be removed when the blood flow improves. The stent will stay where it was placed, and the lining of the artery will grow over it. A bandage (dressing) will be placed on the insertion site. Pressure will be applied to stop bleeding. The IV will be removed. This procedure may vary among health care providers and hospitals. What happens after the procedure? Your blood pressure, heart rate, breathing rate, and blood oxygen level will be monitored until you leave the hospital or clinic. If the procedure is done through the leg, you will lie flat in bed for a few hours or for as long as told by your health care provider. You will be instructed not to bend or cross your legs. The insertion site and the pulse in your foot or wrist will be checked often. You may have more blood tests, X-rays, and a test that records the electrical activity of your  heart (electrocardiogram, or ECG). Do not drive for 24 hours if you were given a sedative during your procedure. Summary Coronary angiogram with stent placement is a procedure to widen or open a narrowed coronary artery. This is done to treat heart problems. Before the procedure, let your health care provider know about all the medical conditions and surgeries you have or have had. This is a safe procedure. However, some problems may occur, including damage to nearby structures or organs, bleeding, blood clots, or allergies. Follow your health care provider's instructions about eating, drinking, medicines, and other lifestyle changes, such as quitting tobacco use before the procedure. This information is not intended to replace advice given to you by your health care provider. Make sure you discuss any questions you have with your health  care provider. Document Revised: 11/03/2018 Document Reviewed: 11/03/2018 Elsevier Patient Education  [place].  Metoprolol Tablets What is this medication? METOPROLOL (me TOE proe lole) treats high blood pressure. It also prevents chest pain (angina) or further damage after a heart attack. It works by lowering your blood pressure and heart rate, making it easier for your heart to pump blood to the rest of your body. It belongs to a group of medications called beta blockers. This medicine may be used for other purposes; ask your health care provider or pharmacist if you have questions. COMMON BRAND NAME(S): Lopressor What should I tell my care team before I take this medication? They need to know if you have any of these conditions: Diabetes Heart or vessel disease like slow heart rate, worsening heart failure, heart block, sick sinus syndrome, or Raynaud's disease Kidney disease Liver disease Lung or breathing disease, like asthma or emphysema Pheochromocytoma Thyroid disease An unusual or allergic reaction to metoprolol, other beta blockers,  medications, foods, dyes, or preservatives Pregnant or trying to get pregnant Breast-feeding How should I use this medication? Take this medication by mouth with water. Take it as directed on the prescription label at the same time every day. You can take it with or without food. You should always take it the same way. Keep taking it unless your care team tells you to stop. Talk to your care team about the use of this medication in children. Special care may be needed. Overdosage: If you think you have taken too much of this medicine contact a poison control center or emergency room at once. NOTE: This medicine is only for you. Do not share this medicine with others. What if I miss a dose? If you miss a dose, take it as soon as you can. If it is almost time for your next dose, take only that dose. Do not take double or extra doses. What may interact with this medication? This medication may interact with the following: Certain medications for blood pressure, heart disease, irregular heartbeat Certain medications for depression like monoamine oxidase (MAO) inhibitors, fluoxetine, or paroxetine Clonidine Dobutamine Epinephrine Isoproterenol Reserpine This list may not describe all possible interactions. Give your health care provider a list of all the medicines, herbs, non-prescription drugs, or dietary supplements you use. Also tell them if you smoke, drink alcohol, or use illegal drugs. Some items may interact with your medicine. What should I watch for while using this medication? Visit your care team for regular checks on your progress. Check your blood pressure as directed. Ask your care team what your blood pressure should be. Also, find out when you should contact them. Do not treat yourself for coughs, colds, or pain while you are using this medication without asking your care team for advice. Some medications may increase your blood pressure. You may get drowsy or dizzy. Do not drive,  use machinery, or do anything that needs mental alertness until you know how this medication affects you. Do not stand up or sit up quickly, especially if you are an older patient. This reduces the risk of dizzy or fainting spells. Alcohol may interfere with the effect of this medication. Avoid alcoholic drinks. This medication may increase blood sugar. Ask your care team if changes in diet or medications are needed if you have diabetes. What side effects may I notice from receiving this medication? Side effects that you should report to your care team as soon as possible: Allergic reactions--skin rash, itching, hives,  swelling of the face, lips, tongue, or throat Heart failure--shortness of breath, swelling of the ankles, feet, or hands, sudden weight gain, unusual weakness or fatigue Low blood pressure--dizziness, feeling faint or lightheaded, blurry vision Raynaud's--cool, numb, or painful fingers or toes that may change color from pale, to blue, to red Slow heartbeat--dizziness, feeling faint or lightheaded, confusion, trouble breathing, unusual weakness or fatigue Worsening mood, feelings of depression Side effects that usually do not require medical attention (report to your care team if they continue or are bothersome): Change in sex drive or performance Diarrhea Dizziness Fatigue Headache This list may not describe all possible side effects. Call your doctor for medical advice about side effects. You may report side effects to FDA at 1-800-FDA-1088. Where should I keep my medication? Keep out of the reach of children and pets. Store at room temperature between 15 and 30 degrees C (59 and 86 degrees F). Protect from moisture. Keep the container tightly closed. Throw away any unused medication after the expiration date. NOTE: This sheet is a summary. It may not cover all possible information. If you have questions about this medicine, talk to your doctor, pharmacist, or health care  provider.  2022 Elsevier/Gold Standard (2021-01-01 00:00:00)  Aspirin and Your Heart Aspirin is a medicine that prevents the platelets in your blood from sticking together. Platelets are the cells that your blood uses for clotting. Aspirin can be used to help reduce the risk of blood clots, heart attacks, and other heart-related problems. What are the risks? Daily use of aspirin can cause side effects. Some of these include: Bleeding. Bleeding can be minor or serious. An example of minor bleeding is bleeding from a cut, and the bleeding does not stop. An example of more serious bleeding is stomach bleeding or, rarely, bleeding into the brain. Your risk of bleeding increases if you are also taking NSAIDs, such as ibuprofen. Increased bruising. Upset stomach. An allergic reaction. People who have growths inside the nose (nasal polyps) have an increased risk of developing an aspirin allergy. How to use aspirin to care for your heart  Take aspirin only as told by your health care provider. Make sure that you understand how much to take and what form to take. The two forms of aspirin are: Non-enteric-coated.This type of aspirin does not have a coating and is absorbed quickly. This type of aspirin also comes in a chewable form. Enteric-coated. This type of aspirin has a coating that releases the medicine very slowly. Enteric-coated aspirin might cause less stomach upset than non-enteric-coated aspirin. This type of aspirin should not be chewed or crushed. Work with your health care provider to find out whether it is safe and beneficial for you to take aspirin daily. Taking aspirin daily may be helpful if: You have had a heart attack or chest pain, or you are at risk for a heart attack. You have a condition in which certain heart vessels are blocked (coronary artery disease), and you have had a procedure to treat it. Examples are: Open-heart surgery, such as coronary artery bypass surgery  (CABG). Coronary angioplasty,which is done to widen a blood vessel of your heart. Having a small mesh tube, or stent, placed in your coronary artery. You have had certain types of stroke or a mini-stroke known as a transient ischemic attack (TIA). You have a narrowing of the arteries that supply the limbs (peripheral vascular disease, or PVD). You have long-term (chronic) heart rhythm problems, such as atrial fibrillation, and your health care  provider thinks aspirin may help. You have valve disease, have had a heart valve replacement, or have had surgery on a valve. You are considered at increased risk of developing coronary artery disease or PVD. Follow these instructions at home Medicines Take over-the-counter and prescription medicines only as told by your health care provider. If you are taking blood thinners: Talk with your health care provider before you take any medicines that contain aspirin or NSAIDs, such as ibuprofen. These medicines increase your risk for dangerous bleeding. Take your medicine exactly as told, at the same time every day. Avoid activities that could cause injury or bruising, and follow instructions about how to prevent falls. Wear a medical alert bracelet or carry a card that lists what medicines you take. General instructions Do not drink alcohol if: Your health care provider tells you not to drink. You are pregnant, may be pregnant, or are planning to become pregnant. If you drink alcohol: Limit how much you have to: 0-1 drink a day for women. 0-2 drinks a day for men. Know how much alcohol is in your drink. In the [place] one drink equals one 12 oz bottle of beer (355 mL), one 5 oz glass of wine (148 mL), or one 1 oz glass of hard liquor (44 mL). Keep all follow-up visits. This is important. Where to find more information The American Heart Association: www.heart.org The Centers for Disease Control and Prevention: http://www.wolf.info/ Contact a health care provider  if: You have unusual bleeding or bruising. You have stomach pain or you feel nauseous. You have ringing in your ears. You have an allergic reaction that causes hives, itchy skin, or swelling of the lips, tongue, or face. Get help right away if: Your bowel movements are bloody, dark red, or black. You vomit or cough up blood. You have blood in your urine. You have a cough, make high-pitched whistling sounds most often heard when you breathe out (wheeze), or feel short of breath. You have chest pain, especially if the pain spreads to your arms, back, neck, or jaw. You have any symptoms of a stroke. "BE FAST" is an easy way to remember the main warning signs of a stroke: B - Balance. Signs are dizziness, sudden trouble walking, or loss of balance. E - Eyes. Signs are trouble seeing or a sudden change in vision. F - Face. Signs are sudden weakness or numbness of the face, or the face or eyelid drooping on one side. A - Arms. Signs are weakness or numbness in an arm. This happens suddenly and usually on one side of the body. S - Speech. Signs are sudden trouble speaking, slurred speech, or trouble understanding what people say. T - Time. Time to call emergency services. Write down what time symptoms started. You have other signs of a stroke, such as: A sudden, severe headache with no known cause. Confusion. Nausea or vomiting. Seizure. These symptoms may represent a serious problem that is an emergency. Do not wait to see if the symptoms will go away. Get medical help right away. Call your local emergency services (911 in the [place] Do not drive yourself to the hospital. Summary Aspirin use can help reduce the risk of blood clots, heart attacks, and other heart-related problems. Daily use of aspirin can cause side effects. Take aspirin only as told by your health care provider. Make sure that you understand how much to take and what form to take. Your health care provider will help you  determine whether it is  safe and beneficial for you to take aspirin daily. This information is not intended to replace advice given to you by your health care provider. Make sure you discuss any questions you have with your health care provider. Document Revised: 06/16/2020 Document Reviewed: 06/16/2020 Elsevier Patient Education  [place].  Nitroglycerin Sublingual Tablets What is this medication? NITROGLYCERIN (nye troe GLI ser in) prevents and treats chest pain (angina). It works by relaxing blood vessels, which decreases the amount of work the heart has to do. It belongs to a group of medications called nitrates. This medicine may be used for other purposes; ask your health care provider or pharmacist if you have questions. COMMON BRAND NAME(S): Nitroquick, Nitrostat, Nitrotab What should I tell my care team before I take this medication? They need to know if you have any of these conditions: Anemia Head injury, recent stroke, or bleeding in the brain Liver disease Previous heart attack An unusual or allergic reaction to nitroglycerin, other medications, foods, dyes, or preservatives Pregnant or trying to get pregnant Breast-feeding How should I use this medication? Take this medication by mouth as needed. Use at the first sign of an angina attack (chest pain or tightness). You can also take this medication 5 to 10 minutes before an event likely to produce chest pain. Follow the directions exactly as written on the prescription label. Place one tablet under your tongue and let it dissolve. Do not swallow whole. Replace the dose if you accidentally swallow it. It will help if your mouth is not dry. Saliva around the tablet will help it to dissolve more quickly. Do not eat or drink, smoke or chew tobacco while a tablet is dissolving. Sit down when taking this medication. In an angina attack, you should feel better within 5 minutes after your first dose. You can take a dose every 5  minutes up to a total of 3 doses. If you do not feel better or feel worse after 1 dose, call 9-1-1 at once. Do not take more than 3 doses in 15 minutes. Your care team might give you other directions. Follow those directions if they do. Do not take your medication more often than directed. Talk to your care team about the use of this medication in children. Special care may be needed. Overdosage: If you think you have taken too much of this medicine contact a poison control center or emergency room at once. NOTE: This medicine is only for you. Do not share this medicine with others. What if I miss a dose? This does not apply. This medication is only used as needed. What may interact with this medication? Do not take this medication with any of the following: Certain migraine medications like ergotamine and dihydroergotamine (DHE) Medications used to treat erectile dysfunction like sildenafil, tadalafil, and vardenafil Riociguat This medication may also interact with the following: Alteplase Aspirin Heparin Medications for high blood pressure Medications for mental depression Other medications used to treat angina Phenothiazines like chlorpromazine, mesoridazine, prochlorperazine, thioridazine This list may not describe all possible interactions. Give your health care provider a list of all the medicines, herbs, non-prescription drugs, or dietary supplements you use. Also tell them if you smoke, drink alcohol, or use illegal drugs. Some items may interact with your medicine. What should I watch for while using this medication? Tell your care team if you feel your medication is no longer working. Keep this medication with you at all times. Sit or lie down when you take your medication  to prevent falling if you feel dizzy or faint after using it. Try to remain calm. This will help you to feel better faster. If you feel dizzy, take several deep breaths and lie down with your feet propped up, or  bend forward with your head resting between your knees. You may get drowsy or dizzy. Do not drive, use machinery, or do anything that needs mental alertness until you know how this medication affects you. Do not stand or sit up quickly, especially if you are an older patient. This reduces the risk of dizzy or fainting spells. Alcohol can make you more drowsy and dizzy. Avoid alcoholic drinks. Do not treat yourself for coughs, colds, or pain while you are taking this medication without asking your care team for advice. Some ingredients may increase your blood pressure. What side effects may I notice from receiving this medication? Side effects that you should report to your care team as soon as possible: Allergic reactions--skin rash, itching, hives, swelling of the face, lips, tongue, or throat Headache, unusual weakness or fatigue, shortness of breath, nausea, vomiting, rapid heartbeat, blue skin or lips, which may be signs of methemoglobinemia Increased pressure around the brain--severe headache, blurry vision, change in vision, nausea, vomiting Low blood pressure--dizziness, feeling faint or lightheaded, blurry vision Slow heartbeat--dizziness, feeling faint or lightheaded, confusion, trouble breathing, unusual weakness or fatigue Worsening chest pain (angina)--pain, pressure, or tightness in the chest, neck, back, or arms Side effects that usually do not require medical attention (report to your care team if they continue or are bothersome): Dizziness Flushing Headache This list may not describe all possible side effects. Call your doctor for medical advice about side effects. You may report side effects to FDA at 1-800-FDA-1088. Where should I keep my medication? Keep out of the reach of children. Store at room temperature between 20 and 25 degrees C (68 and 77 degrees F). Store in [facility]. Protect from light and moisture. Keep tightly closed. Throw away any unused medication after  the expiration date. NOTE: This sheet is a summary. It may not cover all possible information. If you have questions about this medicine, talk to your doctor, pharmacist, or health care provider.  2022 Elsevier/Gold Standard (2020-07-24 00:00:00)

## 2021-05-02 NOTE — Telephone Encounter (Signed)
Call sent straight to triage. Patient complaining of chest pressure tightness that woke her up this morning around 3:00 am. Patient stated she took nitroglycerin and it helped. Patient is not having any chest pain at this time. Patient is concerned. Made patient an appointment to see Dr. Agustin Cree today, first available appointment. Encouraged patient is chest pain comes back to take nitroglycerin and if that does not help, she can take it again 5 minutes apart up to 3 tablets. Informed patient that if she is taking the second nitro that she needs to go to ED or call 911. Patient verbalized understanding.

## 2021-05-02 NOTE — Progress Notes (Signed)
Cardiology Office Note:    Date:  05/02/2021   ID:  [name], DOB 1960-08-04, MRN [id number]  PCP:  [name], MD  Cardiologist:  Jenne Campus, MD    Referring MD: [name], MD   Chief Complaint  Patient presents with   Chest Pain    History of Present Illness:    [name] is a 61 y.o. female with past medical history significant for coronary artery disease.  In 2019 she did have cardiac catheterization performed which showed 30% stenosis of the ostial LAD.  Otherwise there was normal anatomy.  In summer 2021 she got coronary CT angio performed which showed coronary calcium score 253 she was find to have moderate 50 to 69% stenosis of the proximal LAD.  To look like fractional flow reserve was not performed after that.  She does have history of diabetes, dyslipidemia, she is a chronic smoker, she did not take an aspirin. She requested to be seen today because yesterday almost all day she got heavy sensation in the chest it was in the middle chest she graded this as 6 in scale up to 10 on and off but majority of time was there.  There was no shortness of breath there was no sweating associated with this sensation adventure this sensation went away she was able to sleep through the night but she woke up at 3:00 in the morning with much worse sensation of squeezing in the chest.  She graded the sensation as 9 in scale up to 10 she got up took nitroglycerin and that sensation went away.  After that she did well in the matter of fact she was able to do some housecleaning today.  She came to our office.  I did monitor my interview she is completely asymptomatic.  Her EKG did not show any changes.  Past Medical History:  Diagnosis Date   Abnormal thyroid function test 12/06/2015   Acute bilateral thoracic back pain 12/03/2018   Acute left ankle pain 03/09/2020   Alcohol use 04/15/2019   Formatting of this note might be different from the original. Recommend abstinence  from alcohol   Anxiety disorder 06/05/2015   Last Assessment & Plan:  Formatting of this note might be different from the original. Had started her on some xanax about week ago and she feels it has helped her and she is taking about half tablet for this and will continue with it as she has been compliant with it   Arteriosclerotic vascular disease 11/06/2017   Arthritis    back    Atherosclerosis 06/05/2015   Formatting of this note might be different from the original. Seen on CTs of chest.   Atherosclerosis of native coronary artery of native heart without angina pectoris 02/11/2016   Attention deficit 12/04/2017   Blurred vision 07/11/2020   BMI 27.0-27.9,adult 07/11/2015   Last Assessment & Plan:  Formatting of this note might be different from the original. Relevant Hx: Course: Daily Update: Today's Plan:difficult for her with the insulin as well as her abilify and the weight is increaseing for her rather than decreasing, she has cut her alcohol intake back, she has worked on her diet more with decreasing her carb intake as well and still difficulty discussed with    Bursitis of both hips 04/14/2018   CAD (coronary artery disease) 12/24/2017   Chest pain 11/06/2017   Chronic bronchitis ([place]) 12/04/2015   Formatting of this note might be different from the  original. PFT ratio 88% FEV1 103% FVC 94% DLCO 76%  Last Assessment & Plan:  Formatting of this note might be different from the original. We discussed this as she has been coughing again for about 2 mnths and she sounds congested on exam with previous ease for pneumonia will place her on levaquin and have reviewed with her the importance of her    Cigarette smoker 02/26/2016   Coronary arteriosclerosis 12/24/2017   Coronary artery disease of native artery of native heart with stable angina pectoris ([place]) 01/25/2016   Formatting of this note might be different from the original. 2017:  50% LAD lesion.  Calcium noted other places.  Heart  cath December 31, 2017 with a 30% ostial LAD lesion otherwise normal coronary arteries seen with LVEF 55 to 65%   Deficiency of other specified B group vitamins 11/07/2016   Degeneration of lumbar intervertebral disc 11/06/2017   Diabetes mellitus due to underlying condition with unspecified complications ([place]) 62/69/4854   Diabetes mellitus type 1, uncomplicated ([place]) 62/70/3500   Added automatically from request for surgery [id number]  Formatting of this note might be different from the original. Added automatically from request for surgery [id number]   Diabetes mellitus without complication ([place])    Type 1- injections only- recent change to [place] with improved A1C reading.   Dysphagia 11/04/2017   Dyspnea on exertion 12/04/2017   Elevated alkaline phosphatase level 11/09/2020   Elevated LFTs 03/10/2019   Formatting of this note might be different from the original. History.  Cannot change to this.   Erythrocytosis 12/04/2017   Facial tingling 07/11/2020   Fatigue 11/06/2017   Fever 96/78/9381   Folic acid deficiency 01/75/1025   Fusion of lumbar spine 01/11/2020   HAP (hospital-acquired pneumonia) 01/13/2020   Hemangioma 11/06/2017   History of metabolic disorder 85/27/7824   History of pneumonia 01/25/2020   Formatting of this note might be different from the original. Recent mutifocal pneumonia September 2021 Formatting of this note might be different from the original. Formatting of this note might be different from the original. Recent mutifocal pneumonia September 2021   History of sepsis 01/25/2020   Formatting of this note might be different from the original. Recent septic syndrome with multifocal pneumonia Formatting of this note might be different from the original. Formatting of this note might be different from the original. Recent septic syndrome with multifocal pneumonia   HNP (herniated nucleus pulposus), lumbar 05/10/2014   Hypotension 07/18/2020   Idiopathic peripheral  neuropathy 08/29/2020   Insulin long-term use ([place]) 11/06/2017   Intermittent claudication ([place]) 12/04/2017   Irritable bowel syndrome 09/03/2020   Left foot pain 01/28/2018   Lipoma of abdominal wall 08/08/2020   Lipoma of left upper extremity 09/04/2020   Low back pain 10/22/2019   Lumbar post-laminectomy syndrome 03/04/2019   Lumbar radiculopathy 05/26/2017   Lung mass 12/04/2017   Malaise and fatigue 11/06/2017   Menopausal disorder 11/06/2017   Mixed hyperlipidemia 04/09/2016   Formatting of this note might be different from the original. Added automatically from request for surgery [id number]   Moderate chronic obstructive pulmonary disease ([place]) 11/06/2017   Multiple actinic keratoses 11/06/2017   Myositis 12/04/2017   Nicotine dependence, cigarettes, uncomplicated 44/31/5400   Obsessive-compulsive disorder 10/14/2019   Formatting of this note might be different from the original. Formerly followed with [facility].   Overweight with body mass index (BMI) 25.0-29.9 07/11/2015   Last Assessment & Plan:  Relevant Hx: Course: Daily Update: Today's Plan:difficult for her  with the insulin as well as her abilify and the weight is increaseing for her rather than decreasing, she has cut her alcohol intake back, she has worked on her diet more with decreasing her carb intake as well and still difficulty discussed with the saxenda and will try to get this approved for her and see    Pain in both lower extremities 07/10/2015   Last Assessment & Plan:  Formatting of this note might be different from the original. Relevant Hx: Course: Daily Update: Today's Plan:she had pain to her right anterior thigh there is edema of both legs and she has fear for DVT which she is going to have [place] of and confirm ok, she has increased her weight which is part of her edema I believe as well as her insulin usage. She is advised as well tha   Pain of left hip joint 07/23/2018   Pelvic pain 12/14/2020   Personal history  of (healed) traumatic fracture 12/31/2016   Pneumonia    Polyarthralgia 06/11/2018   Primary insomnia 06/05/2015   Primary osteoarthritis involving multiple joints 08/24/2019   Pure hypercholesterolemia 02/11/2016   Purpura ([place]) 11/06/2017   Respiratory failure ([place]) 11/2018   RUQ pain 11/09/2020   Sciatica 11/03/2017   Scoliosis deformity of spine 11/03/2017   Secondary diabetes mellitus ([place]) 12/24/2017   Senile purpura ([place]) 12/04/2017   Sepsis ([place]) 07/18/2020   Smoking greater than 30 pack years 02/26/2016   Tobacco abuse 02/11/2016   Tobacco user 02/11/2016   Trichotillomania    hx.    Trigger thumb of right hand 61/95/0932   Uncomplicated type 1 diabetes mellitus ([place]) 10/22/2020    Past Surgical History:  Procedure Laterality Date   ABDOMINAL EXPOSURE N/A 01/11/2020   Procedure: ABDOMINAL EXPOSURE;  Surgeon: Marty Heck, MD;  Location: [facility];  Service: Vascular;  Laterality: N/A;   ANTERIOR LUMBAR FUSION N/A 01/11/2020   Procedure: ANTERIOR LUMBAR INTERBODY FUSION  LUMBAR FIVE-SACRAL ONE, LEFT LUMBAR TWO-THREE FORAMINOTOMY, DISECTOMY;  Surgeon: Melina Schools, MD;  Location: [place];  Service: Orthopedics;  Laterality: N/A;   APPENDECTOMY  04/28/1970   BACK SURGERY  2016   CARDIAC CATHETERIZATION  12/31/2017   COLONOSCOPY  04/28/2017   EXCISION/RELEASE BURSA HIP Left 04/14/2018   Procedure: Excision trochanteric bursa left hip;  Surgeon: Susa Day, MD;  Location: [facility] ORS;  Service: Orthopedics;  Laterality: Left;  60 mins   HAND SURGERY     HIP OPEN REDUCTION Right    ORIF   KNEE ARTHROSCOPY     LEFT HEART CATH AND CORONARY ANGIOGRAPHY N/A 12/31/2017   Procedure: LEFT HEART CATH AND CORONARY ANGIOGRAPHY;  Surgeon: Martinique, Peter M, MD;  Location: [facility];  Service: Cardiovascular;  Laterality: N/A;   LUMBAR LAMINECTOMY/DECOMPRESSION MICRODISCECTOMY Left 05/10/2014   Procedure: MICRODISCECTOMY LUMBAR DECOMPRESSION L5-S1 LEFT ;  Surgeon: Johnn Hai,  MD;  Location: [facility];  Service: Orthopedics;  Laterality: Left;   LUMBAR LAMINECTOMY/DECOMPRESSION MICRODISCECTOMY N/A 01/11/2020   Procedure: LUMBAR LAMINECTOMY/DECOMPRESSION MICRODISCECTOMY LEFT LUMBAR TWO-THREE;  Surgeon: Melina Schools, MD;  Location: [facility];  Service: Orthopedics;  Laterality: N/A;   ORIF WRIST FRACTURE Right    retained hardware   ORTHOPAEDIC SURGERY  04/14/2018   TUBAL LIGATION  12/25/1995    Current Medications: Current Meds  Medication Sig   ALPRAZolam (XANAX) 0.5 MG tablet Take 0.5 mg by mouth at bedtime.    aspirin EC 81 MG tablet Take 1 tablet (81 mg total) by mouth daily.  Swallow whole.   atorvastatin (LIPITOR) 80 MG tablet Take 1 tablet by mouth daily.   cyanocobalamin (,VITAMIN B-12,) 1000 MCG/ML injection Inject 1,000 mcg into the muscle every 14 (fourteen) days.   finasteride (PROSCAR) 5 MG tablet Take 2.5 mg by mouth daily.   FLUoxetine (PROZAC) 40 MG capsule Take 80 mg by mouth daily.    folic acid (FOLVITE) 1 MG tablet Take 1 mg by mouth at bedtime.    insulin aspart (NOVOLOG FLEXPEN) 100 UNIT/ML FlexPen Inject 3-4 Units into the skin 3 (three) times daily as needed for high blood sugar (blood sugar above 200).   insulin degludec (TRESIBA FLEXTOUCH) 200 UNIT/ML FlexTouch Pen Inject 34 Units into the skin daily.    linaclotide (LINZESS) 290 MCG CAPS capsule Take 290 mcg by mouth at bedtime.    metoprolol tartrate (LOPRESSOR) 25 MG tablet Take 1 tablet (25 mg total) by mouth 2 (two) times daily.   pantoprazole (PROTONIX) 40 MG tablet Take 40 mg by mouth daily.   [DISCONTINUED] nitroGLYCERIN (NITROSTAT) 0.4 MG SL tablet Place 0.4 mg under the tongue every 5 (five) minutes as needed for chest pain.     Allergies:   Celebrex [celecoxib] and Mobic [meloxicam]   Social History   Socioeconomic History   Marital status: Divorced    Spouse name: Not on file   Number of children: Not on file   Years of education: Not on file   Highest education level: Not  on file  Occupational History   Not on file  Tobacco Use   Smoking status: Some Days    Packs/day: 0.50    Years: 30.00    Pack years: 15.00    Types: Cigarettes   Smokeless tobacco: Never  Vaping Use   Vaping Use: Never used  Substance and Sexual Activity   Alcohol use: Yes    Comment: weekends - social   Drug use: No   Sexual activity: Not Currently  Other Topics Concern   Not on file  Social History Narrative   Not on file   Social Determinants of Health   Financial Resource Strain: Not on file  Food Insecurity: Not on file  Transportation Needs: Not on file  Physical Activity: Not on file  Stress: Not on file  Social Connections: Not on file     Family History: The patient's family history includes Cancer in her mother; Diabetes in her father and mother; Heart disease in her father; Hypertension in her father. ROS:   Please see the history of present illness.    All 14 point review of systems negative except as described per history of present illness  EKGs/Labs/Other Studies Reviewed:      Recent Labs: No results found for requested labs within last 8760 hours.  Recent Lipid Panel    Component Value Date/Time   CHOL 186 10/11/2019 0950   TRIG 136 10/11/2019 0950   HDL 49 10/11/2019 0950   CHOLHDL 3.8 10/11/2019 0950   LDLCALC 113 (H) 10/11/2019 0950    Physical Exam:    VS:  BP 112/82 (BP Location: Left Arm, Patient Position: Sitting)    Pulse 75    Ht 5\' 2"  (1.575 m)    Wt 153 lb 9.6 oz (69.7 kg)    LMP 05/04/2008    SpO2 95%    BMI 28.09 kg/m     Wt Readings from Last 3 Encounters:  05/02/21 153 lb 9.6 oz (69.7 kg)  02/07/21 149 lb (67.6 kg)  03/21/20 149  lb (67.6 kg)     GEN:  Well nourished, well developed in no acute distress HEENT: Normal NECK: No JVD; No carotid bruits LYMPHATICS: No lymphadenopathy CARDIAC: RRR, no murmurs, no rubs, no gallops RESPIRATORY:  Clear to auscultation without rales, wheezing or rhonchi  ABDOMEN: Soft,  non-tender, non-distended MUSCULOSKELETAL:  No edema; No deformity  SKIN: Warm and dry LOWER EXTREMITIES: no swelling NEUROLOGIC:  Alert and oriented x 3 PSYCHIATRIC:  Normal affect   ASSESSMENT:    1. Chest pain of uncertain etiology   2. Coronary artery disease of native artery of native heart with stable angina pectoris ([place])   3. Diabetes mellitus due to underlying condition with unspecified complications ([place])   4. Tobacco abuse    PLAN:    In order of problems listed above:  Chest pain very worrisome in this lady with known proximal LAD disease.  Her EKG did not show any acute changes and she is asymptomatic today however I am very worried about her and worried about unstable angina and I expressed my concerns to her I wanted her to be admitted to the hospital being put on heparin antiplatelets agents and then perform cardiac catheterization hopefully tomorrow however she declined.  She said that she does not want to go to the hospital we had multiple discussion about this I explained to her was the rational of being in the hospital and monitor her I even asked her regular physician Dr. Geraldo Pitter to talk to her in spite of that she still did not want to be admitted to the hospital..  Therefore we will make arrangements to have cardiac catheterization performed as an outpatient.  She will be given fresh nitroglycerin ask her to start taking aspirin every single day, I will also put her on metoprolol 25 twice daily I told her multiple times if she started having chest pain she needs to go to hospital immediately.  Of course she need to call 911.  We will also send her to laboratory in a hospital today so we can do blood tests have been looking for troponin I if troponin is abnormal she promised me that she will agree to go to hospital.  This is a very difficult scenario I am really worried about this lady Diabetes mellitus: That being followed by primary care physician, I do not see her latest  hemoglobin A1c.   Dyslipidemia: I did review her labs which showed LDL of 124 weeks ago.  She is already on atorvastatin 80.  We will add Zetia to her medical regimen. Smoking: Obviously a problem and I strongly recommended to quit.  Overall very difficult situation lady with multiple risk factors with a very worrisome story she refused to be admitted to the hospital.  We will try to do cardiac cath as quickly as possible as an outpatient   Medication Adjustments/Labs and Tests Ordered: Current medicines are reviewed at length with the patient today.  Concerns regarding medicines are outlined above.  Orders Placed This Encounter  Procedures   DG Chest 2 View   EKG 12-Lead   Medication changes:  Meds ordered this encounter  Medications   aspirin EC 81 MG tablet    Sig: Take 1 tablet (81 mg total) by mouth daily. Swallow whole.    Dispense:  90 tablet    Refill:  3   nitroGLYCERIN (NITROSTAT) 0.4 MG SL tablet    Sig: Place 1 tablet (0.4 mg total) under the tongue every 5 (five) minutes as needed  for chest pain.    Dispense:  25 tablet    Refill:  11   metoprolol tartrate (LOPRESSOR) 25 MG tablet    Sig: Take 1 tablet (25 mg total) by mouth 2 (two) times daily.    Dispense:  180 tablet    Refill:  1    Signed, Park Liter, MD, [facility] 05/02/2021 3:40 PM    [place]

## 2021-05-02 NOTE — Telephone Encounter (Signed)
Pt c/o of Chest Pain: STAT if CP now or developed within 24 hours  1. Are you having CP right now? no  2. Are you experiencing any other symptoms (ex. SOB, nausea, vomiting, sweating)? no  3. How long have you been experiencing CP? Yesterday felt like indigestion and 3 am last night tight heaviness  4. Is your CP continuous or coming and going? Came and went  5. Have you taken Nitroglycerin? Yes   Patient states she had what felt like indigestion yesterday, but last night at around 3 am she started having chest tightness and heaviness. She states she took some nitroglycerin and it eased, but her BP is also higher than normal at 134/84, when it is usually around 100/60.    ?

## 2021-05-03 ENCOUNTER — Telehealth: Payer: Self-pay | Admitting: Cardiology

## 2021-05-03 NOTE — Telephone Encounter (Signed)
° °  Pt is calling to get lab work results, she said she had it done at [facility]

## 2021-05-03 NOTE — Telephone Encounter (Signed)
Spoke to patient. Informed her of results from blood work from [employer] after Dr. Agustin Cree reviewed them. Confirmed she should still have her cath on Monday.

## 2021-05-06 ENCOUNTER — Encounter (HOSPITAL_COMMUNITY)
Admission: RE | Disposition: A | Discharge status: Home / Self Care | Payer: Self-pay | Source: Home / Self Care | Attending: Internal Medicine

## 2021-05-06 ENCOUNTER — Ambulatory Visit (HOSPITAL_COMMUNITY)
Admission: RE | Admit: 2021-05-06 | Discharge: 2021-05-06 | Disposition: A | Discharge status: Home / Self Care | Payer: BC Managed Care – PPO | Attending: Internal Medicine | Admitting: Internal Medicine

## 2021-05-06 DIAGNOSIS — I25110 Atherosclerotic heart disease of native coronary artery with unstable angina pectoris: Secondary | ICD-10-CM | POA: Diagnosis present

## 2021-05-06 DIAGNOSIS — E785 Hyperlipidemia, unspecified: Secondary | ICD-10-CM | POA: Diagnosis not present

## 2021-05-06 DIAGNOSIS — E109 Type 1 diabetes mellitus without complications: Secondary | ICD-10-CM | POA: Diagnosis not present

## 2021-05-06 DIAGNOSIS — I200 Unstable angina: Secondary | ICD-10-CM

## 2021-05-06 HISTORY — DX: Unstable angina: I20.0

## 2021-05-06 HISTORY — PX: CORONARY PRESSURE/FFR STUDY: CATH118243

## 2021-05-06 HISTORY — PX: LEFT HEART CATH AND CORONARY ANGIOGRAPHY: CATH118249

## 2021-05-06 LAB — GLUCOSE, CAPILLARY
Glucose-Capillary: 163 mg/dL — ABNORMAL HIGH (ref 70–99)
Glucose-Capillary: 132 mg/dL — ABNORMAL HIGH (ref 70–99)

## 2021-05-06 LAB — POCT ACTIVATED CLOTTING TIME: Activated Clotting Time: 257 s

## 2021-05-06 SURGERY — LEFT HEART CATH AND CORONARY ANGIOGRAPHY
Anesthesia: LOCAL

## 2021-05-06 MED ORDER — NITROGLYCERIN 1 MG/10 ML FOR IR/CATH LAB
INTRA_ARTERIAL | Status: AC
  Filled 2021-05-06: qty 10

## 2021-05-06 MED ORDER — HEPARIN SODIUM (PORCINE) 1000 UNIT/ML IJ SOLN
INTRAMUSCULAR | Status: AC
  Filled 2021-05-06: qty 10

## 2021-05-06 MED ORDER — SODIUM CHLORIDE 0.9 % IV SOLN: 250.0000 mL | INTRAVENOUS | Status: DC | PRN

## 2021-05-06 MED ORDER — VERAPAMIL HCL 2.5 MG/ML IV SOLN
INTRAVENOUS | Status: DC | PRN
  Administered 2021-05-06: 10 mL via INTRA_ARTERIAL

## 2021-05-06 MED ORDER — HYDRALAZINE HCL 20 MG/ML IJ SOLN: 10.0000 mg | INTRAMUSCULAR | Status: AC | PRN

## 2021-05-06 MED ORDER — SODIUM CHLORIDE 0.9% FLUSH: 3.0000 mL | INTRAVENOUS | Status: DC | PRN

## 2021-05-06 MED ORDER — ACETAMINOPHEN 325 MG PO TABS: 650.0000 mg | ORAL_TABLET | ORAL | Status: DC | PRN

## 2021-05-06 MED ORDER — HEPARIN (PORCINE) IN NACL 1000-0.9 UT/500ML-% IV SOLN
INTRAVENOUS | Status: DC | PRN
  Administered 2021-05-06 (×2): 500 mL

## 2021-05-06 MED ORDER — HEPARIN SODIUM (PORCINE) 1000 UNIT/ML IJ SOLN
INTRAMUSCULAR | Status: DC | PRN
  Administered 2021-05-06 (×2): 3500 [IU] via INTRAVENOUS

## 2021-05-06 MED ORDER — SODIUM CHLORIDE 0.9 % WEIGHT BASED INFUSION
3.0000 mL/kg/h | INTRAVENOUS | Status: AC
  Administered 2021-05-06: 3 mL/kg/h via INTRAVENOUS

## 2021-05-06 MED ORDER — ASPIRIN 81 MG PO CHEW: 81.0000 mg | CHEWABLE_TABLET | ORAL | Status: DC

## 2021-05-06 MED ORDER — SODIUM CHLORIDE 0.9% FLUSH: 3.0000 mL | Freq: Two times a day (BID) | INTRAVENOUS | Status: DC

## 2021-05-06 MED ORDER — FENTANYL CITRATE (PF) 100 MCG/2ML IJ SOLN
INTRAMUSCULAR | Status: DC | PRN
  Administered 2021-05-06 (×2): 12.5 ug via INTRAVENOUS

## 2021-05-06 MED ORDER — LIDOCAINE HCL (PF) 1 % IJ SOLN
INTRAMUSCULAR | Status: DC | PRN
  Administered 2021-05-06: 2 mL

## 2021-05-06 MED ORDER — LIDOCAINE HCL (PF) 1 % IJ SOLN
INTRAMUSCULAR | Status: AC
  Filled 2021-05-06: qty 30

## 2021-05-06 MED ORDER — SODIUM CHLORIDE 0.9 % WEIGHT BASED INFUSION: 1.0000 mL/kg/h | INTRAVENOUS | Status: DC

## 2021-05-06 MED ORDER — ONDANSETRON HCL 4 MG/2ML IJ SOLN: 4.0000 mg | Freq: Four times a day (QID) | INTRAMUSCULAR | Status: DC | PRN

## 2021-05-06 MED ORDER — HEPARIN (PORCINE) IN NACL 1000-0.9 UT/500ML-% IV SOLN
INTRAVENOUS | Status: AC
  Filled 2021-05-06: qty 1000

## 2021-05-06 MED ORDER — NITROGLYCERIN 1 MG/10 ML FOR IR/CATH LAB
INTRA_ARTERIAL | Status: DC | PRN
  Administered 2021-05-06: 100 ug via INTRACORONARY

## 2021-05-06 MED ORDER — MIDAZOLAM HCL 2 MG/2ML IJ SOLN
INTRAMUSCULAR | Status: DC | PRN
  Administered 2021-05-06 (×2): .5 mg via INTRAVENOUS

## 2021-05-06 MED ORDER — MIDAZOLAM HCL 2 MG/2ML IJ SOLN
INTRAMUSCULAR | Status: AC
  Filled 2021-05-06: qty 2

## 2021-05-06 MED ORDER — IOHEXOL 350 MG/ML SOLN
INTRAVENOUS | Status: DC | PRN
  Administered 2021-05-06: 55 mL

## 2021-05-06 MED ORDER — SODIUM CHLORIDE 0.9 % IV SOLN: INTRAVENOUS | Status: AC

## 2021-05-06 MED ORDER — LABETALOL HCL 5 MG/ML IV SOLN: 10.0000 mg | INTRAVENOUS | Status: AC | PRN

## 2021-05-06 MED ORDER — VERAPAMIL HCL 2.5 MG/ML IV SOLN
INTRAVENOUS | Status: AC
  Filled 2021-05-06: qty 2

## 2021-05-06 MED ORDER — FENTANYL CITRATE (PF) 100 MCG/2ML IJ SOLN
INTRAMUSCULAR | Status: AC
  Filled 2021-05-06: qty 2

## 2021-05-06 SURGICAL SUPPLY — 14 items
CATH 5FR JL3.5 JR4 ANG PIG MP (CATHETERS) ×1 IMPLANT
CATH LAUNCHER 5F EBU3.0 (CATHETERS) IMPLANT
CATHETER LAUNCHER 5F EBU3.0 (CATHETERS) ×2
DEVICE RAD COMP TR BAND LRG (VASCULAR PRODUCTS) ×1 IMPLANT
GLIDESHEATH SLEND SS 6F .021 (SHEATH) ×1 IMPLANT
GUIDEWIRE INQWIRE 1.5J.035X260 (WIRE) IMPLANT
GUIDEWIRE PRESSURE X 175 (WIRE) ×1 IMPLANT
INQWIRE 1.5J .035X260CM (WIRE) ×2
KIT ESSENTIALS PG (KITS) ×1 IMPLANT
KIT HEART LEFT (KITS) ×3 IMPLANT
PACK CARDIAC CATHETERIZATION (CUSTOM PROCEDURE TRAY) ×3 IMPLANT
SYR MEDRAD MARK 7 150ML (SYRINGE) ×3 IMPLANT
TRANSDUCER W/STOPCOCK (MISCELLANEOUS) ×3 IMPLANT
TUBING CIL FLEX 10 FLL-RA (TUBING) ×3 IMPLANT

## 2021-05-06 NOTE — H&P (Signed)
Cardiology Admission History and Physical:   Patient ID: [name] MRN: [id number]; DOB: [birth year]   Admission date: 05/06/2021  PCP:  [name], MD   [facility] Providers Cardiologist:  [name], MD    Chief Complaint:  Chest pain  Patient Profile:   [name] is a 61 y.o. female with history of coronary artery disease with mild-moderate proximal LAD disease by catheterization and coronary CTA (most recently 2021), type 1 diabetes mellitus, and chronic bronchitis, who is being seen 05/06/2021 for the evaluation of chest pain.  History of Present Illness:   Ms. Dehnert is a history of coronary artery disease with catheterization in 2019 showing 30% proximal LAD disease.  Coronary CTA in 2021 showed 50-69% proximal LAD disease (CT FFR was not performed).  She had been doing well until last week when she woke up Wednesday night with central chest pain.  She took a sublingual nitroglycerin with resolution of the pain.  She was seen by Dr. Agustin Cree the following day, who recommended cardiac catheterization.  She reports 1 further episode of central chest pain at rest this past weekend that promptly resolved with sublingual nitroglycerin.  She has been able to ambulate without any angina.  She otherwise has been asymptomatic.  She reports compliance with her medications.   Past Medical History:  Diagnosis Date   Abnormal thyroid function test 12/06/2015   Acute bilateral thoracic back pain 12/03/2018   Acute left ankle pain 03/09/2020   Alcohol use 04/15/2019   Formatting of this note might be different from the original. Recommend abstinence from alcohol   Anxiety disorder 06/05/2015   Last Assessment & Plan:  Formatting of this note might be different from the original. Had started her on some xanax about week ago and she feels it has helped her and she is taking about half tablet for this and will continue with it as she has been compliant with it    Arteriosclerotic vascular disease 11/06/2017   Arthritis    back    Atherosclerosis 06/05/2015   Formatting of this note might be different from the original. Seen on CTs of chest.   Atherosclerosis of native coronary artery of native heart without angina pectoris 02/11/2016   Attention deficit 12/04/2017   Blurred vision 07/11/2020   BMI 27.0-27.9,adult 07/11/2015   Last Assessment & Plan:  Formatting of this note might be different from the original. Relevant Hx: Course: Daily Update: Today's Plan:difficult for her with the insulin as well as her abilify and the weight is increaseing for her rather than decreasing, she has cut her alcohol intake back, she has worked on her diet more with decreasing her carb intake as well and still difficulty discussed with    Bursitis of both hips 04/14/2018   CAD (coronary artery disease) 12/24/2017   Chest pain 11/06/2017   Chronic bronchitis ([place]) 12/04/2015   Formatting of this note might be different from the original. PFT ratio 88% FEV1 103% FVC 94% DLCO 76%  Last Assessment & Plan:  Formatting of this note might be different from the original. We discussed this as she has been coughing again for about 2 mnths and she sounds congested on exam with previous ease for pneumonia will place her on levaquin and have reviewed with her the importance of her    Cigarette smoker 02/26/2016   Coronary arteriosclerosis 12/24/2017   Coronary artery disease of native artery of native heart with stable angina pectoris ([place]) 01/25/2016  Formatting of this note might be different from the original. 2017:  50% LAD lesion.  Calcium noted other places.  Heart cath December 31, 2017 with a 30% ostial LAD lesion otherwise normal coronary arteries seen with LVEF 55 to 65%   Deficiency of other specified B group vitamins 11/07/2016   Degeneration of lumbar intervertebral disc 11/06/2017   Diabetes mellitus due to underlying condition with unspecified complications ([place])  62/94/7654   Diabetes mellitus type 1, uncomplicated ([place]) 65/06/5463   Added automatically from request for surgery [id number]  Formatting of this note might be different from the original. Added automatically from request for surgery [id number]   Diabetes mellitus without complication ([place])    Type 1- injections only- recent change to [place] with improved A1C reading.   Dysphagia 11/04/2017   Dyspnea on exertion 12/04/2017   Elevated alkaline phosphatase level 11/09/2020   Elevated LFTs 03/10/2019   Formatting of this note might be different from the original. History.  Cannot change to this.   Erythrocytosis 12/04/2017   Facial tingling 07/11/2020   Fatigue 11/06/2017   Fever 49/44/9675   Folic acid deficiency 91/63/8466   Fusion of lumbar spine 01/11/2020   HAP (hospital-acquired pneumonia) 01/13/2020   Hemangioma 11/06/2017   History of metabolic disorder 59/93/5701   History of pneumonia 01/25/2020   Formatting of this note might be different from the original. Recent mutifocal pneumonia September 2021 Formatting of this note might be different from the original. Formatting of this note might be different from the original. Recent mutifocal pneumonia September 2021   History of sepsis 01/25/2020   Formatting of this note might be different from the original. Recent septic syndrome with multifocal pneumonia Formatting of this note might be different from the original. Formatting of this note might be different from the original. Recent septic syndrome with multifocal pneumonia   HNP (herniated nucleus pulposus), lumbar 05/10/2014   Hypotension 07/18/2020   Idiopathic peripheral neuropathy 08/29/2020   Insulin long-term use ([place]) 11/06/2017   Intermittent claudication ([place]) 12/04/2017   Irritable bowel syndrome 09/03/2020   Left foot pain 01/28/2018   Lipoma of abdominal wall 08/08/2020   Lipoma of left upper extremity 09/04/2020   Low back pain 10/22/2019   Lumbar post-laminectomy  syndrome 03/04/2019   Lumbar radiculopathy 05/26/2017   Lung mass 12/04/2017   Malaise and fatigue 11/06/2017   Menopausal disorder 11/06/2017   Mixed hyperlipidemia 04/09/2016   Formatting of this note might be different from the original. Added automatically from request for surgery [id number]   Moderate chronic obstructive pulmonary disease ([place]) 11/06/2017   Multiple actinic keratoses 11/06/2017   Myositis 12/04/2017   Nicotine dependence, cigarettes, uncomplicated 30/12/2328   Obsessive-compulsive disorder 10/14/2019   Formatting of this note might be different from the original. Formerly followed with [facility].   Overweight with body mass index (BMI) 25.0-29.9 07/11/2015   Last Assessment & Plan:  Relevant Hx: Course: Daily Update: Today's Plan:difficult for her with the insulin as well as her abilify and the weight is increaseing for her rather than decreasing, she has cut her alcohol intake back, she has worked on her diet more with decreasing her carb intake as well and still difficulty discussed with the saxenda and will try to get this approved for her and see    Pain in both lower extremities 07/10/2015   Last Assessment & Plan:  Formatting of this note might be different from the original. Relevant Hx: Course: Daily Update: Today's Plan:she had pain to her  right anterior thigh there is edema of both legs and she has fear for DVT which she is going to have [place] of and confirm ok, she has increased her weight which is part of her edema I believe as well as her insulin usage. She is advised as well tha   Pain of left hip joint 07/23/2018   Pelvic pain 12/14/2020   Personal history of (healed) traumatic fracture 12/31/2016   Pneumonia    Polyarthralgia 06/11/2018   Primary insomnia 06/05/2015   Primary osteoarthritis involving multiple joints 08/24/2019   Pure hypercholesterolemia 02/11/2016   Purpura ([place]) 11/06/2017   Respiratory failure ([place]) 11/2018   RUQ pain 11/09/2020   Sciatica  11/03/2017   Scoliosis deformity of spine 11/03/2017   Secondary diabetes mellitus ([place]) 12/24/2017   Senile purpura ([place]) 12/04/2017   Sepsis ([place]) 07/18/2020   Smoking greater than 30 pack years 02/26/2016   Tobacco abuse 02/11/2016   Tobacco user 02/11/2016   Trichotillomania    hx.    Trigger thumb of right hand 28/41/3244   Uncomplicated type 1 diabetes mellitus ([place]) 10/22/2020    Past Surgical History:  Procedure Laterality Date   ABDOMINAL EXPOSURE N/A 01/11/2020   Procedure: ABDOMINAL EXPOSURE;  Surgeon: Marty Heck, MD;  Location: [facility];  Service: Vascular;  Laterality: N/A;   ANTERIOR LUMBAR FUSION N/A 01/11/2020   Procedure: ANTERIOR LUMBAR INTERBODY FUSION  LUMBAR FIVE-SACRAL ONE, LEFT LUMBAR TWO-THREE FORAMINOTOMY, DISECTOMY;  Surgeon: Melina Schools, MD;  Location: [facility];  Service: Orthopedics;  Laterality: N/A;   APPENDECTOMY  04/28/1970   BACK SURGERY  2016   CARDIAC CATHETERIZATION  12/31/2017   COLONOSCOPY  04/28/2017   EXCISION/RELEASE BURSA HIP Left 04/14/2018   Procedure: Excision trochanteric bursa left hip;  Surgeon: Susa Day, MD;  Location: [facility];  Service: Orthopedics;  Laterality: Left;  60 mins   HAND SURGERY     HIP OPEN REDUCTION Right    ORIF   KNEE ARTHROSCOPY     LEFT HEART CATH AND CORONARY ANGIOGRAPHY N/A 12/31/2017   Procedure: LEFT HEART CATH AND CORONARY ANGIOGRAPHY;  Surgeon: Martinique, Peter M, MD;  Location: [facility];  Service: Cardiovascular;  Laterality: N/A;   LUMBAR LAMINECTOMY/DECOMPRESSION MICRODISCECTOMY Left 05/10/2014   Procedure: MICRODISCECTOMY LUMBAR DECOMPRESSION L5-S1 LEFT ;  Surgeon: Johnn Hai, MD;  Location: [facility];  Service: Orthopedics;  Laterality: Left;   LUMBAR LAMINECTOMY/DECOMPRESSION MICRODISCECTOMY N/A 01/11/2020   Procedure: LUMBAR LAMINECTOMY/DECOMPRESSION MICRODISCECTOMY LEFT LUMBAR TWO-THREE;  Surgeon: Melina Schools, MD;  Location: [facility];  Service: Orthopedics;  Laterality: N/A;   ORIF  WRIST FRACTURE Right    retained hardware   ORTHOPAEDIC SURGERY  04/14/2018   TUBAL LIGATION  12/25/1995     Medications Prior to Admission: Prior to Admission medications   Medication Sig Start Date Hercules Hasler Date Taking? Authorizing Provider  ALPRAZolam Duanne Moron) 0.5 MG tablet Take 0.5 mg by mouth at bedtime.  08/08/19  Yes [provider]  aspirin EC 81 MG tablet Take 1 tablet (81 mg total) by mouth daily. Swallow whole. Patient taking differently: Take 81 mg by mouth every evening. Swallow whole. 05/02/21  Yes Park Liter, MD  atorvastatin (LIPITOR) 80 MG tablet Take 80 mg by mouth every evening. 05/31/20  Yes [provider]  cyanocobalamin (,VITAMIN B-12,) 1000 MCG/ML injection Inject 1,000 mcg into the muscle every 14 (fourteen) days.   Yes [provider]  finasteride (PROSCAR) 5 MG tablet Take 2.5 mg by mouth every evening. 08/21/20  Yes [provider]  FLUoxetine (PROZAC) 40 MG capsule Take 80 mg by mouth every evening. 07/11/19  Yes [provider]  folic acid (FOLVITE) 1 MG tablet Take 1 mg by mouth at bedtime.    Yes [provider]  furosemide (LASIX) 40 MG tablet Take 40 mg by mouth in the morning.   Yes [provider]  insulin aspart (NOVOLOG FLEXPEN) 100 UNIT/ML FlexPen Inject 0-8 Units into the skin 3 (three) times daily with meals. 07/26/18  Yes [provider]  insulin degludec (TRESIBA FLEXTOUCH) 200 UNIT/ML FlexTouch Pen Inject 32 Units into the skin in the morning. 07/19/18  Yes [provider]  linaclotide (LINZESS) 290 MCG CAPS capsule Take 290 mcg by mouth at bedtime.  07/03/17  Yes [provider]  metoprolol tartrate (LOPRESSOR) 25 MG tablet Take 1 tablet (25 mg total) by mouth 2 (two) times daily. 05/02/21 07/31/21 Yes Park Liter, MD  nitroGLYCERIN (NITROSTAT) 0.4 MG SL tablet Place 1 tablet (0.4 mg total) under the tongue every 5 (five) minutes as needed for chest pain. 05/02/21   Yes Park Liter, MD  pantoprazole (PROTONIX) 40 MG tablet Take 40 mg by mouth at bedtime. 01/19/21  Yes [provider]     Allergies:    Allergies  Allergen Reactions   Celebrex [Celecoxib] Swelling    Face swelling   Mobic [Meloxicam] Swelling    Face swelling    Social History:   Social History   Socioeconomic History   Marital status: Divorced    Spouse name: Not on file   Number of children: Not on file   Years of education: Not on file   Highest education level: Not on file  Occupational History   Not on file  Tobacco Use   Smoking status: Some Days    Packs/day: 0.50    Years: 30.00    Pack years: 15.00    Types: Cigarettes   Smokeless tobacco: Never  Vaping Use   Vaping Use: Never used  Substance and Sexual Activity   Alcohol use: Yes    Comment: weekends - social   Drug use: No   Sexual activity: Not Currently  Other Topics Concern   Not on file  Social History Narrative   Not on file   Social Determinants of Health   Financial Resource Strain: Not on file  Food Insecurity: Not on file  Transportation Needs: Not on file  Physical Activity: Not on file  Stress: Not on file  Social Connections: Not on file  Intimate Partner Violence: Not on file    Family History:  The patient's family history includes Cancer in her mother; Diabetes in her father and mother; Heart disease in her father; Hypertension in her father.    ROS:  Ms. Notes some pain in her right groin as well as paresthesias extending into the right leg.  She was scheduled for nerve conduction velocity test today, but they have been deferred on account of today's catheterization.  Otherwise, a 12 system review of systems was performed and was negative except as noted in the HPI.  Physical Exam/Data:   Vitals:   05/06/21 0717 05/06/21 0918  BP: 112/65   Pulse: 73   Resp: 15   Temp: 98.2 F (36.8 C)   TempSrc: Oral   SpO2: 96% 94%  Weight: 66.7 kg   Height: 5\' 2"   (1.575 m)    No intake or output data in the 24 hours ending 05/06/21 6295  Last 3 Weights 05/06/2021 05/02/2021 02/07/2021  Weight (lbs) 147 lb 153 lb 9.6 oz 149 lb  Weight (kg) 66.679 kg 69.673 kg 67.586 kg     Body mass index is 26.89 kg/m.  General:  Well nourished, well developed, in no acute distress. HEENT: normal Neck: no JVD Vascular: No carotid bruits; Distal pulses 2+ bilaterally   Cardiac:  normal S1, S2; RRR; no murmurs, rubs, or gallops Lungs:  clear to auscultation bilaterally, no wheezing, rhonchi or rales  Abd: soft, nontender, no hepatomegaly  Ext: no lower extremity edema Musculoskeletal:  No deformities, BUE and BLE strength normal and equal Skin: warm and dry  Neuro:  CNs 2-12 intact, no focal abnormalities noted Psych:  Normal affect    EKG:  The ECG that was done 05/02/2021 was personally reviewed and demonstrates normal sinus rhythm without abnormality.  Relevant CV Studies:   Laboratory Data:  High Sensitivity Troponin:  No results for input(s): TROPONINIHS in the last 720 hours.    ChemistryNo results for input(s): NA, K, CL, CO2, GLUCOSE, BUN, CREATININE, CALCIUM, MG, GFRNONAA, GFRAA, ANIONGAP in the last 168 hours.  No results for input(s): PROT, ALBUMIN, AST, ALT, ALKPHOS, BILITOT in the last 168 hours. Lipids No results for input(s): CHOL, TRIG, HDL, LABVLDL, LDLCALC, CHOLHDL in the last 168 hours. HematologyNo results for input(s): WBC, RBC, HGB, HCT, MCV, MCH, MCHC, RDW, PLT in the last 168 hours. Thyroid No results for input(s): TSH, FREET4 in the last 168 hours. BNPNo results for input(s): BNP, PROBNP in the last 168 hours.  DDimer No results for input(s): DDIMER in the last 168 hours.   Radiology/Studies:  No results found.   Assessment and Plan:   Accelerating angina: Patient has history of mild-moderate proximal LAD disease and type 1 diabetes mellitus.  She has experienced 2 episodes of typical angina at rest over the last week, concerning  for unstable angina.  I agree with recommendation to proceed with cardiac catheterization and possible PCI.  I reviewed her prior catheterization and CTA.  Disease appears to be very close to the ostium of the LAD with some plaque extending back into the LMCA.  Based on today's findings, we may also need to consider referral for cardiac surgery.  Further recommendations to follow the procedure.  Shared Decision Making/Informed Consent The risks [stroke (1 in 1000), death (1 in 1000), kidney failure [usually temporary] (1 in 500), bleeding (1 in 200), allergic reaction [possibly serious] (1 in 200)], benefits (diagnostic support and management of coronary artery disease) and alternatives of a cardiac catheterization were discussed in detail with Ms. Mannings and she is willing to proceed.   For questions or updates, please contact [place] Please consult [email or web address] for contact info under     Signed, Nelva Bush, MD  05/06/2021 9:24 AM

## 2021-05-06 NOTE — Interval H&P Note (Signed)
History and Physical Interval Note:  05/06/2021 9:29 AM  [name]  has presented today for surgery, with the diagnosis of accelerating angina.  The various methods of treatment have been discussed with the patient and family. After consideration of risks, benefits and other options for treatment, the patient has consented to  Procedure(s): LEFT HEART CATH AND CORONARY ANGIOGRAPHY (N/A) as a surgical intervention.  The patient's history has been reviewed, patient examined, no change in status, stable for surgery.  I have reviewed the patient's chart and labs.  Questions were answered to the patient's satisfaction.    Cath Lab Visit (complete for each Cath Lab visit)  Clinical Evaluation Leading to the Procedure:   ACS: No.  Non-ACS:    Anginal Classification: CCS IV  Anti-ischemic medical therapy: Minimal Therapy (1 class of medications)  Non-Invasive Test Results: No non-invasive testing performed  Prior CABG: No previous CABG  [name]

## 2021-05-07 ENCOUNTER — Encounter (HOSPITAL_COMMUNITY): Payer: Self-pay | Admitting: Internal Medicine

## 2021-05-28 ENCOUNTER — Telehealth: Payer: Self-pay | Admitting: Internal Medicine

## 2021-05-28 NOTE — Telephone Encounter (Signed)
We have never seen her before in the office - am I missing something? She needs to be scheduled for new patient evaluation.

## 2021-05-28 NOTE — Telephone Encounter (Signed)
Fax received from Dr. Sherley Bounds to perform a L4-5 lumbar laminectomy on patient.  Patient needs surgery clearance. Patient was seen on 03/08/2020 and is scheduled for an OV on 06/04/21 at 12 pm with Dr. Shearon Stalls. Office protocol is a risk assessment can be sent to surgeon if patient has been seen in 60 days or less.   Sending to Dr. Shearon Stalls for risk assessment

## 2021-05-29 NOTE — Telephone Encounter (Signed)
Dr. Shearon Stalls, patient is scheduled for consult with you 06/04/2021 and will need risk assessment. Thanks

## 2021-06-03 NOTE — Progress Notes (Signed)
Office Visit Note  Patient: [name]             Date of Birth: [birth year]           MRN: [id number]             PCP: [name], MD Referring: [name], MD Visit Date: 06/04/2021 Occupation: @GUAROCC @  Subjective:  Discussed treatment for osteoporosis.   History of Present Illness: [name] is a 61 y.o. female seen in consultation per request of her PCP for the evaluation of osteoporosis.  Patient states that she had her first DEXA scan in November 2022 which showed osteoporosis.  This is she does not take calcium or vitamin D on a regular basis.  She is not able to exercise much because of her back.  She has history of degenerative disease of lumbar spine for which she had had multiple surgeries including discectomy and lumbar spine fusion.  She is still suffering from lower back pain and radiculopathy.  She has discectomy coming up.  She has history of gastroesophageal reflux.  She had recent endoscopy and was diagnosed with gastritis.  She is currently on pantoprazole.  She has not taken pantoprazole or any antacids in the past.  There is no history of hypothyroidism.  He has taken gabapentin in the past.  She gives history of rib fractures in the past due to to minor trauma.  She also had accidents in the past and had jaw injury requiring multiple crowns.  She has frequent dental issues.  There is no known family history of osteoporosis.  Activities of Daily Living:  Patient reports morning stiffness for 30 minutes.   Patient Reports nocturnal pain.  Difficulty dressing/grooming: Denies Difficulty climbing stairs: Reports Difficulty getting out of chair: Denies Difficulty using hands for taps, buttons, cutlery, and/or writing: Reports  Review of Systems  Constitutional:  Negative for fatigue.  HENT:  Negative for mouth sores, mouth dryness and nose dryness.   Eyes:  Negative for pain, itching and dryness.  Respiratory:  Positive for shortness of breath and  difficulty breathing.   Cardiovascular:  Negative for chest pain and palpitations.  Gastrointestinal:  Negative for blood in stool, constipation and diarrhea.  Endocrine: Negative for increased urination.  Genitourinary:  Negative for difficulty urinating.  Musculoskeletal:  Positive for joint pain, joint pain, myalgias, morning stiffness, muscle tenderness and myalgias. Negative for joint swelling.  Skin:  Negative for color change, rash, redness and sensitivity to sunlight.  Allergic/Immunologic: Negative for susceptible to infections.  Neurological:  Positive for numbness. Negative for dizziness, headaches, memory loss and weakness.  Hematological:  Negative for bruising/bleeding tendency.  Psychiatric/Behavioral:  Negative for confusion.    PMFS History:  Patient Active Problem List   Diagnosis Date Noted   Accelerating angina ([place]) 05/06/2021   Complex regional pain syndrome of lower limb 03/07/2021   Pelvic pain 12/14/2020   RUQ pain 11/09/2020   Elevated alkaline phosphatase level 76/19/5093   Uncomplicated type 1 diabetes mellitus ([place]) 10/22/2020   Lipoma of left upper extremity 09/04/2020   Irritable bowel syndrome 09/03/2020   Idiopathic peripheral neuropathy 08/29/2020   Lipoma of abdominal wall 08/08/2020   Fever 07/18/2020   Hypotension 07/18/2020   Sepsis ([place]) 07/18/2020   Pneumonia 07/18/2020   Arthritis 07/18/2020   Blurred vision 07/11/2020   Facial tingling 07/11/2020   Diabetes mellitus without complication ([place])    Acute left ankle pain 03/09/2020   History of pneumonia 01/25/2020  History of sepsis 01/25/2020   Respiratory failure ([place]) 01/13/2020   HAP (hospital-acquired pneumonia) 01/13/2020   Fusion of lumbar spine 01/11/2020   Trigger thumb of right hand 11/14/2019   Low back pain 10/22/2019   Obsessive-compulsive disorder 10/14/2019   Diabetes mellitus due to underlying condition with unspecified complications ([place]) 05/39/7673   Primary  osteoarthritis involving multiple joints 08/24/2019   Alcohol use 04/15/2019   Elevated LFTs 03/10/2019   Lumbar post-laminectomy syndrome 03/04/2019   Acute bilateral thoracic back pain 12/03/2018   Pain of left hip joint 07/23/2018   Polyarthralgia 06/11/2018   Bursitis of both hips 04/14/2018   Left foot pain 01/28/2018   CAD (coronary artery disease) 12/24/2017   Secondary diabetes mellitus ([place]) 12/24/2017   Coronary arteriosclerosis 12/24/2017   Dyspnea on exertion 12/04/2017   Attention deficit 12/04/2017   Intermittent claudication ([place]) 12/04/2017   History of metabolic disorder 41/93/7902   Lung mass 12/04/2017   Myositis 12/04/2017   Erythrocytosis 12/04/2017   Senile purpura ([place]) 12/04/2017   Chest pain 11/06/2017   Multiple actinic keratoses 11/06/2017   Degeneration of lumbar intervertebral disc 11/06/2017   Fatigue 11/06/2017   Insulin long-term use ([place]) 11/06/2017   Menopausal disorder 11/06/2017   Purpura ([place]) 11/06/2017   Moderate chronic obstructive pulmonary disease ([place]) 40/97/3532   Folic acid deficiency 99/24/2683   Malaise and fatigue 11/06/2017   Arteriosclerotic vascular disease 11/06/2017   Dysphagia 11/04/2017   Sciatica 11/03/2017   Scoliosis deformity of spine 11/03/2017   Lumbar radiculopathy 05/26/2017   Personal history of (healed) traumatic fracture 12/31/2016   Deficiency of other specified B group vitamins 11/07/2016   Mixed hyperlipidemia 04/09/2016   Smoking greater than 30 pack years 02/26/2016   Cigarette smoker 02/26/2016   Nicotine dependence, cigarettes, uncomplicated 41/96/2229   Diabetes mellitus type 1, uncomplicated ([place]) 79/89/2119   Tobacco abuse 02/11/2016   Tobacco user 02/11/2016   Pure hypercholesterolemia 02/11/2016   Atherosclerosis of native coronary artery of native heart without angina pectoris 02/11/2016   Coronary artery disease of native artery of native heart with stable angina pectoris ([place]) 01/25/2016    Abnormal thyroid function test 12/06/2015   Chronic bronchitis ([place]) 12/04/2015   Overweight with body mass index (BMI) 25.0-29.9 07/11/2015   BMI 27.0-27.9,adult 07/11/2015   Pain in both lower extremities 07/10/2015   Anxiety disorder 06/05/2015   Atherosclerosis 06/05/2015   Hemangioma 06/05/2015   Primary insomnia 06/05/2015   Trichotillomania 06/05/2015   HNP (herniated nucleus pulposus), lumbar 05/10/2014    Past Medical History:  Diagnosis Date   Abnormal thyroid function test 12/06/2015   Acute bilateral thoracic back pain 12/03/2018   Acute left ankle pain 03/09/2020   Alcohol use 04/15/2019   Formatting of this note might be different from the original. Recommend abstinence from alcohol   Anxiety disorder 06/05/2015   Last Assessment & Plan:  Formatting of this note might be different from the original. Had started her on some xanax about week ago and she feels it has helped her and she is taking about half tablet for this and will continue with it as she has been compliant with it   Arteriosclerotic vascular disease 11/06/2017   Arthritis    back    Atherosclerosis 06/05/2015   Formatting of this note might be different from the original. Seen on CTs of chest.   Atherosclerosis of native coronary artery of native heart without angina pectoris 02/11/2016   Attention deficit 12/04/2017   Blurred vision 07/11/2020  BMI 27.0-27.9,adult 07/11/2015   Last Assessment & Plan:  Formatting of this note might be different from the original. Relevant Hx: Course: Daily Update: Today's Plan:difficult for her with the insulin as well as her abilify and the weight is increaseing for her rather than decreasing, she has cut her alcohol intake back, she has worked on her diet more with decreasing her carb intake as well and still difficulty discussed with    Bursitis of both hips 04/14/2018   CAD (coronary artery disease) 12/24/2017   Chest pain 11/06/2017   Chronic bronchitis ([place])  12/04/2015   Formatting of this note might be different from the original. PFT ratio 88% FEV1 103% FVC 94% DLCO 76%  Last Assessment & Plan:  Formatting of this note might be different from the original. We discussed this as she has been coughing again for about 2 mnths and she sounds congested on exam with previous ease for pneumonia will place her on levaquin and have reviewed with her the importance of her    Cigarette smoker 02/26/2016   Coronary arteriosclerosis 12/24/2017   Coronary artery disease of native artery of native heart with stable angina pectoris ([place]) 01/25/2016   Formatting of this note might be different from the original. 2017:  50% LAD lesion.  Calcium noted other places.  Heart cath December 31, 2017 with a 30% ostial LAD lesion otherwise normal coronary arteries seen with LVEF 55 to 65%   Deficiency of other specified B group vitamins 11/07/2016   Degeneration of lumbar intervertebral disc 11/06/2017   Diabetes mellitus due to underlying condition with unspecified complications ([place]) 77/41/2878   Diabetes mellitus type 1, uncomplicated ([place]) 67/67/2094   Added automatically from request for surgery [id number]  Formatting of this note might be different from the original. Added automatically from request for surgery [id number]   Diabetes mellitus without complication ([place])    Type 1- injections only- recent change to [place] with improved A1C reading.   Dysphagia 11/04/2017   Dyspnea on exertion 12/04/2017   Elevated alkaline phosphatase level 11/09/2020   Elevated LFTs 03/10/2019   Formatting of this note might be different from the original. History.  Cannot change to this.   Erythrocytosis 12/04/2017   Facial tingling 07/11/2020   Fatigue 11/06/2017   Fever 76/54/6503   Folic acid deficiency 54/65/6812   Fusion of lumbar spine 01/11/2020   HAP (hospital-acquired pneumonia) 01/13/2020   Hemangioma 11/06/2017   History of metabolic disorder 75/17/0017   History of pneumonia  01/25/2020   Formatting of this note might be different from the original. Recent mutifocal pneumonia September 2021 Formatting of this note might be different from the original. Formatting of this note might be different from the original. Recent mutifocal pneumonia September 2021   History of sepsis 01/25/2020   Formatting of this note might be different from the original. Recent septic syndrome with multifocal pneumonia Formatting of this note might be different from the original. Formatting of this note might be different from the original. Recent septic syndrome with multifocal pneumonia   HNP (herniated nucleus pulposus), lumbar 05/10/2014   Hypotension 07/18/2020   Idiopathic peripheral neuropathy 08/29/2020   Insulin long-term use ([place]) 11/06/2017   Intermittent claudication ([place]) 12/04/2017   Irritable bowel syndrome 09/03/2020   Left foot pain 01/28/2018   Lipoma of abdominal wall 08/08/2020   Lipoma of left upper extremity 09/04/2020   Low back pain 10/22/2019   Lumbar post-laminectomy syndrome 03/04/2019   Lumbar radiculopathy 05/26/2017   Lung  mass 12/04/2017   Malaise and fatigue 11/06/2017   Menopausal disorder 11/06/2017   Mixed hyperlipidemia 04/09/2016   Formatting of this note might be different from the original. Added automatically from request for surgery [id number]   Moderate chronic obstructive pulmonary disease ([place]) 11/06/2017   Multiple actinic keratoses 11/06/2017   Myositis 12/04/2017   Nicotine dependence, cigarettes, uncomplicated 16/01/9603   Obsessive-compulsive disorder 10/14/2019   Formatting of this note might be different from the original. Formerly followed with [facility].   Overweight with body mass index (BMI) 25.0-29.9 07/11/2015   Last Assessment & Plan:  Relevant Hx: Course: Daily Update: Today's Plan:difficult for her with the insulin as well as her abilify and the weight is increaseing for her rather than decreasing, she has cut her alcohol intake  back, she has worked on her diet more with decreasing her carb intake as well and still difficulty discussed with the saxenda and will try to get this approved for her and see    Pain in both lower extremities 07/10/2015   Last Assessment & Plan:  Formatting of this note might be different from the original. Relevant Hx: Course: Daily Update: Today's Plan:she had pain to her right anterior thigh there is edema of both legs and she has fear for DVT which she is going to have [place] of and confirm ok, she has increased her weight which is part of her edema I believe as well as her insulin usage. She is advised as well tha   Pain of left hip joint 07/23/2018   Pelvic pain 12/14/2020   Personal history of (healed) traumatic fracture 12/31/2016   Pneumonia    Polyarthralgia 06/11/2018   Primary insomnia 06/05/2015   Primary osteoarthritis involving multiple joints 08/24/2019   Pure hypercholesterolemia 02/11/2016   Purpura ([place]) 11/06/2017   Respiratory failure ([place]) 11/2018   RUQ pain 11/09/2020   Sciatica 11/03/2017   Scoliosis deformity of spine 11/03/2017   Secondary diabetes mellitus ([place]) 12/24/2017   Senile purpura ([place]) 12/04/2017   Sepsis ([place]) 07/18/2020   Smoking greater than 30 pack years 02/26/2016   Tobacco abuse 02/11/2016   Tobacco user 02/11/2016   Trichotillomania    hx.    Trigger thumb of right hand 54/12/8117   Uncomplicated type 1 diabetes mellitus ([place]) 10/22/2020    Family History  Problem Relation Age of Onset   Diabetes Mother    Cancer Mother    Hypertension Father    Heart disease Father    Diabetes Father    Healthy Brother    Healthy Son    Diabetes Son    Healthy Daughter    Past Surgical History:  Procedure Laterality Date   ABDOMINAL EXPOSURE N/A 01/11/2020   Procedure: ABDOMINAL EXPOSURE;  Surgeon: Marty Heck, MD;  Location: [facility];  Service: Vascular;  Laterality: N/A;   ANTERIOR LUMBAR FUSION N/A 01/11/2020   Procedure: ANTERIOR LUMBAR  INTERBODY FUSION  LUMBAR FIVE-SACRAL ONE, LEFT LUMBAR TWO-THREE FORAMINOTOMY, DISECTOMY;  Surgeon: Melina Schools, MD;  Location: [facility];  Service: Orthopedics;  Laterality: N/A;   APPENDECTOMY  04/28/1970   BACK SURGERY  2016   CARDIAC CATHETERIZATION  12/31/2017   COLONOSCOPY  04/28/2017   EXCISION/RELEASE BURSA HIP Left 04/14/2018   Procedure: Excision trochanteric bursa left hip;  Surgeon: Susa Day, MD;  Location: [facility];  Service: Orthopedics;  Laterality: Left;  60 mins   HAND SURGERY     HIP OPEN REDUCTION Right    ORIF   INTRAVASCULAR PRESSURE  WIRE/FFR STUDY N/A 05/06/2021   Procedure: INTRAVASCULAR PRESSURE WIRE/FFR STUDY;  Surgeon: Nelva Bush, MD;  Location: [facility];  Service: Cardiovascular;  Laterality: N/A;   KNEE ARTHROSCOPY     LEFT HEART CATH AND CORONARY ANGIOGRAPHY N/A 12/31/2017   Procedure: LEFT HEART CATH AND CORONARY ANGIOGRAPHY;  Surgeon: Martinique, Peter M, MD;  Location: [facility];  Service: Cardiovascular;  Laterality: N/A;   LEFT HEART CATH AND CORONARY ANGIOGRAPHY N/A 05/06/2021   Procedure: LEFT HEART CATH AND CORONARY ANGIOGRAPHY;  Surgeon: Nelva Bush, MD;  Location: [facility];  Service: Cardiovascular;  Laterality: N/A;   LUMBAR LAMINECTOMY/DECOMPRESSION MICRODISCECTOMY Left 05/10/2014   Procedure: MICRODISCECTOMY LUMBAR DECOMPRESSION L5-S1 LEFT ;  Surgeon: Johnn Hai, MD;  Location: [facility];  Service: Orthopedics;  Laterality: Left;   LUMBAR LAMINECTOMY/DECOMPRESSION MICRODISCECTOMY N/A 01/11/2020   Procedure: LUMBAR LAMINECTOMY/DECOMPRESSION MICRODISCECTOMY LEFT LUMBAR TWO-THREE;  Surgeon: Melina Schools, MD;  Location: [facility];  Service: Orthopedics;  Laterality: N/A;   ORIF WRIST FRACTURE Right    retained hardware   ORTHOPAEDIC SURGERY  04/14/2018   TUBAL LIGATION  12/25/1995   Social History   Social History Narrative   Not on file   Immunization History  Administered Date(s) Administered   Influenza,inj,Quad  PF,6+ Mos 01/21/2016, 12/31/2016   Pneumococcal Conjugate-13 07/23/2017     Objective: Vital Signs: BP 121/78 (BP Location: Right Arm, Patient Position: Sitting, Cuff Size: Large)    Pulse (!) 55    Ht 5\' 2"  (1.575 m)    Wt 147 lb (66.7 kg) Comment: per patient, patient refused to weigh   LMP 05/04/2008    BMI 26.89 kg/m    Physical Exam Vitals and nursing note reviewed.  Constitutional:      Appearance: She is well-developed.  HENT:     Head: Normocephalic and atraumatic.  Eyes:     Conjunctiva/sclera: Conjunctivae normal.  Cardiovascular:     Rate and Rhythm: Normal rate and regular rhythm.     Heart sounds: Normal heart sounds.  Pulmonary:     Effort: Pulmonary effort is normal.     Breath sounds: Normal breath sounds.  Abdominal:     General: Bowel sounds are normal.     Palpations: Abdomen is soft.  Musculoskeletal:     Cervical back: Normal range of motion.  Lymphadenopathy:     Cervical: No cervical adenopathy.  Skin:    General: Skin is warm and dry.     Capillary Refill: Capillary refill takes less than 2 seconds.  Neurological:     Mental Status: She is alert and oriented to person, place, and time.  Psychiatric:        Behavior: Behavior normal.     Musculoskeletal Exam: C-spine was in good range of motion.  She had discomfort range of motion of the lumbar spine.  Shoulder joints, elbow joints, wrist joints, MCPs PIPs and DIPs with good range of motion with no synovitis.  Hip joints, knee joints, ankles, MTPs and PIPs been good range of motion with no synovitis.  CDAI Exam: CDAI Score: -- Patient Global: --; Provider Global: -- Swollen: --; Tender: -- Joint Exam 06/04/2021   No joint exam has been documented for this visit   There is currently no information documented on the homunculus. Go to the Rheumatology activity and complete the homunculus joint exam.  Investigation: No additional findings.  Imaging: CARDIAC CATHETERIZATION  Result Date:  05/06/2021 Conclusions: Mild-moderate distal LMCA and ostial/proximal LAD disease of up  to 40% in the ostial LAD, which is not hemodynamically significant (RFR = 0.91).  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  No significant CAD observed in the LCx or RCA. Normal left ventricular systolic function with upper normal filling pressure. Recommendations: Continue medical therapy for anginal relief and to prevent progression of nonobstructive CAD. Nelva Bush, MD [facility]   Recent Labs: Lab Results  Component Value Date   WBC 9.8 01/21/2020   HGB 9.3 (L) 01/21/2020   PLT 249 01/21/2020   NA 137 01/21/2020   K 4.0 01/21/2020   CL 98 01/21/2020   CO2 29 01/21/2020   GLUCOSE 120 (H) 01/21/2020   BUN 12 01/21/2020   CREATININE 0.54 01/21/2020   BILITOT 1.5 (H) 01/13/2020   ALKPHOS 68 01/13/2020   AST 35 01/13/2020   ALT 31 01/13/2020   PROT 4.4 (L) 01/13/2020   ALBUMIN 2.4 (L) 01/13/2020   CALCIUM 8.7 (L) 01/21/2020   GFRAA >60 01/21/2020    Speciality Comments: No specialty comments available.  Procedures:  No procedures performed Allergies: Celebrex [celecoxib] and Mobic [meloxicam]   Assessment / Plan:     Visit Diagnoses: Age-related osteoporosis without current pathological fracture - DXA 03/20/21: BMD LFN 0.672 with T-score -2.6. -DEXA findings were discussed with the patient at length.  Detailed counsel regarding osteoporosis was provided.  Her labs from epic everywhere were reviewed.  She had normal CBC and BMP in January and also a CMP in December 2022.  Different treatment options and their side effects were discussed at length.  I discussed the option of oral bisphosphonates.  Patient is concerned about the use of oral bisphosphonates with her history of gastroesophageal reflux.  She states she had recent endoscopy which showed gastritis.  She was placed on pantoprazole.  I also discussed the possible use of IV Reclast.  She is concerned that she  had surgery on her jaw and has multiple crowns.  She has ongoing dental issues.  I also discussed option of subcutaneous Prolia.  I have advised her to discuss both medications with her dentist.  If she needs any dental work-up she should try to get it completed before starting any medications.  Risk factors for osteoporosis include chronic smoking, sedentary lifestyle due to degenerative disease of lumbar spine, multiple cortisone injections in the past and ongoing cortisone injections.  She is also taking gabapentin for many years.  She gives history of rib fractures in the past related to minor trauma.  She does not recall family history of osteoporosis.  At this point she would work on reducing the factors contributing to osteoporosis.  Smoking cessation was discussed.  Limiting the use of steroid injections were discussed.  Regular exercise was emphasized.  Use of calcium and vitamin D was discussed.  Plan: Parathyroid hormone, intact (no Ca), VITAMIN D 25 Hydroxy (Vit-D Deficiency, Fractures), TSH, Tissue transglutaminase, IgA, Gliadin antibodies, serum, Serum protein electrophoresis with reflex  History of gastroesophageal reflux (GERD)-she is currently on pantoprazole.  Patient states she had recent endoscopy and was diagnosed with gastritis.  Trigger thumb of right hand - She is [facility].  Trigger thumb, left thumb-she is followed by [facility] and gets cortisone injections.  Lumbar radiculopathy - Status post discectomy L2- L3 and L4-L5.  Patient is currently having lumbar radiculopathy and is scheduled to have L5-S1 discectomy per patient.  She has had multiple cortisone injections in her lumbar spine.  Fusion of lumbar spine - L5-S1  12/2019 by Dr. Rolena Infante  Coronary artery disease of native artery of native heart with stable angina pectoris ([place])  Atherosclerosis  Pure hypercholesterolemia  Diabetes mellitus type 1, uncomplicated ([place])  Moderate chronic obstructive  pulmonary disease ([place])  Multiple actinic keratoses  Senile purpura ([place])  Trichotillomania  History of pneumonia - recurrent pneumonia, last episode 09/21. She was in ICU .  History of sepsis  Nicotine dependence, cigarettes, uncomplicated - 1/2 PPD X 40 years.  Smoking cessation was discussed at length.  Increased risk of osteoporosis with the smoking was also discussed.  History of anxiety  Orders: Orders Placed This Encounter  Procedures   Parathyroid hormone, intact (no Ca)   VITAMIN D 25 Hydroxy (Vit-D Deficiency, Fractures)   TSH   Tissue transglutaminase, IgA   Gliadin antibodies, serum   Serum protein electrophoresis with reflex   No orders of the defined types were placed in this encounter.    Follow-Up Instructions: No follow-ups on file.   Bo Merino, MD  Note - This record has been created using Editor, commissioning.  Chart creation errors have been sought, but may not always  have been located. Such creation errors do not reflect on  the standard of medical care.

## 2021-06-04 ENCOUNTER — Ambulatory Visit (INDEPENDENT_AMBULATORY_CARE_PROVIDER_SITE_OTHER): Payer: BC Managed Care – PPO | Admitting: Rheumatology

## 2021-06-04 ENCOUNTER — Other Ambulatory Visit: Payer: Self-pay

## 2021-06-04 ENCOUNTER — Encounter: Payer: Self-pay | Admitting: Internal Medicine

## 2021-06-04 ENCOUNTER — Encounter: Payer: Self-pay | Admitting: Rheumatology

## 2021-06-04 ENCOUNTER — Ambulatory Visit (INDEPENDENT_AMBULATORY_CARE_PROVIDER_SITE_OTHER): Payer: BC Managed Care – PPO | Admitting: Internal Medicine

## 2021-06-04 VITALS — BP 121/78 | HR 55 | Ht 62.0 in | Wt 147.0 lb

## 2021-06-04 VITALS — BP 118/76 | HR 61 | Temp 98.2°F | Ht 62.0 in | Wt 147.0 lb

## 2021-06-04 DIAGNOSIS — Z8619 Personal history of other infectious and parasitic diseases: Secondary | ICD-10-CM

## 2021-06-04 DIAGNOSIS — J449 Chronic obstructive pulmonary disease, unspecified: Secondary | ICD-10-CM

## 2021-06-04 DIAGNOSIS — M4326 Fusion of spine, lumbar region: Secondary | ICD-10-CM

## 2021-06-04 DIAGNOSIS — M65311 Trigger thumb, right thumb: Secondary | ICD-10-CM

## 2021-06-04 DIAGNOSIS — Z8719 Personal history of other diseases of the digestive system: Secondary | ICD-10-CM | POA: Diagnosis not present

## 2021-06-04 DIAGNOSIS — L570 Actinic keratosis: Secondary | ICD-10-CM

## 2021-06-04 DIAGNOSIS — M5126 Other intervertebral disc displacement, lumbar region: Secondary | ICD-10-CM

## 2021-06-04 DIAGNOSIS — E109 Type 1 diabetes mellitus without complications: Secondary | ICD-10-CM

## 2021-06-04 DIAGNOSIS — M810 Age-related osteoporosis without current pathological fracture: Secondary | ICD-10-CM

## 2021-06-04 DIAGNOSIS — M65312 Trigger thumb, left thumb: Secondary | ICD-10-CM

## 2021-06-04 DIAGNOSIS — F5101 Primary insomnia: Secondary | ICD-10-CM

## 2021-06-04 DIAGNOSIS — M5416 Radiculopathy, lumbar region: Secondary | ICD-10-CM

## 2021-06-04 DIAGNOSIS — Z8701 Personal history of pneumonia (recurrent): Secondary | ICD-10-CM

## 2021-06-04 DIAGNOSIS — Z01811 Encounter for preprocedural respiratory examination: Secondary | ICD-10-CM | POA: Diagnosis not present

## 2021-06-04 DIAGNOSIS — D692 Other nonthrombocytopenic purpura: Secondary | ICD-10-CM

## 2021-06-04 DIAGNOSIS — I7090 Unspecified atherosclerosis: Secondary | ICD-10-CM

## 2021-06-04 DIAGNOSIS — I25118 Atherosclerotic heart disease of native coronary artery with other forms of angina pectoris: Secondary | ICD-10-CM

## 2021-06-04 DIAGNOSIS — F17210 Nicotine dependence, cigarettes, uncomplicated: Secondary | ICD-10-CM | POA: Diagnosis not present

## 2021-06-04 DIAGNOSIS — E7800 Pure hypercholesterolemia, unspecified: Secondary | ICD-10-CM

## 2021-06-04 DIAGNOSIS — Z8659 Personal history of other mental and behavioral disorders: Secondary | ICD-10-CM

## 2021-06-04 DIAGNOSIS — F17200 Nicotine dependence, unspecified, uncomplicated: Secondary | ICD-10-CM

## 2021-06-04 DIAGNOSIS — F633 Trichotillomania: Secondary | ICD-10-CM

## 2021-06-04 DIAGNOSIS — G609 Hereditary and idiopathic neuropathy, unspecified: Secondary | ICD-10-CM

## 2021-06-04 MED ORDER — BUPROPION HCL ER (SR) 150 MG PO TB12: 150.0000 mg | ORAL_TABLET | Freq: Two times a day (BID) | ORAL | 5 refills | Status: DC

## 2021-06-04 NOTE — Patient Instructions (Signed)
Zoledronic Acid Injection (Paget's Disease, Osteoporosis) What is this medication? ZOLEDRONIC ACID (ZOE le dron ik AS id) slows calcium loss from bones. It treats Paget's disease and osteoporosis. It may be used in other people at risk for bone loss. This medicine may be used for other purposes; ask your health care provider or pharmacist if you have questions. COMMON BRAND NAME(S): Reclast, Zometa, Zometa Powder What should I tell my care team before I take this medication? They need to know if you have any of these conditions: bleeding disorder cancer dental disease kidney disease low levels of calcium in the blood low red blood cell counts lung or breathing disease (asthma) receiving steroids like dexamethasone or prednisone an unusual or allergic reaction to zoledronic acid, other medicines, foods, dyes, or preservatives pregnant or trying to get pregnant breast-feeding How should I use this medication? This drug is injected into a vein. It is given by a health care provider in a hospital or clinic setting. A special MedGuide will be given to you before each treatment. Be sure to read this information carefully each time. Talk to your health care provider about the use of this drug in children. Special care may be needed. Overdosage: If you think you have taken too much of this medicine contact a poison control center or emergency room at once. NOTE: This medicine is only for you. Do not share this medicine with others. What if I miss a dose? Keep appointments for follow-up doses. It is important not to miss your dose. Call your health care provider if you are unable to keep an appointment. What may interact with this medication? certain antibiotics given by injection NSAIDs, medicines for pain and inflammation, like ibuprofen or naproxen some diuretics like bumetanide, furosemide teriparatide This list may not describe all possible interactions. Give your health care provider a  list of all the medicines, herbs, non-prescription drugs, or dietary supplements you use. Also tell them if you smoke, drink alcohol, or use illegal drugs. Some items may interact with your medicine. What should I watch for while using this medication? Visit your health care provider for regular checks on your progress. It may be some time before you see the benefit from this drug. Some people who take this drug have severe bone, joint, or muscle pain. This drug may also increase your risk for jaw problems or a broken thigh bone. Tell your health care provider right away if you have severe pain in your jaw, bones, joints, or muscles. Tell you health care provider if you have any pain that does not go away or that gets worse. You should make sure you get enough calcium and vitamin D while you are taking this drug. Discuss the foods you eat and the vitamins you take with your health care provider. You may need blood work done while you are taking this drug. Tell your dentist and dental surgeon that you are taking this drug. You should not have major dental surgery while on this drug. See your dentist to have a dental exam and fix any dental problems before starting this drug. Take good care of your teeth while on this drug. Make sure you see your dentist for regular follow-up appointments. What side effects may I notice from receiving this medication? Side effects that you should report to your doctor or health care provider as soon as possible: allergic reactions (skin rash, itching or hives; swelling of the face, lips, or tongue) bone pain infection (fever, chills, cough, sore  throat, pain or trouble passing urine) jaw pain, especially after dental work joint pain kidney injury (trouble passing urine or change in the amount of urine) low calcium levels (fast heartbeat; muscle cramps or pain; pain, tingling, or numbness in the hands or feet; seizures) low red blood cell counts (trouble breathing;  feeling faint; lightheaded, falls; unusually weak or tired) muscle pain palpitations redness, blistering, peeling, or loosening of the skin, including inside the mouth Side effects that usually do not require medical attention (report to your doctor or health care provider if they continue or are bothersome): diarrhea eye irritation, itching, or pain fever general ill feeling or flu-like symptoms headache increase in blood pressure nausea pain, redness, or irritation at site where injected stomach pain upset stomach This list may not describe all possible side effects. Call your doctor for medical advice about side effects. You may report side effects to FDA at 1-800-FDA-1088. Where should I keep my medication? This drug is given in a hospital or clinic. It will not be stored at home. NOTE: This sheet is a summary. It may not cover all possible information. If you have questions about this medicine, talk to your doctor, pharmacist, or health care provider.  2022 [employer] (2021-01-01 00:00:00)  Denosumab injection What is this medication? DENOSUMAB (den oh sue mab) slows bone breakdown. Prolia is used to treat osteoporosis in women after menopause and in men, and in people who are taking corticosteroids for 6 months or more. Delton See is used to treat a high calcium level due to cancer and to prevent bone fractures and other bone problems caused by multiple myeloma or cancer bone metastases. Delton See is also used to treat giant cell tumor of the bone. This medicine may be used for other purposes; ask your health care provider or pharmacist if you have questions. COMMON BRAND NAME(S): Prolia, XGEVA What should I tell my care team before I take this medication? They need to know if you have any of these conditions: dental disease having surgery or tooth extraction infection kidney disease low levels of calcium or Vitamin D in the blood malnutrition on hemodialysis skin  conditions or sensitivity thyroid or parathyroid disease an unusual reaction to denosumab, other medicines, foods, dyes, or preservatives pregnant or trying to get pregnant breast-feeding How should I use this medication? This medicine is for injection under the skin. It is given by a health care professional in a hospital or clinic setting. A special MedGuide will be given to you before each treatment. Be sure to read this information carefully each time. For Prolia, talk to your pediatrician regarding the use of this medicine in children. Special care may be needed. For Delton See, talk to your pediatrician regarding the use of this medicine in children. While this drug may be prescribed for children as young as 13 years for selected conditions, precautions do apply. Overdosage: If you think you have taken too much of this medicine contact a poison control center or emergency room at once. NOTE: This medicine is only for you. Do not share this medicine with others. What if I miss a dose? It is important not to miss your dose. Call your doctor or health care professional if you are unable to keep an appointment. What may interact with this medication? Do not take this medicine with any of the following medications: other medicines containing denosumab This medicine may also interact with the following medications: medicines that lower your chance of fighting infection steroid medicines like prednisone or  cortisone This list may not describe all possible interactions. Give your health care provider a list of all the medicines, herbs, non-prescription drugs, or dietary supplements you use. Also tell them if you smoke, drink alcohol, or use illegal drugs. Some items may interact with your medicine. What should I watch for while using this medication? Visit your doctor or health care professional for regular checks on your progress. Your doctor or health care professional may order blood tests and other  tests to see how you are doing. Call your doctor or health care professional for advice if you get a fever, chills or sore throat, or other symptoms of a cold or flu. Do not treat yourself. This drug may decrease your body's ability to fight infection. Try to avoid being around people who are sick. You should make sure you get enough calcium and vitamin D while you are taking this medicine, unless your doctor tells you not to. Discuss the foods you eat and the vitamins you take with your health care professional. See your dentist regularly. Brush and floss your teeth as directed. Before you have any dental work done, tell your dentist you are receiving this medicine. Do not become pregnant while taking this medicine or for 5 months after stopping it. Talk with your doctor or health care professional about your birth control options while taking this medicine. Women should inform their doctor if they wish to become pregnant or think they might be pregnant. There is a potential for serious side effects to an unborn child. Talk to your health care professional or pharmacist for more information. What side effects may I notice from receiving this medication? Side effects that you should report to your doctor or health care professional as soon as possible: allergic reactions like skin rash, itching or hives, swelling of the face, lips, or tongue bone pain breathing problems dizziness jaw pain, especially after dental work redness, blistering, peeling of the skin signs and symptoms of infection like fever or chills; cough; sore throat; pain or trouble passing urine signs of low calcium like fast heartbeat, muscle cramps or muscle pain; pain, tingling, numbness in the hands or feet; seizures unusual bleeding or bruising unusually weak or tired Side effects that usually do not require medical attention (report to your doctor or health care professional if they continue or are  bothersome): constipation diarrhea headache joint pain loss of appetite muscle pain runny nose tiredness upset stomach This list may not describe all possible side effects. Call your doctor for medical advice about side effects. You may report side effects to FDA at 1-800-FDA-1088. Where should I keep my medication? This medicine is only given in a clinic, doctor's office, or other health care setting and will not be stored at home. NOTE: This sheet is a summary. It may not cover all possible information. If you have questions about this medicine, talk to your doctor, pharmacist, or health care provider.  2022 [employer] (2017-08-21 00:00:00)

## 2021-06-04 NOTE — Progress Notes (Signed)
[name]    [id number]    10-Jul-1960  Primary Care Physician:Grisso, Clarita Crane., MD  Referring Physician: [name], MD [address] [place],  [place] [address] Reason for Consultation: pulmonary evaluation for surgery Date of Consultation: 06/04/2021  Chief complaint:   Chief Complaint  Patient presents with   Consult    She needs clearance for her L-4, L-5 spine,      HPI: [name] is a 61 y.o. woman who presents for new patient evaluation for pulmonary evaluation.   She had an episode of aspiration pneumonia while kayaking and inhaling water. This was in 2020 at [facility]. She was on BIPAP but never intubated. Was in the ICU for 8 days.   In 2021 she had surgery for her back and was hospitalized at [facility]. She had an uncomplicated surgery and then had hypotension post-operative and nad worsening post-operative atelectasis and was on 15LNC and maintained in the ICU.   Since 2021 she has been doing ok. She is currently been treated for episodes of bronchitis.  Three weeks ago had antibiotics and steroid injection. From PCP.   She denies recurrent bronchitis requiring prednisone otherwise. She is not inhalers for her breathing. She is not short of breath during the day.   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  Exposures: lives at home with boyfriend, he smokes as well.  Smoking history: She smokes 1/2 ppd x 42 years. 20 pack years.   Social History   Occupational History   Not on file  Tobacco Use   Smoking status: Some Days    Packs/day: 0.50    Years: 42.00    Pack years: 21.00    Types: Cigarettes    Passive exposure: Never   Smokeless tobacco: Never  Vaping Use   Vaping Use: Never used  Substance and Sexual Activity   Alcohol use: Yes    Comment: weekends - social   Drug use: No    Sexual activity: Not Currently    Relevant family history:  Family History  Problem Relation Age of Onset   Diabetes Mother    Cancer Mother    Hypertension Father    Heart disease Father    Diabetes Father    Healthy Brother    Healthy Son    Diabetes Son    Healthy Daughter     Past Medical History:  Diagnosis Date   Abnormal thyroid function test 12/06/2015   Acute bilateral thoracic back pain 12/03/2018   Acute left ankle pain 03/09/2020   Alcohol use 04/15/2019   Formatting of this note might be different from the original. Recommend abstinence from alcohol   Anxiety disorder 06/05/2015   Last Assessment & Plan:  Formatting of this note might be different from the original. Had started her on some xanax about week ago and she feels it has helped her and she is taking about half tablet for this and will continue with it as she has been compliant with it   Arteriosclerotic vascular disease 11/06/2017   Arthritis    back    Atherosclerosis 06/05/2015   Formatting of this note might be different from the original. Seen on CTs of chest.   Atherosclerosis of native coronary artery of native heart without angina pectoris  02/11/2016   Attention deficit 12/04/2017   Blurred vision 07/11/2020   BMI 27.0-27.9,adult 07/11/2015   Last Assessment & Plan:  Formatting of this note might be different from the original. Relevant Hx: Course: Daily Update: Today's Plan:difficult for her with the insulin as well as her abilify and the weight is increaseing for her rather than decreasing, she has cut her alcohol intake back, she has worked on her diet more with decreasing her carb intake as well and still difficulty discussed with    Bursitis of both hips 04/14/2018   CAD (coronary artery disease) 12/24/2017   Chest pain 11/06/2017   Chronic bronchitis ([place]) 12/04/2015   Formatting of this note might be different from the original. PFT ratio 88% FEV1 103% FVC 94% DLCO 76%  Last Assessment &  Plan:  Formatting of this note might be different from the original. We discussed this as she has been coughing again for about 2 mnths and she sounds congested on exam with previous ease for pneumonia will place her on levaquin and have reviewed with her the importance of her    Cigarette smoker 02/26/2016   Coronary arteriosclerosis 12/24/2017   Coronary artery disease of native artery of native heart with stable angina pectoris ([place]) 01/25/2016   Formatting of this note might be different from the original. 2017:  50% LAD lesion.  Calcium noted other places.  Heart cath December 31, 2017 with a 30% ostial LAD lesion otherwise normal coronary arteries seen with LVEF 55 to 65%   Deficiency of other specified B group vitamins 11/07/2016   Degeneration of lumbar intervertebral disc 11/06/2017   Diabetes mellitus due to underlying condition with unspecified complications ([place]) 85/46/2703   Diabetes mellitus type 1, uncomplicated ([place]) 50/12/3816   Added automatically from request for surgery [id number]  Formatting of this note might be different from the original. Added automatically from request for surgery [id number]   Diabetes mellitus without complication ([place])    Type 1- injections only- recent change to [place] with improved A1C reading.   Dysphagia 11/04/2017   Dyspnea on exertion 12/04/2017   Elevated alkaline phosphatase level 11/09/2020   Elevated LFTs 03/10/2019   Formatting of this note might be different from the original. History.  Cannot change to this.   Erythrocytosis 12/04/2017   Facial tingling 07/11/2020   Fatigue 11/06/2017   Fever 38/01/1750   Folic acid deficiency 02/58/5277   Fusion of lumbar spine 01/11/2020   HAP (hospital-acquired pneumonia) 01/13/2020   Hemangioma 11/06/2017   History of metabolic disorder 82/42/3536   History of pneumonia 01/25/2020   Formatting of this note might be different from the original. Recent mutifocal pneumonia September 2021 Formatting of this  note might be different from the original. Formatting of this note might be different from the original. Recent mutifocal pneumonia September 2021   History of sepsis 01/25/2020   Formatting of this note might be different from the original. Recent septic syndrome with multifocal pneumonia Formatting of this note might be different from the original. Formatting of this note might be different from the original. Recent septic syndrome with multifocal pneumonia   HNP (herniated nucleus pulposus), lumbar 05/10/2014   Hypotension 07/18/2020   Idiopathic peripheral neuropathy 08/29/2020   Insulin long-term use ([place]) 11/06/2017   Intermittent claudication ([place]) 12/04/2017   Irritable bowel syndrome 09/03/2020   Left foot pain 01/28/2018   Lipoma of abdominal wall 08/08/2020   Lipoma of left upper extremity 09/04/2020   Low back pain 10/22/2019  Lumbar post-laminectomy syndrome 03/04/2019   Lumbar radiculopathy 05/26/2017   Lung mass 12/04/2017   Malaise and fatigue 11/06/2017   Menopausal disorder 11/06/2017   Mixed hyperlipidemia 04/09/2016   Formatting of this note might be different from the original. Added automatically from request for surgery [id number]   Moderate chronic obstructive pulmonary disease ([place]) 11/06/2017   Multiple actinic keratoses 11/06/2017   Myositis 12/04/2017   Nicotine dependence, cigarettes, uncomplicated 83/15/1761   Obsessive-compulsive disorder 10/14/2019   Formatting of this note might be different from the original. Formerly followed with [facility].   Overweight with body mass index (BMI) 25.0-29.9 07/11/2015   Last Assessment & Plan:  Relevant Hx: Course: Daily Update: Today's Plan:difficult for her with the insulin as well as her abilify and the weight is increaseing for her rather than decreasing, she has cut her alcohol intake back, she has worked on her diet more with decreasing her carb intake as well and still difficulty discussed with the saxenda and will  try to get this approved for her and see    Pain in both lower extremities 07/10/2015   Last Assessment & Plan:  Formatting of this note might be different from the original. Relevant Hx: Course: Daily Update: Today's Plan:she had pain to her right anterior thigh there is edema of both legs and she has fear for DVT which she is going to have [place] of and confirm ok, she has increased her weight which is part of her edema I believe as well as her insulin usage. She is advised as well tha   Pain of left hip joint 07/23/2018   Pelvic pain 12/14/2020   Personal history of (healed) traumatic fracture 12/31/2016   Pneumonia    Polyarthralgia 06/11/2018   Primary insomnia 06/05/2015   Primary osteoarthritis involving multiple joints 08/24/2019   Pure hypercholesterolemia 02/11/2016   Purpura ([place]) 11/06/2017   Respiratory failure ([place]) 11/2018   RUQ pain 11/09/2020   Sciatica 11/03/2017   Scoliosis deformity of spine 11/03/2017   Secondary diabetes mellitus ([place]) 12/24/2017   Senile purpura ([place]) 12/04/2017   Sepsis ([place]) 07/18/2020   Smoking greater than 30 pack years 02/26/2016   Tobacco abuse 02/11/2016   Tobacco user 02/11/2016   Trichotillomania    hx.    Trigger thumb of right hand 60/73/7106   Uncomplicated type 1 diabetes mellitus ([place]) 10/22/2020    Past Surgical History:  Procedure Laterality Date   ABDOMINAL EXPOSURE N/A 01/11/2020   Procedure: ABDOMINAL EXPOSURE;  Surgeon: Marty Heck, MD;  Location: [facility] OR;  Service: Vascular;  Laterality: N/A;   ANTERIOR LUMBAR FUSION N/A 01/11/2020   Procedure: ANTERIOR LUMBAR INTERBODY FUSION  LUMBAR FIVE-SACRAL ONE, LEFT LUMBAR TWO-THREE FORAMINOTOMY, DISECTOMY;  Surgeon: Melina Schools, MD;  Location: [facility];  Service: Orthopedics;  Laterality: N/A;   APPENDECTOMY  04/28/1970   BACK SURGERY  2016   CARDIAC CATHETERIZATION  12/31/2017   COLONOSCOPY  04/28/2017   EXCISION/RELEASE BURSA HIP Left 04/14/2018   Procedure: Excision  trochanteric bursa left hip;  Surgeon: Susa Day, MD;  Location: [facility];  Service: Orthopedics;  Laterality: Left;  60 mins   HAND SURGERY     HIP OPEN REDUCTION Right    ORIF   INTRAVASCULAR PRESSURE WIRE/FFR STUDY N/A 05/06/2021   Procedure: INTRAVASCULAR PRESSURE WIRE/FFR STUDY;  Surgeon: Nelva Bush, MD;  Location: [facility];  Service: Cardiovascular;  Laterality: N/A;   KNEE ARTHROSCOPY     LEFT HEART CATH AND CORONARY ANGIOGRAPHY N/A 12/31/2017  Procedure: LEFT HEART CATH AND CORONARY ANGIOGRAPHY;  Surgeon: Martinique, Peter M, MD;  Location: [facility];  Service: Cardiovascular;  Laterality: N/A;   LEFT HEART CATH AND CORONARY ANGIOGRAPHY N/A 05/06/2021   Procedure: LEFT HEART CATH AND CORONARY ANGIOGRAPHY;  Surgeon: Nelva Bush, MD;  Location: [facility];  Service: Cardiovascular;  Laterality: N/A;   LUMBAR LAMINECTOMY/DECOMPRESSION MICRODISCECTOMY Left 05/10/2014   Procedure: MICRODISCECTOMY LUMBAR DECOMPRESSION L5-S1 LEFT ;  Surgeon: Johnn Hai, MD;  Location: [facility];  Service: Orthopedics;  Laterality: Left;   LUMBAR LAMINECTOMY/DECOMPRESSION MICRODISCECTOMY N/A 01/11/2020   Procedure: LUMBAR LAMINECTOMY/DECOMPRESSION MICRODISCECTOMY LEFT LUMBAR TWO-THREE;  Surgeon: Melina Schools, MD;  Location: [facility];  Service: Orthopedics;  Laterality: N/A;   ORIF WRIST FRACTURE Right    retained hardware   ORTHOPAEDIC SURGERY  04/14/2018   TUBAL LIGATION  12/25/1995     Physical Exam: Blood pressure 118/76, pulse 61, temperature 98.2 F (36.8 C), temperature source Oral, height 5\' 2"  (1.575 m), weight 147 lb (66.7 kg), last menstrual period 05/04/2008, SpO2 98 %. Gen:      No acute distress ENT:  no nasal polyps, mucus membranes moist Lungs:    No increased respiratory effort, symmetric chest wall excursion, clear to auscultation bilaterally, no wheezes or crackles CV:         Regular rate and rhythm; no murmurs, rubs, or gallops.  No pedal edema Abd:       + bowel sounds; soft, non-tender; no distension MSK: no acute synovitis of DIP or PIP joints, no mechanics hands.  Skin:      Warm and dry; no rashes Neuro: normal speech, no focal facial asymmetry Psych: alert and oriented x3, normal mood and affect   Data Reviewed/Medical Decision Making:  Independent interpretation of tests: Imaging:  Review of patient's chest xray 12/2019 images revealed patchy bilateral airspace opacities. The patient's images have been independently reviewed by me.  CT Angio 12/2019 also reviewed shows similar  PFTs: PFT ratio 88% FEV1 103% FVC 94% DLCO 76% Done at [facility] No flowsheet data found.  Labs:  Lab Results  Component Value Date   WBC 9.8 01/21/2020   HGB 9.3 (L) 01/21/2020   HCT 29.4 (L) 01/21/2020   MCV 98.7 01/21/2020   PLT 249 01/21/2020   Lab Results  Component Value Date   NA 137 01/21/2020   K 4.0 01/21/2020   CL 98 01/21/2020   CO2 29 01/21/2020      Immunization status:  Immunization History  Administered Date(s) Administered   Influenza,inj,Quad PF,6+ Mos 01/21/2016, 12/31/2016, 02/09/2018, 01/18/2021   Influenza,inj,quad, With Preservative 12/27/2016   Influenza-Unspecified 01/21/2016, 12/31/2016   Moderna Sars-Covid-2 Vaccination 04/29/2019, 05/30/2019   Pneumococcal Conjugate-13 07/23/2017   Pneumococcal Polysaccharide-23 02/01/2021     I reviewed prior external note(s) from pulmonary Dr Duane Boston  I reviewed the result(s) of the labs and imaging as noted above.   I have ordered PFT   Assessment:  Tobacco use disorder Preoperative pulmonary evaluation  Plan/Recommendations: Concern for COPD - PFTs 2021 reviewed and normal pulmonary function. However has had an episode of bronchitis since then.  Will obtain repeat PFTs.  Despite normal pulmonary function has already had an episode of post-operative respiratory failure.  I think in order to best reduce her risk for this, recommend quitting smoking 30 days  prior to any elective surgeries.   Will prescribe wellbutrin for smoking cessation. I'll see her in a month to follow up.    Smoking  Cessation Counseling:  1. The patient is an everyday smoker and symptomatic due to the following condition respiratory failure 2. The patient is currently contemplative in quitting smoking. 3. I advised patient to quit smoking. 4. We identified patient specific barriers to change.  5. I personally spent 3 minutes counseling the patient regarding tobacco use disorder. 6. We discussed management of stress and anxiety to help with smoking cessation, when applicable. 7. We discussed nicotine replacement therapy, Wellbutrin, Chantix as possible options. 8. I advised setting a quit date. 9. Follow?up arranged with our office to continue ongoing discussions. 10.Resources given to patient including quit hotline.   We discussed disease management and progression at length today.    Return to Care: No follow-ups on file.  Lenice Llamas, MD Pulmonary and [place]  CC: [name], MD

## 2021-06-04 NOTE — Patient Instructions (Signed)
Please schedule follow up scheduled with myself in 1 months.  If my schedule is not open yet, we will contact you with a reminder closer to that time. Please call [phone] if you haven't heard from [place] a month before.   Before your next visit I would like you to have: Full set of PFTs  Bupropion -- Bupropion (brand names: Zyban, Wellbutrin) is an antidepressant that can be used to help you stop smoking.  Start taking it once per day for three days, then increase to twice daily starting four weeks before the quit date.  You should typically continue for 7 to 12 weeks after you quit smoking.  Please do not stop taking this medication abruptly.  When you are ready to stop we will have you go to once daily for two weeks and then you can do once every other day for a week before you stop.   Bupropion is generally well-tolerated, but it may cause dry mouth and difficulty sleeping. The drug should not be used by people who have a seizure disorder or bipolar (manic-depressive) disorder, and it is not recommended for those who have head trauma, anorexia nervosa, or bulimia, or for those who drink alcohol excessively.   What are the benefits of quitting smoking? Quitting smoking can lower your chances of getting or dying from heart disease, lung disease, kidney failure, infection, or cancer. It can also lower your chances of getting osteoporosis, a condition that makes your bones weak. Plus, quitting smoking can help your skin look younger and reduce the chances that you will have problems with sex.  Quitting smoking will improve your health no matter how old you are, and no matter how long or how much you have smoked.  What should I do if I want to quit smoking? The letters in the word "START" can help you remember the steps to take: S = Set a quit date. T = Tell family, friends, and the people around you that you plan to quit. A = Anticipate or plan ahead for the tough times you'll face while  quitting. R = Remove cigarettes and other tobacco products from your home, car, and work. T = Talk to your doctor about getting help to quit.  How can my doctor or nurse help? Your doctor or nurse can give you advice on the best way to quit. He or she can also put you in touch with counselors or other people you can call for support. Plus, your doctor or nurse can give you medicines to: ?Reduce your craving for cigarettes ?Reduce the unpleasant symptoms that happen when you stop smoking (called "withdrawal symptoms"). You can also get help from a free phone line (1-800-QUIT-NOW) or go online to [facility].fr.  What are the symptoms of withdrawal? The symptoms include: ?Trouble sleeping ?Being irritable, anxious or restless ?Getting frustrated or angry ?Having trouble thinking clearly  Some people who stop smoking become temporarily depressed. Some people need treatment for depression, such as counseling or antidepressant medicines. Depressed people might: ?No longer enjoy or care about doing the things they used to like to do ?Feel sad, down, hopeless, nervous, or cranky most of the day, almost every day ?Lose or gain weight ?Sleep too much or too little ?Feel tired or like they have no energy ?Feel guilty or like they are worth nothing ?Forget things or feel confused ?Move and speak more slowly than usual ?Act restless or have trouble staying still ?Think about death or suicide  If  you think you might be depressed, see your doctor or nurse. Only someone trained in mental health can tell for sure if you are depressed. If you ever feel like you might hurt yourself, go straight to the nearest emergency department. Or you can call for an ambulance (in the [place] and [place], [place] 9-1-1) or call your doctor or nurse right away and tell them it is an emergency. You can also reach the [place] National Suicide Prevention Lifeline at 902-025-5990 or http://walker-sanchez.info/.  How do  medicines help you stop smoking? Different medicines work in different ways: ?Nicotine replacement therapy eases withdrawal and reduces your body's craving for nicotine, the main drug found in cigarettes. There are different forms of nicotine replacement, including skin patches, lozenges, gum, nasal sprays, and "puffers" or inhalers. Many can be bought without a prescription, while others might require one. ?Bupropion is a prescription medicine that reduces your desire to smoke. This medicine is sold under the brand names Zyban and Wellbutrin. It is also available in a generic version, which is cheaper than brand name medicines. ?Varenicline (brand names: Chantix, Champix) is a prescription medicine that reduces withdrawal symptoms and cigarette cravings. If you think you'd like to take varenicline and you have a history of depression, anxiety, or heart disease, discuss this with your doctor or nurse before taking the medicine. Varenicline can also increase the effects of alcohol in some people. It's a good idea to limit drinking while you're taking it, at least until you know how it affects you.  How does counseling work? Counseling can happen during formal office visits or just over the phone. A counselor can help you: ?Figure out what triggers your smoking and what to do instead ?Overcome cravings ?Figure out what went wrong when you tried to quit before  What works best? Studies show that people have the best luck at quitting if they take medicines to help them quit and work with a Social worker. It might also be helpful to combine nicotine replacement with one of the prescription medicines that help people quit. In some cases, it might even make sense to take bupropion and varenicline together.  What about e-cigarettes? Sometimes people wonder if using electronic cigarettes, or "e-cigarettes," might help them quit smoking. Using e-cigarettes is also called "vaping." Doctors do not recommend  e-cigarettes in place of medicines and counseling. That's because e-cigarettes still contain nicotine as well as other substances that might be harmful. It's not clear how they can affect a person's health in the long term.  Will I gain weight if I quit? Yes, you might gain a few pounds. But quitting smoking will have a much more positive effect on your health than weighing a few pounds more. Plus, you can help prevent some weight gain by being more active and eating less. Taking the medicine bupropion might help control weight gain.   What else can I do to improve my chances of quitting? You can: ?Start exercising. ?Stay away from smokers and places that you associate with smoking. If people close to you smoke, ask them to quit with you. ?Keep gum, hard candy, or something to put in your mouth handy. If you get a craving for a cigarette, try one of these instead. ?Don't give up, even if you start smoking again. It takes most people a few tries before they succeed.  What if I am pregnant and I smoke? If you are pregnant, it's really important for the health of your baby that you quit. Ask  your doctor what options you have, and what is safest for your baby

## 2021-06-05 ENCOUNTER — Encounter: Payer: Self-pay | Admitting: Rheumatology

## 2021-06-06 ENCOUNTER — Ambulatory Visit: Payer: BC Managed Care – PPO | Admitting: Cardiology

## 2021-06-06 DIAGNOSIS — E349 Endocrine disorder, unspecified: Secondary | ICD-10-CM

## 2021-06-06 DIAGNOSIS — M810 Age-related osteoporosis without current pathological fracture: Secondary | ICD-10-CM | POA: Insufficient documentation

## 2021-06-06 DIAGNOSIS — R7989 Other specified abnormal findings of blood chemistry: Secondary | ICD-10-CM

## 2021-06-06 HISTORY — DX: Other specified abnormal findings of blood chemistry: R79.89

## 2021-06-06 HISTORY — DX: Age-related osteoporosis without current pathological fracture: M81.0

## 2021-06-06 HISTORY — DX: Endocrine disorder, unspecified: E34.9

## 2021-06-06 LAB — PROTEIN ELECTROPHORESIS, SERUM, WITH REFLEX
Albumin ELP: 3.7 g/dL — ABNORMAL LOW (ref 3.8–4.8)
Alpha 1: 0.3 g/dL (ref 0.2–0.3)
Alpha 2: 0.8 g/dL (ref 0.5–0.9)
Beta 2: 0.3 g/dL (ref 0.2–0.5)
Beta Globulin: 0.5 g/dL (ref 0.4–0.6)
Gamma Globulin: 0.5 g/dL — ABNORMAL LOW (ref 0.8–1.7)
Total Protein: 6.1 g/dL (ref 6.1–8.1)

## 2021-06-06 LAB — TSH: TSH: 0.64 mIU/L (ref 0.40–4.50)

## 2021-06-06 LAB — GLIADIN ANTIBODIES, SERUM
Gliadin IgA: <1 U/mL
Gliadin IgG: <1 U/mL

## 2021-06-06 LAB — PARATHYROID HORMONE, INTACT (NO CA): PTH: 80 pg/mL — ABNORMAL HIGH (ref 16–77)

## 2021-06-06 LAB — VITAMIN D 25 HYDROXY (VIT D DEFICIENCY, FRACTURES): Vit D, 25-Hydroxy: 35 ng/mL (ref 30–100)

## 2021-06-06 LAB — TISSUE TRANSGLUTAMINASE, IGA: (tTG) Ab, IgA: <1 U/mL

## 2021-06-06 NOTE — Telephone Encounter (Signed)
PTH is only mildly elevated which is not of concern. She may discuss this further with her PCP.

## 2021-06-06 NOTE — Telephone Encounter (Signed)
OV notes and clearance form have been faxed back to [facility]. Nothing further needed at this time.  

## 2021-06-07 NOTE — Progress Notes (Signed)
All the labs are within normal limits.  PTH is mildly elevated which is not significant.  We will discuss results at the follow-up visit.

## 2021-06-12 ENCOUNTER — Encounter: Payer: Self-pay | Admitting: Internal Medicine

## 2021-06-12 ENCOUNTER — Other Ambulatory Visit: Payer: Self-pay

## 2021-06-12 ENCOUNTER — Ambulatory Visit (INDEPENDENT_AMBULATORY_CARE_PROVIDER_SITE_OTHER): Payer: BC Managed Care – PPO | Admitting: Internal Medicine

## 2021-06-12 DIAGNOSIS — F17200 Nicotine dependence, unspecified, uncomplicated: Secondary | ICD-10-CM

## 2021-06-12 DIAGNOSIS — Z01811 Encounter for preprocedural respiratory examination: Secondary | ICD-10-CM

## 2021-06-12 LAB — PULMONARY FUNCTION TEST
DL/VA % pred: 89 %
DL/VA: 3.81 ml/min/mmHg/L
DLCO cor % pred: 81 %
DLCO cor: 15.23 ml/min/mmHg
DLCO unc % pred: 81 %
DLCO unc: 15.23 ml/min/mmHg
FEF 25-75 Post: 3.16 L/sec
FEF 25-75 Pre: 2.55 L/sec
FEF2575-%Change-Post: 23 %
FEF2575-%Pred-Post: 143 %
FEF2575-%Pred-Pre: 116 %
FEV1-%Change-Post: 4 %
FEV1-%Pred-Post: 103 %
FEV1-%Pred-Pre: 99 %
FEV1-Post: 2.42 L
FEV1-Pre: 2.32 L
FEV1FVC-%Change-Post: 0 %
FEV1FVC-%Pred-Pre: 109 %
FEV6-%Change-Post: 3 %
FEV6-%Pred-Post: 95 %
FEV6-%Pred-Pre: 92 %
FEV6-Post: 2.81 L
FEV6-Pre: 2.71 L
FEV6FVC-%Pred-Post: 103 %
FEV6FVC-%Pred-Pre: 103 %
FVC-%Change-Post: 3 %
FVC-%Pred-Post: 92 %
FVC-%Pred-Pre: 89 %
FVC-Post: 2.81 L
FVC-Pre: 2.71 L
Post FEV1/FVC ratio: 86 %
Post FEV6/FVC ratio: 100 %
Pre FEV1/FVC ratio: 85 %
Pre FEV6/FVC Ratio: 100 %
RV % pred: 89 %
RV: 1.71 L
TLC % pred: 100 %
TLC: 4.77 L

## 2021-06-12 NOTE — Progress Notes (Signed)
Full PFT performed today. °

## 2021-06-12 NOTE — Patient Instructions (Signed)
Full PFT performed today. °

## 2021-06-13 ENCOUNTER — Encounter: Payer: Self-pay | Admitting: Internal Medicine

## 2021-06-26 ENCOUNTER — Ambulatory Visit: Payer: BC Managed Care – PPO | Admitting: Rheumatology

## 2021-07-03 NOTE — Progress Notes (Unsigned)
Office Visit Note  Patient: [name]             Date of Birth: 1961-02-04           MRN: [id number]             PCP: [name], MD Referring: [name], MD Visit Date: 07/17/2021 Occupation: '@GUAROCC'$ @  Subjective:  No chief complaint on file.   History of Present Illness: [name] is a 61 y.o. female ***   Activities of Daily Living:  Patient reports morning stiffness for *** {minute/hour:19697}.   Patient {ACTIONS;DENIES/REPORTS:21021675::"Denies"} nocturnal pain.  Difficulty dressing/grooming: {ACTIONS;DENIES/REPORTS:21021675::"Denies"} Difficulty climbing stairs: {ACTIONS;DENIES/REPORTS:21021675::"Denies"} Difficulty getting out of chair: {ACTIONS;DENIES/REPORTS:21021675::"Denies"} Difficulty using hands for taps, buttons, cutlery, and/or writing: {ACTIONS;DENIES/REPORTS:21021675::"Denies"}  No Rheumatology ROS completed.   PMFS History:  Patient Active Problem List   Diagnosis Date Noted   Accelerating angina ([place]) 05/06/2021   Complex regional pain syndrome of lower limb 03/07/2021   Pelvic pain 12/14/2020   RUQ pain 11/09/2020   Elevated alkaline phosphatase level 40/01/2724   Uncomplicated type 1 diabetes mellitus ([place]) 10/22/2020   Lipoma of left upper extremity 09/04/2020   Irritable bowel syndrome 09/03/2020   Idiopathic peripheral neuropathy 08/29/2020   Lipoma of abdominal wall 08/08/2020   Fever 07/18/2020   Hypotension 07/18/2020   Sepsis ([place]) 07/18/2020   Pneumonia 07/18/2020   Arthritis 07/18/2020   Blurred vision 07/11/2020   Facial tingling 07/11/2020   Diabetes mellitus without complication ([place])    Acute left ankle pain 03/09/2020   History of pneumonia 01/25/2020   History of sepsis 01/25/2020   Respiratory failure ([place]) 01/13/2020   HAP (hospital-acquired pneumonia) 01/13/2020   Fusion of lumbar spine 01/11/2020   Trigger thumb of right hand 11/14/2019   Low back pain 10/22/2019   Obsessive-compulsive disorder  10/14/2019   Diabetes mellitus due to underlying condition with unspecified complications ([place]) 36/64/4034   Primary osteoarthritis involving multiple joints 08/24/2019   Alcohol use 04/15/2019   Elevated LFTs 03/10/2019   Lumbar post-laminectomy syndrome 03/04/2019   Acute bilateral thoracic back pain 12/03/2018   Pain of left hip joint 07/23/2018   Polyarthralgia 06/11/2018   Bursitis of both hips 04/14/2018   Left foot pain 01/28/2018   CAD (coronary artery disease) 12/24/2017   Secondary diabetes mellitus ([place]) 12/24/2017   Coronary arteriosclerosis 12/24/2017   Dyspnea on exertion 12/04/2017   Attention deficit 12/04/2017   Intermittent claudication ([place]) 12/04/2017   History of metabolic disorder 74/25/9563   Lung mass 12/04/2017   Myositis 12/04/2017   Erythrocytosis 12/04/2017   Senile purpura ([place]) 12/04/2017   Chest pain 11/06/2017   Multiple actinic keratoses 11/06/2017   Degeneration of lumbar intervertebral disc 11/06/2017   Fatigue 11/06/2017   Insulin long-term use ([place]) 11/06/2017   Menopausal disorder 11/06/2017   Purpura ([place]) 11/06/2017   Moderate chronic obstructive pulmonary disease ([place]) 87/56/4332   Folic acid deficiency 95/18/8416   Malaise and fatigue 11/06/2017   Arteriosclerotic vascular disease 11/06/2017   Dysphagia 11/04/2017   Sciatica 11/03/2017   Scoliosis deformity of spine 11/03/2017   Lumbar radiculopathy 05/26/2017   Personal history of (healed) traumatic fracture 12/31/2016   Deficiency of other specified B group vitamins 11/07/2016   Mixed hyperlipidemia 04/09/2016   Smoking greater than 30 pack years 02/26/2016   Cigarette smoker 02/26/2016   Nicotine dependence, cigarettes, uncomplicated 60/63/0160   Diabetes mellitus type 1, uncomplicated ([place]) 10/93/2355   Tobacco abuse 02/11/2016   Tobacco user 02/11/2016  Pure hypercholesterolemia 02/11/2016   Atherosclerosis of native coronary artery of native heart without angina pectoris  02/11/2016   Coronary artery disease of native artery of native heart with stable angina pectoris ([place]) 01/25/2016   Abnormal thyroid function test 12/06/2015   Chronic bronchitis ([place]) 12/04/2015   Overweight with body mass index (BMI) 25.0-29.9 07/11/2015   BMI 27.0-27.9,adult 07/11/2015   Pain in both lower extremities 07/10/2015   Anxiety disorder 06/05/2015   Atherosclerosis 06/05/2015   Hemangioma 06/05/2015   Primary insomnia 06/05/2015   Trichotillomania 06/05/2015   HNP (herniated nucleus pulposus), lumbar 05/10/2014    Past Medical History:  Diagnosis Date   Abnormal thyroid function test 12/06/2015   Acute bilateral thoracic back pain 12/03/2018   Acute left ankle pain 03/09/2020   Alcohol use 04/15/2019   Formatting of this note might be different from the original. Recommend abstinence from alcohol   Anxiety disorder 06/05/2015   Last Assessment & Plan:  Formatting of this note might be different from the original. Had started her on some xanax about week ago and she feels it has helped her and she is taking about half tablet for this and will continue with it as she has been compliant with it   Arteriosclerotic vascular disease 11/06/2017   Arthritis    back    Atherosclerosis 06/05/2015   Formatting of this note might be different from the original. Seen on CTs of chest.   Atherosclerosis of native coronary artery of native heart without angina pectoris 02/11/2016   Attention deficit 12/04/2017   Blurred vision 07/11/2020   BMI 27.0-27.9,adult 07/11/2015   Last Assessment & Plan:  Formatting of this note might be different from the original. Relevant Hx: Course: Daily Update: Today's Plan:difficult for her with the insulin as well as her abilify and the weight is increaseing for her rather than decreasing, she has cut her alcohol intake back, she has worked on her diet more with decreasing her carb intake as well and still difficulty discussed with    Bursitis of both  hips 04/14/2018   CAD (coronary artery disease) 12/24/2017   Chest pain 11/06/2017   Chronic bronchitis ([place]) 12/04/2015   Formatting of this note might be different from the original. PFT ratio 88% FEV1 103% FVC 94% DLCO 76%  Last Assessment & Plan:  Formatting of this note might be different from the original. We discussed this as she has been coughing again for about 2 mnths and she sounds congested on exam with previous ease for pneumonia will place her on levaquin and have reviewed with her the importance of her    Cigarette smoker 02/26/2016   Coronary arteriosclerosis 12/24/2017   Coronary artery disease of native artery of native heart with stable angina pectoris ([place]) 01/25/2016   Formatting of this note might be different from the original. 2017:  50% LAD lesion.  Calcium noted other places.  Heart cath December 31, 2017 with a 30% ostial LAD lesion otherwise normal coronary arteries seen with LVEF 55 to 65%   Deficiency of other specified B group vitamins 11/07/2016   Degeneration of lumbar intervertebral disc 11/06/2017   Diabetes mellitus due to underlying condition with unspecified complications ([place]) 16/01/9603   Diabetes mellitus type 1, uncomplicated ([place]) 54/12/8117   Added automatically from request for surgery [id number]  Formatting of this note might be different from the original. Added automatically from request for surgery [id number]   Diabetes mellitus without complication (HCC)    Type 1- injections  only- recent change to [place] with improved A1C reading.   Dysphagia 11/04/2017   Dyspnea on exertion 12/04/2017   Elevated alkaline phosphatase level 11/09/2020   Elevated LFTs 03/10/2019   Formatting of this note might be different from the original. History.  Cannot change to this.   Erythrocytosis 12/04/2017   Facial tingling 07/11/2020   Fatigue 11/06/2017   Fever 17/51/0258   Folic acid deficiency 52/77/8242   Fusion of lumbar spine 01/11/2020   HAP (hospital-acquired  pneumonia) 01/13/2020   Hemangioma 11/06/2017   History of metabolic disorder 35/36/1443   History of pneumonia 01/25/2020   Formatting of this note might be different from the original. Recent mutifocal pneumonia September 2021 Formatting of this note might be different from the original. Formatting of this note might be different from the original. Recent mutifocal pneumonia September 2021   History of sepsis 01/25/2020   Formatting of this note might be different from the original. Recent septic syndrome with multifocal pneumonia Formatting of this note might be different from the original. Formatting of this note might be different from the original. Recent septic syndrome with multifocal pneumonia   HNP (herniated nucleus pulposus), lumbar 05/10/2014   Hypotension 07/18/2020   Idiopathic peripheral neuropathy 08/29/2020   Insulin long-term use ([place]) 11/06/2017   Intermittent claudication ([place]) 12/04/2017   Irritable bowel syndrome 09/03/2020   Left foot pain 01/28/2018   Lipoma of abdominal wall 08/08/2020   Lipoma of left upper extremity 09/04/2020   Low back pain 10/22/2019   Lumbar post-laminectomy syndrome 03/04/2019   Lumbar radiculopathy 05/26/2017   Lung mass 12/04/2017   Malaise and fatigue 11/06/2017   Menopausal disorder 11/06/2017   Mixed hyperlipidemia 04/09/2016   Formatting of this note might be different from the original. Added automatically from request for surgery [id number]   Moderate chronic obstructive pulmonary disease ([place]) 11/06/2017   Multiple actinic keratoses 11/06/2017   Myositis 12/04/2017   Nicotine dependence, cigarettes, uncomplicated 67/61/9509   Obsessive-compulsive disorder 10/14/2019   Formatting of this note might be different from the original. Formerly followed with [facility].   Overweight with body mass index (BMI) 25.0-29.9 07/11/2015   Last Assessment & Plan:  Relevant Hx: Course: Daily Update: Today's Plan:difficult for her with the insulin  as well as her abilify and the weight is increaseing for her rather than decreasing, she has cut her alcohol intake back, she has worked on her diet more with decreasing her carb intake as well and still difficulty discussed with the saxenda and will try to get this approved for her and see    Pain in both lower extremities 07/10/2015   Last Assessment & Plan:  Formatting of this note might be different from the original. Relevant Hx: Course: Daily Update: Today's Plan:she had pain to her right anterior thigh there is edema of both legs and she has fear for DVT which she is going to have [place] of and confirm ok, she has increased her weight which is part of her edema I believe as well as her insulin usage. She is advised as well tha   Pain of left hip joint 07/23/2018   Pelvic pain 12/14/2020   Personal history of (healed) traumatic fracture 12/31/2016   Pneumonia    Polyarthralgia 06/11/2018   Primary insomnia 06/05/2015   Primary osteoarthritis involving multiple joints 08/24/2019   Pure hypercholesterolemia 02/11/2016   Purpura ([place]) 11/06/2017   Respiratory failure ([place]) 11/2018   RUQ pain 11/09/2020   Sciatica 11/03/2017   Scoliosis  deformity of spine 11/03/2017   Secondary diabetes mellitus ([place]) 12/24/2017   Senile purpura ([place]) 12/04/2017   Sepsis ([place]) 07/18/2020   Smoking greater than 30 pack years 02/26/2016   Tobacco abuse 02/11/2016   Tobacco user 02/11/2016   Trichotillomania    hx.    Trigger thumb of right hand 58/85/0277   Uncomplicated type 1 diabetes mellitus ([place]) 10/22/2020    Family History  Problem Relation Age of Onset   Diabetes Mother    Cancer Mother    Hypertension Father    Heart disease Father    Diabetes Father    Healthy Brother    Healthy Son    Diabetes Son    Healthy Daughter    Past Surgical History:  Procedure Laterality Date   ABDOMINAL EXPOSURE N/A 01/11/2020   Procedure: ABDOMINAL EXPOSURE;  Surgeon: Marty Heck, MD;  Location: [facility];  Service: Vascular;  Laterality: N/A;   ANTERIOR LUMBAR FUSION N/A 01/11/2020   Procedure: ANTERIOR LUMBAR INTERBODY FUSION  LUMBAR FIVE-SACRAL ONE, LEFT LUMBAR TWO-THREE FORAMINOTOMY, DISECTOMY;  Surgeon: Melina Schools, MD;  Location: [facility];  Service: Orthopedics;  Laterality: N/A;   APPENDECTOMY  04/28/1970   BACK SURGERY  2016   CARDIAC CATHETERIZATION  12/31/2017   COLONOSCOPY  04/28/2017   EXCISION/RELEASE BURSA HIP Left 04/14/2018   Procedure: Excision trochanteric bursa left hip;  Surgeon: Susa Day, MD;  Location: [facility];  Service: Orthopedics;  Laterality: Left;  60 mins   HAND SURGERY     HIP OPEN REDUCTION Right    ORIF   INTRAVASCULAR PRESSURE WIRE/FFR STUDY N/A 05/06/2021   Procedure: INTRAVASCULAR PRESSURE WIRE/FFR STUDY;  Surgeon: Nelva Bush, MD;  Location: [facility];  Service: Cardiovascular;  Laterality: N/A;   KNEE ARTHROSCOPY     LEFT HEART CATH AND CORONARY ANGIOGRAPHY N/A 12/31/2017   Procedure: LEFT HEART CATH AND CORONARY ANGIOGRAPHY;  Surgeon: Martinique, Peter M, MD;  Location: [facility];  Service: Cardiovascular;  Laterality: N/A;   LEFT HEART CATH AND CORONARY ANGIOGRAPHY N/A 05/06/2021   Procedure: LEFT HEART CATH AND CORONARY ANGIOGRAPHY;  Surgeon: Nelva Bush, MD;  Location: [facility];  Service: Cardiovascular;  Laterality: N/A;   LUMBAR LAMINECTOMY/DECOMPRESSION MICRODISCECTOMY Left 05/10/2014   Procedure: MICRODISCECTOMY LUMBAR DECOMPRESSION L5-S1 LEFT ;  Surgeon: Johnn Hai, MD;  Location: [facility];  Service: Orthopedics;  Laterality: Left;   LUMBAR LAMINECTOMY/DECOMPRESSION MICRODISCECTOMY N/A 01/11/2020   Procedure: LUMBAR LAMINECTOMY/DECOMPRESSION MICRODISCECTOMY LEFT LUMBAR TWO-THREE;  Surgeon: Melina Schools, MD;  Location: [facility];  Service: Orthopedics;  Laterality: N/A;   ORIF WRIST FRACTURE Right    retained hardware   ORTHOPAEDIC SURGERY  04/14/2018   TUBAL LIGATION  12/25/1995   Social History   Social  History Narrative   Not on file   Immunization History  Administered Date(s) Administered   Influenza,inj,Quad PF,6+ Mos 01/21/2016, 12/31/2016, 02/09/2018, 01/18/2021   Influenza,inj,quad, With Preservative 12/27/2016   Influenza-Unspecified 01/21/2016, 12/31/2016   Moderna Sars-Covid-2 Vaccination 04/29/2019, 05/30/2019   Pneumococcal Conjugate-13 07/23/2017   Pneumococcal Polysaccharide-23 02/01/2021     Objective: Vital Signs: LMP 05/04/2008    Physical Exam   Musculoskeletal Exam: ***  CDAI Exam: CDAI Score: -- Patient Global: --; Provider Global: -- Swollen: --; Tender: -- Joint Exam 07/17/2021   No joint exam has been documented for this visit   There is currently no information documented on the homunculus. Go to the Rheumatology activity and complete the homunculus joint exam.  Investigation: No  additional findings.  Imaging: No results found.  Recent Labs: Lab Results  Component Value Date   WBC 9.8 01/21/2020   HGB 9.3 (L) 01/21/2020   PLT 249 01/21/2020   NA 137 01/21/2020   K 4.0 01/21/2020   CL 98 01/21/2020   CO2 29 01/21/2020   GLUCOSE 120 (H) 01/21/2020   BUN 12 01/21/2020   CREATININE 0.54 01/21/2020   BILITOT 1.5 (H) 01/13/2020   ALKPHOS 68 01/13/2020   AST 35 01/13/2020   ALT 31 01/13/2020   PROT 6.1 06/04/2021   ALBUMIN 2.4 (L) 01/13/2020   CALCIUM 8.7 (L) 01/21/2020   GFRAA >60 01/21/2020   June 04, 2021 SPEP negative, antigliadin negative, anti-tTG negative, TSH normal, vitamin D 35, PTH 80  Speciality Comments: No specialty comments available.  Procedures:  No procedures performed Allergies: Celebrex [celecoxib] and Mobic [meloxicam]   Assessment / Plan:     Visit Diagnoses: No diagnosis found.  Orders: No orders of the defined types were placed in this encounter.  No orders of the defined types were placed in this encounter.   Face-to-face time spent with patient was *** minutes. Greater than 50% of time was spent  in counseling and coordination of care.  Follow-Up Instructions: No follow-ups on file.   Bo Merino, MD  Note - This record has been created using Editor, commissioning.  Chart creation errors have been sought, but may not always  have been located. Such creation errors do not reflect on  the standard of medical care.

## 2021-07-11 ENCOUNTER — Telehealth: Payer: Self-pay | Admitting: Internal Medicine

## 2021-07-11 NOTE — Telephone Encounter (Signed)
Patient has done PFT and [facility] is following up to see if she is cleared for surgery. Please advise ? ? ?Fax received from Dr. Sherley Bounds to perform a L4-5 lumbar laminectomy on patient.  Patient needs surgery clearance. Patient was seen on 03/08/2020 and is scheduled for an OV on 06/04/21 at 12 pm with Dr. Shearon Stalls. Office protocol is a risk assessment can be sent to surgeon if patient has been seen in 60 days or less.  ?  ?Sending to Dr. Shearon Stalls for risk assessment  ?

## 2021-07-11 NOTE — Telephone Encounter (Signed)
Patient due for L4-L5 spine surgery. See my recommendations below.  ? ?ASSESSMENT AND RECOMMENDATIONS:  ?Preoperative Risk Calculation: ?The features of this patient's history that contribute to the pulmonary risk assessment include: Age, smoking use, duration of surgery and preoperative anemia.  ? ?This patient has a 13.3% (30 points) risk of post-operative pulmonary complications by ARISCAT Index.  The absolute assessment of risk/benefit of the procedure is deferred to the primary team's evaluation. ? ?- Patient's Estimated risk of postoperative respiratory failure is intermediate based on the ARISCAT Index.  ? ?0 to 25 points: Low risk: 1.6% pulmonary complication rate  ?26 to 44 points: Intermediate risk: 10.9% pulmonary complication rate  ?45 to 123 points: High risk: 60.4% pulmonary complication rate ? ?Postoperative respiratory failure (PRF) is considered as failure to wean from mechanical ventilation within 48 hours of surgery or unplanned intubation/reintubation postoperatively. The validated risk calculator provides a risk estimate of PRF and is anticipated to aid in surgical decision-making and informed patient consent.  ?However risk can be accepted given the potential benefit of this intervention and it is not prohibitive. ? ?RECOMMENDATIONS:  ?In order to minimize the risk of complications and optimize pulmonary status, we recommend the following: ? ?- Encourage aggressive incentive spirometry hourly both peri-operatively and post-operatively as tolerated  ?- Early ambulation and physical therapy as tolerated post-operatively ?- Adequate pain control especially in the setting of abdominal and thoracic surgery ?- Bronchodilators as needed for wheezing or shortness of breath ?- Oral steroids only if the patient appears to have component of COPD exacerbation, otherwise no routine utilization needed ?- Ideally we recommend smoking cessation for >4 weeks before any intervention ?- Intraoperatively keep OR  time to the shortest as possible ? ? ?ARISCAT: Mazo et al. Anesthesiology [id number] ? ?Lenice Llamas, MD ?Pulmonary and Critical Care Medicine ?[place] ?07/11/2021 8:21 PM ?Pager: see AMION ? ?

## 2021-07-15 NOTE — Telephone Encounter (Signed)
OV Notes and clearance form have been faxed back to [facility]. Nothing further needed at this time.  ?

## 2021-07-15 NOTE — Telephone Encounter (Signed)
Called and spoke with patient to let her know that I faxed her stuff over to [facility] on Friday. She expressed understanding. Nothing further needed at this time.  ?

## 2021-07-17 ENCOUNTER — Ambulatory Visit: Payer: BC Managed Care – PPO | Admitting: Rheumatology

## 2021-07-17 DIAGNOSIS — M65311 Trigger thumb, right thumb: Secondary | ICD-10-CM

## 2021-07-17 DIAGNOSIS — M810 Age-related osteoporosis without current pathological fracture: Secondary | ICD-10-CM

## 2021-07-17 DIAGNOSIS — M5416 Radiculopathy, lumbar region: Secondary | ICD-10-CM

## 2021-07-17 DIAGNOSIS — Z8659 Personal history of other mental and behavioral disorders: Secondary | ICD-10-CM

## 2021-07-17 DIAGNOSIS — Z8719 Personal history of other diseases of the digestive system: Secondary | ICD-10-CM

## 2021-07-17 DIAGNOSIS — I25118 Atherosclerotic heart disease of native coronary artery with other forms of angina pectoris: Secondary | ICD-10-CM

## 2021-07-17 DIAGNOSIS — E7800 Pure hypercholesterolemia, unspecified: Secondary | ICD-10-CM

## 2021-07-17 DIAGNOSIS — M4326 Fusion of spine, lumbar region: Secondary | ICD-10-CM

## 2021-07-17 DIAGNOSIS — L570 Actinic keratosis: Secondary | ICD-10-CM

## 2021-07-17 DIAGNOSIS — F17210 Nicotine dependence, cigarettes, uncomplicated: Secondary | ICD-10-CM

## 2021-07-17 DIAGNOSIS — E109 Type 1 diabetes mellitus without complications: Secondary | ICD-10-CM

## 2021-07-17 DIAGNOSIS — M65312 Trigger thumb, left thumb: Secondary | ICD-10-CM

## 2021-07-17 DIAGNOSIS — J449 Chronic obstructive pulmonary disease, unspecified: Secondary | ICD-10-CM

## 2021-07-17 DIAGNOSIS — D692 Other nonthrombocytopenic purpura: Secondary | ICD-10-CM

## 2021-08-09 ENCOUNTER — Other Ambulatory Visit: Payer: Self-pay | Admitting: Neurological Surgery

## 2021-09-05 ENCOUNTER — Telehealth: Payer: Self-pay | Admitting: *Deleted

## 2021-09-05 NOTE — Telephone Encounter (Signed)
? ?  Pre-operative Risk Assessment  ?  ?Patient Name: [name]  ?DOB: 1961/02/07 ?MRN: [id number]  ? ?  ? ?Request for Surgical Clearance   ? ?Procedure:   L4-5 LUMBAR LAMINECTOMY ? ?Date of Surgery:  Clearance 09/18/21                              ?   ?Surgeon:  DR. Sherley Bounds ?Surgeon's Group or Practice Name:  [facility] ?Phone number:  [phone] ATTN: VANESSA [phone] ?Fax number:  [phone]  ?  ?Type of Clearance Requested:   ?- Medical   HOLD ASA ?  ?Type of Anesthesia:  General  ?  ?Additional requests/questions:   ? ?Signed, ?Julaine Hua   ?09/05/2021, 1:44 PM  ? ?

## 2021-09-05 NOTE — Telephone Encounter (Signed)
? ?  Primary Cardiologist: [name], MD ? ?Chart reviewed as part of pre-operative protocol coverage. Given past medical history and time since last visit, based on ACC/AHA guidelines, [name] would be at acceptable risk for the planned procedure without further cardiovascular testing.  ? ?Her aspirin may be held for 5-7 days prior to her procedure.  Please resume as soon as hemostasis is achieved. ? ?I will route this recommendation to the requesting party via Epic fax function and remove from pre-op pool. ? ?Please call with questions. ? ?Jossie Ng. Michie Molnar NP-C ? ?  ?09/05/2021, 3:32 PM ?[place] ?[address] ?Office [phone] Fax [phone] ? ? ? ? ?

## 2021-09-10 NOTE — Pre-Procedure Instructions (Signed)
Surgical Instructions ? ? ? Your procedure is scheduled on Wednesday 09/18/21. ? ? Report to [facility] Main Entrance "A" at 09:00 A.M., then check in with the Admitting office. ? Call this number if you have problems the morning of surgery: ? [phone] ? ? If you have any questions prior to your surgery date call [phone]: Open Monday-Friday 8am-4pm ? ? ? Remember: ? Do not eat or drink after midnight the night before your surgery ?  ? Take these medicines the morning of surgery with A SIP OF WATER:  ? buPROPion [facility] SR) ? metoprolol tartrate (LOPRESSOR) ? ? Take these medicines if needed:  ? nitroGLYCERIN (NITROSTAT) ? ?As of today, STOP taking any Aspirin (unless otherwise instructed by your surgeon) Aleve, Naproxen, Ibuprofen, Motrin, Advil, Goody's, BC's, all herbal medications, fish oil, and all vitamins. ? ?WHAT DO I DO ABOUT MY DIABETES MEDICATION? ? ? ?Do not take oral diabetes medicines (pills) the morning of surgery. ? ?  ? ?THE MORNING OF SURGERY, DO NOT TAKE insulin aspart (NOVOLOG FLEXPEN).  ? ? The morning of surgery take 16 units of insulin degludec (TRESIBA FLEXTOUCH). This is half of your normal dose.  ? ?The day of surgery, do not take other diabetes injectables, including Byetta (exenatide), Bydureon (exenatide ER), Victoza (liraglutide), or Trulicity (dulaglutide). ? ?If your CBG is greater than 220 mg/dL, you may take ? of your sliding scale (correction) dose of insulin. ? ? ?HOW TO MANAGE YOUR DIABETES ?BEFORE AND AFTER SURGERY ? ?Why is it important to control my blood sugar before and after surgery? ?Improving blood sugar levels before and after surgery helps healing and can limit problems. ?A way of improving blood sugar control is eating a healthy diet by: ? Eating less sugar and carbohydrates ? Increasing activity/exercise ? Talking with your doctor about reaching your blood sugar goals ?High blood sugars (greater than 180 mg/dL) can raise your risk of infections and slow  your recovery, so you will need to focus on controlling your diabetes during the weeks before surgery. ?Make sure that the doctor who takes care of your diabetes knows about your planned surgery including the date and location. ? ?How do I manage my blood sugar before surgery? ?Check your blood sugar at least 4 times a day, starting 2 days before surgery, to make sure that the level is not too high or low. ? ?Check your blood sugar the morning of your surgery when you wake up and every 2 hours until you get to the Short Stay unit. ? ?If your blood sugar is less than 70 mg/dL, you will need to treat for low blood sugar: ?Do not take insulin. ?Treat a low blood sugar (less than 70 mg/dL) with ? cup of clear juice (cranberry or apple), 4 glucose tablets, OR glucose gel. ?Recheck blood sugar in 15 minutes after treatment (to make sure it is greater than 70 mg/dL). If your blood sugar is not greater than 70 mg/dL on recheck, call [phone] for further instructions. ?Report your blood sugar to the short stay nurse when you get to Short Stay. ? ?If you are admitted to the hospital after surgery: ?Your blood sugar will be checked by the staff and you will probably be given insulin after surgery (instead of oral diabetes medicines) to make sure you have good blood sugar levels. ?The goal for blood sugar control after surgery is 80-180 mg/dL.  ? ?         ?Do not wear jewelry or  makeup ?Do not wear lotions, powders, perfumes/colognes, or deodorant. ?Do not shave 48 hours prior to surgery.  Men may shave face and neck. ?Do not bring valuables to the hospital. ?Do not wear nail polish, gel polish, artificial nails, or any other type of covering on natural nails (fingers and toes) ?If you have artificial nails or gel coating that need to be removed by a nail salon, please have this removed prior to surgery. Artificial nails or gel coating may interfere with anesthesia's ability to adequately monitor your vital signs. ? ?[facility] is not responsible for any belongings or valuables. .  ? ?Do NOT Smoke (Tobacco/Vaping)  24 hours prior to your procedure ? ?If you use a CPAP at night, you may bring your mask for your overnight stay. ?  ?Contacts, glasses, hearing aids, dentures or partials may not be worn into surgery, please bring cases for these belongings ?  ?For patients admitted to the hospital, discharge time will be determined by your treatment team. ?  ?Patients discharged the day of surgery will not be allowed to drive home, and someone needs to stay with them for 24 hours. ? ? ?SURGICAL WAITING ROOM VISITATION ?Patients having surgery or a procedure in a hospital may have two support people. ?Children under the age of 56 must have an adult with them who is not the patient. ?They may stay in the waiting area during the procedure and may switch out with other visitors. If the patient needs to stay at the hospital during part of their recovery, the visitor guidelines for inpatient rooms apply. ? ?Please refer to the [place] website for the visitor guidelines for Inpatients (after your surgery is over and you are in a regular room).  ? ? ? ? ? ?Special instructions:   ? ?Oral Hygiene is also important to reduce your risk of infection.  Remember - BRUSH YOUR TEETH THE MORNING OF SURGERY WITH YOUR REGULAR TOOTHPASTE ? ? ?[place] [facility]- Preparing For Surgery ? ?Before surgery, you can play an important role. Because skin is not sterile, your skin needs to be as free of germs as possible. You can reduce the number of germs on your skin by washing with CHG (chlorahexidine gluconate) Soap before surgery.  CHG is an antiseptic cleaner which kills germs and bonds with the skin to continue killing germs even after washing.   ? ? ?Please do not use if you have an allergy to CHG or antibacterial soaps. If your skin becomes reddened/irritated stop using the CHG.  ?Do not shave (including legs and underarms) for at least 48 hours prior to first  CHG shower. It is OK to shave your face. ? ?Please follow these instructions carefully. ?  ? ? Shower the NIGHT BEFORE SURGERY and the MORNING OF SURGERY with CHG Soap.  ? If you chose to wash your hair, wash your hair first as usual with your normal shampoo. After you shampoo, rinse your hair and body thoroughly to remove the shampoo.  Then [employer] and genitals (private parts) with your normal soap and rinse thoroughly to remove soap. ? ?After that Use CHG Soap as you would any other liquid soap. You can apply CHG directly to the skin and wash gently with a scrungie or a clean washcloth.  ? ?Apply the CHG Soap to your body ONLY FROM THE NECK DOWN.  Do not use on open wounds or open sores. Avoid contact with your eyes, ears, mouth and genitals (private parts). Wash Face  and genitals (private parts)  with your normal soap.  ? ?Wash thoroughly, paying special attention to the area where your surgery will be performed. ? ?Thoroughly rinse your body with warm water from the neck down. ? ?DO NOT shower/wash with your normal soap after using and rinsing off the CHG Soap. ? ?Pat yourself dry with a CLEAN TOWEL. ? ?Wear CLEAN PAJAMAS to bed the night before surgery ? ?Place CLEAN SHEETS on your bed the night before your surgery ? ?DO NOT SLEEP WITH PETS. ? ? ?Day of Surgery: ? ?Take a shower with CHG soap. ?Wear Clean/Comfortable clothing the morning of surgery ?Do not apply any deodorants/lotions.   ?Remember to brush your teeth WITH YOUR REGULAR TOOTHPASTE. ? ? ? ?If you received a COVID test during your pre-op visit, it is requested that you wear a mask when out in public, stay away from anyone that may not be feeling well, and notify your surgeon if you develop symptoms. If you have been in contact with anyone that has tested positive in the last 10 days, please notify your surgeon. ? ?  ?Please read over the following fact sheets that you were given.  ? ?

## 2021-09-11 ENCOUNTER — Encounter (HOSPITAL_COMMUNITY)
Admission: RE | Admit: 2021-09-11 | Discharge: 2021-09-11 | Disposition: A | Discharge status: Home / Self Care | Payer: BC Managed Care – PPO | Source: Ambulatory Visit | Attending: Neurological Surgery | Admitting: Neurological Surgery

## 2021-09-11 ENCOUNTER — Encounter (HOSPITAL_COMMUNITY): Payer: Self-pay

## 2021-09-11 ENCOUNTER — Other Ambulatory Visit: Payer: Self-pay

## 2021-09-11 VITALS — BP 106/63 | HR 66 | Temp 98.3°F | Resp 17 | Ht 62.0 in | Wt 150.0 lb

## 2021-09-11 DIAGNOSIS — Z7982 Long term (current) use of aspirin: Secondary | ICD-10-CM | POA: Diagnosis not present

## 2021-09-11 DIAGNOSIS — E109 Type 1 diabetes mellitus without complications: Secondary | ICD-10-CM | POA: Insufficient documentation

## 2021-09-11 DIAGNOSIS — F17200 Nicotine dependence, unspecified, uncomplicated: Secondary | ICD-10-CM | POA: Insufficient documentation

## 2021-09-11 DIAGNOSIS — Z8701 Personal history of pneumonia (recurrent): Secondary | ICD-10-CM | POA: Diagnosis not present

## 2021-09-11 DIAGNOSIS — I2510 Atherosclerotic heart disease of native coronary artery without angina pectoris: Secondary | ICD-10-CM | POA: Insufficient documentation

## 2021-09-11 DIAGNOSIS — Z01818 Encounter for other preprocedural examination: Secondary | ICD-10-CM

## 2021-09-11 DIAGNOSIS — Z01812 Encounter for preprocedural laboratory examination: Secondary | ICD-10-CM | POA: Diagnosis not present

## 2021-09-11 LAB — CBC
HCT: 42.2 % (ref 36.0–46.0)
Hemoglobin: 14 g/dL (ref 12.0–15.0)
MCH: 32.6 pg (ref 26.0–34.0)
MCHC: 33.2 g/dL (ref 30.0–36.0)
MCV: 98.4 fL (ref 80.0–100.0)
Platelets: 219 10*3/uL (ref 150–400)
RBC: 4.29 MIL/uL (ref 3.87–5.11)
RDW: 13.3 % (ref 11.5–15.5)
WBC: 7 10*3/uL (ref 4.0–10.5)
nRBC: 0 % (ref 0.0–0.2)

## 2021-09-11 LAB — PROTIME-INR
INR: 1.1 (ref 0.8–1.2)
Prothrombin Time: 14.2 seconds (ref 11.4–15.2)

## 2021-09-11 LAB — GLUCOSE, CAPILLARY: Glucose-Capillary: 144 mg/dL — ABNORMAL HIGH (ref 70–99)

## 2021-09-11 LAB — HEMOGLOBIN A1C
Hgb A1c MFr Bld: 7.3 % — ABNORMAL HIGH (ref 4.8–5.6)
Mean Plasma Glucose: 162.81 mg/dL

## 2021-09-11 LAB — SURGICAL PCR SCREEN
MRSA, PCR: NEGATIVE
Staphylococcus aureus: NEGATIVE

## 2021-09-11 LAB — BASIC METABOLIC PANEL
Anion gap: 5 (ref 5–15)
BUN: 9 mg/dL (ref 8–23)
CO2: 27 mmol/L (ref 22–32)
Calcium: 9.2 mg/dL (ref 8.9–10.3)
Chloride: 106 mmol/L (ref 98–111)
Creatinine, Ser: 0.64 mg/dL (ref 0.44–1.00)
GFR, Estimated: >60 mL/min (ref >60)
Glucose, Bld: 150 mg/dL — ABNORMAL HIGH (ref 70–99)
Potassium: 4.2 mmol/L (ref 3.5–5.1)
Sodium: 138 mmol/L (ref 135–145)

## 2021-09-11 NOTE — Pre-Procedure Instructions (Addendum)
Surgical Instructions ? ? ? Your procedure is scheduled on Wednesday 09/18/21. ? ? Report to [facility]tr[facility] "A" at 09:00 A.M., then check in with the Admitting office. ? Call this number if you have problems the morning of surgery: ? [phone] ? ? If you have any questions prior to your surgery date call [phone]: Open Monday-Friday 8am-4pm ? ? ? Remember: ? Do not eat or drink after midnight the night before your surgery ?  ? Take these medicines the morning of surgery with A SIP OF WATER:  ? buPROPion [facility] SR) ? metoprolol tartrate (LOPRESSOR) ? ? Take these medicines if needed:  ? nitroGLYCERIN (NITROSTAT) ? Tylenol ? ?As of today, STOP taking any Aspirin (unless otherwise instructed by your surgeon) Aleve, Naproxen, Ibuprofen, Motrin, Advil, Goody's, BC's, all herbal medications, fish oil, and all vitamins. ? ?WHAT DO I DO ABOUT MY DIABETES MEDICATION? ? ? ?Do not take oral diabetes medicines (pills) the morning of surgery. ? ?  ?THE NIGHT BEFORE SURGERY, DO NOT TAKE bedtime dose of insulin aspart (NOVOLOG FLEXPEN).  ? ?The NIGHT BEFORE SURGERY, take 25 units of insulin degludec (TRESIBA FLEXTOUCH). This is 80% of your normal dose.  ? ?The MORNING OF SURGERY, take 25 units of insulin degludec (TRESIBA FLEXTOUCH). This is 80% of your normal dose.  ? ?If your CBG is greater than 220 mg/dL, you may take ? of your sliding scale (correction) dose of insulin. ? ? ?HOW TO MANAGE YOUR DIABETES ?BEFORE AND AFTER SURGERY ? ?Why is it important to control my blood sugar before and after surgery? ?Improving blood sugar levels before and after surgery helps healing and can limit problems. ?A way of improving blood sugar control is eating a healthy diet by: ? Eating less sugar and carbohydrates ? Increasing activity/exercise ? Talking with your doctor about reaching your blood sugar goals ?High blood sugars (greater than 180 mg/dL) can raise your risk of infections and slow your recovery, so you will  need to focus on controlling your diabetes during the weeks before surgery. ?Make sure that the doctor who takes care of your diabetes knows about your planned surgery including the date and location. ? ?How do I manage my blood sugar before surgery? ?Check your blood sugar at least 4 times a day, starting 2 days before surgery, to make sure that the level is not too high or low. ? ?Check your blood sugar the morning of your surgery when you wake up and every 2 hours until you get to the Short Stay unit. ? ?If your blood sugar is less than 70 mg/dL, you will need to treat for low blood sugar: ?Do not take insulin. ?Treat a low blood sugar (less than 70 mg/dL) with ? cup of clear juice (cranberry or apple), 4 glucose tablets, OR glucose gel. ?Recheck blood sugar in 15 minutes after treatment (to make sure it is greater than 70 mg/dL). If your blood sugar is not greater than 70 mg/dL on recheck, call [phone] for further instructions. ?Report your blood sugar to the short stay nurse when you get to Short Stay. ? ?If you are admitted to the hospital after surgery: ?Your blood sugar will be checked by the staff and you will probably be given insulin after surgery (instead of oral diabetes medicines) to make sure you have good blood sugar levels. ?The goal for blood sugar control after surgery is 80-180 mg/dL.  ? ?         DAY OF SURGERY: ?  Do not wear jewelry or makeup ?Do not wear lotions, powders, perfumes, or deodorant. ?Do not shave 48 hours prior to surgery.  ?Do not bring valuables to the hospital. ?Do not wear nail polish, gel polish, artificial nails, or any other type of covering on natural nails (fingers and toes) ?If you have artificial nails or gel coating that need to be removed by a nail salon, please have this removed prior to surgery. Artificial nails or gel coating may interfere with anesthesia's ability to adequately monitor your vital signs. ? ?[facility] is not responsible for any belongings or  valuables. .  ? ?Do NOT Smoke (Tobacco/Vaping)  24 hours prior to your procedure ? ?If you use a CPAP at night, you may bring your mask for your overnight stay. ?  ?Contacts, glasses, hearing aids, dentures or partials may not be worn into surgery, please bring cases for these belongings ?  ?For patients admitted to the hospital, discharge time will be determined by your treatment team. ?  ?Patients discharged the day of surgery will not be allowed to drive home, and someone needs to stay with them for 24 hours. ? ? ?SURGICAL WAITING ROOM VISITATION ?Patients having surgery or a procedure in a hospital may have two support people. ?Children under the age of 69 must have an adult with them who is not the patient. ?They may stay in the waiting area during the procedure and may switch out with other visitors. If the patient needs to stay at the hospital during part of their recovery, the visitor guidelines for inpatient rooms apply. ? ?Please refer to the [place] website for the visitor guidelines for Inpatients (after your surgery is over and you are in a regular room).  ? ? ?Special instructions:   ? ?Oral Hygiene is also important to reduce your risk of infection.  Remember - BRUSH YOUR TEETH THE MORNING OF SURGERY WITH YOUR REGULAR TOOTHPASTE ? ? ?[place]- Preparing For Surgery ? ?Before surgery, you can play an important role. Because skin is not sterile, your skin needs to be as free of germs as possible. You can reduce the number of germs on your skin by washing with CHG (chlorahexidine gluconate) Soap before surgery.  CHG is an antiseptic cleaner which kills germs and bonds with the skin to continue killing germs even after washing.   ? ? ?Please do not use if you have an allergy to CHG or antibacterial soaps. If your skin becomes reddened/irritated stop using the CHG.  ?Do not shave (including legs and underarms) for at least 48 hours prior to first CHG shower. It is OK to shave your face. ? ?Please  follow these instructions carefully. ?  ? ? Shower the NIGHT BEFORE SURGERY and the MORNING OF SURGERY with CHG Soap.  ? If you chose to wash your hair, wash your hair first as usual with your normal shampoo. After you shampoo, rinse your hair and body thoroughly to remove the shampoo.  Then [employer] and genitals (private parts) with your normal soap and rinse thoroughly to remove soap. ? ?After that Use CHG Soap as you would any other liquid soap. You can apply CHG directly to the skin and wash gently with a scrungie or a clean washcloth.  ? ?Apply the CHG Soap to your body ONLY FROM THE NECK DOWN.  Do not use on open wounds or open sores. Avoid contact with your eyes, ears, mouth and genitals (private parts). Wash Face and genitals (private parts)  with your normal soap.  ? ?Wash thoroughly, paying special attention to the area where your surgery will be performed. ? ?Thoroughly rinse your body with warm water from the neck down. ? ?DO NOT shower/wash with your normal soap after using and rinsing off the CHG Soap. ? ?Pat yourself dry with a CLEAN TOWEL. ? ?Wear CLEAN PAJAMAS to bed the night before surgery ? ?Place CLEAN SHEETS on your bed the night before your surgery ? ?DO NOT SLEEP WITH PETS. ? ? ?Day of Surgery: ? ?Take a shower with CHG soap. ?Wear Clean/Comfortable clothing the morning of surgery ?Do not apply any deodorants/lotions.   ?Remember to brush your teeth WITH YOUR REGULAR TOOTHPASTE. ? ? ?Please read over the following fact sheets that you were given.  ? ?

## 2021-09-11 NOTE — Progress Notes (Addendum)
PCP - Gilford Rile ?Cardiologist - Dr. Agustin Cree (cardiac clearance received on 09/05/21) ?Pulmonologist - Dr. Shearon Stalls (received clearance on 06/04/21) ? ?Pt stated that after back fusion surgery in 2021, the day after surgery she had respiratory depression, and possibly aspirated (per her doctor).  Ebony Hail made aware.  Chart sent to anesthesia.  ? ?Chest x-ray - n/a ?EKG - 05/07/21 ?ECHO - 01/14/20 ?Cardiac Cath - 05/06/21 ? ?DM - Type 1 ?Fasting Blood Sugar - 70-150 ?Pt wears Glucose monitor.  Pt told to wear monitor on DOS, and may possibly have to remove for surgery.  Pt stated understanding ? ? ?Anesthesia review: yes, see above note, heart history, abnormal Hgb 9.3 @ PAT appointment (Dr. Ronnald Ramp notified via in box message) ? ?Patient denies shortness of breath, fever, cough and chest pain at PAT appointment ? ? ?All instructions explained to the patient, with a verbal understanding of the material. Patient agrees to go over the instructions while at home for a better understanding. Patient also instructed to self quarantine after being tested for COVID-19. The opportunity to ask questions was provided. ? ? ?

## 2021-09-12 NOTE — Progress Notes (Signed)
Anesthesia Chart Review:  Recently evaluated by cardiology for chest pain.  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  Recommendation was continue medical therapy for anginal relief. Cardiac clearance per telephone encounter 09/05/21, "Chart reviewed as part of pre-operative protocol coverage. Given past medical history and time since last visit, based on ACC/AHA guidelines, [name] would be at acceptable risk for the planned procedure without further cardiovascular testing. Her aspirin may be held for 5-7 days prior to her procedure.  Please resume as soon as hemostasis is achieved."  She has history of aspiration pneumonia while kayaking and inhaling water. This was in 2020 at [facility]. She was on BIPAP but never intubated. Was in the ICU for 8 days. In 2021 she had surgery for her back and was hospitalized at [facility]. She had an uncomplicated surgery and then had hypotension post-operative and nad worsening post-operative atelectasis and was on 15LNC and maintained in the ICU. Patient recently evaluated by pulmonologist Dr. Shearon Stalls on 06/04/2021.  Per note, "Concern for COPD - PFTs 2021 reviewed and normal pulmonary function. However has had an episode of bronchitis since then. Will obtain repeat PFTs. Despite normal pulmonary function has already had an episode of post-operative respiratory failure.  I think in order to best reduce her risk for this, recommend quitting smoking 30 days prior to any elective surgeries. Will prescribe wellbutrin for smoking cessation. I'll see her in a month to follow up."  Preop pulmonary risk assessment per telephone encounter 07/11/21: Preoperative Risk Calculation: The features of this patient's history that contribute to the pulmonary risk assessment include: Age, smoking use, duration of surgery and preoperative anemia.    This patient has a 13.3% (30 points) risk of post-operative pulmonary complications by ARISCAT Index.   The absolute assessment of risk/benefit of the procedure is deferred to the primary team's evaluation.   - Patient's Estimated risk of postoperative respiratory failure is intermediate based on the ARISCAT Index.    0 to 25 points: Low risk: 3.1% pulmonary complication rate  26 to 44 points: Intermediate risk: 49.7% pulmonary complication rate  45 to 123 points: High risk: 02.6% pulmonary complication rate   Postoperative respiratory failure (PRF) is considered as failure to wean from mechanical ventilation within 48 hours of surgery or unplanned intubation/reintubation postoperatively. The validated risk calculator provides a risk estimate of PRF and is anticipated to aid in surgical decision-making and informed patient consent.  However risk can be accepted given the potential benefit of this intervention and it is not prohibitive.   RECOMMENDATIONS:  In order to minimize the risk of complications and optimize pulmonary status, we recommend the following:   - Encourage aggressive incentive spirometry hourly both peri-operatively and post-operatively as tolerated  - Early ambulation and physical therapy as tolerated post-operatively - Adequate pain control especially in the setting of abdominal and thoracic surgery - Bronchodilators as needed for wheezing or shortness of breath - Oral steroids only if the patient appears to have component of COPD exacerbation, otherwise no routine utilization needed - Ideally we recommend smoking cessation for >4 weeks before any intervention - Intraoperatively keep OR time to the shortest as possible   Type 1 diabetes, A1c 7.3 on preop labs.  Preop labs reviewed, unremarkable.  EKG 05/06/2021: NSR.  Rate 67.  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  No  significant CAD observed in the LCx or RCA. Normal left ventricular systolic function with upper normal filling pressure.   Recommendations: Continue medical therapy for anginal relief and to prevent progression of nonobstructive CAD.  TTE 01/14/2020:  1. Left ventricular ejection fraction, by estimation, is 60 to 65%. The  left ventricle has normal function. The left ventricle has no regional  wall motion abnormalities. Left ventricular diastolic parameters were  normal.   2. Right ventricular systolic function is normal. The right ventricular  size is normal.   3. The mitral valve is normal in structure. No evidence of mitral valve  regurgitation. No evidence of mitral stenosis.   4. The aortic valve is normal in structure. Aortic valve regurgitation is  not visualized. No aortic stenosis is present.   5. The inferior vena cava is normal in size with greater than 50%  respiratory variability, suggesting right atrial pressure of 3 mmHg.     Wynonia Musty [facility] Short Stay Center/Anesthesiology Phone [phone] 09/12/2021 12:55 PM

## 2021-09-12 NOTE — Anesthesia Preprocedure Evaluation (Addendum)
Anesthesia Evaluation  Patient identified by MRN, date of birth, ID band Patient awake    Reviewed: Allergy & Precautions, NPO status , Patient's Chart, lab work & pertinent test results, reviewed documented beta blocker date and time   History of Anesthesia Complications (+) history of anesthetic complications (post op pneumonia)  Airway Mallampati: II  TM Distance: >3 FB Neck ROM: Full    Dental  (+) Dental Advisory Given   Pulmonary COPD, Current Smoker and Patient abstained from smoking.,    breath sounds clear to auscultation       Cardiovascular hypertension, Pt. on medications and Pt. on home beta blockers (-) angina+ CAD (non-obstructive)   Rhythm:Regular Rate:Normal  04/2021 cath: 1. Mild-moderate distal LMCA and ostial/proximal LAD disease of up to 40% in the ostial LAD, which is not hemodynamically significant (RFR = 0.91).  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  No significant CAD observed in the LCx or RCA. 2. Normal left ventricular systolic function with upper normal filling pressure.  Recommendations: Continue medical therapy for anginal relief and to prevent progression of nonobstructive CAD.  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), Insulin Dependent  Renal/GU negative Renal ROS     Musculoskeletal   Abdominal   Peds  Hematology   Anesthesia Other Findings   Reproductive/Obstetrics                           Anesthesia Physical Anesthesia Plan  ASA: 3  Anesthesia Plan: General   Post-op Pain Management: Ofirmev IV (intra-op)*   Induction:   PONV Risk Score and Plan: 2 and Ondansetron and Dexamethasone  Airway Management Planned: Oral ETT  Additional Equipment: None  Intra-op  Plan:   Post-operative Plan: Extubation in OR  Informed Consent: I have reviewed the patients History and Physical, chart, labs and discussed the procedure including the risks, benefits and alternatives for the proposed anesthesia with the patient or authorized representative who has indicated his/her understanding and acceptance.     Dental advisory given  Plan Discussed with: CRNA and Surgeon  Anesthesia Plan Comments: (PAT note by Karoline Caldwell, PA-C: Recently evaluated by cardiology for chest pain.  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  Recommendation was continue medical therapy for anginal relief. Cardiac clearance per telephone encounter 09/05/21, "Chart reviewed as part of pre-operative protocol coverage. Given past medical history and time since last visit, based on ACC/AHA guidelines,Natalina A Newlinwould be at acceptable risk for the planned procedure without further cardiovascular testing. Her aspirin may be held for 5-7 days prior to her procedure. Please resume as soon as hemostasis is achieved."  She has history of aspiration pneumonia while kayaking and inhaling water. This was in 2020 at [facility] She was on BIPAP but never intubated. Was in the ICU for 8 days. In 2021 she had surgery for her back and was hospitalized at [facility]. She had an uncomplicated surgery and then had hypotension post-operative and nad worsening post-operative atelectasis and was on 15LNC and maintained in the ICU. Patient recently evaluated by pulmonologist Dr. Shearon Stalls on 06/04/2021.  Per note, "Concern for COPD - PFTs 2021 reviewed and normal pulmonary function. However has had an episode of bronchitis since then. Will obtain repeat PFTs. Despite normal pulmonary function has  already had an episode of post-operative respiratory failure. I think in order to best reduce her risk for this, recommend quitting smoking 30 days prior to any elective surgeries. Will  prescribe wellbutrin for smoking cessation. I'll see her in a month to follow up."  Preop pulmonary risk assessment per telephone encounter 07/11/21: Preoperative Risk Calculation: The features of this patient's history that contribute to the pulmonary risk assessment include: Age,smoking use, duration of surgery and preoperative anemia.  This patient has a13.3% (30 points)risk of post-operative pulmonary complications by ARISCAT Index. The absolute assessment of risk/benefit of the procedure is deferred to the primary team's evaluation.  - Patient's Estimated risk of postoperative respiratory failure isintermediatebased on the ARISCAT Index.   0 to 25 points: Low risk: 9.5% pulmonary complication rate  26 to 44 points: Intermediate risk: 28.4% pulmonary complication rate  45 to 123 points: High risk: 13.2% pulmonary complication rate  Postoperative respiratory failure (PRF) is considered as failure to wean from mechanical ventilation within 48 hours of surgery or unplanned intubation/reintubation postoperatively. The validated risk calculator provides a risk estimate of PRF and is anticipated to aid in surgical decision-making and informed patient consent.  However risk can be accepted given the potential benefit of this intervention and it is not prohibitive.  RECOMMENDATIONS: In order to minimize the risk of complications and optimize pulmonary status, we recommend the following:  - Encourage aggressive incentive spirometry hourly both peri-operatively and post-operatively as tolerated  - Early ambulation and physical therapy as tolerated post-operatively - Adequate pain control especially in the setting of abdominal and thoracic surgery - Bronchodilators as needed for wheezing or shortness of breath - Oral steroids only if the patient appears to have component of COPD exacerbation, otherwise no routine utilization needed - Ideally we recommend smoking cessation for >4 weeks  before any intervention - Intraoperatively keep OR time to the shortest as possible   Type 1 diabetes, A1c 7.3 on preop labs.  Preop labs reviewed, unremarkable.  EKG 05/06/2021: NSR.  Rate 67.  Cath 05/06/2021: Conclusions: 1. Mild-moderate distal LMCA and ostial/proximal LAD disease of up to 40% in the ostial LAD, which is not hemodynamically significant (RFR = 0.91). 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 No significant CAD observed in the LCx or RCA. 2. Normal left ventricular systolic function with upper normal filling pressure.  Recommendations: 1. Continue medical therapy for anginal relief and to prevent progression of nonobstructive CAD.  TTE 01/14/2020: 1. Left ventricular ejection fraction, by estimation, is 60 to 65%. The  left ventricle has normal function. The left ventricle has no regional  wall motion abnormalities. Left ventricular diastolic parameters were  normal.  2. Right ventricular systolic function is normal. The right ventricular  size is normal.  3. The mitral valve is normal in structure. No evidence of mitral valve  regurgitation. No evidence of mitral stenosis.  4. The aortic valve is normal in structure. Aortic valve regurgitation is  not visualized. No aortic stenosis is present.  5. The inferior vena cava is normal in size with greater than 50%  respiratory variability, suggesting right atrial pressure of 3 mmHg.   )      Anesthesia Quick Evaluation

## 2021-09-18 ENCOUNTER — Observation Stay (HOSPITAL_COMMUNITY)
Admission: RE | Admit: 2021-09-18 | Discharge: 2021-09-18 | Disposition: A | Discharge status: Home / Self Care | Payer: BC Managed Care – PPO | Attending: Neurological Surgery | Admitting: Neurological Surgery

## 2021-09-18 ENCOUNTER — Ambulatory Visit (HOSPITAL_COMMUNITY)
Admission: RE | Disposition: A | Discharge status: Home / Self Care | Payer: Self-pay | Source: Home / Self Care | Attending: Neurological Surgery

## 2021-09-18 ENCOUNTER — Ambulatory Visit (HOSPITAL_COMMUNITY): Payer: BC Managed Care – PPO | Admitting: Anesthesiology

## 2021-09-18 ENCOUNTER — Ambulatory Visit (HOSPITAL_COMMUNITY): Payer: BC Managed Care – PPO | Admitting: Vascular Surgery

## 2021-09-18 ENCOUNTER — Ambulatory Visit (HOSPITAL_COMMUNITY): Payer: BC Managed Care – PPO

## 2021-09-18 ENCOUNTER — Encounter (HOSPITAL_COMMUNITY): Payer: Self-pay | Admitting: Neurological Surgery

## 2021-09-18 ENCOUNTER — Other Ambulatory Visit: Payer: Self-pay

## 2021-09-18 DIAGNOSIS — Z7982 Long term (current) use of aspirin: Secondary | ICD-10-CM | POA: Diagnosis not present

## 2021-09-18 DIAGNOSIS — M5416 Radiculopathy, lumbar region: Secondary | ICD-10-CM | POA: Diagnosis present

## 2021-09-18 DIAGNOSIS — Z79899 Other long term (current) drug therapy: Secondary | ICD-10-CM | POA: Insufficient documentation

## 2021-09-18 DIAGNOSIS — I2510 Atherosclerotic heart disease of native coronary artery without angina pectoris: Secondary | ICD-10-CM | POA: Diagnosis not present

## 2021-09-18 DIAGNOSIS — F17210 Nicotine dependence, cigarettes, uncomplicated: Secondary | ICD-10-CM | POA: Diagnosis not present

## 2021-09-18 DIAGNOSIS — M48062 Spinal stenosis, lumbar region with neurogenic claudication: Secondary | ICD-10-CM | POA: Diagnosis not present

## 2021-09-18 DIAGNOSIS — E109 Type 1 diabetes mellitus without complications: Secondary | ICD-10-CM | POA: Insufficient documentation

## 2021-09-18 DIAGNOSIS — Z9889 Other specified postprocedural states: Secondary | ICD-10-CM

## 2021-09-18 DIAGNOSIS — Z01818 Encounter for other preprocedural examination: Secondary | ICD-10-CM

## 2021-09-18 DIAGNOSIS — Z794 Long term (current) use of insulin: Secondary | ICD-10-CM | POA: Insufficient documentation

## 2021-09-18 HISTORY — DX: Other specified postprocedural states: Z98.890

## 2021-09-18 HISTORY — PX: LUMBAR LAMINECTOMY/DECOMPRESSION MICRODISCECTOMY: SHX5026

## 2021-09-18 LAB — GLUCOSE, CAPILLARY
Glucose-Capillary: 125 mg/dL — ABNORMAL HIGH (ref 70–99)
Glucose-Capillary: 149 mg/dL — ABNORMAL HIGH (ref 70–99)

## 2021-09-18 SURGERY — LUMBAR LAMINECTOMY/DECOMPRESSION MICRODISCECTOMY 1 LEVEL
Anesthesia: General | Site: Back | Laterality: Right

## 2021-09-18 MED ORDER — CHLORHEXIDINE GLUCONATE CLOTH 2 % EX PADS: 6.0000 | MEDICATED_PAD | Freq: Once | CUTANEOUS | Status: DC

## 2021-09-18 MED ORDER — LACTATED RINGERS IV SOLN: INTRAVENOUS | Status: DC

## 2021-09-18 MED ORDER — LINACLOTIDE 145 MCG PO CAPS
290.0000 ug | ORAL_CAPSULE | Freq: Every day | ORAL | Status: DC
  Filled 2021-09-18: qty 2

## 2021-09-18 MED ORDER — METHOCARBAMOL 750 MG PO TABS: 750.0000 mg | ORAL_TABLET | Freq: Four times a day (QID) | ORAL | 0 refills | Status: DC

## 2021-09-18 MED ORDER — FENTANYL CITRATE (PF) 250 MCG/5ML IJ SOLN
INTRAMUSCULAR | Status: DC | PRN
Start: 2021-09-18 — End: 2021-09-18
  Administered 2021-09-18: 150 ug via INTRAVENOUS

## 2021-09-18 MED ORDER — ONDANSETRON HCL 4 MG/2ML IJ SOLN: 4.0000 mg | Freq: Four times a day (QID) | INTRAMUSCULAR | Status: DC | PRN

## 2021-09-18 MED ORDER — CEFAZOLIN SODIUM-DEXTROSE 2-4 GM/100ML-% IV SOLN
2.0000 g | INTRAVENOUS | Status: AC
  Administered 2021-09-18: 2 g via INTRAVENOUS
  Filled 2021-09-18: qty 100

## 2021-09-18 MED ORDER — METHOCARBAMOL 500 MG PO TABS
500.0000 mg | ORAL_TABLET | Freq: Four times a day (QID) | ORAL | Status: DC | PRN
  Administered 2021-09-18: 500 mg via ORAL
  Filled 2021-09-18: qty 1

## 2021-09-18 MED ORDER — THROMBIN 5000 UNITS EX SOLR
CUTANEOUS | Status: AC
  Filled 2021-09-18: qty 15000

## 2021-09-18 MED ORDER — ACETAMINOPHEN 500 MG PO TABS
1000.0000 mg | ORAL_TABLET | ORAL | Status: AC
  Administered 2021-09-18: 1000 mg via ORAL
  Filled 2021-09-18: qty 2

## 2021-09-18 MED ORDER — THROMBIN 5000 UNITS EX SOLR
OROMUCOSAL | Status: DC | PRN
  Administered 2021-09-18: 5 mL via TOPICAL

## 2021-09-18 MED ORDER — MENTHOL 3 MG MT LOZG: 1.0000 | LOZENGE | OROMUCOSAL | Status: DC | PRN

## 2021-09-18 MED ORDER — INSULIN ASPART 100 UNIT/ML IJ SOLN: 0.0000 [IU] | Freq: Three times a day (TID) | INTRAMUSCULAR | Status: DC

## 2021-09-18 MED ORDER — MIDAZOLAM HCL 2 MG/2ML IJ SOLN
INTRAMUSCULAR | Status: AC
  Filled 2021-09-18: qty 2

## 2021-09-18 MED ORDER — OXYCODONE HCL 5 MG PO TABS
5.0000 mg | ORAL_TABLET | ORAL | Status: DC | PRN
  Administered 2021-09-18: 5 mg via ORAL
  Filled 2021-09-18: qty 1

## 2021-09-18 MED ORDER — CHLORHEXIDINE GLUCONATE 0.12 % MT SOLN
15.0000 mL | Freq: Once | OROMUCOSAL | Status: AC
  Administered 2021-09-18: 15 mL via OROMUCOSAL
  Filled 2021-09-18: qty 15

## 2021-09-18 MED ORDER — HYDROMORPHONE HCL 1 MG/ML IJ SOLN
INTRAMUSCULAR | Status: AC
  Filled 2021-09-18: qty 1

## 2021-09-18 MED ORDER — PHENOL 1.4 % MT LIQD: 1.0000 | OROMUCOSAL | Status: DC | PRN

## 2021-09-18 MED ORDER — SUGAMMADEX SODIUM 200 MG/2ML IV SOLN
INTRAVENOUS | Status: DC | PRN
  Administered 2021-09-18: 400 mg via INTRAVENOUS

## 2021-09-18 MED ORDER — ROCURONIUM BROMIDE 10 MG/ML (PF) SYRINGE
PREFILLED_SYRINGE | INTRAVENOUS | Status: DC | PRN
  Administered 2021-09-18: 60 mg via INTRAVENOUS

## 2021-09-18 MED ORDER — FOLIC ACID 1 MG PO TABS: 1.0000 mg | ORAL_TABLET | Freq: Every day | ORAL | Status: DC

## 2021-09-18 MED ORDER — CEFAZOLIN SODIUM-DEXTROSE 2-4 GM/100ML-% IV SOLN: 2.0000 g | Freq: Three times a day (TID) | INTRAVENOUS | Status: DC

## 2021-09-18 MED ORDER — ONDANSETRON HCL 4 MG/2ML IJ SOLN
INTRAMUSCULAR | Status: DC | PRN
  Administered 2021-09-18: 4 mg via INTRAVENOUS

## 2021-09-18 MED ORDER — DEXAMETHASONE SODIUM PHOSPHATE 10 MG/ML IJ SOLN
INTRAMUSCULAR | Status: AC
  Filled 2021-09-18: qty 1

## 2021-09-18 MED ORDER — METOPROLOL TARTRATE 25 MG PO TABS: 25.0000 mg | ORAL_TABLET | Freq: Two times a day (BID) | ORAL | Status: DC

## 2021-09-18 MED ORDER — BUPIVACAINE HCL (PF) 0.25 % IJ SOLN
INTRAMUSCULAR | Status: AC
  Filled 2021-09-18: qty 30

## 2021-09-18 MED ORDER — ONDANSETRON HCL 4 MG PO TABS: 4.0000 mg | ORAL_TABLET | Freq: Four times a day (QID) | ORAL | Status: DC | PRN

## 2021-09-18 MED ORDER — BUPIVACAINE HCL (PF) 0.25 % IJ SOLN
INTRAMUSCULAR | Status: DC | PRN
  Administered 2021-09-18: 5 mL
  Administered 2021-09-18: 10 mL

## 2021-09-18 MED ORDER — MIDAZOLAM HCL 2 MG/2ML IJ SOLN: 0.5000 mg | Freq: Once | INTRAMUSCULAR | Status: DC | PRN

## 2021-09-18 MED ORDER — MIDAZOLAM HCL 2 MG/2ML IJ SOLN
INTRAMUSCULAR | Status: DC | PRN
  Administered 2021-09-18: 2 mg via INTRAVENOUS

## 2021-09-18 MED ORDER — PROPOFOL 10 MG/ML IV BOLUS
INTRAVENOUS | Status: DC | PRN
  Administered 2021-09-18: 100 mg via INTRAVENOUS

## 2021-09-18 MED ORDER — 0.9 % SODIUM CHLORIDE (POUR BTL) OPTIME
TOPICAL | Status: DC | PRN
  Administered 2021-09-18: 1000 mL

## 2021-09-18 MED ORDER — DEXAMETHASONE SODIUM PHOSPHATE 10 MG/ML IJ SOLN
INTRAMUSCULAR | Status: DC | PRN
  Administered 2021-09-18: 10 mg via INTRAVENOUS

## 2021-09-18 MED ORDER — PHENYLEPHRINE 80 MCG/ML (10ML) SYRINGE FOR IV PUSH (FOR BLOOD PRESSURE SUPPORT)
PREFILLED_SYRINGE | INTRAVENOUS | Status: AC
  Filled 2021-09-18: qty 10

## 2021-09-18 MED ORDER — POTASSIUM CHLORIDE IN NACL 20-0.9 MEQ/L-% IV SOLN: INTRAVENOUS | Status: DC

## 2021-09-18 MED ORDER — FLUOXETINE HCL 20 MG PO CAPS: 80.0000 mg | ORAL_CAPSULE | Freq: Every evening | ORAL | Status: DC

## 2021-09-18 MED ORDER — HYDROMORPHONE HCL 1 MG/ML IJ SOLN
0.2500 mg | INTRAMUSCULAR | Status: DC | PRN
  Administered 2021-09-18 (×3): 0.25 mg via INTRAVENOUS

## 2021-09-18 MED ORDER — ORAL CARE MOUTH RINSE: 15.0000 mL | Freq: Once | OROMUCOSAL | Status: AC

## 2021-09-18 MED ORDER — SODIUM CHLORIDE 0.9 % IV SOLN: 250.0000 mL | INTRAVENOUS | Status: DC

## 2021-09-18 MED ORDER — ROCURONIUM BROMIDE 10 MG/ML (PF) SYRINGE
PREFILLED_SYRINGE | INTRAVENOUS | Status: AC
  Filled 2021-09-18: qty 20

## 2021-09-18 MED ORDER — GABAPENTIN 300 MG PO CAPS
300.0000 mg | ORAL_CAPSULE | ORAL | Status: AC
  Administered 2021-09-18: 300 mg via ORAL
  Filled 2021-09-18: qty 1

## 2021-09-18 MED ORDER — INSULIN ASPART 100 UNIT/ML IJ SOLN: 0.0000 [IU] | INTRAMUSCULAR | Status: DC | PRN

## 2021-09-18 MED ORDER — MORPHINE SULFATE (PF) 2 MG/ML IV SOLN: 2.0000 mg | INTRAVENOUS | Status: DC | PRN

## 2021-09-18 MED ORDER — LIDOCAINE 2% (20 MG/ML) 5 ML SYRINGE
INTRAMUSCULAR | Status: AC
  Filled 2021-09-18: qty 5

## 2021-09-18 MED ORDER — SENNA 8.6 MG PO TABS: 1.0000 | ORAL_TABLET | Freq: Two times a day (BID) | ORAL | Status: DC

## 2021-09-18 MED ORDER — THROMBIN (RECOMBINANT) 5000 UNITS EX SOLR
CUTANEOUS | Status: DC | PRN
  Administered 2021-09-18: 10 mL via TOPICAL

## 2021-09-18 MED ORDER — OXYCODONE HCL 5 MG PO TABS: 5.0000 mg | ORAL_TABLET | Freq: Once | ORAL | Status: DC | PRN

## 2021-09-18 MED ORDER — ONDANSETRON HCL 4 MG/2ML IJ SOLN
INTRAMUSCULAR | Status: AC
  Filled 2021-09-18: qty 2

## 2021-09-18 MED ORDER — PHENYLEPHRINE HCL-NACL 20-0.9 MG/250ML-% IV SOLN
INTRAVENOUS | Status: DC | PRN
  Administered 2021-09-18: 10 ug/min via INTRAVENOUS

## 2021-09-18 MED ORDER — MEPERIDINE HCL 25 MG/ML IJ SOLN: 6.2500 mg | INTRAMUSCULAR | Status: DC | PRN

## 2021-09-18 MED ORDER — METHOCARBAMOL 1000 MG/10ML IJ SOLN: 500.0000 mg | Freq: Four times a day (QID) | INTRAVENOUS | Status: DC | PRN

## 2021-09-18 MED ORDER — OXYCODONE-ACETAMINOPHEN 5-325 MG PO TABS: 1.0000 | ORAL_TABLET | ORAL | 0 refills | Status: DC | PRN

## 2021-09-18 MED ORDER — ACETAMINOPHEN 500 MG PO TABS: 1000.0000 mg | ORAL_TABLET | Freq: Four times a day (QID) | ORAL | Status: DC

## 2021-09-18 MED ORDER — FENTANYL CITRATE (PF) 250 MCG/5ML IJ SOLN
INTRAMUSCULAR | Status: AC
  Filled 2021-09-18: qty 5

## 2021-09-18 MED ORDER — LIDOCAINE 2% (20 MG/ML) 5 ML SYRINGE
INTRAMUSCULAR | Status: DC | PRN
  Administered 2021-09-18: 40 mg via INTRAVENOUS

## 2021-09-18 MED ORDER — OXYCODONE HCL 5 MG/5ML PO SOLN: 5.0000 mg | Freq: Once | ORAL | Status: DC | PRN

## 2021-09-18 MED ORDER — SODIUM CHLORIDE 0.9% FLUSH: 3.0000 mL | INTRAVENOUS | Status: DC | PRN

## 2021-09-18 MED ORDER — SODIUM CHLORIDE 0.9% FLUSH: 3.0000 mL | Freq: Two times a day (BID) | INTRAVENOUS | Status: DC

## 2021-09-18 MED ORDER — PANTOPRAZOLE SODIUM 40 MG PO TBEC: 40.0000 mg | DELAYED_RELEASE_TABLET | Freq: Every day | ORAL | Status: DC

## 2021-09-18 SURGICAL SUPPLY — 47 items
ADH SKN CLS LQ APL DERMABOND (GAUZE/BANDAGES/DRESSINGS) ×1
APL SKNCLS STERI-STRIP NONHPOA (GAUZE/BANDAGES/DRESSINGS) ×1
BAG COUNTER SPONGE SURGICOUNT (BAG) ×4 IMPLANT
BAG SPNG CNTER NS LX DISP (BAG) ×2
BAND INSRT 18 STRL LF DISP RB (MISCELLANEOUS)
BAND RUBBER #18 3X1/16 STRL (MISCELLANEOUS) ×4 IMPLANT
BENZOIN TINCTURE PRP APPL 2/3 (GAUZE/BANDAGES/DRESSINGS) ×3 IMPLANT
BUR CARBIDE MATCH 3.0 (BURR) ×3 IMPLANT
CANISTER SUCT 3000ML PPV (MISCELLANEOUS) ×3 IMPLANT
DERMABOND ADHESIVE PROPEN (GAUZE/BANDAGES/DRESSINGS) ×1
DERMABOND ADVANCED .7 DNX6 (GAUZE/BANDAGES/DRESSINGS) IMPLANT
DRAPE LAPAROTOMY 100X72X124 (DRAPES) ×3 IMPLANT
DRAPE MICROSCOPE LEICA (MISCELLANEOUS) ×2 IMPLANT
DRAPE SURG 17X23 STRL (DRAPES) ×3 IMPLANT
DRSG OPSITE POSTOP 3X4 (GAUZE/BANDAGES/DRESSINGS) ×1 IMPLANT
DURAPREP 26ML APPLICATOR (WOUND CARE) ×3 IMPLANT
ELECT REM PT RETURN 9FT ADLT (ELECTROSURGICAL) ×2
ELECTRODE REM PT RTRN 9FT ADLT (ELECTROSURGICAL) ×2 IMPLANT
GAUZE 4X4 16PLY ~~LOC~~+RFID DBL (SPONGE) IMPLANT
GLOVE BIO SURGEON STRL SZ7 (GLOVE) IMPLANT
GLOVE BIO SURGEON STRL SZ8 (GLOVE) ×3 IMPLANT
GLOVE BIOGEL PI IND STRL 7.0 (GLOVE) IMPLANT
GLOVE BIOGEL PI INDICATOR 7.0 (GLOVE)
GOWN STRL REUS W/ TWL LRG LVL3 (GOWN DISPOSABLE) IMPLANT
GOWN STRL REUS W/ TWL XL LVL3 (GOWN DISPOSABLE) ×2 IMPLANT
GOWN STRL REUS W/TWL 2XL LVL3 (GOWN DISPOSABLE) IMPLANT
GOWN STRL REUS W/TWL LRG LVL3 (GOWN DISPOSABLE)
GOWN STRL REUS W/TWL XL LVL3 (GOWN DISPOSABLE) ×2
HEMOSTAT POWDER KIT SURGIFOAM (HEMOSTASIS) ×3 IMPLANT
KIT BASIN OR (CUSTOM PROCEDURE TRAY) ×3 IMPLANT
KIT TURNOVER KIT B (KITS) ×3 IMPLANT
NDL HYPO 25X1 1.5 SAFETY (NEEDLE) ×2 IMPLANT
NDL SPNL 20GX3.5 QUINCKE YW (NEEDLE) IMPLANT
NEEDLE HYPO 25X1 1.5 SAFETY (NEEDLE) ×2 IMPLANT
NEEDLE SPNL 20GX3.5 QUINCKE YW (NEEDLE) IMPLANT
NS IRRIG 1000ML POUR BTL (IV SOLUTION) ×3 IMPLANT
PACK LAMINECTOMY NEURO (CUSTOM PROCEDURE TRAY) ×3 IMPLANT
PAD ARMBOARD 7.5X6 YLW CONV (MISCELLANEOUS) ×9 IMPLANT
SPONGE SURGIFOAM ABS GEL SZ50 (HEMOSTASIS) ×1 IMPLANT
STRIP CLOSURE SKIN 1/2X4 (GAUZE/BANDAGES/DRESSINGS) ×3 IMPLANT
SUT VIC AB 0 CT1 18XCR BRD8 (SUTURE) ×2 IMPLANT
SUT VIC AB 0 CT1 8-18 (SUTURE) ×2
SUT VIC AB 2-0 CP2 18 (SUTURE) ×3 IMPLANT
SUT VIC AB 3-0 SH 8-18 (SUTURE) ×3 IMPLANT
TOWEL GREEN STERILE (TOWEL DISPOSABLE) ×3 IMPLANT
TOWEL GREEN STERILE FF (TOWEL DISPOSABLE) ×3 IMPLANT
WATER STERILE IRR 1000ML POUR (IV SOLUTION) ×3 IMPLANT

## 2021-09-18 NOTE — Anesthesia Postprocedure Evaluation (Signed)
Anesthesia Post Note  Patient: [name]  Procedure(s) Performed: Laminectomy and Foraminotomy - Lumbar Four-Lumbar Five - right (Right: Back)     Patient location during evaluation: PACU Anesthesia Type: General Level of consciousness: awake and alert, patient cooperative and oriented Pain management: pain level controlled Vital Signs Assessment: post-procedure vital signs reviewed and stable Respiratory status: spontaneous breathing, nonlabored ventilation and respiratory function stable Cardiovascular status: blood pressure returned to baseline and stable Postop Assessment: no apparent nausea or vomiting Anesthetic complications: no   No notable events documented.  Last Vitals:  Vitals:   09/18/21 1410 09/18/21 1434  BP: 111/71 128/76  Pulse: (!) 59 (!) 59  Resp: 13 18  Temp: 36.4 C 36.7 C  SpO2: 100% 96%    Last Pain:  Vitals:   09/18/21 1340  TempSrc:   PainSc: 6                  Karmah Potocki,E. Samarion Ehle

## 2021-09-18 NOTE — Transfer of Care (Signed)
Immediate Anesthesia Transfer of Care Note  Patient: [name]  Procedure(s) Performed: Laminectomy and Foraminotomy - Lumbar Four-Lumbar Five - right (Right: Back)  Patient Location: PACU  Anesthesia Type:General  Level of Consciousness: drowsy  Airway & Oxygen Therapy: Patient Spontanous Breathing and Patient connected to face mask oxygen  Post-op Assessment: Report given to RN and Post -op Vital signs reviewed and stable  Post vital signs: Reviewed and stable  Last Vitals:  Vitals Value Taken Time  BP 147/80 09/18/21 1308  Temp    Pulse 61 09/18/21 1309  Resp 20 09/18/21 1309  SpO2 95 % 09/18/21 1309  Vitals shown include unvalidated device data.  Last Pain:  Vitals:   09/18/21 0956  TempSrc:   PainSc: 7       Patients Stated Pain Goal: 2 (69/48/54 6270)  Complications: No notable events documented.

## 2021-09-18 NOTE — Plan of Care (Signed)
Pt doing well. Pt and family given D/C instructions with verbal understanding. Rx's were sent to the pharmacy by MD. Pt's incision is clean and dry with no sign of infection. Pt's IV was removed prior to D/C. Pt D/C'd home via wheelchair per MD order. Pt is stable @ D/C and has no other needs at this time. Rishav Rockefeller, RN  

## 2021-09-18 NOTE — H&P (Signed)
Subjective: Patient is a 61 y.o. female admitted for right leg pain. Onset of symptoms was several months ago, gradually worsening since that time.  The pain is rated severe, and is located at the across the lower back and radiates to right leg in an L5 distribution. The pain is described as aching and occurs all day. The symptoms have been progressive. Symptoms are exacerbated by exercise, standing, and walking for more than a few minutes. MRI or CT showed lumbar spinal stenosis L4-5 above previous L5-S1 fusion  Past Medical History:  Diagnosis Date   Abnormal thyroid function test 12/06/2015   Acute bilateral thoracic back pain 12/03/2018   Acute left ankle pain 03/09/2020   Alcohol use 04/15/2019   Formatting of this note might be different from the original. Recommend abstinence from alcohol   Anxiety disorder 06/05/2015   Last Assessment & Plan:  Formatting of this note might be different from the original. Had started her on some xanax about week ago and she feels it has helped her and she is taking about half tablet for this and will continue with it as she has been compliant with it   Arteriosclerotic vascular disease 11/06/2017   Arthritis    back    Atherosclerosis 06/05/2015   Formatting of this note might be different from the original. Seen on CTs of chest.   Atherosclerosis of native coronary artery of native heart without angina pectoris 02/11/2016   Attention deficit 12/04/2017   Blurred vision 07/11/2020   BMI 27.0-27.9,adult 07/11/2015   Last Assessment & Plan:  Formatting of this note might be different from the original. Relevant Hx: Course: Daily Update: Today's Plan:difficult for her with the insulin as well as her abilify and the weight is increaseing for her rather than decreasing, she has cut her alcohol intake back, she has worked on her diet more with decreasing her carb intake as well and still difficulty discussed with    Bursitis of both hips 04/14/2018   CAD  (coronary artery disease) 12/24/2017   Chest pain 11/06/2017   Chronic bronchitis ([place]) 12/04/2015   Formatting of this note might be different from the original. PFT ratio 88% FEV1 103% FVC 94% DLCO 76%  Last Assessment & Plan:  Formatting of this note might be different from the original. We discussed this as she has been coughing again for about 2 mnths and she sounds congested on exam with previous ease for pneumonia will place her on levaquin and have reviewed with her the importance of her    Cigarette smoker 02/26/2016   Coronary arteriosclerosis 12/24/2017   Coronary artery disease of native artery of native heart with stable angina pectoris ([place]) 01/25/2016   Formatting of this note might be different from the original. 2017:  50% LAD lesion.  Calcium noted other places.  Heart cath December 31, 2017 with a 30% ostial LAD lesion otherwise normal coronary arteries seen with LVEF 55 to 65%   Deficiency of other specified B group vitamins 11/07/2016   Degeneration of lumbar intervertebral disc 11/06/2017   Diabetes mellitus due to underlying condition with unspecified complications ([place]) 24/12/7351   Diabetes mellitus type 1, uncomplicated ([place]) 29/92/4268   Added automatically from request for surgery [id number]  Formatting of this note might be different from the original. Added automatically from request for surgery [id number]   Diabetes mellitus without complication ([place])    Type 1- injections only- recent change to [place] with improved A1C reading.   Dysphagia 11/04/2017  Dyspnea on exertion 12/04/2017   Elevated alkaline phosphatase level 11/09/2020   Elevated LFTs 03/10/2019   Formatting of this note might be different from the original. History.  Cannot change to this.   Erythrocytosis 12/04/2017   Facial tingling 07/11/2020   Fatigue 11/06/2017   Fever 56/25/6389   Folic acid deficiency 37/34/2876   Fusion of lumbar spine 01/11/2020   HAP (hospital-acquired pneumonia) 01/13/2020    Hemangioma 11/06/2017   History of metabolic disorder 81/15/7262   History of pneumonia 01/25/2020   Formatting of this note might be different from the original. Recent mutifocal pneumonia September 2021 Formatting of this note might be different from the original. Formatting of this note might be different from the original. Recent mutifocal pneumonia September 2021   History of sepsis 01/25/2020   Formatting of this note might be different from the original. Recent septic syndrome with multifocal pneumonia Formatting of this note might be different from the original. Formatting of this note might be different from the original. Recent septic syndrome with multifocal pneumonia   HNP (herniated nucleus pulposus), lumbar 05/10/2014   Hypotension 07/18/2020   Idiopathic peripheral neuropathy 08/29/2020   Insulin long-term use ([place]) 11/06/2017   Intermittent claudication ([place]) 12/04/2017   Irritable bowel syndrome 09/03/2020   Left foot pain 01/28/2018   Lipoma of abdominal wall 08/08/2020   Lipoma of left upper extremity 09/04/2020   Low back pain 10/22/2019   Lumbar post-laminectomy syndrome 03/04/2019   Lumbar radiculopathy 05/26/2017   Lung mass 12/04/2017   Malaise and fatigue 11/06/2017   Menopausal disorder 11/06/2017   Mixed hyperlipidemia 04/09/2016   Formatting of this note might be different from the original. Added automatically from request for surgery [id number]   Multiple actinic keratoses 11/06/2017   Myositis 12/04/2017   Nicotine dependence, cigarettes, uncomplicated 41/63/8453   Obsessive-compulsive disorder 10/14/2019   Formatting of this note might be different from the original. Formerly followed with [facility].   Overweight with body mass index (BMI) 25.0-29.9 07/11/2015   Last Assessment & Plan:  Relevant Hx: Course: Daily Update: Today's Plan:difficult for her with the insulin as well as her abilify and the weight is increaseing for her rather than decreasing, she has  cut her alcohol intake back, she has worked on her diet more with decreasing her carb intake as well and still difficulty discussed with the saxenda and will try to get this approved for her and see    Pain in both lower extremities 07/10/2015   Last Assessment & Plan:  Formatting of this note might be different from the original. Relevant Hx: Course: Daily Update: Today's Plan:she had pain to her right anterior thigh there is edema of both legs and she has fear for DVT which she is going to have [place] of and confirm ok, she has increased her weight which is part of her edema I believe as well as her insulin usage. She is advised as well tha   Pain of left hip joint 07/23/2018   Pelvic pain 12/14/2020   Personal history of (healed) traumatic fracture 12/31/2016   Pneumonia    Polyarthralgia 06/11/2018   Primary insomnia 06/05/2015   Primary osteoarthritis involving multiple joints 08/24/2019   Pure hypercholesterolemia 02/11/2016   Purpura ([place]) 11/06/2017   Respiratory failure ([place]) 11/2018   RUQ pain 11/09/2020   Sciatica 11/03/2017   Scoliosis deformity of spine 11/03/2017   Secondary diabetes mellitus ([place]) 12/24/2017   Senile purpura ([place]) 12/04/2017   Sepsis ([place]) 07/18/2020  Smoking greater than 30 pack years 02/26/2016   Tobacco abuse 02/11/2016   Tobacco user 02/11/2016   Trichotillomania    hx.    Trigger thumb of right hand 12/45/8099   Uncomplicated type 1 diabetes mellitus ([place]) 10/22/2020    Past Surgical History:  Procedure Laterality Date   ABDOMINAL EXPOSURE N/A 01/11/2020   Procedure: ABDOMINAL EXPOSURE;  Surgeon: Marty Heck, MD;  [facility];  Service: Vascular;  Laterality: N/A;   ANTERIOR LUMBAR FUSION N/A 01/11/2020   Procedure: ANTERIOR LUMBAR INTERBODY FUSION  LUMBAR FIVE-SACRAL ONE, LEFT LUMBAR TWO-THREE FORAMINOTOMY, DISECTOMY;  Surgeon: Melina Schools, MD;  Location: [place];  Service: Orthopedics;  Laterality: N/A;   APPENDECTOMY  04/28/1970    BACK SURGERY  2016   CARDIAC CATHETERIZATION  12/31/2017   COLONOSCOPY  04/28/2017   EXCISION/RELEASE BURSA HIP Left 04/14/2018   Procedure: Excision trochanteric bursa left hip;  Surgeon: Susa Day, MD;  Location: [facility]S;  Service: Orthopedics;  Laterality: Left;  60 mins   HAND SURGERY     HIP OPEN REDUCTION Right    ORIF   INTRAVASCULAR PRESSURE WIRE/FFR STUDY N/A 05/06/2021   Procedure: INTRAVASCULAR PRESSURE WIRE/FFR STUDY;  Surgeon: Nelva Bush, MD;  Location: [place] [facility];  Service: Cardiovascular;  Laterality: N/A;   KNEE ARTHROSCOPY     LEFT HEART CATH AND CORONARY ANGIOGRAPHY N/A 12/31/2017   Procedure: LEFT HEART CATH AND CORONARY ANGIOGRAPHY;  Surgeon: Martinique, Peter M, MD;  Location: [place] [facility];  Service: Cardiovascular;  Laterality: N/A;   LEFT HEART CATH AND CORONARY ANGIOGRAPHY N/A 05/06/2021   Procedure: LEFT HEART CATH AND CORONARY ANGIOGRAPHY;  Surgeon: Nelva Bush, MD;  Location: [place];  Service: Cardiovascular;  Laterality: N/A;   LUMBAR LAMINECTOMY/DECOMPRESSION MICRODISCECTOMY Left 05/10/2014   Procedure: MICRODISCECTOMY LUMBAR DECOMPRESSION L5-S1 LEFT ;  Surgeon: Johnn Hai, MD;  Location: [facility];  Service: Orthopedics;  Laterality: Left;   LUMBAR LAMINECTOMY/DECOMPRESSION MICRODISCECTOMY N/A 01/11/2020   Procedure: LUMBAR LAMINECTOMY/DECOMPRESSION MICRODISCECTOMY LEFT LUMBAR TWO-THREE;  Surgeon: Melina Schools, MD;  Location: [facility];  Service: Orthopedics;  Laterality: N/A;   ORIF WRIST FRACTURE Right    retained hardware   ORTHOPAEDIC SURGERY  04/14/2018   TUBAL LIGATION  12/25/1995    Prior to Admission medications   Medication Sig Start Date End Date Taking? Authorizing Provider  acetaminophen (TYLENOL) 500 MG tablet Take 500 mg by mouth daily as needed for mild pain.   Yes [provider]  ALPRAZolam Duanne Moron) 0.5 MG tablet Take 0.5 mg by mouth at bedtime.  08/08/19  Yes [provider]  atorvastatin  (LIPITOR) 80 MG tablet Take 80 mg by mouth every evening. 05/31/20  Yes [provider]  cyanocobalamin (,VITAMIN B-12,) 1000 MCG/ML injection Inject 1,000 mcg into the muscle every 14 (fourteen) days.   Yes [provider]  FLUoxetine (PROZAC) 40 MG capsule Take 80 mg by mouth every evening. 07/11/19  Yes [provider]  folic acid (FOLVITE) 1 MG tablet Take 1 mg by mouth at bedtime.    Yes [provider]  furosemide (LASIX) 40 MG tablet Take 20-40 mg by mouth daily as needed for fluid.   Yes [provider]  insulin aspart (NOVOLOG FLEXPEN) 100 UNIT/ML FlexPen Inject 0-10 Units into the skin 3 (three) times daily with meals. Per sliding scale 07/26/18  Yes [provider]  insulin degludec (TRESIBA FLEXTOUCH) 200 UNIT/ML FlexTouch Pen Inject 32 Units into the skin in the morning. 07/19/18  Yes [provider]  linaclotide (LINZESS) 290 MCG CAPS capsule Take 290 mcg by mouth at bedtime.  07/03/17  Yes [provider]  metoprolol tartrate (LOPRESSOR) 25 MG tablet Take 1 tablet (25 mg total) by mouth 2 (two) times daily. 05/02/21 11/02/21 Yes Park Liter, MD  pantoprazole (PROTONIX) 40 MG tablet Take 40 mg by mouth at bedtime. 01/19/21  Yes [provider]  Semaglutide,0.25 or 0.'5MG'$ /DOS, (OZEMPIC, 0.25 OR 0.5 MG/DOSE,) 2 MG/3ML SOPN Inject 0.5 mg into the skin every Wednesday.   Yes [provider]  traMADol (ULTRAM) 50 MG tablet Take 50 mg by mouth daily as needed for moderate pain.   Yes [provider]  aspirin EC 81 MG tablet Take 1 tablet (81 mg total) by mouth daily. Swallow whole. Patient not taking: Reported on 09/11/2021 05/02/21   Park Liter, MD  [facility]) 150 MG 12 hr tablet Take 1 tablet (150 mg total) by mouth 2 (two) times daily. Patient not taking: Reported on 09/11/2021 06/04/21   Spero Geralds, MD  finasteride (PROSCAR) 5 MG tablet Take 2.5 mg by mouth every  evening. Patient not taking: Reported on 09/11/2021 08/21/20   [provider]  nitroGLYCERIN (NITROSTAT) 0.4 MG SL tablet Place 1 tablet (0.4 mg total) under the tongue every 5 (five) minutes as needed for chest pain. 05/02/21   Park Liter, MD   Allergies  Allergen Reactions   Celebrex [Celecoxib] Swelling    Face swelling   Mobic [Meloxicam] Swelling    Face swelling    Social History   Tobacco Use   Smoking status: Every Day    Packs/day: 0.50    Years: 42.00    Pack years: 21.00    Types: Cigarettes    Passive exposure: Never   Smokeless tobacco: Never  Substance Use Topics   Alcohol use: Yes    Alcohol/week: 6.0 standard drinks    Types: 6 Cans of beer per week    Comment: weekends - social    Family History  Problem Relation Age of Onset   Diabetes Mother    Cancer Mother    Hypertension Father    Heart disease Father    Diabetes Father    Healthy Brother    Healthy Son    Diabetes Son    Healthy Daughter      Review of Systems  Positive ROS: Negative  All other systems have been reviewed and were otherwise negative with the exception of those mentioned in the HPI and as above.  Objective: Vital signs in last 24 hours: Temp:  [97.9 F (36.6 C)] 97.9 F (36.6 C) (05/24 0911) Pulse Rate:  [67] 67 (05/24 0911) Resp:  [17] 17 (05/24 0911) BP: (110)/(58) 110/58 (05/24 0911) SpO2:  [96 %] 96 % (05/24 0911) Weight:  [66.7 kg] 66.7 kg (05/24 0911)  General Appearance: Alert, cooperative, no distress, appears stated age Head: Normocephalic, without obvious abnormality, atraumatic Eyes: PERRL, conjunctiva/corneas clear, EOM's intact    Neck: Supple, symmetrical, trachea midline Back: Symmetric, no curvature, ROM normal, no CVA tenderness Lungs:  respirations unlabored Heart: Regular rate and rhythm Abdomen: Soft, non-tender Extremities: Extremities normal, atraumatic, no cyanosis or edema Pulses: 2+ and symmetric all extremities Skin: Skin  color, texture, turgor normal, no rashes or lesions  NEUROLOGIC:   Mental status: Alert and oriented x4,  no aphasia, good attention span, fund of knowledge, and memory Motor Exam - grossly normal Sensory Exam - grossly normal Reflexes:  1+ Coordination - grossly normal Gait - grossly normal Balance - grossly normal Cranial Nerves: I: smell Not tested  II: visual acuity  OS: nl    OD: nl  II: visual fields Full to confrontation  II: pupils Equal, round, reactive to light  III,VII: ptosis None  III,IV,VI: extraocular muscles  Full ROM  V: mastication Normal  V: facial light touch sensation  Normal  V,VII: corneal reflex  Present  VII: facial muscle function - upper  Normal  VII: facial muscle function - lower Normal  VIII: hearing Not tested  IX: soft palate elevation  Normal  IX,X: gag reflex Present  XI: trapezius strength  5/5  XI: sternocleidomastoid strength 5/5  XI: neck flexion strength  5/5  XII: tongue strength  Normal    Data Review Lab Results  Component Value Date   WBC 7.0 09/11/2021   HGB 14.0 09/11/2021   HCT 42.2 09/11/2021   MCV 98.4 09/11/2021   PLT 219 09/11/2021   Lab Results  Component Value Date   NA 138 09/11/2021   K 4.2 09/11/2021   CL 106 09/11/2021   CO2 27 09/11/2021   BUN 9 09/11/2021   CREATININE 0.64 09/11/2021   GLUCOSE 150 (H) 09/11/2021   Lab Results  Component Value Date   INR 1.1 09/11/2021    Assessment/Plan:  Estimated body mass index is 26.89 kg/m as calculated from the following:   Height as of this encounter: '5\' 2"'$  (1.575 m).   Weight as of this encounter: 66.7 kg. Patient admitted for right L4-5 decompressive hemilaminectomy for spinal stenosis. Patient has failed a reasonable attempt at conservative therapy.  I explained the condition and procedure to the patient and answered any questions.  Patient wishes to proceed with procedure as planned. Understands risks/ benefits and typical outcomes of  procedure.   [name] 09/18/2021 11:08 AM

## 2021-09-18 NOTE — Op Note (Signed)
09/18/2021  1:08 PM  PATIENT:  [name]  61 y.o. female  PRE-OPERATIVE DIAGNOSIS: Lumbar spinal stenosis L4-5 with right L5 radiculopathy  POST-OPERATIVE DIAGNOSIS:  same  PROCEDURE: Decompressive lumbar hemilaminectomy medial facetectomy and foraminotomies L4-5 on the right  SURGEON:  Sherley Bounds, MD  ASSISTANTS: Glenford Peers FNP  ANESTHESIA:   General  EBL: 25 ml  Total I/O In: 700 [I.V.:600; IV Piggyback:100] Out: 25 [Blood:25]  BLOOD ADMINISTERED: none  DRAINS: None  SPECIMEN:  none  INDICATION FOR PROCEDURE: This patient presented with right leg pain in an L5 distribution. Imaging showed bar spinal stenosis L4-5 above previous L5-S1 fusion. The patient tried conservative measures without relief. Pain was debilitating. Recommended right L4-5 hemilaminectomy for decompression of the L5 nerve root. Patient understood the risks, benefits, and alternatives and potential outcomes and wished to proceed.  PROCEDURE DETAILS: The patient was taken to the operating room and after induction of adequate generalized endotracheal anesthesia, the patient was rolled into the prone position on the Wilson frame and all pressure points were padded. The lumbar region was cleaned and then prepped with DuraPrep and draped in the usual sterile fashion. 5 cc of local anesthesia was injected and then a dorsal midline incision was made and carried down to the lumbo sacral fascia. The fascia was opened and the paraspinous musculature was taken down in a subperiosteal fashion to expose L4-5 on the right. Intraoperative x-ray confirmed my level, and then I used a combination of the high-speed drill and the Kerrison punches to perform a hemilaminectomy, medial facetectomy, and foraminotomy at L4-5 on the right. The underlying yellow ligament was opened and removed in a piecemeal fashion to expose the underlying dura and exiting nerve root. I undercut the lateral recess and dissected down until I was  medial to and distal to the pedicle. The nerve root was well decompressed. We then gently retracted the nerve root medially with a retractor, coagulated the epidural venous vasculature, and inspected the disc space.  There was a bulging annulus but because this was above a previous fusion I felt that incising the annulus performed microdiscectomy would lead to significant risk of recurrent disc herniation.  Therefore a used the bipolar to shrink the annulus.  I then palpated with a coronary dilator along the nerve root and into the foramen to assure adequate decompression. I felt no more compression of the nerve root. I irrigated with saline solution containing bacitracin. Achieved hemostasis with bipolar cautery, and then closed the fascia with 0 Vicryl. I closed the subcutaneous tissues with 2-0 Vicryl and the subcuticular tissues with 3-0 Vicryl. The skin was then closed with benzoin and Steri-Strips. The drapes were removed, a sterile dressing was applied.  My nurse practitioner was involved in the exposure, the decompression, safe retraction of the neural elements, and the closure. the patient was awakened from general anesthesia and transferred to the recovery room in stable condition. At the end of the procedure all sponge, needle and instrument counts were correct.    PLAN OF CARE: Admit for overnight observation  PATIENT DISPOSITION:  PACU - hemodynamically stable.   Delay start of Pharmacological VTE agent (>24hrs) due to surgical blood loss or risk of bleeding:  yes

## 2021-09-18 NOTE — Discharge Summary (Signed)
Physician Discharge Summary  Patient ID: [name] MRN: [id number] DOB/AGE: 62/04/1960 62 y.o.  Admit date: 09/18/2021 Discharge date: 09/18/2021  Admission Diagnoses: lumbar radiculopathy   Discharge Diagnoses: same   Discharged Condition: good  Hospital Course: The patient was admitted on 09/18/2021 and taken to the operating room where the patient underwent R L4-5 decompressive laminectomy. The patient tolerated the procedure well and was taken to the recovery room and then to the floor in stable condition. The hospital course was routine. There were no complications. The wound remained clean dry and intact. Pt had appropriate back soreness. No complaints of leg pain or new N/T/W. The patient remained afebrile with stable vital signs, and tolerated a regular diet. The patient continued to increase activities, and pain was well controlled with oral pain medications.   Consults: None  Significant Diagnostic Studies:  Results for orders placed or performed during the hospital encounter of 09/18/21  Glucose, capillary  Result Value Ref Range   Glucose-Capillary 125 (H) 70 - 99 mg/dL  Glucose, capillary  Result Value Ref Range   Glucose-Capillary 149 (H) 70 - 99 mg/dL    DG Lumbar Spine 2-3 Views  Result Date: 09/18/2021 CLINICAL DATA:  L4-5 Lami/Foram EXAM: LUMBAR SPINE - 2 VIEW COMPARISON:  None Available. FINDINGS: Localization instruments are at the level of the superior L5 endplate. Anterior fusion of L5-S1. No acute osseous abnormality. IMPRESSION: Localization instruments are at the level of the superior L5 endplate. Electronically Signed   By: Yetta Glassman M.D.   On: 09/18/2021 13:02    Antibiotics:  Anti-infectives (From admission, onward)    Start     Dose/Rate Route Frequency Ordered Stop   09/18/21 2000  ceFAZolin (ANCEF) IVPB 2g/100 mL premix        2 g 200 mL/hr over 30 Minutes Intravenous Every 8 hours 09/18/21 1431 09/19/21 1159   09/18/21 0915  ceFAZolin  (ANCEF) IVPB 2g/100 mL premix        2 g 200 mL/hr over 30 Minutes Intravenous On call to O.R. 09/18/21 0912 09/18/21 1210       Discharge Exam: Blood pressure 128/76, pulse (!) 59, temperature 98 F (36.7 C), resp. rate 18, height '5\' 2"'$  (1.575 m), weight 66.7 kg, last menstrual period 05/04/2008, SpO2 96 %. Neurologic: Grossly normal Dressing CDI  Discharge Medications:   Allergies as of 09/18/2021       Reactions   Celebrex [celecoxib] Swelling   Face swelling   Mobic [meloxicam] Swelling   Face swelling        Medication List     TAKE these medications    acetaminophen 500 MG tablet Commonly known as: TYLENOL Take 500 mg by mouth daily as needed for mild pain.   ALPRAZolam 0.5 MG tablet Commonly known as: XANAX Take 0.5 mg by mouth at bedtime.   atorvastatin 80 MG tablet Commonly known as: LIPITOR Take 80 mg by mouth every evening.   buPROPion 150 MG 12 hr tablet Commonly known as: Wellbutrin SR Take 1 tablet (150 mg total) by mouth 2 (two) times daily.   cyanocobalamin 1000 MCG/ML injection Commonly known as: (VITAMIN B-12) Inject 1,000 mcg into the muscle every 14 (fourteen) days.   finasteride 5 MG tablet Commonly known as: PROSCAR Take 2.5 mg by mouth every evening.   FLUoxetine 40 MG capsule Commonly known as: PROZAC Take 80 mg by mouth every evening.   folic acid 1 MG tablet Commonly known as: FOLVITE Take 1 mg by mouth  at bedtime.   furosemide 40 MG tablet Commonly known as: LASIX Take 20-40 mg by mouth daily as needed for fluid.   linaclotide 290 MCG Caps capsule Commonly known as: LINZESS Take 290 mcg by mouth at bedtime.   methocarbamol 750 MG tablet Commonly known as: Robaxin-750 Take 1 tablet (750 mg total) by mouth 4 (four) times daily.   metoprolol tartrate 25 MG tablet Commonly known as: LOPRESSOR Take 1 tablet (25 mg total) by mouth 2 (two) times daily.   nitroGLYCERIN 0.4 MG SL tablet Commonly known as: NITROSTAT Place  1 tablet (0.4 mg total) under the tongue every 5 (five) minutes as needed for chest pain.   NovoLOG FlexPen 100 UNIT/ML FlexPen Generic drug: insulin aspart Inject 0-10 Units into the skin 3 (three) times daily with meals. Per sliding scale   oxyCODONE-acetaminophen 5-325 MG tablet Commonly known as: Percocet Take 1 tablet by mouth every 4 (four) hours as needed for severe pain.   Ozempic (0.25 or 0.5 MG/DOSE) 2 MG/3ML Sopn Generic drug: Semaglutide(0.25 or 0.'5MG'$ /DOS) Inject 0.5 mg into the skin every Wednesday.   pantoprazole 40 MG tablet Commonly known as: PROTONIX Take 40 mg by mouth at bedtime.   traMADol 50 MG tablet Commonly known as: ULTRAM Take 50 mg by mouth daily as needed for moderate pain.   Tyler Aas FlexTouch 200 UNIT/ML FlexTouch Pen Generic drug: insulin degludec Inject 32 Units into the skin in the morning.        Disposition: home   Final Dx: R L4-5 decompressive laminectomy  Discharge Instructions      Remove dressing in 72 hours   Complete by: As directed    Call MD for:  difficulty breathing, headache or visual disturbances   Complete by: As directed    Call MD for:  hives   Complete by: As directed    Call MD for:  persistant dizziness or light-headedness   Complete by: As directed    Call MD for:  persistant nausea and vomiting   Complete by: As directed    Call MD for:  redness, tenderness, or signs of infection (pain, swelling, redness, odor or green/yellow discharge around incision site)   Complete by: As directed    Call MD for:  severe uncontrolled pain   Complete by: As directed    Call MD for:  temperature >100.4   Complete by: As directed    Diet - low sodium heart healthy   Complete by: As directed    Driving Restrictions   Complete by: As directed    No driving for 2 weeks, no riding in the car for 1 week   Increase activity slowly   Complete by: As directed    Lifting restrictions   Complete by: As directed    No lifting  more than 8 lbs          Signed: Eustace Moore 09/18/2021, 3:59 PM

## 2021-09-18 NOTE — Anesthesia Procedure Notes (Addendum)
Procedure Name: Intubation Date/Time: 09/18/2021 12:06 PM Performed by: Vonna Drafts, CRNA Pre-anesthesia Checklist: Patient identified, Emergency Drugs available, Suction available and Patient being monitored Patient Re-evaluated:Patient Re-evaluated prior to induction Oxygen Delivery Method: Circle system utilized Preoxygenation: Pre-oxygenation with 100% oxygen Induction Type: IV induction and Cricoid Pressure applied Ventilation: Mask ventilation without difficulty Laryngoscope Size: Mac and 3 Grade View: Grade I Tube type: Oral Tube size: 7.0 mm Number of attempts: 1 Airway Equipment and Method: Stylet, Oral airway and Bite block Placement Confirmation: ETT inserted through vocal cords under direct vision, positive ETCO2 and breath sounds checked- equal and bilateral Secured at: 20 cm Tube secured with: Tape Dental Injury: Teeth and Oropharynx as per pre-operative assessment

## 2021-09-19 ENCOUNTER — Encounter (HOSPITAL_COMMUNITY): Payer: Self-pay | Admitting: Neurological Surgery

## 2021-09-19 IMAGING — CR DG OR LOCAL ABDOMEN
1 series · 1 of 1 positions shown · non-contrast
Comparison: None.

CLINICAL DATA: Final instrument count.

EXAM:
OR LOCAL ABDOMEN

[AP]
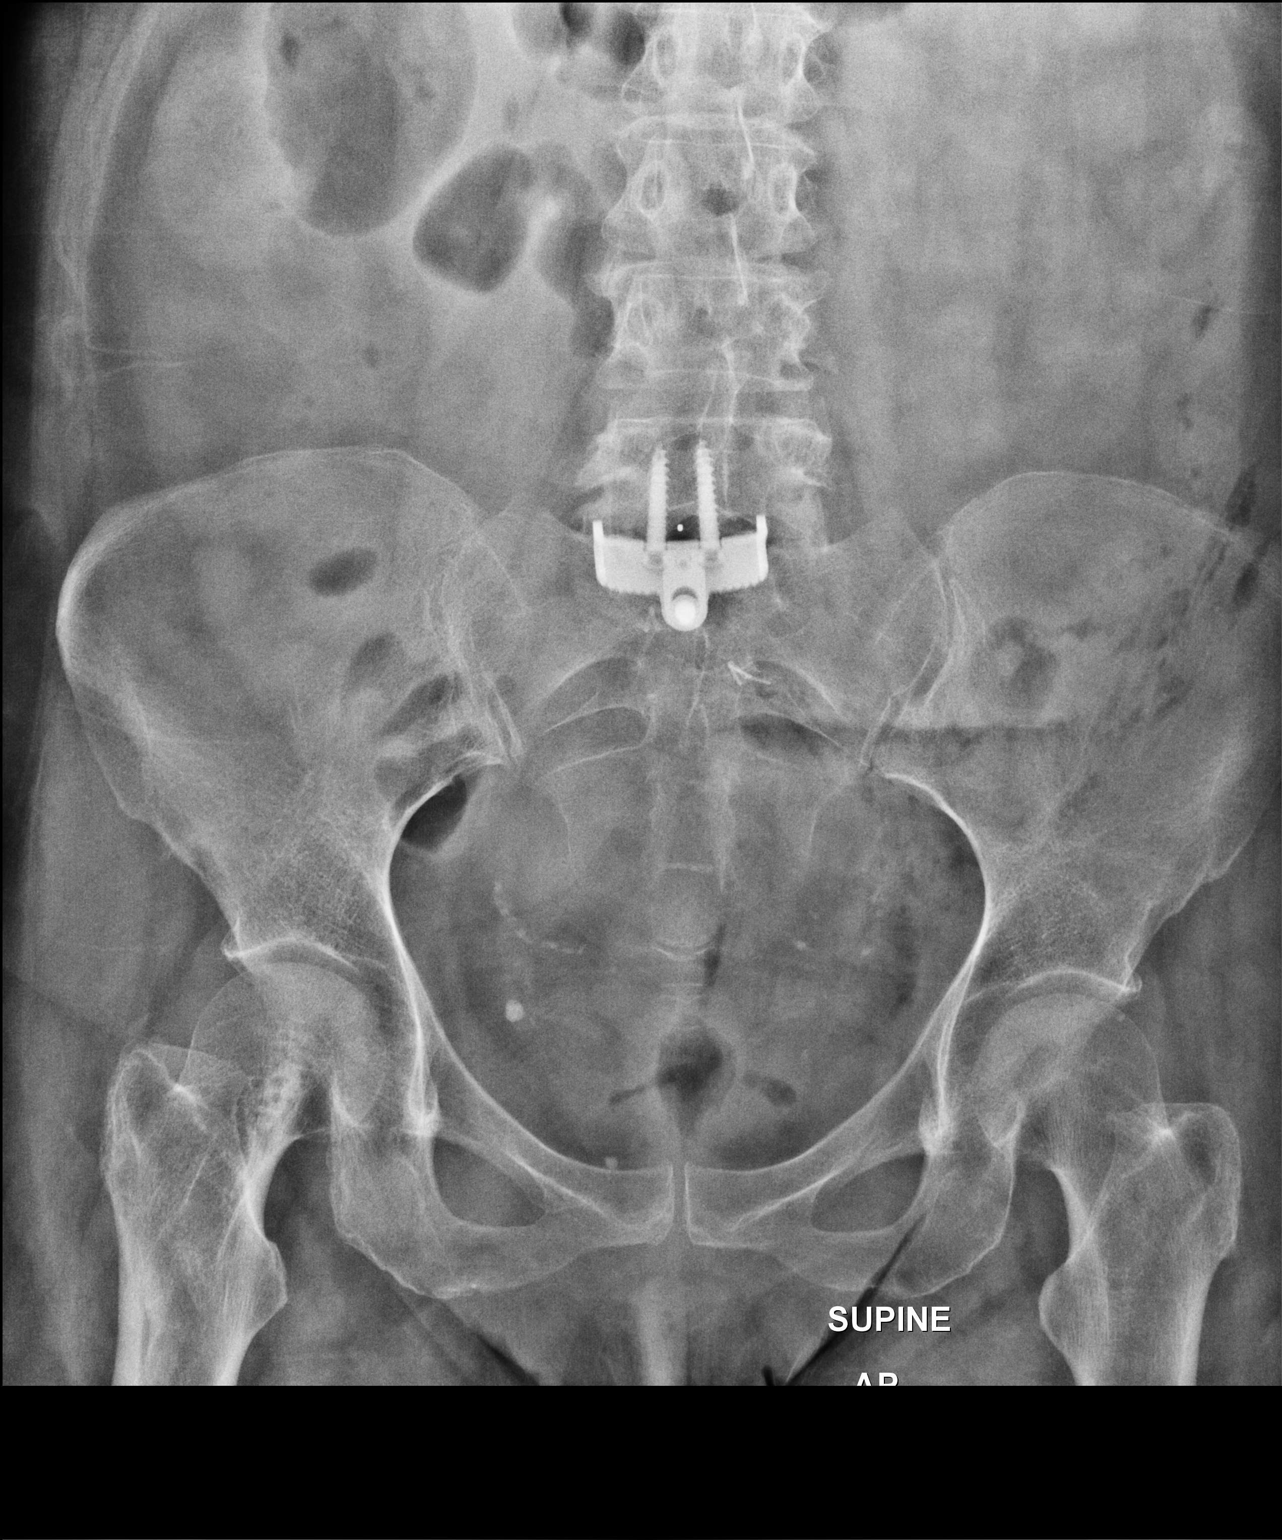

[1 of 1 positions shown; findings below may reference images not displayed]

FINDINGS: The bowel gas pattern is normal. Phleboliths are noted in the
pelvis. Status post surgical anterior fusion of L5-S1. Surgical
clips are seen anterior to the sacrum. No definite evidence of other
foreign body is noted.
IMPRESSION: Surgical clips are seen anterior to the sacrum. No definite evidence
of other foreign body is noted. These results were called by
telephone at the time of interpretation on 01/11/2020 at [DATE] to
provider Dyrmishi Moto, who verbally acknowledged these results.

## 2021-09-19 MED FILL — Thrombin For Soln 5000 Unit: CUTANEOUS | Qty: 2 | Status: AC

## 2021-09-20 NOTE — Progress Notes (Unsigned)
Office Visit Note  Patient: [name]             Date of Birth: 1961-01-15           MRN: [id number]             PCP: [name], MD Referring: [name], MD Visit Date: 09/25/2021 Occupation: '@GUAROCC'$ @  Subjective:  No chief complaint on file.   History of Present Illness: [name] is a 61 y.o. female ***   Activities of Daily Living:  Patient reports morning stiffness for *** {minute/hour:19697}.   Patient {ACTIONS;DENIES/REPORTS:21021675::"Denies"} nocturnal pain.  Difficulty dressing/grooming: {ACTIONS;DENIES/REPORTS:21021675::"Denies"} Difficulty climbing stairs: {ACTIONS;DENIES/REPORTS:21021675::"Denies"} Difficulty getting out of chair: {ACTIONS;DENIES/REPORTS:21021675::"Denies"} Difficulty using hands for taps, buttons, cutlery, and/or writing: {ACTIONS;DENIES/REPORTS:21021675::"Denies"}  No Rheumatology ROS completed.   PMFS History:  Patient Active Problem List   Diagnosis Date Noted   S/P lumbar laminectomy 09/18/2021   Accelerating angina (HCC) 05/06/2021   Complex regional pain syndrome of lower limb 03/07/2021   Pelvic pain 12/14/2020   RUQ pain 11/09/2020   Elevated alkaline phosphatase level 76/72/0947   Uncomplicated type 1 diabetes mellitus ([place]) 10/22/2020   Lipoma of left upper extremity 09/04/2020   Irritable bowel syndrome 09/03/2020   Idiopathic peripheral neuropathy 08/29/2020   Lipoma of abdominal wall 08/08/2020   Fever 07/18/2020   Hypotension 07/18/2020   Sepsis ([place]) 07/18/2020   Pneumonia 07/18/2020   Arthritis 07/18/2020   Blurred vision 07/11/2020   Facial tingling 07/11/2020   Diabetes mellitus without complication ([place])    Acute left ankle pain 03/09/2020   History of pneumonia 01/25/2020   History of sepsis 01/25/2020   Respiratory failure ([place]) 01/13/2020   HAP (hospital-acquired pneumonia) 01/13/2020   Fusion of lumbar spine 01/11/2020   Trigger thumb of right hand 11/14/2019   Low back pain  10/22/2019   Obsessive-compulsive disorder 10/14/2019   Diabetes mellitus due to underlying condition with unspecified complications ([place]) 09/62/8366   Primary osteoarthritis involving multiple joints 08/24/2019   Alcohol use 04/15/2019   Elevated LFTs 03/10/2019   Lumbar post-laminectomy syndrome 03/04/2019   Acute bilateral thoracic back pain 12/03/2018   Pain of left hip joint 07/23/2018   Polyarthralgia 06/11/2018   Bursitis of both hips 04/14/2018   Left foot pain 01/28/2018   CAD (coronary artery disease) 12/24/2017   Secondary diabetes mellitus ([place]) 12/24/2017   Coronary arteriosclerosis 12/24/2017   Dyspnea on exertion 12/04/2017   Attention deficit 12/04/2017   Intermittent claudication ([place]) 12/04/2017   History of metabolic disorder 29/47/6546   Lung mass 12/04/2017   Myositis 12/04/2017   Erythrocytosis 12/04/2017   Senile purpura ([place]) 12/04/2017   Chest pain 11/06/2017   Multiple actinic keratoses 11/06/2017   Degeneration of lumbar intervertebral disc 11/06/2017   Fatigue 11/06/2017   Insulin long-term use ([place]) 11/06/2017   Menopausal disorder 11/06/2017   Purpura ([place]) 11/06/2017   Moderate chronic obstructive pulmonary disease ([place]) 50/35/4656   Folic acid deficiency 81/27/5170   Malaise and fatigue 11/06/2017   Arteriosclerotic vascular disease 11/06/2017   Dysphagia 11/04/2017   Sciatica 11/03/2017   Scoliosis deformity of spine 11/03/2017   Lumbar radiculopathy 05/26/2017   Personal history of (healed) traumatic fracture 12/31/2016   Deficiency of other specified B group vitamins 11/07/2016   Mixed hyperlipidemia 04/09/2016   Smoking greater than 30 pack years 02/26/2016   Cigarette smoker 02/26/2016   Nicotine dependence, cigarettes, uncomplicated 01/74/9449   Diabetes mellitus type 1, uncomplicated ([place]) 67/59/1638   Tobacco abuse 02/11/2016  Tobacco user 02/11/2016   Pure hypercholesterolemia 02/11/2016   Atherosclerosis of native coronary  artery of native heart without angina pectoris 02/11/2016   Coronary artery disease of native artery of native heart with stable angina pectoris ([place]) 01/25/2016   Abnormal thyroid function test 12/06/2015   Chronic bronchitis ([place]) 12/04/2015   Overweight with body mass index (BMI) 25.0-29.9 07/11/2015   BMI 27.0-27.9,adult 07/11/2015   Pain in both lower extremities 07/10/2015   Anxiety disorder 06/05/2015   Atherosclerosis 06/05/2015   Hemangioma 06/05/2015   Primary insomnia 06/05/2015   Trichotillomania 06/05/2015   HNP (herniated nucleus pulposus), lumbar 05/10/2014    Past Medical History:  Diagnosis Date   Abnormal thyroid function test 12/06/2015   Acute bilateral thoracic back pain 12/03/2018   Acute left ankle pain 03/09/2020   Alcohol use 04/15/2019   Formatting of this note might be different from the original. Recommend abstinence from alcohol   Anxiety disorder 06/05/2015   Last Assessment & Plan:  Formatting of this note might be different from the original. Had started her on some xanax about week ago and she feels it has helped her and she is taking about half tablet for this and will continue with it as she has been compliant with it   Arteriosclerotic vascular disease 11/06/2017   Arthritis    back    Atherosclerosis 06/05/2015   Formatting of this note might be different from the original. Seen on CTs of chest.   Atherosclerosis of native coronary artery of native heart without angina pectoris 02/11/2016   Attention deficit 12/04/2017   Blurred vision 07/11/2020   BMI 27.0-27.9,adult 07/11/2015   Last Assessment & Plan:  Formatting of this note might be different from the original. Relevant Hx: Course: Daily Update: Today's Plan:difficult for her with the insulin as well as her abilify and the weight is increaseing for her rather than decreasing, she has cut her alcohol intake back, she has worked on her diet more with decreasing her carb intake as well and still  difficulty discussed with    Bursitis of both hips 04/14/2018   CAD (coronary artery disease) 12/24/2017   Chest pain 11/06/2017   Chronic bronchitis ([place]) 12/04/2015   Formatting of this note might be different from the original. PFT ratio 88% FEV1 103% FVC 94% DLCO 76%  Last Assessment & Plan:  Formatting of this note might be different from the original. We discussed this as she has been coughing again for about 2 mnths and she sounds congested on exam with previous ease for pneumonia will place her on levaquin and have reviewed with her the importance of her    Cigarette smoker 02/26/2016   Coronary arteriosclerosis 12/24/2017   Coronary artery disease of native artery of native heart with stable angina pectoris ([place]) 01/25/2016   Formatting of this note might be different from the original. 2017:  50% LAD lesion.  Calcium noted other places.  Heart cath December 31, 2017 with a 30% ostial LAD lesion otherwise normal coronary arteries seen with LVEF 55 to 65%   Deficiency of other specified B group vitamins 11/07/2016   Degeneration of lumbar intervertebral disc 11/06/2017   Diabetes mellitus due to underlying condition with unspecified complications ([place]) 83/41/9622   Diabetes mellitus type 1, uncomplicated ([place]) 29/79/8921   Added automatically from request for surgery [id number]  Formatting of this note might be different from the original. Added automatically from request for surgery [id number]   Diabetes mellitus without complication ([place])  Type 1- injections only- recent change to [place] with improved A1C reading.   Dysphagia 11/04/2017   Dyspnea on exertion 12/04/2017   Elevated alkaline phosphatase level 11/09/2020   Elevated LFTs 03/10/2019   Formatting of this note might be different from the original. History.  Cannot change to this.   Erythrocytosis 12/04/2017   Facial tingling 07/11/2020   Fatigue 11/06/2017   Fever 41/96/2229   Folic acid deficiency 79/89/2119   Fusion of  lumbar spine 01/11/2020   HAP (hospital-acquired pneumonia) 01/13/2020   Hemangioma 11/06/2017   History of metabolic disorder 41/74/0814   History of pneumonia 01/25/2020   Formatting of this note might be different from the original. Recent mutifocal pneumonia September 2021 Formatting of this note might be different from the original. Formatting of this note might be different from the original. Recent mutifocal pneumonia September 2021   History of sepsis 01/25/2020   Formatting of this note might be different from the original. Recent septic syndrome with multifocal pneumonia Formatting of this note might be different from the original. Formatting of this note might be different from the original. Recent septic syndrome with multifocal pneumonia   HNP (herniated nucleus pulposus), lumbar 05/10/2014   Hypotension 07/18/2020   Idiopathic peripheral neuropathy 08/29/2020   Insulin long-term use ([place]) 11/06/2017   Intermittent claudication ([place]) 12/04/2017   Irritable bowel syndrome 09/03/2020   Left foot pain 01/28/2018   Lipoma of abdominal wall 08/08/2020   Lipoma of left upper extremity 09/04/2020   Low back pain 10/22/2019   Lumbar post-laminectomy syndrome 03/04/2019   Lumbar radiculopathy 05/26/2017   Lung mass 12/04/2017   Malaise and fatigue 11/06/2017   Menopausal disorder 11/06/2017   Mixed hyperlipidemia 04/09/2016   Formatting of this note might be different from the original. Added automatically from request for surgery [id number]   Multiple actinic keratoses 11/06/2017   Myositis 12/04/2017   Nicotine dependence, cigarettes, uncomplicated 31/49/7026   Obsessive-compulsive disorder 10/14/2019   Formatting of this note might be different from the original. Formerly followed with [facility].   Overweight with body mass index (BMI) 25.0-29.9 07/11/2015   Last Assessment & Plan:  Relevant Hx: Course: Daily Update: Today's Plan:difficult for her with the insulin as well as her  abilify and the weight is increaseing for her rather than decreasing, she has cut her alcohol intake back, she has worked on her diet more with decreasing her carb intake as well and still difficulty discussed with the saxenda and will try to get this approved for her and see    Pain in both lower extremities 07/10/2015   Last Assessment & Plan:  Formatting of this note might be different from the original. Relevant Hx: Course: Daily Update: Today's Plan:she had pain to her right anterior thigh there is edema of both legs and she has fear for DVT which she is going to have [place] of and confirm ok, she has increased her weight which is part of her edema I believe as well as her insulin usage. She is advised as well tha   Pain of left hip joint 07/23/2018   Pelvic pain 12/14/2020   Personal history of (healed) traumatic fracture 12/31/2016   Pneumonia    Polyarthralgia 06/11/2018   Primary insomnia 06/05/2015   Primary osteoarthritis involving multiple joints 08/24/2019   Pure hypercholesterolemia 02/11/2016   Purpura ([place]) 11/06/2017   Respiratory failure ([place]) 11/2018   RUQ pain 11/09/2020   Sciatica 11/03/2017   Scoliosis deformity of spine 11/03/2017  Secondary diabetes mellitus ([place]) 12/24/2017   Senile purpura ([place]) 12/04/2017   Sepsis ([place]) 07/18/2020   Smoking greater than 30 pack years 02/26/2016   Tobacco abuse 02/11/2016   Tobacco user 02/11/2016   Trichotillomania    hx.    Trigger thumb of right hand 84/69/6295   Uncomplicated type 1 diabetes mellitus ([place]) 10/22/2020    Family History  Problem Relation Age of Onset   Diabetes Mother    Cancer Mother    Hypertension Father    Heart disease Father    Diabetes Father    Healthy Brother    Healthy Son    Diabetes Son    Healthy Daughter    Past Surgical History:  Procedure Laterality Date   ABDOMINAL EXPOSURE N/A 01/11/2020   Procedure: ABDOMINAL EXPOSURE;  Surgeon: Marty Heck, MD;  Location: [facility];  Service:  Vascular;  Laterality: N/A;   ANTERIOR LUMBAR FUSION N/A 01/11/2020   Procedure: ANTERIOR LUMBAR INTERBODY FUSION  LUMBAR FIVE-SACRAL ONE, LEFT LUMBAR TWO-THREE FORAMINOTOMY, DISECTOMY;  Surgeon: Melina Schools, MD;  Location: [facility];  Service: Orthopedics;  Laterality: N/A;   APPENDECTOMY  04/28/1970   BACK SURGERY  2016   CARDIAC CATHETERIZATION  12/31/2017   COLONOSCOPY  04/28/2017   EXCISION/RELEASE BURSA HIP Left 04/14/2018   Procedure: Excision trochanteric bursa left hip;  Surgeon: Susa Day, MD;  Location: [facility];  Service: Orthopedics;  Laterality: Left;  60 mins   HAND SURGERY     HIP OPEN REDUCTION Right    ORIF   INTRAVASCULAR PRESSURE WIRE/FFR STUDY N/A 05/06/2021   Procedure: INTRAVASCULAR PRESSURE WIRE/FFR STUDY;  Surgeon: Nelva Bush, MD;  Location: [facility];  Service: Cardiovascular;  Laterality: N/A;   KNEE ARTHROSCOPY     LEFT HEART CATH AND CORONARY ANGIOGRAPHY N/A 12/31/2017   Procedure: LEFT HEART CATH AND CORONARY ANGIOGRAPHY;  Surgeon: Martinique, Peter M, MD;  Location: [facility];  Service: Cardiovascular;  Laterality: N/A;   LEFT HEART CATH AND CORONARY ANGIOGRAPHY N/A 05/06/2021   Procedure: LEFT HEART CATH AND CORONARY ANGIOGRAPHY;  Surgeon: Nelva Bush, MD;  Location: [facility];  Service: Cardiovascular;  Laterality: N/A;   LUMBAR LAMINECTOMY/DECOMPRESSION MICRODISCECTOMY Left 05/10/2014   Procedure: MICRODISCECTOMY LUMBAR DECOMPRESSION L5-S1 LEFT ;  Surgeon: Johnn Hai, MD;  Location: [facility];  Service: Orthopedics;  Laterality: Left;   LUMBAR LAMINECTOMY/DECOMPRESSION MICRODISCECTOMY N/A 01/11/2020   Procedure: LUMBAR LAMINECTOMY/DECOMPRESSION MICRODISCECTOMY LEFT LUMBAR TWO-THREE;  Surgeon: Melina Schools, MD;  Location: [facility];  Service: Orthopedics;  Laterality: N/A;   LUMBAR LAMINECTOMY/DECOMPRESSION MICRODISCECTOMY Right 09/18/2021   Procedure: Laminectomy and Foraminotomy - Lumbar Four-Lumbar Five - right;  Surgeon: Eustace Moore, MD;  Location: [facility];  Service: Neurosurgery;  Laterality: Right;  Laminectomy and Foraminotomy - Lumbar Four-Lumbar Five - right   ORIF WRIST FRACTURE Right    retained hardware   ORTHOPAEDIC SURGERY  04/14/2018   TUBAL LIGATION  12/25/1995   Social History   Social History Narrative   Not on file   Immunization History  Administered Date(s) Administered   Influenza,inj,Quad PF,6+ Mos 01/21/2016, 12/31/2016, 02/09/2018, 01/18/2021   Influenza,inj,quad, With Preservative 12/27/2016   Influenza-Unspecified 01/21/2016, 12/31/2016   Moderna Sars-Covid-2 Vaccination 04/29/2019, 05/30/2019   Pneumococcal Conjugate-13 07/23/2017   Pneumococcal Polysaccharide-23 02/01/2021     Objective: Vital Signs: LMP 05/04/2008    Physical Exam   Musculoskeletal Exam: ***  CDAI Exam: CDAI Score: -- Patient Global: --; Provider Global: -- Swollen: --; Tender: --  Joint Exam 09/25/2021   No joint exam has been documented for this visit   There is currently no information documented on the homunculus. Go to the Rheumatology activity and complete the homunculus joint exam.  Investigation: No additional findings.  Imaging: DG Lumbar Spine 2-3 Views  Result Date: 09/18/2021 CLINICAL DATA:  L4-5 Lami/Foram EXAM: LUMBAR SPINE - 2 VIEW COMPARISON:  None Available. FINDINGS: Localization instruments are at the level of the superior L5 endplate. Anterior fusion of L5-S1. No acute osseous abnormality. IMPRESSION: Localization instruments are at the level of the superior L5 endplate. Electronically Signed   By: Yetta Glassman M.D.   On: 09/18/2021 13:02    Recent Labs: Lab Results  Component Value Date   WBC 7.0 09/11/2021   HGB 14.0 09/11/2021   PLT 219 09/11/2021   NA 138 09/11/2021   K 4.2 09/11/2021   CL 106 09/11/2021   CO2 27 09/11/2021   GLUCOSE 150 (H) 09/11/2021   BUN 9 09/11/2021   CREATININE 0.64 09/11/2021   BILITOT 1.5 (H) 01/13/2020   ALKPHOS 68 01/13/2020   AST 35  01/13/2020   ALT 31 01/13/2020   PROT 6.1 06/04/2021   ALBUMIN 2.4 (L) 01/13/2020   CALCIUM 9.2 09/11/2021   GFRAA >60 01/21/2020   June 04, 2021 SPEP normal, antigliadin negative, TTG negative, PTH 80, vitamin D 35, TSH normal  Speciality Comments: No specialty comments available.  Procedures:  No procedures performed Allergies: Celebrex [celecoxib] and Mobic [meloxicam]   Assessment / Plan:     Visit Diagnoses: Age-related osteoporosis without current pathological fracture  Trigger thumb of right hand  Trigger thumb, left thumb  Lumbar radiculopathy  Fusion of lumbar spine  Coronary artery disease of native artery of native heart with stable angina pectoris ([place])  Atherosclerosis  Pure hypercholesterolemia  Diabetes mellitus type 1, uncomplicated (HCC)  Moderate chronic obstructive pulmonary disease (HCC)  History of gastroesophageal reflux (GERD)  Nicotine dependence, cigarettes, uncomplicated  History of anxiety  Multiple actinic keratoses  Trichotillomania  History of sepsis  Orders: No orders of the defined types were placed in this encounter.  No orders of the defined types were placed in this encounter.   Face-to-face time spent with patient was *** minutes. Greater than 50% of time was spent in counseling and coordination of care.  Follow-Up Instructions: No follow-ups on file.   Bo Merino, MD  Note - This record has been created using Editor, commissioning.  Chart creation errors have been sought, but may not always  have been located. Such creation errors do not reflect on  the standard of medical care.

## 2021-09-25 ENCOUNTER — Encounter: Payer: Self-pay | Admitting: Rheumatology

## 2021-09-25 ENCOUNTER — Ambulatory Visit (INDEPENDENT_AMBULATORY_CARE_PROVIDER_SITE_OTHER): Payer: BC Managed Care – PPO | Admitting: Rheumatology

## 2021-09-25 ENCOUNTER — Telehealth: Payer: Self-pay | Admitting: Pharmacist

## 2021-09-25 VITALS — BP 91/61 | HR 62 | Ht 62.0 in | Wt 147.0 lb

## 2021-09-25 DIAGNOSIS — M65311 Trigger thumb, right thumb: Secondary | ICD-10-CM

## 2021-09-25 DIAGNOSIS — M5416 Radiculopathy, lumbar region: Secondary | ICD-10-CM

## 2021-09-25 DIAGNOSIS — Z8719 Personal history of other diseases of the digestive system: Secondary | ICD-10-CM

## 2021-09-25 DIAGNOSIS — M5136 Other intervertebral disc degeneration, lumbar region: Secondary | ICD-10-CM | POA: Diagnosis not present

## 2021-09-25 DIAGNOSIS — J449 Chronic obstructive pulmonary disease, unspecified: Secondary | ICD-10-CM

## 2021-09-25 DIAGNOSIS — Z8619 Personal history of other infectious and parasitic diseases: Secondary | ICD-10-CM

## 2021-09-25 DIAGNOSIS — M810 Age-related osteoporosis without current pathological fracture: Secondary | ICD-10-CM

## 2021-09-25 DIAGNOSIS — Z8659 Personal history of other mental and behavioral disorders: Secondary | ICD-10-CM

## 2021-09-25 DIAGNOSIS — I25118 Atherosclerotic heart disease of native coronary artery with other forms of angina pectoris: Secondary | ICD-10-CM

## 2021-09-25 DIAGNOSIS — M65312 Trigger thumb, left thumb: Secondary | ICD-10-CM | POA: Diagnosis not present

## 2021-09-25 DIAGNOSIS — E7800 Pure hypercholesterolemia, unspecified: Secondary | ICD-10-CM

## 2021-09-25 DIAGNOSIS — I7090 Unspecified atherosclerosis: Secondary | ICD-10-CM

## 2021-09-25 DIAGNOSIS — F633 Trichotillomania: Secondary | ICD-10-CM

## 2021-09-25 DIAGNOSIS — F17210 Nicotine dependence, cigarettes, uncomplicated: Secondary | ICD-10-CM

## 2021-09-25 DIAGNOSIS — M4326 Fusion of spine, lumbar region: Secondary | ICD-10-CM

## 2021-09-25 DIAGNOSIS — E109 Type 1 diabetes mellitus without complications: Secondary | ICD-10-CM

## 2021-09-25 DIAGNOSIS — L570 Actinic keratosis: Secondary | ICD-10-CM

## 2021-09-25 NOTE — Patient Instructions (Signed)
Teriparatide injection What is this medication? TERIPARATIDE (terr ih PAR a tyd) increases bone mass and strength. It helps to make healthy bone and to slow bone loss in people with osteoporosis. This medicine helps prevent bone fractures. This medicine may be used for other purposes; ask your health care provider or pharmacist if you have questions. COMMON BRAND NAME(S): FORTEO What should I tell my care team before I take this medication? They need to know if you have any of these conditions: bone disease other than osteoporosis high levels of calcium in the blood history of cancer in the bone kidney stone Paget's disease parathyroid disease receiving radiation therapy an unusual or allergic reaction to teriparatide, other medicines, foods, dyes, or preservatives pregnant or trying to get pregnant breast-feeding How should I use this medication? This medicine is for injection under the skin. You will be taught how to prepare and give this medicine. Use exactly as directed. Take your medicine at regular intervals. Do not take your medicine more often than directed. This drug comes with INSTRUCTIONS FOR USE. Ask your pharmacist for directions on how to use this drug. Read the information carefully. Talk to your pharmacist or health care provider if you have questions. It is important that you put your used needles and pens in a special sharps container. Do not put them in a trash can. If you do not have a sharps container, call your pharmacist or health care provider to get one. A special MedGuide will be given to you by the pharmacist with each prescription and refill. Be sure to read this information carefully each time. Talk to your pediatrician regarding the use of this medicine in children. Special care may be needed. Overdosage: If you think you have taken too much of this medicine contact a poison control center or emergency room at once. NOTE: This medicine is only for you. Do not  share this medicine with others. What if I miss a dose? If you miss a dose, take it as soon as you can. If it is almost time for your next dose, take only that dose. Do not take double or extra doses. What may interact with this medication? digoxin This list may not describe all possible interactions. Give your health care provider a list of all the medicines, herbs, non-prescription drugs, or dietary supplements you use. Also tell them if you smoke, drink alcohol, or use illegal drugs. Some items may interact with your medicine. What should I watch for while using this medication? Visit your doctor or health care professional for regular checks on your progress. Your doctor may order blood tests and other tests to see how you are doing. You should make sure you get enough calcium and vitamin D while you are taking this medicine, unless your doctor tells you not to. Discuss the foods you eat and the vitamins you take with your health care professional. Dennis Bast may get drowsy or dizzy. Do not drive, use machinery, or do anything that needs mental alertness until you know how this medicine affects you. Do not stand or sit up quickly, especially if you are an older patient. This reduces the risk of dizzy or fainting spells. Talk to your doctor about your risk of cancer. You may be more at risk for certain types of cancers if you take this medicine. What side effects may I notice from receiving this medication? Side effects that you should report to your doctor or health care professional as soon as possible: allergic reactions  like skin rash, itching or hives, swelling of the face, lips, or tongue blood in the urine; pain in the lower back or side; pain when urinating signs and symptoms of low blood pressure like dizziness; feeling faint or lightheaded, falls; unusually weak or tired signs and symptoms of increased calcium like nausea; vomiting; constipation; low energy; or muscle weakness Side effects  that usually do not require medical attention (report these to your doctor or health care professional if they continue or are bothersome): headache joint pain nausea pain, redness, irritation or swelling at the injection site stomach upset This list may not describe all possible side effects. Call your doctor for medical advice about side effects. You may report side effects to FDA at 1-800-FDA-1088. Where should I keep my medication? Keep out of the reach of children. Store the pens in a refrigerator between 2 and 8 degrees C (36 and 46 degrees F). Do not freeze. Use the pen quickly after taking out of the refrigerator and recap and return to refrigerator right after using. Protect from light. Throw away any unused medicine 28 days after the first injection from the pen. Throw away any unopened, unused medicine after the expiration date on the label. NOTE: This sheet is a summary. It may not cover all possible information. If you have questions about this medicine, talk to your doctor, pharmacist, or health care provider.  2023 Elsevier/Gold Standard (2021-03-15 00:00:00)  Zoledronic Acid Injection (Bone Disorders) What is this medication? ZOLEDRONIC ACID (ZOE le dron ik AS id) prevents and treats osteoporosis. It may also be used to treat Paget's disease of the bone. It works by Paramedic stronger and less likely to break (fracture). It belongs to a group of medications called bisphosphonates. This medicine may be used for other purposes; ask your health care provider or pharmacist if you have questions. COMMON BRAND NAME(S): Reclast What should I tell my care team before I take this medication? They need to know if you have any of these conditions: Bleeding disorder Cancer Dental disease Kidney disease Low levels of calcium in the blood Low red blood cell counts Lung or breathing disease, such as asthma Receiving steroids, such as dexamethasone or prednisone An unusual or  allergic reaction to zoledronic acid, other medications, foods, dyes, or preservatives Pregnant or trying to get pregnant Breast-feeding How should I use this medication? This medication is injected into a vein. It is given by your care team in a hospital or clinic setting. A special MedGuide will be given to you before each treatment. Be sure to read this information carefully each time. Talk to your care team about the use of this medication in children. Special care may be needed. Overdosage: If you think you have taken too much of this medicine contact a poison control center or emergency room at once. NOTE: This medicine is only for you. Do not share this medicine with others. What if I miss a dose? Keep appointments for follow-up doses. It is important not to miss your dose. Call your care team if you are unable to keep an appointment. What may interact with this medication? Certain antibiotics given by injection Medications for pain and inflammation, such as ibuprofen, naproxen, NSAIDs Some diuretics, such as bumetanide, furosemide Teriparatide This list may not describe all possible interactions. Give your health care provider a list of all the medicines, herbs, non-prescription drugs, or dietary supplements you use. Also tell them if you smoke, drink alcohol, or use illegal drugs. Some  items may interact with your medicine. What should I watch for while using this medication? Visit your care team for regular checks on your progress. It may be some time before you see the benefit from this medication. Some people who take this medication have severe bone, joint, or muscle pain. This medication may also increase your risk for jaw problems or a broken thigh bone. Tell your care team right away if you have severe pain in your jaw, bones, joints, or muscles. Tell your care team if you have any pain that does not go away or that gets worse. You should make sure you get enough calcium and  vitamin D while you are taking this medication. Discuss the foods you eat and the vitamins you take with your care team. You may need bloodwork while taking this medication. Tell your dentist and dental surgeon that you are taking this medication. You should not have major dental surgery while on this medication. See your dentist to have a dental exam and fix any dental problems before starting this medication. Take good care of your teeth while on this medication. Make sure you see your dentist for regular follow-up appointments. What side effects may I notice from receiving this medication? Side effects that you should report to your care team as soon as possible: Allergic reactions--skin rash, itching, hives, swelling of the face, lips, tongue, or throat Kidney injury--decrease in the amount of urine, swelling of the ankles, hands, or feet Low calcium level--muscle pain or cramps, confusion, tingling, or numbness in the hands or feet Osteonecrosis of the jaw--pain, swelling, or redness in the mouth, numbness of the jaw, poor healing after dental work, unusual discharge from the mouth, visible bones in the mouth Severe bone, joint, or muscle pain Side effects that usually do not require medical attention (report to your care team if they continue or are bothersome): Diarrhea Dizziness Headache Nausea Stomach pain Vomiting This list may not describe all possible side effects. Call your doctor for medical advice about side effects. You may report side effects to FDA at 1-800-FDA-1088. Where should I keep my medication? This medication is given in a hospital or clinic. It will not be stored at home. NOTE: This sheet is a summary. It may not cover all possible information. If you have questions about this medicine, talk to your doctor, pharmacist, or health care provider.  2023 Elsevier/Gold Standard (2021-05-31 00:00:00)

## 2021-09-25 NOTE — Telephone Encounter (Addendum)
Please start Forteo BIV.  Dose: 50mg daily (1 pen per 28 days)  Dx: M81.0 (age-related osteoporosis)  DXA 03/20/21: BMD LFN 0.672 with T-score -2.6 lumbar spine surgery on Sep 18, 2021.  Forteo will be ideal drug for her due to recent surgery and will help with the healing process  DKnox Saliva PharmD, MPH, BCPS, CPP Clinical Pharmacist (Rheumatology and Pulmonology)  ----- Message from [place] at 09/25/2021  3:16 PM EDT ----- Please apply for forteo, per Dr. DEstanislado Pandy Thanks!

## 2021-09-26 MED ORDER — FORTEO 600 MCG/2.4ML ~~LOC~~ SOPN: 20.0000 ug | PEN_INJECTOR | Freq: Every day | SUBCUTANEOUS | 4 refills | Status: DC

## 2021-09-26 MED ORDER — INSULIN PEN NEEDLE 31G X 5 MM MISC: 2 refills | Status: DC

## 2021-09-26 NOTE — Telephone Encounter (Signed)
Pa submitted- Key: [id number] - PA Case ID: [id number]

## 2021-09-26 NOTE — Telephone Encounter (Signed)
Initiated a Prior Authorization request to [place] for [place] via CoverMyMeds.   Awaiting clinical questions.   Key: [id number]

## 2021-09-26 NOTE — Telephone Encounter (Signed)
Received notification from [facility] regarding a prior authorization for [place]. Authorization has been APPROVED from 09/26/21 to 09/27/22.   Patient must fill through [facility]: [phone]  Authorization # Key: [id number] - PA Case ID: [id number]   Copay card details:  [id number], PCN- [id number], [place], ID- [id number]    Phone# [phone]

## 2021-09-26 NOTE — Telephone Encounter (Signed)
Spoke with patient regarding Forteo injection aproval.  She currently self-administered insulin and feels comfortable with injection. I advised that she should schedule shipment and once received, read instructions for use carefully. If she feels uncomfortable with injecting, we can set up appointment to get her started.  Reviewed storage, administration sites, and pen needle use.  Provided her with pharmacy phone number and copay card information.  I asked if she could call our clinic or send MyChart message once she starts so we can document.  Knox Saliva, PharmD, MPH, BCPS, CPP Clinical Pharmacist (Rheumatology and Pulmonology)

## 2021-09-30 ENCOUNTER — Telehealth: Payer: Self-pay | Admitting: Cardiology

## 2021-09-30 NOTE — Telephone Encounter (Signed)
Patient reported that she had been having chest tightness since last Saturday. She was unsure if it was angina or indigestion because the chest tightness comes and goes.  She has been taking Rolaids all weekend and she has also started taking nitro. On Saturday when she was having chest tightness she took 1 nitro and it relieved the chest tightness. On Sunday she had 6 episodes of chest tightness and had to take nitro for relief. Today she was already having chest tightness and shortness of breath and she had to take 1 nitro which had relieved the chest pain at the time of my call to her. Based on these symptoms I recommended that the patient go to the ER for further evaluation. The patient agreed and stated that she would go to the ER.

## 2021-09-30 NOTE — Telephone Encounter (Signed)
error 

## 2021-09-30 NOTE — Telephone Encounter (Signed)
Pt c/o of Chest Pain: STAT if CP now or developed within 24 hours  1. Are you having CP right now? No   2. Are you experiencing any other symptoms (ex. SOB, nausea, vomiting, sweating)? No other symptoms  3. How long have you been experiencing CP? Patient had angina since Saturday.  She not sure it's angina or indigestion.   4. Is your CP continuous or coming and going? It comes and going   5. Have you taken Nitroglycerin? Yes. She states she has been taking Rolaids as well all weekend.

## 2021-10-04 NOTE — Telephone Encounter (Signed)
Called patient to determine status of Forteo rx.  Left VM for patient requesting return call or MyChart message  Knox Saliva, PharmD, MPH, BCPS, CPP Clinical Pharmacist (Rheumatology and Pulmonology)

## 2021-10-07 ENCOUNTER — Telehealth: Payer: Self-pay | Admitting: Rheumatology

## 2021-10-07 NOTE — Telephone Encounter (Signed)
Called patient to follow up on side effects she was experiencing after starting Forteo. She states she started taking calcium and vitamin D supplement (sounded like Caltrate) on Friday 09/27/21 and Forteo on Monday 09/30/21. She states chest pain started on Saturday 09/28/21 and hives started on Sunday 09/29/21. Every day since she has had chest pain and hives and has been taking benadryl and nitroglycerin. She stopped the supplements on Monday 6/5 and was advised to go to the ED on this day, she went to [facility] in [place]. There she was given steroids, her D-Dimer was slightly elevated which they believed to be due to a recent surgery, they also did a CT which revealed no clot, she was not d/c on any medications. She reports hives being "everywhere" including arms, legs, buttocks and they have not gone away. When she has the hives she states that she feels like she is choking and has labored breathing. Patient took pictures and was advised to send them via [place].Patient has not missed any doses of Forteo since starting on 6/5. Patient has not taken Forteo today and was advised to hold medication for 1-2 weeks to see if hives and chest pain clears up. Patient was advised she can continue to take benadryl, hydrocortisone cream, or nitroglycerin as needed for symptom management but to let us know if it gets any worse.  Rodolph Bong, PharmD Candidate 10/07/2021 10:05 AM

## 2021-10-07 NOTE — Telephone Encounter (Signed)
Patient called stating she began taking the Forteo medication last Monday, 09/30/21 and began having side effects almost immediately.  Patient states she experienced chest pain with tightness, as well as hives.  Patient states the symptoms were so bad that she thought she was having a heart attack and went to the ER.  Patient requested a return call.

## 2021-10-08 ENCOUNTER — Telehealth: Payer: Self-pay | Admitting: Cardiology

## 2021-10-08 ENCOUNTER — Ambulatory Visit (INDEPENDENT_AMBULATORY_CARE_PROVIDER_SITE_OTHER): Payer: BC Managed Care – PPO | Admitting: Cardiology

## 2021-10-08 VITALS — BP 108/60 | HR 68 | Ht 62.0 in | Wt 146.0 lb

## 2021-10-08 DIAGNOSIS — E088 Diabetes mellitus due to underlying condition with unspecified complications: Secondary | ICD-10-CM

## 2021-10-08 DIAGNOSIS — F17210 Nicotine dependence, cigarettes, uncomplicated: Secondary | ICD-10-CM

## 2021-10-08 DIAGNOSIS — E782 Mixed hyperlipidemia: Secondary | ICD-10-CM

## 2021-10-08 DIAGNOSIS — I25118 Atherosclerotic heart disease of native coronary artery with other forms of angina pectoris: Secondary | ICD-10-CM | POA: Diagnosis not present

## 2021-10-08 DIAGNOSIS — R079 Chest pain, unspecified: Secondary | ICD-10-CM | POA: Diagnosis not present

## 2021-10-08 NOTE — Patient Instructions (Signed)
Medication Instructions:  Your physician recommends that you continue on your current medications as directed. Please refer to the Current Medication list given to you today.  *If you need a refill on your cardiac medications before your next appointment, please call your pharmacy*   Lab Work: None ordered If you have labs (blood work) drawn today and your tests are completely normal, you will receive your results only by: [place] (if you have MyChart) OR A paper copy in the mail If you have any lab test that is abnormal or we need to change your treatment, we will call you to review the results.   Testing/Procedures: None ordered   Follow-Up: At [facility], you and your health needs are our priority.  As part of our continuing mission to provide you with exceptional heart care, we have created designated Provider Care Teams.  These Care Teams include your primary Cardiologist (physician) and Advanced Practice Providers (APPs -  Physician Assistants and Nurse Practitioners) who all work together to provide you with the care you need, when you need it.  We recommend signing up for the patient portal called "MyChart".  Sign up information is provided on this After Visit Summary.  MyChart is used to connect with patients for Virtual Visits (Telemedicine).  Patients are able to view lab/test results, encounter notes, upcoming appointments, etc.  Non-urgent messages can be sent to your provider as well.   To learn more about what you can do with MyChart, go to [email or web address].    Your next appointment:   Keep scheduled appointment.  The format for your next appointment:   In Person  Provider:   Jenne Campus, MD   Other Instructions NA

## 2021-10-08 NOTE — Progress Notes (Signed)
Cardiology Office Note:    Date:  10/08/2021   ID:  [name], DOB 23-Oct-1960, MRN [id number]  PCP:  [name], MD  Cardiologist:  [name], MD   Referring MD: [name], MD    ASSESSMENT:    1. Coronary artery disease of native artery of native heart with stable angina pectoris (HCC)   2. Chest pain of uncertain etiology   3. Mixed hyperlipidemia   4. Cigarette smoker   5. Diabetes mellitus due to underlying condition with unspecified complications ([place])    PLAN:    In order of problems listed above:  Coronary artery disease: Secondary prevention stressed with the patient.  Importance of compliance with diet medication stressed and she vocalized understanding.  Her chest pain is atypical for coronary etiology.  I reviewed recent coronary angiography report with her at length.  I suggested stress testing but she is not keen.  Nitroglycerin protocol is known to her. Essential hypertension: Blood pressure stable and diet was emphasized.   Mixed dyslipidemia: Lipids were reviewed from the past.  They have not been done recently and she promises to get blood work done prior primary care.  She will send me a copy.  Diet emphasized. Diabetes mellitus: Managed by primary care.  Hemoglobin A1c discussed with the patient.  It was elevated. Cigarette smoker: I spent 5 minutes with the patient discussing solely about smoking. Smoking cessation was counseled. I suggested to the patient also different medications and pharmacological interventions. Patient is keen to try stopping on its own at this time. He will get back to me if he needs any further assistance in this matter. Patient will be seen in follow-up appointment in 6 months or earlier if the patient has any concerns    Medication Adjustments/Labs and Tests Ordered: Current medicines are reviewed at length with the patient today.  Concerns regarding medicines are outlined above.  Orders Placed This Encounter   Procedures   EKG 12-Lead   No orders of the defined types were placed in this encounter.    No chief complaint on file.    History of Present Illness:    [name] is a 61 y.o. female.  Patient has past medical history of coronary artery disease, essential hypertension, dyslipidemia and diabetes mellitus and unfortunately continues to smoke.  She complains of chest tightness at times and is not related to exertion.  Nitroglycerin does not relieve the pain.  No orthopnea or PND.  He does not come with stress.  She mentions to me that she had a tick bite and a rash and plans to see her doctor in the next day or 2.  At the time of my evaluation, the patient is alert awake oriented and in no distress.  Past Medical History:  Diagnosis Date   Abnormal thyroid function test 12/06/2015   Accelerating angina ([place]) 05/06/2021   Acute bilateral thoracic back pain 12/03/2018   Acute left ankle pain 03/09/2020   Alcohol use 04/15/2019   Formatting of this note might be different from the original. Recommend abstinence from alcohol   Anxiety disorder 06/05/2015   Last Assessment & Plan:  Formatting of this note might be different from the original. Had started her on some xanax about week ago and she feels it has helped her and she is taking about half tablet for this and will continue with it as she has been compliant with it   Arteriosclerotic vascular disease 11/06/2017  Arthritis    back    Atherosclerosis 06/05/2015   Formatting of this note might be different from the original. Seen on CTs of chest.   Atherosclerosis of native coronary artery of native heart without angina pectoris 02/11/2016   Attention deficit 12/04/2017   Blurred vision 07/11/2020   BMI 27.0-27.9,adult 07/11/2015   Last Assessment & Plan:  Formatting of this note might be different from the original. Relevant Hx: Course: Daily Update: Today's Plan:difficult for her with the insulin as well as her abilify and the  weight is increaseing for her rather than decreasing, she has cut her alcohol intake back, she has worked on her diet more with decreasing her carb intake as well and still difficulty discussed with    Bursitis of both hips 04/14/2018   CAD (coronary artery disease) 12/24/2017   Chest pain 11/06/2017   Chronic bronchitis ([place]) 12/04/2015   Formatting of this note might be different from the original. PFT ratio 88% FEV1 103% FVC 94% DLCO 76%  Last Assessment & Plan:  Formatting of this note might be different from the original. We discussed this as she has been coughing again for about 2 mnths and she sounds congested on exam with previous ease for pneumonia will place her on levaquin and have reviewed with her the importance of her    Cigarette smoker 02/26/2016   Complex regional pain syndrome of lower limb 03/07/2021   Coronary arteriosclerosis 12/24/2017   Coronary artery disease of native artery of native heart with stable angina pectoris ([place]) 01/25/2016   Formatting of this note might be different from the original. 2017:  50% LAD lesion.  Calcium noted other places.  Heart cath December 31, 2017 with a 30% ostial LAD lesion otherwise normal coronary arteries seen with LVEF 55 to 65%   Deficiency of other specified B group vitamins 11/07/2016   Degeneration of lumbar intervertebral disc 11/06/2017   Diabetes mellitus due to underlying condition with unspecified complications ([employer]) 08/65/7846   Diabetes mellitus type 1, uncomplicated ([place]) 96/29/5284   Added automatically from request for surgery [id number]  Formatting of this note might be different from the original. Added automatically from request for surgery [id number]   Diabetes mellitus without complication ([place])    Type 1- injections only- recent change to [place] with improved A1C reading.   Dysphagia 11/04/2017   Dyspnea on exertion 12/04/2017   Elevated alkaline phosphatase level 11/09/2020   Elevated LFTs 03/10/2019   Formatting of this  note might be different from the original. History.  Cannot change to this.   Elevated parathyroid hormone 06/06/2021   Erythrocytosis 12/04/2017   Facial tingling 07/11/2020   Fatigue 11/06/2017   Fever 36/64/4034   Folic acid deficiency 74/25/9563   Fusion of lumbar spine 01/11/2020   HAP (hospital-acquired pneumonia) 01/13/2020   Hemangioma 11/06/2017   History of metabolic disorder 87/56/4332   History of pneumonia 01/25/2020   Formatting of this note might be different from the original. Recent mutifocal pneumonia September 2021 Formatting of this note might be different from the original. Formatting of this note might be different from the original. Recent mutifocal pneumonia September 2021   History of sepsis 01/25/2020   Formatting of this note might be different from the original. Recent septic syndrome with multifocal pneumonia Formatting of this note might be different from the original. Formatting of this note might be different from the original. Recent septic syndrome with multifocal pneumonia   HNP (herniated nucleus pulposus), lumbar 05/10/2014  Hypotension 07/18/2020   Idiopathic peripheral neuropathy 08/29/2020   Insulin long-term use ([place]) 11/06/2017   Intermittent claudication ([place]) 12/04/2017   Irritable bowel syndrome 09/03/2020   Left foot pain 01/28/2018   Lipoma of abdominal wall 08/08/2020   Lipoma of left upper extremity 09/04/2020   Low back pain 10/22/2019   Lumbar post-laminectomy syndrome 03/04/2019   Lumbar radiculopathy 05/26/2017   Lung mass 12/04/2017   Malaise and fatigue 11/06/2017   Menopausal disorder 11/06/2017   Mixed hyperlipidemia 04/09/2016   Formatting of this note might be different from the original. Added automatically from request for surgery [id number]   Moderate chronic obstructive pulmonary disease ([place]) 11/06/2017   Multiple actinic keratoses 11/06/2017   Myositis 12/04/2017   Nicotine dependence, cigarettes, uncomplicated 77/82/4235    Obsessive-compulsive disorder 10/14/2019   Formatting of this note might be different from the original. Formerly followed with [facility].   Osteoporosis of multiple sites without pathological fracture 06/06/2021   Overweight with body mass index (BMI) 25.0-29.9 07/11/2015   Last Assessment & Plan:  Relevant Hx: Course: Daily Update: Today's Plan:difficult for her with the insulin as well as her abilify and the weight is increaseing for her rather than decreasing, she has cut her alcohol intake back, she has worked on her diet more with decreasing her carb intake as well and still difficulty discussed with the saxenda and will try to get this approved for her and see    Pain in both lower extremities 07/10/2015   Last Assessment & Plan:  Formatting of this note might be different from the original. Relevant Hx: Course: Daily Update: Today's Plan:she had pain to her right anterior thigh there is edema of both legs and she has fear for DVT which she is going to have [place] of and confirm ok, she has increased her weight which is part of her edema I believe as well as her insulin usage. She is advised as well tha   Pain of left hip joint 07/23/2018   Pelvic pain 12/14/2020   Personal history of (healed) traumatic fracture 12/31/2016   Pneumonia    Polyarthralgia 06/11/2018   Primary insomnia 06/05/2015   Primary osteoarthritis involving multiple joints 08/24/2019   Pure hypercholesterolemia 02/11/2016   Purpura ([place]) 11/06/2017   Respiratory failure ([place]) 11/2018   RUQ pain 11/09/2020   S/P lumbar laminectomy 09/18/2021   Sciatica 11/03/2017   Scoliosis deformity of spine 11/03/2017   Secondary diabetes mellitus ([place]) 12/24/2017   Senile purpura ([place]) 12/04/2017   Sepsis ([place]) 07/18/2020   Smoking greater than 30 pack years 02/26/2016   Tobacco abuse 02/11/2016   Tobacco user 02/11/2016   Trichotillomania    hx.    Trigger thumb of right hand 36/14/4315   Uncomplicated type 1 diabetes  mellitus ([place]) 10/22/2020    Past Surgical History:  Procedure Laterality Date   ABDOMINAL EXPOSURE N/A 01/11/2020   Procedure: ABDOMINAL EXPOSURE;  Surgeon: Marty Heck, MD;  Location: [facility] OR;  Service: Vascular;  Laterality: N/A;   ANTERIOR LUMBAR FUSION N/A 01/11/2020   Procedure: ANTERIOR LUMBAR INTERBODY FUSION  LUMBAR FIVE-SACRAL ONE, LEFT LUMBAR TWO-THREE FORAMINOTOMY, DISECTOMY;  Surgeon: Melina Schools, MD;  Location: [facility];  Service: Orthopedics;  Laterality: N/A;   APPENDECTOMY  04/28/1970   BACK SURGERY  2016   CARDIAC CATHETERIZATION  12/31/2017   COLONOSCOPY  04/28/2017   EXCISION/RELEASE BURSA HIP Left 04/14/2018   Procedure: Excision trochanteric bursa left hip;  Surgeon: Susa Day, MD;  Location: [facility] ORS;  Service: Orthopedics;  Laterality: Left;  60 mins   HAND SURGERY     HIP OPEN REDUCTION Right    ORIF   INTRAVASCULAR PRESSURE WIRE/FFR STUDY N/A 05/06/2021   Procedure: INTRAVASCULAR PRESSURE WIRE/FFR STUDY;  Surgeon: Nelva Bush, MD;  Location: [facility];  Service: Cardiovascular;  Laterality: N/A;   KNEE ARTHROSCOPY     LEFT HEART CATH AND CORONARY ANGIOGRAPHY N/A 12/31/2017   Procedure: LEFT HEART CATH AND CORONARY ANGIOGRAPHY;  Surgeon: Martinique, Peter M, MD;  Location: [facility];  Service: Cardiovascular;  Laterality: N/A;   LEFT HEART CATH AND CORONARY ANGIOGRAPHY N/A 05/06/2021   Procedure: LEFT HEART CATH AND CORONARY ANGIOGRAPHY;  Surgeon: Nelva Bush, MD;  Location: [facility];  Service: Cardiovascular;  Laterality: N/A;   LUMBAR LAMINECTOMY/DECOMPRESSION MICRODISCECTOMY Left 05/10/2014   Procedure: MICRODISCECTOMY LUMBAR DECOMPRESSION L5-S1 LEFT ;  Surgeon: Johnn Hai, MD;  Location: [facility];  Service: Orthopedics;  Laterality: Left;   LUMBAR LAMINECTOMY/DECOMPRESSION MICRODISCECTOMY N/A 01/11/2020   Procedure: LUMBAR LAMINECTOMY/DECOMPRESSION MICRODISCECTOMY LEFT LUMBAR TWO-THREE;  Surgeon: Melina Schools, MD;   Location: [facility];  Service: Orthopedics;  Laterality: N/A;   LUMBAR LAMINECTOMY/DECOMPRESSION MICRODISCECTOMY Right 09/18/2021   Procedure: Laminectomy and Foraminotomy - Lumbar Four-Lumbar Five - right;  Surgeon: Eustace Moore, MD;  Location: [facility];  Service: Neurosurgery;  Laterality: Right;  Laminectomy and Foraminotomy - Lumbar Four-Lumbar Five - right   ORIF WRIST FRACTURE Right    retained hardware   ORTHOPAEDIC SURGERY  04/14/2018   TUBAL LIGATION  12/25/1995    Current Medications: Current Meds  Medication Sig   acetaminophen (TYLENOL) 500 MG tablet Take 500 mg by mouth daily as needed for mild pain.   ALPRAZolam (XANAX) 0.5 MG tablet Take 0.5 mg by mouth at bedtime.    atorvastatin (LIPITOR) 80 MG tablet Take 80 mg by mouth every evening.   cyanocobalamin (,VITAMIN B-12,) 1000 MCG/ML injection Inject 1,000 mcg into the muscle every 14 (fourteen) days.   finasteride (PROSCAR) 5 MG tablet Take 2.5 mg by mouth every evening.   FLUoxetine (PROZAC) 40 MG capsule Take 80 mg by mouth every evening.   folic acid (FOLVITE) 1 MG tablet Take 1 mg by mouth at bedtime.    furosemide (LASIX) 40 MG tablet Take 20-40 mg by mouth daily as needed for fluid.   insulin aspart (NOVOLOG FLEXPEN) 100 UNIT/ML FlexPen Inject 0-10 Units into the skin 3 (three) times daily with meals. Per sliding scale   insulin degludec (TRESIBA FLEXTOUCH) 200 UNIT/ML FlexTouch Pen Inject 32 Units into the skin in the morning.   Insulin Pen Needle 31G X 5 MM MISC Use to inject Forteo once daily   linaclotide (LINZESS) 290 MCG CAPS capsule Take 290 mcg by mouth at bedtime.    metoprolol tartrate (LOPRESSOR) 25 MG tablet Take 1 tablet (25 mg total) by mouth 2 (two) times daily.   nitroGLYCERIN (NITROSTAT) 0.4 MG SL tablet Place 1 tablet (0.4 mg total) under the tongue every 5 (five) minutes as needed for chest pain.   oxyCODONE-acetaminophen (PERCOCET) 5-325 MG tablet Take 1 tablet by mouth every 4 (four) hours as needed for  severe pain.   pantoprazole (PROTONIX) 40 MG tablet Take 40 mg by mouth at bedtime.   Semaglutide,0.25 or 0.'5MG'$ /DOS, (OZEMPIC, 0.25 OR 0.5 MG/DOSE,) 2 MG/3ML SOPN Inject 0.5 mg into the skin every Wednesday.   traMADol (ULTRAM) 50 MG tablet Take 50 mg by mouth daily as needed for moderate pain.  Allergies:   Celebrex [celecoxib] and Mobic [meloxicam]   Social History   Socioeconomic History   Marital status: Divorced    Spouse name: Not on file   Number of children: Not on file   Years of education: Not on file   Highest education level: Not on file  Occupational History   Not on file  Tobacco Use   Smoking status: Every Day    Packs/day: 0.50    Years: 42.00    Total pack years: 21.00    Types: Cigarettes    Passive exposure: Current   Smokeless tobacco: Never  Vaping Use   Vaping Use: Never used  Substance and Sexual Activity   Alcohol use: Yes    Alcohol/week: 6.0 standard drinks of alcohol    Types: 6 Cans of beer per week    Comment: weekends - social   Drug use: No   Sexual activity: Not Currently  Other Topics Concern   Not on file  Social History Narrative   Not on file   Social Determinants of Health   Financial Resource Strain: Not on file  Food Insecurity: Not on file  Transportation Needs: Not on file  Physical Activity: Not on file  Stress: Not on file  Social Connections: Not on file     Family History: The patient's family history includes Cancer in her mother; Diabetes in her father, mother, and son; Healthy in her brother, daughter, and son; Heart disease in her father; Hypertension in her father.  ROS:   Please see the history of present illness.    All other systems reviewed and are negative.  EKGs/Labs/Other Studies Reviewed:    The following studies were reviewed today: EKG reveals sinus rhythm and nonspecific ST-T changes   Recent Labs: 06/04/2021: TSH 0.64 09/11/2021: BUN 9; Creatinine, Ser 0.64; Hemoglobin 14.0; Platelets 219;  Potassium 4.2; Sodium 138  Recent Lipid Panel    Component Value Date/Time   CHOL 186 10/11/2019 0950   TRIG 136 10/11/2019 0950   HDL 49 10/11/2019 0950   CHOLHDL 3.8 10/11/2019 0950   LDLCALC 113 (H) 10/11/2019 0950    Physical Exam:    VS:  BP 108/60   Pulse 68   Ht '5\' 2"'$  (1.575 m)   Wt 146 lb (66.2 kg)   LMP 05/04/2008   SpO2 97%   BMI 26.70 kg/m     Wt Readings from Last 3 Encounters:  10/08/21 146 lb (66.2 kg)  09/25/21 147 lb (66.7 kg)  09/18/21 147 lb (66.7 kg)     GEN: Patient is in no acute distress HEENT: Normal NECK: No JVD; No carotid bruits LYMPHATICS: No lymphadenopathy CARDIAC: Hear sounds regular, 2/6 systolic murmur at the apex. RESPIRATORY:  Clear to auscultation without rales, wheezing or rhonchi  ABDOMEN: Soft, non-tender, non-distended MUSCULOSKELETAL:  No edema; No deformity  SKIN: Warm and dry NEUROLOGIC:  Alert and oriented x 3 PSYCHIATRIC:  Normal affect   Signed, [name], MD  10/08/2021 1:12 PM    [facility]

## 2021-10-08 NOTE — Telephone Encounter (Signed)
Appointment made with Dr. Geraldo Pitter today.

## 2021-10-08 NOTE — Telephone Encounter (Signed)
Pt c/o of Chest Pain: STAT if CP now or developed within 24 hours  1. Are you having CP right now? No  2. Are you experiencing any other symptoms (ex. SOB, nausea, vomiting, sweating)? NO  3. How long have you been experiencing CP? 2 weeks  4. Is your CP continuous or coming and going? Come and go  5. Have you taken Nitroglycerin? Yes   Pt states she feels pressure in her upper chest. Pt would like to be seen sooner than scheduled appt on 01/14/22 if possible. Pt states that this goes on everyday. Please advise ?

## 2021-10-15 ENCOUNTER — Other Ambulatory Visit: Payer: Self-pay

## 2021-10-15 MED ORDER — METOPROLOL TARTRATE 25 MG PO TABS: 25.0000 mg | ORAL_TABLET | Freq: Two times a day (BID) | ORAL | 3 refills | Status: DC

## 2021-10-16 NOTE — Telephone Encounter (Signed)
Called patient to follow up on last encounter regarding chest pain and hives near starting Forteo and a calcium and vitamin D supplement. Patient stated that the chest pain and hives started before she started taking the Forteo and after starting the calcium and vitamin D supplement. She has been holding the calcium supplement and the Forteo since Monday. She states she was still having some chest pain and hives recently, but does not have any today. Patient has been taking Pepcid and Zyrtec to help with the symptoms. Patient was already thinking about restarting the Forteo since her symptoms started before she started taking it. Advised the patient that she can restart the Forteo and patient is in agreement. Also discussed the important of a calcium and vitamin D supplement daily for patients with osteoporosis. Patient had already returned the supplement to the health store she purchased it from and did not remember the name. Recommended switching to a different calcium and vitamin D supplement such as Caltrate.   Rodolph Bong, PharmD Candidate 10/16/2021 9:06 AM

## 2021-10-18 DIAGNOSIS — L503 Dermatographic urticaria: Secondary | ICD-10-CM | POA: Insufficient documentation

## 2021-10-22 ENCOUNTER — Encounter: Payer: Self-pay | Admitting: Allergy

## 2021-10-22 ENCOUNTER — Ambulatory Visit (INDEPENDENT_AMBULATORY_CARE_PROVIDER_SITE_OTHER): Payer: BC Managed Care – PPO | Admitting: Allergy

## 2021-10-22 VITALS — BP 112/62 | HR 70 | Resp 16 | Ht 62.0 in | Wt 152.8 lb

## 2021-10-22 DIAGNOSIS — L503 Dermatographic urticaria: Secondary | ICD-10-CM

## 2021-10-22 DIAGNOSIS — L509 Urticaria, unspecified: Secondary | ICD-10-CM

## 2021-10-24 ENCOUNTER — Telehealth: Payer: Self-pay | Admitting: Allergy

## 2021-10-24 NOTE — Telephone Encounter (Signed)
Patient states she can see her lab results through my chart and would like to know the results. I did inform patient we had not received all lab work and Dr. Nelva Bush has not resulted them yet.

## 2021-10-26 ENCOUNTER — Encounter: Payer: Self-pay | Admitting: Allergy

## 2021-10-30 NOTE — Telephone Encounter (Signed)
Patient called the office again and would like to know if Dr. Nelva Bush could go over her lab work. She can see her results on MyChart and has some questions about her low protein and high albumin/globulin.

## 2021-10-30 NOTE — Telephone Encounter (Signed)
Sent MyChart message to patient letting her know that Dr. Nelva Bush is currently out of the office and can go over blood test results when she returns.  I did let her know that one of the other providers did look over her results and said that things could wait to be addressed when Dr. Nelva Bush comes back.

## 2021-10-31 ENCOUNTER — Other Ambulatory Visit: Payer: Self-pay

## 2021-10-31 DIAGNOSIS — M542 Cervicalgia: Secondary | ICD-10-CM

## 2021-11-01 LAB — COMPREHENSIVE METABOLIC PANEL
ALT: 23 IU/L (ref 0–32)
AST: 22 IU/L (ref 0–40)
Albumin/Globulin Ratio: 2.3 — ABNORMAL HIGH (ref 1.2–2.2)
Albumin: 4.1 g/dL (ref 3.8–4.8)
Alkaline Phosphatase: 109 IU/L (ref 44–121)
BUN/Creatinine Ratio: 20 (ref 12–28)
BUN: 13 mg/dL (ref 8–27)
Bilirubin Total: 0.9 mg/dL (ref 0.0–1.2)
CO2: 25 mmol/L (ref 20–29)
Calcium: 9.4 mg/dL (ref 8.7–10.3)
Chloride: 102 mmol/L (ref 96–106)
Creatinine, Ser: 0.64 mg/dL (ref 0.57–1.00)
Globulin, Total: 1.8 g/dL (ref 1.5–4.5)
Glucose: 141 mg/dL — ABNORMAL HIGH (ref 70–99)
Potassium: 4.1 mmol/L (ref 3.5–5.2)
Sodium: 141 mmol/L (ref 134–144)
Total Protein: 5.9 g/dL — ABNORMAL LOW (ref 6.0–8.5)
eGFR: 100 mL/min/{1.73_m2} (ref >59)

## 2021-11-01 LAB — CBC WITH DIFFERENTIAL
Basophils Absolute: 0.1 10*3/uL (ref 0.0–0.2)
Basos: 1 %
EOS (ABSOLUTE): 0.2 10*3/uL (ref 0.0–0.4)
Eos: 3 %
Hematocrit: 39.5 % (ref 34.0–46.6)
Hemoglobin: 13.5 g/dL (ref 11.1–15.9)
Immature Grans (Abs): 0 10*3/uL (ref 0.0–0.1)
Immature Granulocytes: 0 %
Lymphocytes Absolute: 2 10*3/uL (ref 0.7–3.1)
Lymphs: 29 %
MCH: 32.8 pg (ref 26.6–33.0)
MCHC: 34.2 g/dL (ref 31.5–35.7)
MCV: 96 fL (ref 79–97)
Monocytes Absolute: 0.4 10*3/uL (ref 0.1–0.9)
Monocytes: 6 %
Neutrophils Absolute: 4.2 10*3/uL (ref 1.4–7.0)
Neutrophils: 61 %
RBC: 4.11 x10E6/uL (ref 3.77–5.28)
RDW: 12.9 % (ref 11.7–15.4)
WBC: 6.9 10*3/uL (ref 3.4–10.8)

## 2021-11-01 LAB — ALLERGENS W/TOTAL IGE AREA 2

## 2021-11-01 LAB — CHRONIC URTICARIA: cu index: <3 (ref <10)

## 2021-11-01 LAB — THYROID ANTIBODIES
Thyroglobulin Antibody: <1 IU/mL (ref 0.0–0.9)
Thyroperoxidase Ab SerPl-aCnc: <9 IU/mL (ref 0–34)

## 2021-11-01 LAB — TRYPTASE: Tryptase: 2.6 ug/L (ref 2.2–13.2)

## 2021-11-05 ENCOUNTER — Telehealth: Payer: Self-pay | Admitting: Pharmacist

## 2021-11-05 ENCOUNTER — Ambulatory Visit (INDEPENDENT_AMBULATORY_CARE_PROVIDER_SITE_OTHER): Payer: BC Managed Care – PPO

## 2021-11-05 DIAGNOSIS — M542 Cervicalgia: Secondary | ICD-10-CM

## 2021-11-05 NOTE — Telephone Encounter (Signed)
Patient called stating she started Forteo about 6 weeks ago. She did have an interruption in treatment a few days after starting treatment due to chest pain that was found to be unrelated to [facility].  Patient calling today because she reports bone pain in shoulder and arms. I did review that muscle and bone pain is uncommon but is a reported side effect among all osteoporosis medications. Advised her to again hold Forteo x 1 week and see if this pain resolved.  Before ending call, patient mentioned that she believes it is likely rotator cuff inflammation or injury which she states has occurred before and pain was similar. She will f/u with update.  Knox Saliva, PharmD, MPH, BCPS, CPP Clinical Pharmacist (Rheumatology and Pulmonology)

## 2021-11-08 ENCOUNTER — Telehealth: Payer: Self-pay | Admitting: Rheumatology

## 2021-11-08 NOTE — Telephone Encounter (Signed)
Patient advised it is okay to get cortisone injection while she is on Forteo.

## 2021-11-08 NOTE — Telephone Encounter (Signed)
Patient called the office requesting to speak with someone regarding a medication. Patient states she is getting a cortisone injection in her arm and wants to make sure that is ok given she is on Forteo. Please advise.

## 2021-11-08 NOTE — Telephone Encounter (Signed)
It does okay to get cortisone injection while she is on Forteo.

## 2021-11-27 ENCOUNTER — Ambulatory Visit: Payer: BC Managed Care – PPO | Admitting: Rheumatology

## 2021-11-27 ENCOUNTER — Encounter: Payer: Self-pay | Admitting: Physician Assistant

## 2021-11-27 ENCOUNTER — Ambulatory Visit: Payer: BC Managed Care – PPO | Attending: Physician Assistant | Admitting: Physician Assistant

## 2021-11-27 VITALS — BP 106/68 | HR 67 | Resp 13 | Ht 62.0 in | Wt 147.0 lb

## 2021-11-27 DIAGNOSIS — E109 Type 1 diabetes mellitus without complications: Secondary | ICD-10-CM

## 2021-11-27 DIAGNOSIS — I7090 Unspecified atherosclerosis: Secondary | ICD-10-CM

## 2021-11-27 DIAGNOSIS — M810 Age-related osteoporosis without current pathological fracture: Secondary | ICD-10-CM

## 2021-11-27 DIAGNOSIS — M4326 Fusion of spine, lumbar region: Secondary | ICD-10-CM

## 2021-11-27 DIAGNOSIS — E7800 Pure hypercholesterolemia, unspecified: Secondary | ICD-10-CM

## 2021-11-27 DIAGNOSIS — M5136 Other intervertebral disc degeneration, lumbar region: Secondary | ICD-10-CM | POA: Diagnosis not present

## 2021-11-27 DIAGNOSIS — M2550 Pain in unspecified joint: Secondary | ICD-10-CM

## 2021-11-27 DIAGNOSIS — M25561 Pain in right knee: Secondary | ICD-10-CM

## 2021-11-27 DIAGNOSIS — F17210 Nicotine dependence, cigarettes, uncomplicated: Secondary | ICD-10-CM

## 2021-11-27 DIAGNOSIS — M25461 Effusion, right knee: Secondary | ICD-10-CM

## 2021-11-27 DIAGNOSIS — M65312 Trigger thumb, left thumb: Secondary | ICD-10-CM

## 2021-11-27 DIAGNOSIS — M65311 Trigger thumb, right thumb: Secondary | ICD-10-CM

## 2021-11-27 DIAGNOSIS — Z8619 Personal history of other infectious and parasitic diseases: Secondary | ICD-10-CM

## 2021-11-27 DIAGNOSIS — Z8719 Personal history of other diseases of the digestive system: Secondary | ICD-10-CM

## 2021-11-27 DIAGNOSIS — M51369 Other intervertebral disc degeneration, lumbar region without mention of lumbar back pain or lower extremity pain: Secondary | ICD-10-CM

## 2021-11-27 DIAGNOSIS — Z8659 Personal history of other mental and behavioral disorders: Secondary | ICD-10-CM

## 2021-11-27 DIAGNOSIS — I25118 Atherosclerotic heart disease of native coronary artery with other forms of angina pectoris: Secondary | ICD-10-CM

## 2021-11-27 DIAGNOSIS — J449 Chronic obstructive pulmonary disease, unspecified: Secondary | ICD-10-CM

## 2021-11-27 DIAGNOSIS — F633 Trichotillomania: Secondary | ICD-10-CM

## 2021-11-27 DIAGNOSIS — L570 Actinic keratosis: Secondary | ICD-10-CM

## 2021-11-27 NOTE — Progress Notes (Signed)
Office Visit Note  Patient: [name]             Date of Birth: Oct 05, 1960           MRN: [id number]             PCP: [name], MD Referring: [name], MD Visit Date: 11/27/2021 Occupation: '@GUAROCC'$ @  Subjective:  Pain in multiple joints  History of Present Illness: [name] is a 61 y.o. female with history of osteoporosis and DDD.  She is currently on Forteo daily injections for management of osteoporosis. She was started on forteo on 09/30/21.  She presents today with increased pain involving multiple joints.  She has had significant discomfort in both shoulder joints which has been persistent.  She was evaluated by Dr. Onnie Graham and had a left shoulder cortisone injection which provided relief for only about 1 week.  She presents today with increased pain in the right knee which started last week.  She did not have any injury or fall prior to the onset of symptoms.  She has noticed swelling in her right knee and was evaluated yesterday by her PCP.  She had x-ray of the right knee yesterday and brought the results with her today.  She is scheduled for an MRI tomorrow for further evaluations.  She has not been taking any over-the-counter products for symptomatic relief.  She states that overall she has been experiencing increased arthralgias and fatigue over the past several weeks.  She is unsure if her symptoms are a side effect of forteo or something else going on.    Activities of Daily Living:  Patient reports morning stiffness for all day. Patient Reports nocturnal pain.  Difficulty dressing/grooming: Denies Difficulty climbing stairs: Reports Difficulty getting out of chair: Reports Difficulty using hands for taps, buttons, cutlery, and/or writing: Reports  Review of Systems  Constitutional:  Positive for fatigue.  HENT:  Negative for mouth sores and mouth dryness.   Eyes:  Negative for dryness.  Respiratory:  Negative for shortness of breath.    Cardiovascular:  Negative for chest pain and palpitations.  Gastrointestinal:  Negative for blood in stool, constipation and diarrhea.  Endocrine: Negative for increased urination.  Genitourinary:  Negative for involuntary urination.  Musculoskeletal:  Positive for joint pain, joint pain, joint swelling and morning stiffness. Negative for myalgias, muscle weakness, muscle tenderness and myalgias.  Skin:  Negative for color change, rash, hair loss and sensitivity to sunlight.  Allergic/Immunologic: Negative for susceptible to infections.  Neurological:  Positive for headaches. Negative for dizziness.  Hematological:  Negative for swollen glands.  Psychiatric/Behavioral:  Positive for sleep disturbance. Negative for depressed mood. The patient is not nervous/anxious.     PMFS History:  Patient Active Problem List   Diagnosis Date Noted   S/P lumbar laminectomy 09/18/2021   Elevated parathyroid hormone 06/06/2021   Osteoporosis of multiple sites without pathological fracture 06/06/2021   Accelerating angina ([place]) 05/06/2021   Complex regional pain syndrome of lower limb 03/07/2021   Pelvic pain 12/14/2020   RUQ pain 11/09/2020   Elevated alkaline phosphatase level 01/77/9390   Uncomplicated type 1 diabetes mellitus ([place]) 10/22/2020   Lipoma of left upper extremity 09/04/2020   Irritable bowel syndrome 09/03/2020   Idiopathic peripheral neuropathy 08/29/2020   Lipoma of abdominal wall 08/08/2020   Fever 07/18/2020   Hypotension 07/18/2020   Sepsis ([place]) 07/18/2020   Pneumonia 07/18/2020   Arthritis 07/18/2020   Blurred vision 07/11/2020  Facial tingling 07/11/2020   Diabetes mellitus without complication (HCC)    Acute left ankle pain 03/09/2020   History of pneumonia 01/25/2020   History of sepsis 01/25/2020   Respiratory failure ([place]) 01/13/2020   HAP (hospital-acquired pneumonia) 01/13/2020   Fusion of lumbar spine 01/11/2020   Trigger thumb of right hand 11/14/2019   Low  back pain 10/22/2019   Obsessive-compulsive disorder 10/14/2019   Diabetes mellitus due to underlying condition with unspecified complications ([place]) 16/01/9603   Primary osteoarthritis involving multiple joints 08/24/2019   Alcohol use 04/15/2019   Elevated LFTs 03/10/2019   Lumbar post-laminectomy syndrome 03/04/2019   Acute bilateral thoracic back pain 12/03/2018   Pain of left hip joint 07/23/2018   Polyarthralgia 06/11/2018   Bursitis of both hips 04/14/2018   Left foot pain 01/28/2018   CAD (coronary artery disease) 12/24/2017   Secondary diabetes mellitus ([place]) 12/24/2017   Coronary arteriosclerosis 12/24/2017   Dyspnea on exertion 12/04/2017   Attention deficit 12/04/2017   Intermittent claudication ([place]) 12/04/2017   History of metabolic disorder 54/12/8117   Lung mass 12/04/2017   Myositis 12/04/2017   Erythrocytosis 12/04/2017   Senile purpura ([place]) 12/04/2017   Chest pain 11/06/2017   Multiple actinic keratoses 11/06/2017   Degeneration of lumbar intervertebral disc 11/06/2017   Fatigue 11/06/2017   Insulin long-term use ([place]) 11/06/2017   Menopausal disorder 11/06/2017   Purpura ([place]) 11/06/2017   Moderate chronic obstructive pulmonary disease ([place]) 14/78/2956   Folic acid deficiency 21/30/8657   Malaise and fatigue 11/06/2017   Arteriosclerotic vascular disease 11/06/2017   Dysphagia 11/04/2017   Sciatica 11/03/2017   Scoliosis deformity of spine 11/03/2017   Lumbar radiculopathy 05/26/2017   Personal history of (healed) traumatic fracture 12/31/2016   Deficiency of other specified B group vitamins 11/07/2016   Mixed hyperlipidemia 04/09/2016   Smoking greater than 30 pack years 02/26/2016   Cigarette smoker 02/26/2016   Nicotine dependence, cigarettes, uncomplicated 84/69/6295   Diabetes mellitus type 1, uncomplicated ([place]) 28/41/3244   Tobacco abuse 02/11/2016   Tobacco user 02/11/2016   Pure hypercholesterolemia 02/11/2016   Atherosclerosis of native  coronary artery of native heart without angina pectoris 02/11/2016   Coronary artery disease of native artery of native heart with stable angina pectoris ([place]) 01/25/2016   Abnormal thyroid function test 12/06/2015   Chronic bronchitis ([place]) 12/04/2015   Overweight with body mass index (BMI) 25.0-29.9 07/11/2015   BMI 27.0-27.9,adult 07/11/2015   Pain in both lower extremities 07/10/2015   Anxiety disorder 06/05/2015   Atherosclerosis 06/05/2015   Hemangioma 06/05/2015   Primary insomnia 06/05/2015   Trichotillomania 06/05/2015   HNP (herniated nucleus pulposus), lumbar 05/10/2014    Past Medical History:  Diagnosis Date   Abnormal thyroid function test 12/06/2015   Accelerating angina ([place]) 05/06/2021   Acute bilateral thoracic back pain 12/03/2018   Acute left ankle pain 03/09/2020   Alcohol use 04/15/2019   Formatting of this note might be different from the original. Recommend abstinence from alcohol   Anxiety disorder 06/05/2015   Last Assessment & Plan:  Formatting of this note might be different from the original. Had started her on some xanax about week ago and she feels it has helped her and she is taking about half tablet for this and will continue with it as she has been compliant with it   Arteriosclerotic vascular disease 11/06/2017   Arthritis    back    Atherosclerosis 06/05/2015   Formatting of this note might be different from  the original. Seen on CTs of chest.   Atherosclerosis of native coronary artery of native heart without angina pectoris 02/11/2016   Attention deficit 12/04/2017   Blurred vision 07/11/2020   BMI 27.0-27.9,adult 07/11/2015   Last Assessment & Plan:  Formatting of this note might be different from the original. Relevant Hx: Course: Daily Update: Today's Plan:difficult for her with the insulin as well as her abilify and the weight is increaseing for her rather than decreasing, she has cut her alcohol intake back, she has worked on her diet more  with decreasing her carb intake as well and still difficulty discussed with    Bursitis of both hips 04/14/2018   CAD (coronary artery disease) 12/24/2017   Chest pain 11/06/2017   Chronic bronchitis ([place]) 12/04/2015   Formatting of this note might be different from the original. PFT ratio 88% FEV1 103% FVC 94% DLCO 76%  Last Assessment & Plan:  Formatting of this note might be different from the original. We discussed this as she has been coughing again for about 2 mnths and she sounds congested on exam with previous ease for pneumonia will place her on levaquin and have reviewed with her the importance of her    Cigarette smoker 02/26/2016   Complex regional pain syndrome of lower limb 03/07/2021   Coronary arteriosclerosis 12/24/2017   Coronary artery disease of native artery of native heart with stable angina pectoris ([place]) 01/25/2016   Formatting of this note might be different from the original. 2017:  50% LAD lesion.  Calcium noted other places.  Heart cath December 31, 2017 with a 30% ostial LAD lesion otherwise normal coronary arteries seen with LVEF 55 to 65%   Deficiency of other specified B group vitamins 11/07/2016   Degeneration of lumbar intervertebral disc 11/06/2017   Diabetes mellitus due to underlying condition with unspecified complications ([place]) 95/18/8416   Diabetes mellitus type 1, uncomplicated ([place]) 60/63/0160   Added automatically from request for surgery [id number]  Formatting of this note might be different from the original. Added automatically from request for surgery [id number]   Diabetes mellitus without complication ([place])    Type 1- injections only- recent change to Toujeo with improved A1C reading.   Dysphagia 11/04/2017   Dyspnea on exertion 12/04/2017   Elevated alkaline phosphatase level 11/09/2020   Elevated LFTs 03/10/2019   Formatting of this note might be different from the original. History.  Cannot change to this.   Elevated parathyroid hormone 06/06/2021    Erythrocytosis 12/04/2017   Facial tingling 07/11/2020   Fatigue 11/06/2017   Fever 02/54/2706   Folic acid deficiency 23/76/2831   Fusion of lumbar spine 01/11/2020   HAP (hospital-acquired pneumonia) 01/13/2020   Hemangioma 11/06/2017   History of metabolic disorder 51/76/1607   History of pneumonia 01/25/2020   Formatting of this note might be different from the original. Recent mutifocal pneumonia September 2021 Formatting of this note might be different from the original. Formatting of this note might be different from the original. Recent mutifocal pneumonia September 2021   History of sepsis 01/25/2020   Formatting of this note might be different from the original. Recent septic syndrome with multifocal pneumonia Formatting of this note might be different from the original. Formatting of this note might be different from the original. Recent septic syndrome with multifocal pneumonia   HNP (herniated nucleus pulposus), lumbar 05/10/2014   Hypotension 07/18/2020   Idiopathic peripheral neuropathy 08/29/2020   Insulin long-term use ([place]) 11/06/2017   Intermittent claudication (  [place]) 12/04/2017   Irritable bowel syndrome 09/03/2020   Left foot pain 01/28/2018   Lipoma of abdominal wall 08/08/2020   Lipoma of left upper extremity 09/04/2020   Low back pain 10/22/2019   Lumbar post-laminectomy syndrome 03/04/2019   Lumbar radiculopathy 05/26/2017   Lung mass 12/04/2017   Malaise and fatigue 11/06/2017   Menopausal disorder 11/06/2017   Mixed hyperlipidemia 04/09/2016   Formatting of this note might be different from the original. Added automatically from request for surgery [id number]   Moderate chronic obstructive pulmonary disease ([place]) 11/06/2017   Multiple actinic keratoses 11/06/2017   Myositis 12/04/2017   Nicotine dependence, cigarettes, uncomplicated 74/25/9563   Obsessive-compulsive disorder 10/14/2019   Formatting of this note might be different from the original. Formerly  followed with [facility].   Osteoporosis of multiple sites without pathological fracture 06/06/2021   Overweight with body mass index (BMI) 25.0-29.9 07/11/2015   Last Assessment & Plan:  Relevant Hx: Course: Daily Update: Today's Plan:difficult for her with the insulin as well as her abilify and the weight is increaseing for her rather than decreasing, she has cut her alcohol intake back, she has worked on her diet more with decreasing her carb intake as well and still difficulty discussed with the saxenda and will try to get this approved for her and see    Pain in both lower extremities 07/10/2015   Last Assessment & Plan:  Formatting of this note might be different from the original. Relevant Hx: Course: Daily Update: Today's Plan:she had pain to her right anterior thigh there is edema of both legs and she has fear for DVT which she is going to have [place] of and confirm ok, she has increased her weight which is part of her edema I believe as well as her insulin usage. She is advised as well tha   Pain of left hip joint 07/23/2018   Pelvic pain 12/14/2020   Personal history of (healed) traumatic fracture 12/31/2016   Pneumonia    Polyarthralgia 06/11/2018   Primary insomnia 06/05/2015   Primary osteoarthritis involving multiple joints 08/24/2019   Pure hypercholesterolemia 02/11/2016   Purpura ([place]) 11/06/2017   Respiratory failure ([place]) 11/2018   RUQ pain 11/09/2020   S/P lumbar laminectomy 09/18/2021   Sciatica 11/03/2017   Scoliosis deformity of spine 11/03/2017   Secondary diabetes mellitus ([place]) 12/24/2017   Senile purpura ([place]) 12/04/2017   Sepsis ([place]) 07/18/2020   Smoking greater than 30 pack years 02/26/2016   Tobacco abuse 02/11/2016   Tobacco user 02/11/2016   Trichotillomania    hx.    Trigger thumb of right hand 87/56/4332   Uncomplicated type 1 diabetes mellitus ([place]) 10/22/2020    Family History  Problem Relation Age of Onset   Diabetes Mother    Cancer Mother     Hypertension Father    Heart disease Father    Diabetes Father    Healthy Brother    Healthy Son    Diabetes Son    Healthy Daughter    Past Surgical History:  Procedure Laterality Date   ABDOMINAL EXPOSURE N/A 01/11/2020   Procedure: ABDOMINAL EXPOSURE;  Surgeon: Marty Heck, MD;  Location: [facility];  Service: Vascular;  Laterality: N/A;   ANTERIOR LUMBAR FUSION N/A 01/11/2020   Procedure: ANTERIOR LUMBAR INTERBODY FUSION  LUMBAR FIVE-SACRAL ONE, LEFT LUMBAR TWO-THREE FORAMINOTOMY, DISECTOMY;  Surgeon: Melina Schools, MD;  Location: [facility];  Service: Orthopedics;  Laterality: N/A;   APPENDECTOMY  04/28/1970   BACK SURGERY  2016   CARDIAC CATHETERIZATION  12/31/2017   COLONOSCOPY  04/28/2017   EXCISION/RELEASE BURSA HIP Left 04/14/2018   Procedure: Excision trochanteric bursa left hip;  Surgeon: Susa Day, MD;  Location: [facility];  Service: Orthopedics;  Laterality: Left;  60 mins   HAND SURGERY     HIP OPEN REDUCTION Right    ORIF   INTRAVASCULAR PRESSURE WIRE/FFR STUDY N/A 05/06/2021   Procedure: INTRAVASCULAR PRESSURE WIRE/FFR STUDY;  Surgeon: Nelva Bush, MD;  Location: [facility];  Service: Cardiovascular;  Laterality: N/A;   KNEE ARTHROSCOPY     LEFT HEART CATH AND CORONARY ANGIOGRAPHY N/A 12/31/2017   Procedure: LEFT HEART CATH AND CORONARY ANGIOGRAPHY;  Surgeon: Martinique, Peter M, MD;  Location: [facility];  Service: Cardiovascular;  Laterality: N/A;   LEFT HEART CATH AND CORONARY ANGIOGRAPHY N/A 05/06/2021   Procedure: LEFT HEART CATH AND CORONARY ANGIOGRAPHY;  Surgeon: Nelva Bush, MD;  Location: [facility];  Service: Cardiovascular;  Laterality: N/A;   LUMBAR LAMINECTOMY/DECOMPRESSION MICRODISCECTOMY Left 05/10/2014   Procedure: MICRODISCECTOMY LUMBAR DECOMPRESSION L5-S1 LEFT ;  Surgeon: Johnn Hai, MD;  Location: [facility];  Service: Orthopedics;  Laterality: Left;   LUMBAR LAMINECTOMY/DECOMPRESSION MICRODISCECTOMY N/A 01/11/2020    Procedure: LUMBAR LAMINECTOMY/DECOMPRESSION MICRODISCECTOMY LEFT LUMBAR TWO-THREE;  Surgeon: Melina Schools, MD;  Location: [facility];  Service: Orthopedics;  Laterality: N/A;   LUMBAR LAMINECTOMY/DECOMPRESSION MICRODISCECTOMY Right 09/18/2021   Procedure: Laminectomy and Foraminotomy - Lumbar Four-Lumbar Five - right;  Surgeon: Eustace Moore, MD;  Location: [facility];  Service: Neurosurgery;  Laterality: Right;  Laminectomy and Foraminotomy - Lumbar Four-Lumbar Five - right   ORIF WRIST FRACTURE Right    retained hardware   ORTHOPAEDIC SURGERY  04/14/2018   TUBAL LIGATION  12/25/1995   Social History   Social History Narrative   Not on file   Immunization History  Administered Date(s) Administered   Influenza,inj,Quad PF,6+ Mos 01/21/2016, 12/31/2016, 02/09/2018, 01/18/2021   Influenza,inj,quad, With Preservative 12/27/2016   Influenza-Unspecified 01/21/2016, 12/31/2016   Moderna Sars-Covid-2 Vaccination 04/29/2019, 05/30/2019   Pneumococcal Conjugate-13 07/23/2017   Pneumococcal Polysaccharide-23 02/01/2021     Objective: Vital Signs: BP 106/68 (BP Location: Right Arm, Patient Position: Sitting, Cuff Size: Normal)   Pulse 67   Resp 13   Ht '5\' 2"'$  (1.575 m)   Wt 147 lb (66.7 kg) Comment: per patient  LMP 05/04/2008   BMI 26.89 kg/m    Physical Exam Vitals and nursing note reviewed.  Constitutional:      Appearance: She is well-developed.  HENT:     Head: Normocephalic and atraumatic.  Eyes:     Conjunctiva/sclera: Conjunctivae normal.  Cardiovascular:     Rate and Rhythm: Normal rate and regular rhythm.     Heart sounds: Normal heart sounds.  Pulmonary:     Effort: Pulmonary effort is normal.     Breath sounds: Normal breath sounds.  Abdominal:     General: Bowel sounds are normal.     Palpations: Abdomen is soft.  Musculoskeletal:     Cervical back: Normal range of motion.  Skin:    General: Skin is warm and dry.     Capillary Refill: Capillary refill takes less than 2  seconds.  Neurological:     Mental Status: She is alert and oriented to person, place, and time.  Psychiatric:        Behavior: Behavior normal.      Musculoskeletal Exam: C-spine has good ROM.  Painful ROM of both  shoulder joints with tenderness over the left AC joint.  Elbow joints, wrist joints, MCPs, PIPs, and DIPs good ROM with no synovitis.  Complete fist formation bilaterally.  Hip joints have good ROM.  Painful ROM of the right knee with warmth and swelling.  Left knee has good ROM with no warmth or effusion. Ankle joints have good ROM with no tenderness or synovitis.  No tenderness or synovitis of MTP joints.   CDAI Exam: CDAI Score: -- Patient Global: --; Provider Global: -- Swollen: --; Tender: -- Joint Exam 11/27/2021   No joint exam has been documented for this visit   There is currently no information documented on the homunculus. Go to the Rheumatology activity and complete the homunculus joint exam.  Investigation: No additional findings.  Imaging: VAS US CAROTID  Result Date: 11/05/2021 Carotid Arterial Duplex Study Patient Name:  [name]  Date of Exam:   11/05/2021 Medical Rec #: [id number]       Accession #:    [id number] Date of Birth: November 04, 1960       Patient Gender: F Patient Age:   6 years Exam Location:  [employer] Procedure:      VAS US CAROTID Referring Phys: Jenne Campus --------------------------------------------------------------------------------  Indications:   Neck pain [M54.2 (ICD-10-CM)]. Risk Factors:  Diabetes, coronary artery disease. Other Factors: Hypotension, Atherosclerosis. Performing Technologist: Luane School RDCS  Examination Guidelines: A complete evaluation includes B-mode imaging, spectral Doppler, color Doppler, and power Doppler as needed of all accessible portions of each vessel. Bilateral testing is considered an integral part of a complete examination. Limited examinations for reoccurring indications may be performed as noted.   Right Carotid Findings: +----------+--------+--------+--------+------------------+--------+           PSV cm/sEDV cm/sStenosisPlaque DescriptionComments +----------+--------+--------+--------+------------------+--------+ CCA Prox  89      24                                         +----------+--------+--------+--------+------------------+--------+ CCA Distal57      21                                         +----------+--------+--------+--------+------------------+--------+ ICA Prox  58      14                                         +----------+--------+--------+--------+------------------+--------+ ICA Mid   86      20                                         +----------+--------+--------+--------+------------------+--------+ ICA Distal89      37                                         +----------+--------+--------+--------+------------------+--------+ ECA       55      12                                         +----------+--------+--------+--------+------------------+--------+ +----------+--------+-------+----------------+-------------------+  PSV cm/sEDV cmsDescribe        Arm Pressure (mmHG) +----------+--------+-------+----------------+-------------------+ [id number]             Multiphasic, [id number]                 +----------+--------+-------+----------------+-------------------+ +---------+--------+--+--------+--+---------+ VertebralPSV cm/s38EDV cm/s10Antegrade +---------+--------+--+--------+--+---------+  Left Carotid Findings: +----------+--------+--------+--------+------------------+--------+           PSV cm/sEDV cm/sStenosisPlaque DescriptionComments +----------+--------+--------+--------+------------------+--------+ CCA Prox  88      28                                         +----------+--------+--------+--------+------------------+--------+ CCA Distal73      28                                          +----------+--------+--------+--------+------------------+--------+ ICA Prox  80      29                                         +----------+--------+--------+--------+------------------+--------+ ICA Mid   85      34                                         +----------+--------+--------+--------+------------------+--------+ ICA Distal93      35                                         +----------+--------+--------+--------+------------------+--------+ ECA       75      17                                         +----------+--------+--------+--------+------------------+--------+ +----------+--------+--------+----------------+-------------------+           PSV cm/sEDV cm/sDescribe        Arm Pressure (mmHG) +----------+--------+--------+----------------+-------------------+ [id number]              Multiphasic, [id number]                 +----------+--------+--------+----------------+-------------------+ +---------+--------+--+--------+--+---------+ VertebralPSV cm/s44EDV cm/s17Antegrade +---------+--------+--+--------+--+---------+   Summary: Right Carotid: There is no evidence of stenosis in the right ICA. Left Carotid: There is no evidence of stenosis in the left ICA. Vertebrals:  Bilateral vertebral arteries demonstrate antegrade flow. Subclavians: Normal flow hemodynamics were seen in bilateral subclavian              arteries. *See table(s) above for measurements and observations.  Electronically signed by Shirlee More MD on 11/05/2021 at 12:25:15 PM.    Final     Recent Labs: Lab Results  Component Value Date   WBC 6.9 10/22/2021   HGB 13.5 10/22/2021   PLT 219 09/11/2021   NA 141 10/22/2021   K 4.1 10/22/2021   CL 102 10/22/2021   CO2 25 10/22/2021   GLUCOSE 141 (H) 10/22/2021   BUN 13 10/22/2021   CREATININE 0.64 10/22/2021   BILITOT 0.9 10/22/2021   ALKPHOS 109 10/22/2021   AST 22  10/22/2021   ALT 23 10/22/2021   PROT 5.9 (L) 10/22/2021   ALBUMIN  4.1 10/22/2021   CALCIUM 9.4 10/22/2021   GFRAA >60 01/21/2020    Speciality Comments: Avoid oral bisphosphonates-history of GER  Procedures:  No procedures performed Allergies: Celebrex [celecoxib] and Mobic [meloxicam]   Assessment / Plan:     Visit Diagnoses: Polyarthralgia - She presents today experiencing increased generalized arthralgias and myalgias over the past several weeks.  She had significant discomfort in both shoulder joints on a daily basis as well as new onset right knee joint pain and swelling which started last week.  On examination today she has painful range of motion of both shoulder joints with tenderness over the left AC joint.  She had a left shoulder joint injection performed by Dr. Onnie Graham and only had relief for a brief period of time.  Her right knee joint pain started last week with no injury prior to the onset of symptoms.  No erythema noted.  She had x-rays of the right knee yesterday on 11/26/2021 and the impression was reviewed today in the office.  She is scheduled for an MRI of the right knee tomorrow for further evaluation.  On examination she has painful range of motion of the right knee with warmth and swelling today.  Discussed that we will obtain the following lab work for further evaluation.  Depending on the MRI results right knee joint aspiration may be helpful for synovial fluid analysis.  We will review lab work as well as MRI results prior to coming up with a more detailed plan.  She will follow-up in the office in 1 month or sooner if needed.  Plan: Sedimentation rate, 14-3-3 eta Protein, Cyclic citrul peptide antibody, IgG, Rheumatoid factor, Uric acid, C-reactive protein, Angiotensin converting enzyme  Pain and swelling of right knee - She presents today with right knee joint pain and swelling which started 1 week ago.  She had no injury or fall prior to the onset of symptoms.  She is not experiencing any mechanical symptoms at this time.  She has not been  taking any over-the-counter products for symptomatic relief.  She was evaluated by her PCP yesterday and had x-rays of the right knee. X-ray results of the right knee from 11/26/2021 were reviewed today in the office: Tricompartmenatl degenerative change.Tiny knee effusion.  Questionable cystic lucency noted about the medial right femoral condyle. Mild infrapatellar soft tissue swelling. She is scheduled for a MRI of the right knee tomorrow for further evaluation.  The following lab work will be obtained today for further evaluation.  Discussed that if the MRI and/or labs are inconclusive she will benefit from a right knee joint aspiration for synovial fluid analysis.  We will wait to review lab results and MRI results prior to coming up with a more detailed plan going forward. Plan: Sedimentation rate, 14-3-3 eta Protein, Cyclic citrul peptide antibody, IgG, Rheumatoid factor, Uric acid, C-reactive protein, Angiotensin converting enzyme  Age-related osteoporosis without current pathological fracture - DXA 03/20/21: BMD LFN 0.672 with T-score -2.6. Patient was started on Forteo daily injections on 09/30/21.  She is unsure if her increased arthralgias are secondary to the use of Forteo.  Discussed that Forteo should not induce signs of inflammatory arthritis.  Trigger thumb of right hand - She gets cortisone injections at [facility].  Scheduled for an injection tomorrow.   Trigger thumb, left thumb - Followed at [facility].  DDD (degenerative disc disease), lumbar - Status post discectomy L2-L3  and L4-L5.  L4-L5 laminectomy and microdiscectomy on Sep 18, 2021 by Dr. Ronnald Ramp.  Fusion of lumbar spine - Status post L5-S1 fusion September 2021 by Dr. Rolena Infante.  Sep 18, 2021 L4-L5 core decompression by Dr. Sherley Bounds.  Other medical conditions are listed as follows:   Coronary artery disease of native artery of native heart with stable angina pectoris ([place])  Pure  hypercholesterolemia  Atherosclerosis  Moderate chronic obstructive pulmonary disease (HCC)  Diabetes mellitus type 1, uncomplicated (HCC)  History of gastroesophageal reflux (GERD)  Trichotillomania  History of sepsis  History of anxiety  Nicotine dependence, cigarettes, uncomplicated  Multiple actinic keratoses    Orders: Orders Placed This Encounter  Procedures   Sedimentation rate   14-3-3 eta Protein   Cyclic citrul peptide antibody, IgG   Rheumatoid factor   Uric acid   C-reactive protein   Angiotensin converting enzyme   No orders of the defined types were placed in this encounter.    Follow-Up Instructions: Return in 4 weeks (on 12/25/2021) for Osteoporosis, DDD.   Ofilia Neas, PA-C  Note - This record has been created using Dragon software.  Chart creation errors have been sought, but may not always  have been located. Such creation errors do not reflect on  the standard of medical care.

## 2021-11-27 NOTE — Progress Notes (Signed)
ESR WNL

## 2021-11-29 ENCOUNTER — Telehealth: Payer: Self-pay | Admitting: Internal Medicine

## 2021-11-29 NOTE — Telephone Encounter (Signed)
Fax received from pt's pharmacy for a refill request of pt's bupropion '150mg'$ . Dr. Shearon Stalls, please advise if you are okay refilling med for pt.

## 2021-11-29 NOTE — Telephone Encounter (Signed)
Called and verified with patient that she is no longer taking Wellbutrin anymore. Nothing further needed

## 2021-12-04 LAB — C-REACTIVE PROTEIN: CRP: 6.5 mg/L (ref <8.0)

## 2021-12-04 LAB — ANGIOTENSIN CONVERTING ENZYME: Angiotensin-Converting Enzyme: 49 U/L (ref 9–67)

## 2021-12-04 LAB — URIC ACID: Uric Acid, Serum: 5.3 mg/dL (ref 2.5–7.0)

## 2021-12-04 LAB — 14-3-3 ETA PROTEIN: 14-3-3 eta Protein: <0.2 ng/mL (ref <0.2)

## 2021-12-04 LAB — CYCLIC CITRUL PEPTIDE ANTIBODY, IGG: Cyclic Citrullin Peptide Ab: <16 UNITS

## 2021-12-04 LAB — RHEUMATOID FACTOR: Rheumatoid fact SerPl-aCnc: <14 IU/mL (ref <14)

## 2021-12-04 LAB — SEDIMENTATION RATE: Sed Rate: 22 mm/h (ref 0–30)

## 2021-12-04 NOTE — Progress Notes (Signed)
14-3-3 eta negative.   RF and Anti-CCP negative.   Uric acid within the desirable range. ACE WNL. CRP WNL.

## 2021-12-07 DIAGNOSIS — M1711 Unilateral primary osteoarthritis, right knee: Secondary | ICD-10-CM | POA: Insufficient documentation

## 2021-12-11 NOTE — Progress Notes (Deleted)
Office Visit Note  Patient: [name]             Date of Birth: Sep 21, 1960           MRN: [id number]             PCP: [name], MD Referring: [name], MD Visit Date: 12/24/2021 Occupation: '@GUAROCC'$ @  Subjective:  No chief complaint on file.   History of Present Illness: [name] is a 61 y.o. female ***   Activities of Daily Living:  Patient reports morning stiffness for *** {minute/hour:19697}.   Patient {ACTIONS;DENIES/REPORTS:21021675::"Denies"} nocturnal pain.  Difficulty dressing/grooming: {ACTIONS;DENIES/REPORTS:21021675::"Denies"} Difficulty climbing stairs: {ACTIONS;DENIES/REPORTS:21021675::"Denies"} Difficulty getting out of chair: {ACTIONS;DENIES/REPORTS:21021675::"Denies"} Difficulty using hands for taps, buttons, cutlery, and/or writing: {ACTIONS;DENIES/REPORTS:21021675::"Denies"}  No Rheumatology ROS completed.   PMFS History:  Patient Active Problem List   Diagnosis Date Noted   S/P lumbar laminectomy 09/18/2021   Elevated parathyroid hormone 06/06/2021   Osteoporosis of multiple sites without pathological fracture 06/06/2021   Accelerating angina (HCC) 05/06/2021   Complex regional pain syndrome of lower limb 03/07/2021   Pelvic pain 12/14/2020   RUQ pain 11/09/2020   Elevated alkaline phosphatase level 76/54/6503   Uncomplicated type 1 diabetes mellitus ([place]) 10/22/2020   Lipoma of left upper extremity 09/04/2020   Irritable bowel syndrome 09/03/2020   Idiopathic peripheral neuropathy 08/29/2020   Lipoma of abdominal wall 08/08/2020   Fever 07/18/2020   Hypotension 07/18/2020   Sepsis ([place]) 07/18/2020   Pneumonia 07/18/2020   Arthritis 07/18/2020   Blurred vision 07/11/2020   Facial tingling 07/11/2020   Diabetes mellitus without complication ([place])    Acute left ankle pain 03/09/2020   History of pneumonia 01/25/2020   History of sepsis 01/25/2020   Respiratory failure ([place]) 01/13/2020   HAP (hospital-acquired  pneumonia) 01/13/2020   Fusion of lumbar spine 01/11/2020   Trigger thumb of right hand 11/14/2019   Low back pain 10/22/2019   Obsessive-compulsive disorder 10/14/2019   Diabetes mellitus due to underlying condition with unspecified complications ([place]) 54/65/6812   Primary osteoarthritis involving multiple joints 08/24/2019   Alcohol use 04/15/2019   Elevated LFTs 03/10/2019   Lumbar post-laminectomy syndrome 03/04/2019   Acute bilateral thoracic back pain 12/03/2018   Pain of left hip joint 07/23/2018   Polyarthralgia 06/11/2018   Bursitis of both hips 04/14/2018   Left foot pain 01/28/2018   CAD (coronary artery disease) 12/24/2017   Secondary diabetes mellitus ([place]) 12/24/2017   Coronary arteriosclerosis 12/24/2017   Dyspnea on exertion 12/04/2017   Attention deficit 12/04/2017   Intermittent claudication ([place]) 12/04/2017   History of metabolic disorder 75/17/0017   Lung mass 12/04/2017   Myositis 12/04/2017   Erythrocytosis 12/04/2017   Senile purpura ([place]) 12/04/2017   Chest pain 11/06/2017   Multiple actinic keratoses 11/06/2017   Degeneration of lumbar intervertebral disc 11/06/2017   Fatigue 11/06/2017   Insulin long-term use ([place]) 11/06/2017   Menopausal disorder 11/06/2017   Purpura ([place]) 11/06/2017   Moderate chronic obstructive pulmonary disease ([place]) 49/44/9675   Folic acid deficiency 91/63/8466   Malaise and fatigue 11/06/2017   Arteriosclerotic vascular disease 11/06/2017   Dysphagia 11/04/2017   Sciatica 11/03/2017   Scoliosis deformity of spine 11/03/2017   Lumbar radiculopathy 05/26/2017   Personal history of (healed) traumatic fracture 12/31/2016   Deficiency of other specified B group vitamins 11/07/2016   Mixed hyperlipidemia 04/09/2016   Smoking greater than 30 pack years 02/26/2016   Cigarette smoker 02/26/2016   Nicotine dependence, cigarettes,  uncomplicated 59/74/1638   Diabetes mellitus type 1, uncomplicated ([place]) 45/36/4680   Tobacco abuse  02/11/2016   Tobacco user 02/11/2016   Pure hypercholesterolemia 02/11/2016   Atherosclerosis of native coronary artery of native heart without angina pectoris 02/11/2016   Coronary artery disease of native artery of native heart with stable angina pectoris ([place]) 01/25/2016   Abnormal thyroid function test 12/06/2015   Chronic bronchitis ([place]) 12/04/2015   Overweight with body mass index (BMI) 25.0-29.9 07/11/2015   BMI 27.0-27.9,adult 07/11/2015   Pain in both lower extremities 07/10/2015   Anxiety disorder 06/05/2015   Atherosclerosis 06/05/2015   Hemangioma 06/05/2015   Primary insomnia 06/05/2015   Trichotillomania 06/05/2015   HNP (herniated nucleus pulposus), lumbar 05/10/2014    Past Medical History:  Diagnosis Date   Abnormal thyroid function test 12/06/2015   Accelerating angina ([place]) 05/06/2021   Acute bilateral thoracic back pain 12/03/2018   Acute left ankle pain 03/09/2020   Alcohol use 04/15/2019   Formatting of this note might be different from the original. Recommend abstinence from alcohol   Anxiety disorder 06/05/2015   Last Assessment & Plan:  Formatting of this note might be different from the original. Had started her on some xanax about week ago and she feels it has helped her and she is taking about half tablet for this and will continue with it as she has been compliant with it   Arteriosclerotic vascular disease 11/06/2017   Arthritis    back    Atherosclerosis 06/05/2015   Formatting of this note might be different from the original. Seen on CTs of chest.   Atherosclerosis of native coronary artery of native heart without angina pectoris 02/11/2016   Attention deficit 12/04/2017   Blurred vision 07/11/2020   BMI 27.0-27.9,adult 07/11/2015   Last Assessment & Plan:  Formatting of this note might be different from the original. Relevant Hx: Course: Daily Update: Today's Plan:difficult for her with the insulin as well as her abilify and the weight is  increaseing for her rather than decreasing, she has cut her alcohol intake back, she has worked on her diet more with decreasing her carb intake as well and still difficulty discussed with    Bursitis of both hips 04/14/2018   CAD (coronary artery disease) 12/24/2017   Chest pain 11/06/2017   Chronic bronchitis ([place]) 12/04/2015   Formatting of this note might be different from the original. PFT ratio 88% FEV1 103% FVC 94% DLCO 76%  Last Assessment & Plan:  Formatting of this note might be different from the original. We discussed this as she has been coughing again for about 2 mnths and she sounds congested on exam with previous ease for pneumonia will place her on levaquin and have reviewed with her the importance of her    Cigarette smoker 02/26/2016   Complex regional pain syndrome of lower limb 03/07/2021   Coronary arteriosclerosis 12/24/2017   Coronary artery disease of native artery of native heart with stable angina pectoris ([place]) 01/25/2016   Formatting of this note might be different from the original. 2017:  50% LAD lesion.  Calcium noted other places.  Heart cath December 31, 2017 with a 30% ostial LAD lesion otherwise normal coronary arteries seen with LVEF 55 to 65%   Deficiency of other specified B group vitamins 11/07/2016   Degeneration of lumbar intervertebral disc 11/06/2017   Diabetes mellitus due to underlying condition with unspecified complications ([place]) 32/03/2481   Diabetes mellitus type 1, uncomplicated ([place]) 50/06/7046  Added automatically from request for surgery [id number]  Formatting of this note might be different from the original. Added automatically from request for surgery [phone]   Diabetes mellitus without complication ([place])    Type 1- injections only- recent change to [place] with improved A1C reading.   Dysphagia 11/04/2017   Dyspnea on exertion 12/04/2017   Elevated alkaline phosphatase level 11/09/2020   Elevated LFTs 03/10/2019   Formatting of this note  might be different from the original. History.  Cannot change to this.   Elevated parathyroid hormone 06/06/2021   Erythrocytosis 12/04/2017   Facial tingling 07/11/2020   Fatigue 11/06/2017   Fever 02/77/4128   Folic acid deficiency 78/67/6720   Fusion of lumbar spine 01/11/2020   HAP (hospital-acquired pneumonia) 01/13/2020   Hemangioma 11/06/2017   History of metabolic disorder 94/70/9628   History of pneumonia 01/25/2020   Formatting of this note might be different from the original. Recent mutifocal pneumonia September 2021 Formatting of this note might be different from the original. Formatting of this note might be different from the original. Recent mutifocal pneumonia September 2021   History of sepsis 01/25/2020   Formatting of this note might be different from the original. Recent septic syndrome with multifocal pneumonia Formatting of this note might be different from the original. Formatting of this note might be different from the original. Recent septic syndrome with multifocal pneumonia   HNP (herniated nucleus pulposus), lumbar 05/10/2014   Hypotension 07/18/2020   Idiopathic peripheral neuropathy 08/29/2020   Insulin long-term use ([place]) 11/06/2017   Intermittent claudication ([place]) 12/04/2017   Irritable bowel syndrome 09/03/2020   Left foot pain 01/28/2018   Lipoma of abdominal wall 08/08/2020   Lipoma of left upper extremity 09/04/2020   Low back pain 10/22/2019   Lumbar post-laminectomy syndrome 03/04/2019   Lumbar radiculopathy 05/26/2017   Lung mass 12/04/2017   Malaise and fatigue 11/06/2017   Menopausal disorder 11/06/2017   Mixed hyperlipidemia 04/09/2016   Formatting of this note might be different from the original. Added automatically from request for surgery [id number]   Moderate chronic obstructive pulmonary disease ([place]) 11/06/2017   Multiple actinic keratoses 11/06/2017   Myositis 12/04/2017   Nicotine dependence, cigarettes, uncomplicated 76/54/6503    Obsessive-compulsive disorder 10/14/2019   Formatting of this note might be different from the original. Formerly followed with [facility].   Osteoporosis of multiple sites without pathological fracture 06/06/2021   Overweight with body mass index (BMI) 25.0-29.9 07/11/2015   Last Assessment & Plan:  Relevant Hx: Course: Daily Update: Today's Plan:difficult for her with the insulin as well as her abilify and the weight is increaseing for her rather than decreasing, she has cut her alcohol intake back, she has worked on her diet more with decreasing her carb intake as well and still difficulty discussed with the saxenda and will try to get this approved for her and see    Pain in both lower extremities 07/10/2015   Last Assessment & Plan:  Formatting of this note might be different from the original. Relevant Hx: Course: Daily Update: Today's Plan:she had pain to her right anterior thigh there is edema of both legs and she has fear for DVT which she is going to have [place] of and confirm ok, she has increased her weight which is part of her edema I believe as well as her insulin usage. She is advised as well tha   Pain of left hip joint 07/23/2018   Pelvic pain 12/14/2020   Personal history  of (healed) traumatic fracture 12/31/2016   Pneumonia    Polyarthralgia 06/11/2018   Primary insomnia 06/05/2015   Primary osteoarthritis involving multiple joints 08/24/2019   Pure hypercholesterolemia 02/11/2016   Purpura ([place]) 11/06/2017   Respiratory failure ([place]) 11/2018   RUQ pain 11/09/2020   S/P lumbar laminectomy 09/18/2021   Sciatica 11/03/2017   Scoliosis deformity of spine 11/03/2017   Secondary diabetes mellitus ([place]) 12/24/2017   Senile purpura ([place]) 12/04/2017   Sepsis ([place]) 07/18/2020   Smoking greater than 30 pack years 02/26/2016   Tobacco abuse 02/11/2016   Tobacco user 02/11/2016   Trichotillomania    hx.    Trigger thumb of right hand 99/83/3825   Uncomplicated type 1 diabetes mellitus  ([place]) 10/22/2020    Family History  Problem Relation Age of Onset   Diabetes Mother    Cancer Mother    Hypertension Father    Heart disease Father    Diabetes Father    Healthy Brother    Healthy Son    Diabetes Son    Healthy Daughter    Past Surgical History:  Procedure Laterality Date   ABDOMINAL EXPOSURE N/A 01/11/2020   Procedure: ABDOMINAL EXPOSURE;  Surgeon: Marty Heck, MD;  Location: [facility];  Service: Vascular;  Laterality: N/A;   ANTERIOR LUMBAR FUSION N/A 01/11/2020   Procedure: ANTERIOR LUMBAR INTERBODY FUSION  LUMBAR FIVE-SACRAL ONE, LEFT LUMBAR TWO-THREE FORAMINOTOMY, DISECTOMY;  Surgeon: Melina Schools, MD;  Location: [facility];  Service: Orthopedics;  Laterality: N/A;   APPENDECTOMY  04/28/1970   BACK SURGERY  2016   CARDIAC CATHETERIZATION  12/31/2017   COLONOSCOPY  04/28/2017   EXCISION/RELEASE BURSA HIP Left 04/14/2018   Procedure: Excision trochanteric bursa left hip;  Surgeon: Susa Day, MD;  Location: [facility];  Service: Orthopedics;  Laterality: Left;  60 mins   HAND SURGERY     HIP OPEN REDUCTION Right    ORIF   INTRAVASCULAR PRESSURE WIRE/FFR STUDY N/A 05/06/2021   Procedure: INTRAVASCULAR PRESSURE WIRE/FFR STUDY;  Surgeon: Nelva Bush, MD;  Location: [facility];  Service: Cardiovascular;  Laterality: N/A;   KNEE ARTHROSCOPY     LEFT HEART CATH AND CORONARY ANGIOGRAPHY N/A 12/31/2017   Procedure: LEFT HEART CATH AND CORONARY ANGIOGRAPHY;  Surgeon: Martinique, Peter M, MD;  Location: [facility];  Service: Cardiovascular;  Laterality: N/A;   LEFT HEART CATH AND CORONARY ANGIOGRAPHY N/A 05/06/2021   Procedure: LEFT HEART CATH AND CORONARY ANGIOGRAPHY;  Surgeon: Nelva Bush, MD;  Location: [facility];  Service: Cardiovascular;  Laterality: N/A;   LUMBAR LAMINECTOMY/DECOMPRESSION MICRODISCECTOMY Left 05/10/2014   Procedure: MICRODISCECTOMY LUMBAR DECOMPRESSION L5-S1 LEFT ;  Surgeon: Johnn Hai, MD;  Location: [facility];  Service:  Orthopedics;  Laterality: Left;   LUMBAR LAMINECTOMY/DECOMPRESSION MICRODISCECTOMY N/A 01/11/2020   Procedure: LUMBAR LAMINECTOMY/DECOMPRESSION MICRODISCECTOMY LEFT LUMBAR TWO-THREE;  Surgeon: Melina Schools, MD;  Location: [facility];  Service: Orthopedics;  Laterality: N/A;   LUMBAR LAMINECTOMY/DECOMPRESSION MICRODISCECTOMY Right 09/18/2021   Procedure: Laminectomy and Foraminotomy - Lumbar Four-Lumbar Five - right;  Surgeon: Eustace Moore, MD;  Location: [facility];  Service: Neurosurgery;  Laterality: Right;  Laminectomy and Foraminotomy - Lumbar Four-Lumbar Five - right   ORIF WRIST FRACTURE Right    retained hardware   ORTHOPAEDIC SURGERY  04/14/2018   TUBAL LIGATION  12/25/1995   Social History   Social History Narrative   Not on file   Immunization History  Administered Date(s) Administered   Influenza,inj,Quad PF,6+ Mos 01/21/2016,  12/31/2016, 02/09/2018, 01/18/2021   Influenza,inj,quad, With Preservative 12/27/2016   Influenza-Unspecified 01/21/2016, 12/31/2016   Moderna Sars-Covid-2 Vaccination 04/29/2019, 05/30/2019   Pneumococcal Conjugate-13 07/23/2017   Pneumococcal Polysaccharide-23 02/01/2021     Objective: Vital Signs: LMP 05/04/2008    Physical Exam   Musculoskeletal Exam: ***  CDAI Exam: CDAI Score: -- Patient Global: --; Provider Global: -- Swollen: --; Tender: -- Joint Exam 12/24/2021   No joint exam has been documented for this visit   There is currently no information documented on the homunculus. Go to the Rheumatology activity and complete the homunculus joint exam.  Investigation: No additional findings.  Imaging: No results found.  Recent Labs: Lab Results  Component Value Date   WBC 6.9 10/22/2021   HGB 13.5 10/22/2021   PLT 219 09/11/2021   NA 141 10/22/2021   K 4.1 10/22/2021   CL 102 10/22/2021   CO2 25 10/22/2021   GLUCOSE 141 (H) 10/22/2021   BUN 13 10/22/2021   CREATININE 0.64 10/22/2021   BILITOT 0.9 10/22/2021   ALKPHOS 109  10/22/2021   AST 22 10/22/2021   ALT 23 10/22/2021   PROT 5.9 (L) 10/22/2021   ALBUMIN 4.1 10/22/2021   CALCIUM 9.4 10/22/2021   GFRAA >60 01/21/2020    Speciality Comments: Avoid oral bisphosphonates-history of GER  Procedures:  No procedures performed Allergies: Celebrex [celecoxib] and Mobic [meloxicam]   Assessment / Plan:     Visit Diagnoses: No diagnosis found.  Orders: No orders of the defined types were placed in this encounter.  No orders of the defined types were placed in this encounter.   Face-to-face time spent with patient was *** minutes. Greater than 50% of time was spent in counseling and coordination of care.  Follow-Up Instructions: No follow-ups on file.   Earnestine Mealing, CMA  Note - This record has been created using Editor, commissioning.  Chart creation errors have been sought, but may not always  have been located. Such creation errors do not reflect on  the standard of medical care.

## 2021-12-17 ENCOUNTER — Other Ambulatory Visit: Payer: Self-pay | Admitting: Obstetrics and Gynecology

## 2021-12-17 DIAGNOSIS — N644 Mastodynia: Secondary | ICD-10-CM

## 2021-12-23 NOTE — Progress Notes (Deleted)
Office Visit Note  Patient: [name]             Date of Birth: Aug 31, 1960           MRN: [id number]             PCP: [name], MD Referring: [name], MD Visit Date: 12/26/2021 Occupation: '@GUAROCC'$ @  Subjective:  No chief complaint on file.   History of Present Illness: [name] is a 61 y.o. female ***   Activities of Daily Living:  Patient reports morning stiffness for *** {minute/hour:19697}.   Patient {ACTIONS;DENIES/REPORTS:21021675::"Denies"} nocturnal pain.  Difficulty dressing/grooming: {ACTIONS;DENIES/REPORTS:21021675::"Denies"} Difficulty climbing stairs: {ACTIONS;DENIES/REPORTS:21021675::"Denies"} Difficulty getting out of chair: {ACTIONS;DENIES/REPORTS:21021675::"Denies"} Difficulty using hands for taps, buttons, cutlery, and/or writing: {ACTIONS;DENIES/REPORTS:21021675::"Denies"}  No Rheumatology ROS completed.   PMFS History:  Patient Active Problem List   Diagnosis Date Noted   S/P lumbar laminectomy 09/18/2021   Elevated parathyroid hormone 06/06/2021   Osteoporosis of multiple sites without pathological fracture 06/06/2021   Accelerating angina (HCC) 05/06/2021   Complex regional pain syndrome of lower limb 03/07/2021   Pelvic pain 12/14/2020   RUQ pain 11/09/2020   Elevated alkaline phosphatase level 73/53/2992   Uncomplicated type 1 diabetes mellitus ([place]) 10/22/2020   Lipoma of left upper extremity 09/04/2020   Irritable bowel syndrome 09/03/2020   Idiopathic peripheral neuropathy 08/29/2020   Lipoma of abdominal wall 08/08/2020   Fever 07/18/2020   Hypotension 07/18/2020   Sepsis ([place]) 07/18/2020   Pneumonia 07/18/2020   Arthritis 07/18/2020   Blurred vision 07/11/2020   Facial tingling 07/11/2020   Diabetes mellitus without complication ([place])    Acute left ankle pain 03/09/2020   History of pneumonia 01/25/2020   History of sepsis 01/25/2020   Respiratory failure ([place]) 01/13/2020   HAP (hospital-acquired  pneumonia) 01/13/2020   Fusion of lumbar spine 01/11/2020   Trigger thumb of right hand 11/14/2019   Low back pain 10/22/2019   Obsessive-compulsive disorder 10/14/2019   Diabetes mellitus due to underlying condition with unspecified complications ([place]) 42/68/3419   Primary osteoarthritis involving multiple joints 08/24/2019   Alcohol use 04/15/2019   Elevated LFTs 03/10/2019   Lumbar post-laminectomy syndrome 03/04/2019   Acute bilateral thoracic back pain 12/03/2018   Pain of left hip joint 07/23/2018   Polyarthralgia 06/11/2018   Bursitis of both hips 04/14/2018   Left foot pain 01/28/2018   CAD (coronary artery disease) 12/24/2017   Secondary diabetes mellitus ([place]) 12/24/2017   Coronary arteriosclerosis 12/24/2017   Dyspnea on exertion 12/04/2017   Attention deficit 12/04/2017   Intermittent claudication ([place]) 12/04/2017   History of metabolic disorder 62/22/9798   Lung mass 12/04/2017   Myositis 12/04/2017   Erythrocytosis 12/04/2017   Senile purpura ([place]) 12/04/2017   Chest pain 11/06/2017   Multiple actinic keratoses 11/06/2017   Degeneration of lumbar intervertebral disc 11/06/2017   Fatigue 11/06/2017   Insulin long-term use ([place]) 11/06/2017   Menopausal disorder 11/06/2017   Purpura ([place]) 11/06/2017   Moderate chronic obstructive pulmonary disease ([place]) 92/02/9416   Folic acid deficiency 40/81/4481   Malaise and fatigue 11/06/2017   Arteriosclerotic vascular disease 11/06/2017   Dysphagia 11/04/2017   Sciatica 11/03/2017   Scoliosis deformity of spine 11/03/2017   Lumbar radiculopathy 05/26/2017   Personal history of (healed) traumatic fracture 12/31/2016   Deficiency of other specified B group vitamins 11/07/2016   Mixed hyperlipidemia 04/09/2016   Smoking greater than 30 pack years 02/26/2016   Cigarette smoker 02/26/2016   Nicotine dependence, cigarettes,  uncomplicated 97/67/3419   Diabetes mellitus type 1, uncomplicated ([place]) 37/90/2409   Tobacco abuse  02/11/2016   Tobacco user 02/11/2016   Pure hypercholesterolemia 02/11/2016   Atherosclerosis of native coronary artery of native heart without angina pectoris 02/11/2016   Coronary artery disease of native artery of native heart with stable angina pectoris ([place]) 01/25/2016   Abnormal thyroid function test 12/06/2015   Chronic bronchitis ([place]) 12/04/2015   Overweight with body mass index (BMI) 25.0-29.9 07/11/2015   BMI 27.0-27.9,adult 07/11/2015   Pain in both lower extremities 07/10/2015   Anxiety disorder 06/05/2015   Atherosclerosis 06/05/2015   Hemangioma 06/05/2015   Primary insomnia 06/05/2015   Trichotillomania 06/05/2015   HNP (herniated nucleus pulposus), lumbar 05/10/2014    Past Medical History:  Diagnosis Date   Abnormal thyroid function test 12/06/2015   Accelerating angina ([place]) 05/06/2021   Acute bilateral thoracic back pain 12/03/2018   Acute left ankle pain 03/09/2020   Alcohol use 04/15/2019   Formatting of this note might be different from the original. Recommend abstinence from alcohol   Anxiety disorder 06/05/2015   Last Assessment & Plan:  Formatting of this note might be different from the original. Had started her on some xanax about week ago and she feels it has helped her and she is taking about half tablet for this and will continue with it as she has been compliant with it   Arteriosclerotic vascular disease 11/06/2017   Arthritis    back    Atherosclerosis 06/05/2015   Formatting of this note might be different from the original. Seen on CTs of chest.   Atherosclerosis of native coronary artery of native heart without angina pectoris 02/11/2016   Attention deficit 12/04/2017   Blurred vision 07/11/2020   BMI 27.0-27.9,adult 07/11/2015   Last Assessment & Plan:  Formatting of this note might be different from the original. Relevant Hx: Course: Daily Update: Today's Plan:difficult for her with the insulin as well as her abilify and the weight is  increaseing for her rather than decreasing, she has cut her alcohol intake back, she has worked on her diet more with decreasing her carb intake as well and still difficulty discussed with    Bursitis of both hips 04/14/2018   CAD (coronary artery disease) 12/24/2017   Chest pain 11/06/2017   Chronic bronchitis ([place]) 12/04/2015   Formatting of this note might be different from the original. PFT ratio 88% FEV1 103% FVC 94% DLCO 76%  Last Assessment & Plan:  Formatting of this note might be different from the original. We discussed this as she has been coughing again for about 2 mnths and she sounds congested on exam with previous ease for pneumonia will place her on levaquin and have reviewed with her the importance of her    Cigarette smoker 02/26/2016   Complex regional pain syndrome of lower limb 03/07/2021   Coronary arteriosclerosis 12/24/2017   Coronary artery disease of native artery of native heart with stable angina pectoris ([place]) 01/25/2016   Formatting of this note might be different from the original. 2017:  50% LAD lesion.  Calcium noted other places.  Heart cath December 31, 2017 with a 30% ostial LAD lesion otherwise normal coronary arteries seen with LVEF 55 to 65%   Deficiency of other specified B group vitamins 11/07/2016   Degeneration of lumbar intervertebral disc 11/06/2017   Diabetes mellitus due to underlying condition with unspecified complications ([place]) 73/53/2992   Diabetes mellitus type 1, uncomplicated ([place]) 42/68/3419  Added automatically from request for surgery [id number]  Formatting of this note might be different from the original. Added automatically from request for surgery [phone]   Diabetes mellitus without complication ([place])    Type 1- injections only- recent change to [place] with improved A1C reading.   Dysphagia 11/04/2017   Dyspnea on exertion 12/04/2017   Elevated alkaline phosphatase level 11/09/2020   Elevated LFTs 03/10/2019   Formatting of this note  might be different from the original. History.  Cannot change to this.   Elevated parathyroid hormone 06/06/2021   Erythrocytosis 12/04/2017   Facial tingling 07/11/2020   Fatigue 11/06/2017   Fever 81/85/6314   Folic acid deficiency 97/05/6376   Fusion of lumbar spine 01/11/2020   HAP (hospital-acquired pneumonia) 01/13/2020   Hemangioma 11/06/2017   History of metabolic disorder 58/85/0277   History of pneumonia 01/25/2020   Formatting of this note might be different from the original. Recent mutifocal pneumonia September 2021 Formatting of this note might be different from the original. Formatting of this note might be different from the original. Recent mutifocal pneumonia September 2021   History of sepsis 01/25/2020   Formatting of this note might be different from the original. Recent septic syndrome with multifocal pneumonia Formatting of this note might be different from the original. Formatting of this note might be different from the original. Recent septic syndrome with multifocal pneumonia   HNP (herniated nucleus pulposus), lumbar 05/10/2014   Hypotension 07/18/2020   Idiopathic peripheral neuropathy 08/29/2020   Insulin long-term use ([place]) 11/06/2017   Intermittent claudication ([place]) 12/04/2017   Irritable bowel syndrome 09/03/2020   Left foot pain 01/28/2018   Lipoma of abdominal wall 08/08/2020   Lipoma of left upper extremity 09/04/2020   Low back pain 10/22/2019   Lumbar post-laminectomy syndrome 03/04/2019   Lumbar radiculopathy 05/26/2017   Lung mass 12/04/2017   Malaise and fatigue 11/06/2017   Menopausal disorder 11/06/2017   Mixed hyperlipidemia 04/09/2016   Formatting of this note might be different from the original. Added automatically from request for surgery [id number]   Moderate chronic obstructive pulmonary disease ([place]) 11/06/2017   Multiple actinic keratoses 11/06/2017   Myositis 12/04/2017   Nicotine dependence, cigarettes, uncomplicated 67/67/2094    Obsessive-compulsive disorder 10/14/2019   Formatting of this note might be different from the original. Formerly followed with [facility].   Osteoporosis of multiple sites without pathological fracture 06/06/2021   Overweight with body mass index (BMI) 25.0-29.9 07/11/2015   Last Assessment & Plan:  Relevant Hx: Course: Daily Update: Today's Plan:difficult for her with the insulin as well as her abilify and the weight is increaseing for her rather than decreasing, she has cut her alcohol intake back, she has worked on her diet more with decreasing her carb intake as well and still difficulty discussed with the saxenda and will try to get this approved for her and see    Pain in both lower extremities 07/10/2015   Last Assessment & Plan:  Formatting of this note might be different from the original. Relevant Hx: Course: Daily Update: Today's Plan:she had pain to her right anterior thigh there is edema of both legs and she has fear for DVT which she is going to have [place] of and confirm ok, she has increased her weight which is part of her edema I believe as well as her insulin usage. She is advised as well tha   Pain of left hip joint 07/23/2018   Pelvic pain 12/14/2020   Personal history  of (healed) traumatic fracture 12/31/2016   Pneumonia    Polyarthralgia 06/11/2018   Primary insomnia 06/05/2015   Primary osteoarthritis involving multiple joints 08/24/2019   Pure hypercholesterolemia 02/11/2016   Purpura ([place]) 11/06/2017   Respiratory failure ([place]) 11/2018   RUQ pain 11/09/2020   S/P lumbar laminectomy 09/18/2021   Sciatica 11/03/2017   Scoliosis deformity of spine 11/03/2017   Secondary diabetes mellitus ([place]) 12/24/2017   Senile purpura ([place]) 12/04/2017   Sepsis ([place]) 07/18/2020   Smoking greater than 30 pack years 02/26/2016   Tobacco abuse 02/11/2016   Tobacco user 02/11/2016   Trichotillomania    hx.    Trigger thumb of right hand 89/21/1941   Uncomplicated type 1 diabetes mellitus  ([place]) 10/22/2020    Family History  Problem Relation Age of Onset   Diabetes Mother    Cancer Mother    Hypertension Father    Heart disease Father    Diabetes Father    Healthy Brother    Healthy Son    Diabetes Son    Healthy Daughter    Past Surgical History:  Procedure Laterality Date   ABDOMINAL EXPOSURE N/A 01/11/2020   Procedure: ABDOMINAL EXPOSURE;  Surgeon: Marty Heck, MD;  Location: [facility];  Service: Vascular;  Laterality: N/A;   ANTERIOR LUMBAR FUSION N/A 01/11/2020   Procedure: ANTERIOR LUMBAR INTERBODY FUSION  LUMBAR FIVE-SACRAL ONE, LEFT LUMBAR TWO-THREE FORAMINOTOMY, DISECTOMY;  Surgeon: Melina Schools, MD;  Location: [facility];  Service: Orthopedics;  Laterality: N/A;   APPENDECTOMY  04/28/1970   BACK SURGERY  2016   CARDIAC CATHETERIZATION  12/31/2017   COLONOSCOPY  04/28/2017   EXCISION/RELEASE BURSA HIP Left 04/14/2018   Procedure: Excision trochanteric bursa left hip;  Surgeon: Susa Day, MD;  Location: [facility];  Service: Orthopedics;  Laterality: Left;  60 mins   HAND SURGERY     HIP OPEN REDUCTION Right    ORIF   INTRAVASCULAR PRESSURE WIRE/FFR STUDY N/A 05/06/2021   Procedure: INTRAVASCULAR PRESSURE WIRE/FFR STUDY;  Surgeon: Nelva Bush, MD;  Location: [facility];  Service: Cardiovascular;  Laterality: N/A;   KNEE ARTHROSCOPY     LEFT HEART CATH AND CORONARY ANGIOGRAPHY N/A 12/31/2017   Procedure: LEFT HEART CATH AND CORONARY ANGIOGRAPHY;  Surgeon: Martinique, Peter M, MD;  Location: [facility];  Service: Cardiovascular;  Laterality: N/A;   LEFT HEART CATH AND CORONARY ANGIOGRAPHY N/A 05/06/2021   Procedure: LEFT HEART CATH AND CORONARY ANGIOGRAPHY;  Surgeon: Nelva Bush, MD;  Location: [facility];  Service: Cardiovascular;  Laterality: N/A;   LUMBAR LAMINECTOMY/DECOMPRESSION MICRODISCECTOMY Left 05/10/2014   Procedure: MICRODISCECTOMY LUMBAR DECOMPRESSION L5-S1 LEFT ;  Surgeon: Johnn Hai, MD;  Location: [facility];  Service:  Orthopedics;  Laterality: Left;   LUMBAR LAMINECTOMY/DECOMPRESSION MICRODISCECTOMY N/A 01/11/2020   Procedure: LUMBAR LAMINECTOMY/DECOMPRESSION MICRODISCECTOMY LEFT LUMBAR TWO-THREE;  Surgeon: Melina Schools, MD;  Location: [facility];  Service: Orthopedics;  Laterality: N/A;   LUMBAR LAMINECTOMY/DECOMPRESSION MICRODISCECTOMY Right 09/18/2021   Procedure: Laminectomy and Foraminotomy - Lumbar Four-Lumbar Five - right;  Surgeon: Eustace Moore, MD;  Location: [facility];  Service: Neurosurgery;  Laterality: Right;  Laminectomy and Foraminotomy - Lumbar Four-Lumbar Five - right   ORIF WRIST FRACTURE Right    retained hardware   ORTHOPAEDIC SURGERY  04/14/2018   TUBAL LIGATION  12/25/1995   Social History   Social History Narrative   Not on file   Immunization History  Administered Date(s) Administered   Influenza,inj,Quad PF,6+ Mos 01/21/2016,  12/31/2016, 02/09/2018, 01/18/2021   Influenza,inj,quad, With Preservative 12/27/2016   Influenza-Unspecified 01/21/2016, 12/31/2016   Moderna Sars-Covid-2 Vaccination 04/29/2019, 05/30/2019   Pneumococcal Conjugate-13 07/23/2017   Pneumococcal Polysaccharide-23 02/01/2021     Objective: Vital Signs: LMP 05/04/2008    Physical Exam   Musculoskeletal Exam: ***  CDAI Exam: CDAI Score: -- Patient Global: --; Provider Global: -- Swollen: --; Tender: -- Joint Exam 12/26/2021   No joint exam has been documented for this visit   There is currently no information documented on the homunculus. Go to the Rheumatology activity and complete the homunculus joint exam.  Investigation: No additional findings.  Imaging: No results found.  Recent Labs: Lab Results  Component Value Date   WBC 6.9 10/22/2021   HGB 13.5 10/22/2021   PLT 219 09/11/2021   NA 141 10/22/2021   K 4.1 10/22/2021   CL 102 10/22/2021   CO2 25 10/22/2021   GLUCOSE 141 (H) 10/22/2021   BUN 13 10/22/2021   CREATININE 0.64 10/22/2021   BILITOT 0.9 10/22/2021   ALKPHOS 109  10/22/2021   AST 22 10/22/2021   ALT 23 10/22/2021   PROT 5.9 (L) 10/22/2021   ALBUMIN 4.1 10/22/2021   CALCIUM 9.4 10/22/2021   GFRAA >60 01/21/2020    Speciality Comments: Avoid oral bisphosphonates-history of GER  Procedures:  No procedures performed Allergies: Celebrex [celecoxib] and Mobic [meloxicam]   Assessment / Plan:     Visit Diagnoses: No diagnosis found.  Orders: No orders of the defined types were placed in this encounter.  No orders of the defined types were placed in this encounter.   Face-to-face time spent with patient was *** minutes. Greater than 50% of time was spent in counseling and coordination of care.  Follow-Up Instructions: No follow-ups on file.   Earnestine Mealing, CMA  Note - This record has been created using Editor, commissioning.  Chart creation errors have been sought, but may not always  have been located. Such creation errors do not reflect on  the standard of medical care.

## 2021-12-24 ENCOUNTER — Ambulatory Visit: Payer: BC Managed Care – PPO | Admitting: Physician Assistant

## 2021-12-24 DIAGNOSIS — F17210 Nicotine dependence, cigarettes, uncomplicated: Secondary | ICD-10-CM

## 2021-12-24 DIAGNOSIS — M25561 Pain in right knee: Secondary | ICD-10-CM

## 2021-12-24 DIAGNOSIS — Z8719 Personal history of other diseases of the digestive system: Secondary | ICD-10-CM

## 2021-12-24 DIAGNOSIS — J449 Chronic obstructive pulmonary disease, unspecified: Secondary | ICD-10-CM

## 2021-12-24 DIAGNOSIS — E7800 Pure hypercholesterolemia, unspecified: Secondary | ICD-10-CM

## 2021-12-24 DIAGNOSIS — Z8619 Personal history of other infectious and parasitic diseases: Secondary | ICD-10-CM

## 2021-12-24 DIAGNOSIS — L570 Actinic keratosis: Secondary | ICD-10-CM

## 2021-12-24 DIAGNOSIS — M4326 Fusion of spine, lumbar region: Secondary | ICD-10-CM

## 2021-12-24 DIAGNOSIS — M810 Age-related osteoporosis without current pathological fracture: Secondary | ICD-10-CM

## 2021-12-24 DIAGNOSIS — M5136 Other intervertebral disc degeneration, lumbar region: Secondary | ICD-10-CM

## 2021-12-24 DIAGNOSIS — Z8659 Personal history of other mental and behavioral disorders: Secondary | ICD-10-CM

## 2021-12-24 DIAGNOSIS — E109 Type 1 diabetes mellitus without complications: Secondary | ICD-10-CM

## 2021-12-24 DIAGNOSIS — F633 Trichotillomania: Secondary | ICD-10-CM

## 2021-12-24 DIAGNOSIS — M65311 Trigger thumb, right thumb: Secondary | ICD-10-CM

## 2021-12-24 DIAGNOSIS — I25118 Atherosclerotic heart disease of native coronary artery with other forms of angina pectoris: Secondary | ICD-10-CM

## 2021-12-24 DIAGNOSIS — M65312 Trigger thumb, left thumb: Secondary | ICD-10-CM

## 2021-12-24 DIAGNOSIS — M2550 Pain in unspecified joint: Secondary | ICD-10-CM

## 2021-12-24 DIAGNOSIS — I7090 Unspecified atherosclerosis: Secondary | ICD-10-CM

## 2021-12-26 ENCOUNTER — Ambulatory Visit: Payer: BC Managed Care – PPO | Admitting: Physician Assistant

## 2021-12-26 DIAGNOSIS — M25461 Effusion, right knee: Secondary | ICD-10-CM

## 2021-12-26 DIAGNOSIS — M65312 Trigger thumb, left thumb: Secondary | ICD-10-CM

## 2021-12-26 DIAGNOSIS — F17210 Nicotine dependence, cigarettes, uncomplicated: Secondary | ICD-10-CM

## 2021-12-26 DIAGNOSIS — M810 Age-related osteoporosis without current pathological fracture: Secondary | ICD-10-CM

## 2021-12-26 DIAGNOSIS — L570 Actinic keratosis: Secondary | ICD-10-CM

## 2021-12-26 DIAGNOSIS — J449 Chronic obstructive pulmonary disease, unspecified: Secondary | ICD-10-CM

## 2021-12-26 DIAGNOSIS — Z8719 Personal history of other diseases of the digestive system: Secondary | ICD-10-CM

## 2021-12-26 DIAGNOSIS — I25118 Atherosclerotic heart disease of native coronary artery with other forms of angina pectoris: Secondary | ICD-10-CM

## 2021-12-26 DIAGNOSIS — F633 Trichotillomania: Secondary | ICD-10-CM

## 2021-12-26 DIAGNOSIS — Z8619 Personal history of other infectious and parasitic diseases: Secondary | ICD-10-CM

## 2021-12-26 DIAGNOSIS — M2550 Pain in unspecified joint: Secondary | ICD-10-CM

## 2021-12-26 DIAGNOSIS — M5136 Other intervertebral disc degeneration, lumbar region: Secondary | ICD-10-CM

## 2021-12-26 DIAGNOSIS — M4326 Fusion of spine, lumbar region: Secondary | ICD-10-CM

## 2021-12-26 DIAGNOSIS — E7800 Pure hypercholesterolemia, unspecified: Secondary | ICD-10-CM

## 2021-12-26 DIAGNOSIS — E109 Type 1 diabetes mellitus without complications: Secondary | ICD-10-CM

## 2021-12-26 DIAGNOSIS — I7090 Unspecified atherosclerosis: Secondary | ICD-10-CM

## 2021-12-26 DIAGNOSIS — Z8659 Personal history of other mental and behavioral disorders: Secondary | ICD-10-CM

## 2021-12-26 DIAGNOSIS — M65311 Trigger thumb, right thumb: Secondary | ICD-10-CM

## 2022-01-10 ENCOUNTER — Ambulatory Visit
Admission: RE | Admit: 2022-01-10 | Discharge: 2022-01-10 | Disposition: A | Discharge status: Home / Self Care | Payer: BC Managed Care – PPO | Source: Ambulatory Visit | Attending: Obstetrics and Gynecology | Admitting: Obstetrics and Gynecology

## 2022-01-10 DIAGNOSIS — N644 Mastodynia: Secondary | ICD-10-CM

## 2022-01-14 ENCOUNTER — Ambulatory Visit: Payer: BC Managed Care – PPO | Attending: Cardiology | Admitting: Cardiology

## 2022-01-14 ENCOUNTER — Encounter: Payer: Self-pay | Admitting: Cardiology

## 2022-01-14 VITALS — BP 110/70 | HR 67 | Ht 62.0 in | Wt 148.0 lb

## 2022-01-14 DIAGNOSIS — Z720 Tobacco use: Secondary | ICD-10-CM | POA: Diagnosis not present

## 2022-01-14 DIAGNOSIS — J449 Chronic obstructive pulmonary disease, unspecified: Secondary | ICD-10-CM | POA: Diagnosis not present

## 2022-01-14 DIAGNOSIS — I25118 Atherosclerotic heart disease of native coronary artery with other forms of angina pectoris: Secondary | ICD-10-CM | POA: Diagnosis not present

## 2022-01-14 DIAGNOSIS — E119 Type 2 diabetes mellitus without complications: Secondary | ICD-10-CM | POA: Diagnosis not present

## 2022-01-14 DIAGNOSIS — E785 Hyperlipidemia, unspecified: Secondary | ICD-10-CM

## 2022-01-14 MED ORDER — ASPIRIN 81 MG PO TBEC: 81.0000 mg | DELAYED_RELEASE_TABLET | Freq: Every day | ORAL | 3 refills | Status: AC

## 2022-01-14 MED ORDER — ASPIRIN 81 MG PO TBEC: 81.0000 mg | DELAYED_RELEASE_TABLET | Freq: Every day | ORAL | 3 refills | Status: DC

## 2022-01-14 NOTE — Progress Notes (Signed)
Cardiology Office Note:    Date:  01/14/2022   ID:  [name], DOB February 17, 1961, MRN [id number]  PCP:  [name], MD  Cardiologist:  Jenne Campus, MD    Referring MD: [name], MD   Chief Complaint  Patient presents with   Follow-up    History of Present Illness:    [name] is a 61 y.o. female  with past medical history significant for coronary artery disease.  In 2019 she did have cardiac catheterization performed which showed 30% stenosis of the ostial LAD.  Otherwise there was normal anatomy.  In summer 2021 she got coronary CT angio performed which showed coronary calcium score 253 she was find to have moderate 50 to 69% stenosis of the proximal LAD.  To look like fractional flow reserve was not performed after that.  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  No significant disease noted in the left circumflex or RCA.  Since that time she has been doing very well.  She denies have any chest pain tightness squeezing pressure burning chest.  Of course concerns risk factors modifications for example and find out that she still does not take aspirin.  And she still smokes.  Past Medical History:  Diagnosis Date   Abnormal thyroid function test 12/06/2015   Accelerating angina ([place]) 05/06/2021   Acute bilateral thoracic back pain 12/03/2018   Acute left ankle pain 03/09/2020   Alcohol use 04/15/2019   Formatting of this note might be different from the original. Recommend abstinence  from alcohol   Anxiety disorder 06/05/2015   Last Assessment & Plan:  Formatting of this note might be different from the original. Had started her on some xanax about week ago and she feels it has helped her and she is taking about half tablet for this and will continue with it as she has been compliant with it   Arteriosclerotic vascular disease 11/06/2017   Arthritis    back    Atherosclerosis 06/05/2015   Formatting of this note might be different from the original. Seen on CTs of chest.   Atherosclerosis of native coronary artery of native heart without angina pectoris 02/11/2016   Attention deficit 12/04/2017   Blurred vision 07/11/2020   BMI 27.0-27.9,adult 07/11/2015   Last Assessment & Plan:  Formatting of this note might be different from the original. Relevant Hx: Course: Daily Update: Today's Plan:difficult for her with the insulin as well as her abilify and the weight is increaseing for her rather than decreasing, she has cut her alcohol intake back, she has worked on her diet more with decreasing her carb intake as well and still difficulty discussed with    Bursitis of both hips 04/14/2018   CAD (coronary artery disease) 12/24/2017   Chest pain 11/06/2017   Chronic bronchitis ([place]) 12/04/2015   Formatting of this note might be different from the original. PFT ratio 88% FEV1 103% FVC 94% DLCO 76%  Last Assessment & Plan:  Formatting of this note might  be different from the original. We discussed this as she has been coughing again for about 2 mnths and she sounds congested on exam with previous ease for pneumonia will place her on levaquin and have reviewed with her the importance of her    Cigarette smoker 02/26/2016   Complex regional pain syndrome of lower limb 03/07/2021   Coronary arteriosclerosis 12/24/2017   Coronary artery disease of native artery of native heart with stable angina pectoris ([place]) 01/25/2016   Formatting of this note might be different from the original.  2017:  50% LAD lesion.  Calcium noted other places.  Heart cath December 31, 2017 with a 30% ostial LAD lesion otherwise normal coronary arteries seen with LVEF 55 to 65%   Deficiency of other specified B group vitamins 11/07/2016   Degeneration of lumbar intervertebral disc 11/06/2017   Diabetes mellitus due to underlying condition with unspecified complications ([place]) 70/96/2836   Diabetes mellitus type 1, uncomplicated ([place]) 62/94/7654   Added automatically from request for surgery [id number]  Formatting of this note might be different from the original. Added automatically from request for surgery [id number]   Diabetes mellitus without complication ([place])    Type 1- injections only- recent change to [place] with improved A1C reading.   Dysphagia 11/04/2017   Dyspnea on exertion 12/04/2017   Elevated alkaline phosphatase level 11/09/2020   Elevated LFTs 03/10/2019   Formatting of this note might be different from the original. History.  Cannot change to this.   Elevated parathyroid hormone 06/06/2021   Erythrocytosis 12/04/2017   Facial tingling 07/11/2020   Fatigue 11/06/2017   Fever 75/17/0017   Folic acid deficiency 49/44/9675   Fusion of lumbar spine 01/11/2020   HAP (hospital-acquired pneumonia) 01/13/2020   Hemangioma 11/06/2017   History of metabolic disorder 91/63/8466   History of pneumonia 01/25/2020   Formatting of this note might be different from the original. Recent mutifocal pneumonia September 2021 Formatting of this note might be different from the original. Formatting of this note might be different from the original. Recent mutifocal pneumonia September 2021   History of sepsis 01/25/2020   Formatting of this note might be different from the original. Recent septic syndrome with multifocal pneumonia Formatting of this note might be different from the original. Formatting of this note might be different from the original. Recent septic syndrome with multifocal pneumonia   HNP  (herniated nucleus pulposus), lumbar 05/10/2014   Hypotension 07/18/2020   Idiopathic peripheral neuropathy 08/29/2020   Insulin long-term use ([place]) 11/06/2017   Intermittent claudication ([place]) 12/04/2017   Irritable bowel syndrome 09/03/2020   Left foot pain 01/28/2018   Lipoma of abdominal wall 08/08/2020   Lipoma of left upper extremity 09/04/2020   Low back pain 10/22/2019   Lumbar post-laminectomy syndrome 03/04/2019   Lumbar radiculopathy 05/26/2017   Lung mass 12/04/2017   Malaise and fatigue 11/06/2017   Menopausal disorder 11/06/2017   Mixed hyperlipidemia 04/09/2016   Formatting of this note might be different from the original. Added automatically from request for surgery [id number]   Moderate chronic obstructive pulmonary disease ([place]) 11/06/2017   Multiple actinic keratoses 11/06/2017   Myositis 12/04/2017   Nicotine dependence, cigarettes, uncomplicated 01/77/9390   Obsessive-compulsive disorder 10/14/2019   Formatting of this note might be different from the original. Formerly followed with [facility].   Osteoporosis of multiple sites without pathological fracture 06/06/2021   Overweight with body mass index (BMI) 25.0-29.9 07/11/2015   Last Assessment & Plan:  Relevant Hx: Course: Daily  Update: Today's Plan:difficult for her with the insulin as well as her abilify and the weight is increaseing for her rather than decreasing, she has cut her alcohol intake back, she has worked on her diet more with decreasing her carb intake as well and still difficulty discussed with the saxenda and will try to get this approved for her and see    Pain in both lower extremities 07/10/2015   Last Assessment & Plan:  Formatting of this note might be different from the original. Relevant Hx: Course: Daily Update: Today's Plan:she had pain to her right anterior thigh there is edema of both legs and she has fear for DVT which she is going to have [place] of and confirm ok, she has increased her weight which  is part of her edema I believe as well as her insulin usage. She is advised as well tha   Pain of left hip joint 07/23/2018   Pelvic pain 12/14/2020   Personal history of (healed) traumatic fracture 12/31/2016   Pneumonia    Polyarthralgia 06/11/2018   Primary insomnia 06/05/2015   Primary osteoarthritis involving multiple joints 08/24/2019   Pure hypercholesterolemia 02/11/2016   Purpura ([place]) 11/06/2017   Respiratory failure ([place]) 11/2018   RUQ pain 11/09/2020   S/P lumbar laminectomy 09/18/2021   Sciatica 11/03/2017   Scoliosis deformity of spine 11/03/2017   Secondary diabetes mellitus ([place]) 12/24/2017   Senile purpura ([place]) 12/04/2017   Sepsis ([place]) 07/18/2020   Smoking greater than 30 pack years 02/26/2016   Tobacco abuse 02/11/2016   Tobacco user 02/11/2016   Trichotillomania    hx.    Trigger thumb of right hand 41/32/4401   Uncomplicated type 1 diabetes mellitus ([place]) 10/22/2020    Past Surgical History:  Procedure Laterality Date   ABDOMINAL EXPOSURE N/A 01/11/2020   Procedure: ABDOMINAL EXPOSURE;  Surgeon: Marty Heck, MD;  Location: [facility] OR;  Service: Vascular;  Laterality: N/A;   ANTERIOR LUMBAR FUSION N/A 01/11/2020   Procedure: ANTERIOR LUMBAR INTERBODY FUSION  LUMBAR FIVE-SACRAL ONE, LEFT LUMBAR TWO-THREE FORAMINOTOMY, DISECTOMY;  Surgeon: Melina Schools, MD;  Location: [facility];  Service: Orthopedics;  Laterality: N/A;   APPENDECTOMY  04/28/1970   BACK SURGERY  2016   CARDIAC CATHETERIZATION  12/31/2017   COLONOSCOPY  04/28/2017   EXCISION/RELEASE BURSA HIP Left 04/14/2018   Procedure: Excision trochanteric bursa left hip;  Surgeon: Susa Day, MD;  Location: [facility];  Service: Orthopedics;  Laterality: Left;  60 mins   HAND SURGERY     HIP OPEN REDUCTION Right    ORIF   INTRAVASCULAR PRESSURE WIRE/FFR STUDY N/A 05/06/2021   Procedure: INTRAVASCULAR PRESSURE WIRE/FFR STUDY;  Surgeon: Nelva Bush, MD;  Location: [facility];  Service:  Cardiovascular;  Laterality: N/A;   KNEE ARTHROSCOPY     LEFT HEART CATH AND CORONARY ANGIOGRAPHY N/A 12/31/2017   Procedure: LEFT HEART CATH AND CORONARY ANGIOGRAPHY;  Surgeon: Martinique, Peter M, MD;  Location: [facility];  Service: Cardiovascular;  Laterality: N/A;   LEFT HEART CATH AND CORONARY ANGIOGRAPHY N/A 05/06/2021   Procedure: LEFT HEART CATH AND CORONARY ANGIOGRAPHY;  Surgeon: Nelva Bush, MD;  Location: [facility];  Service: Cardiovascular;  Laterality: N/A;   LUMBAR LAMINECTOMY/DECOMPRESSION MICRODISCECTOMY Left 05/10/2014   Procedure: MICRODISCECTOMY LUMBAR DECOMPRESSION L5-S1 LEFT ;  Surgeon: Johnn Hai, MD;  Location: [facility];  Service: Orthopedics;  Laterality: Left;   LUMBAR LAMINECTOMY/DECOMPRESSION MICRODISCECTOMY N/A 01/11/2020   Procedure: LUMBAR LAMINECTOMY/DECOMPRESSION MICRODISCECTOMY LEFT LUMBAR TWO-THREE;  Surgeon: Melina Schools, MD;  Location: [facility];  Service: Orthopedics;  Laterality: N/A;   LUMBAR LAMINECTOMY/DECOMPRESSION MICRODISCECTOMY Right 09/18/2021   Procedure: Laminectomy and Foraminotomy - Lumbar Four-Lumbar Five - right;  Surgeon: Eustace Moore, MD;  Location: [facility];  Service: Neurosurgery;  Laterality: Right;  Laminectomy and Foraminotomy - Lumbar Four-Lumbar Five - right   ORIF WRIST FRACTURE Right    retained hardware   ORTHOPAEDIC SURGERY  04/14/2018   TUBAL LIGATION  12/25/1995    Current Medications: Current Meds  Medication Sig   acetaminophen (TYLENOL) 500 MG tablet Take 500 mg by mouth daily as needed for mild pain.   ALPRAZolam (XANAX) 0.5 MG tablet Take 0.5 mg by mouth at bedtime.    cyanocobalamin (,VITAMIN B-12,) 1000 MCG/ML injection Inject 1,000 mcg into the muscle every 14 (fourteen) days.   finasteride (PROSCAR) 5 MG tablet Take 2.5 mg by mouth every evening.   FLUoxetine (PROZAC) 40 MG capsule Take 80 mg by mouth every evening.   folic acid (FOLVITE) 1 MG tablet Take 1 mg by mouth at bedtime.    FORTEO 600  MCG/2.4ML SOPN Inject 20 mcg into the skin daily. DISCARD AFTER 28 days of use   furosemide (LASIX) 40 MG tablet Take 20-40 mg by mouth daily as needed for fluid.   insulin aspart (NOVOLOG FLEXPEN) 100 UNIT/ML FlexPen Inject 0-10 Units into the skin 3 (three) times daily with meals. Per sliding scale   insulin degludec (TRESIBA FLEXTOUCH) 200 UNIT/ML FlexTouch Pen Inject 32 Units into the skin in the morning.   Insulin Pen Needle 31G X 5 MM MISC Use to inject Forteo once daily (Patient taking differently: 1 each by Other route See admin instructions. Use to inject Forteo once daily)   linaclotide (LINZESS) 290 MCG CAPS capsule Take 290 mcg by mouth at bedtime.    metoprolol tartrate (LOPRESSOR) 25 MG tablet Take 1 tablet (25 mg total) by mouth 2 (two) times daily.   nitroGLYCERIN (NITROSTAT) 0.4 MG SL tablet Place 1 tablet (0.4 mg total) under the tongue every 5 (five) minutes as needed for chest pain.   oxyCODONE-acetaminophen (PERCOCET) 5-325 MG tablet Take 1 tablet by mouth every 4 (four) hours as needed for severe pain.   pantoprazole (PROTONIX) 40 MG tablet Take 40 mg by mouth at bedtime.   Semaglutide,0.25 or 0.'5MG'$ /DOS, (OZEMPIC, 0.25 OR 0.5 MG/DOSE,) 2 MG/3ML SOPN Inject 0.5 mg into the skin every Wednesday.   traMADol (ULTRAM) 50 MG tablet Take 50 mg by mouth daily as needed for moderate pain.     Allergies:   Celebrex [celecoxib] and Mobic [meloxicam]   Social History   Socioeconomic History   Marital status: Divorced    Spouse name: Not on file   Number of children: Not on file   Years of education: Not on file   Highest education level: Not on file  Occupational History   Not on file  Tobacco Use   Smoking status: Every Day    Packs/day: 0.50    Years: 42.00    Total pack years: 21.00    Types: Cigarettes    Passive exposure: Current   Smokeless tobacco: Never  Vaping Use   Vaping Use: Never used  Substance and Sexual Activity   Alcohol use: Yes    Alcohol/week: 6.0  standard drinks of alcohol    Types: 6 Cans of beer per week    Comment: weekends - social   Drug use: No   Sexual activity: Not Currently  Other Topics Concern   Not on file  Social History Narrative   Not on file   Social Determinants of Health   Financial Resource Strain: Not on file  Food Insecurity: Not on file  Transportation Needs: Not on file  Physical Activity: Not on file  Stress: Not on file  Social Connections: Not on file     Family History: The patient's family history includes Cancer in her mother; Diabetes in her father, mother, and son; Healthy in her brother, daughter, and son; Heart disease in her father; Hypertension in her father. ROS:   Please see the history of present illness.    All 14 point review of systems negative except as described per history of present illness  EKGs/Labs/Other Studies Reviewed:      Recent Labs: 06/04/2021: TSH 0.64 09/11/2021: Platelets 219 10/22/2021: ALT 23; BUN 13; Creatinine, Ser 0.64; Hemoglobin 13.5; Potassium 4.1; Sodium 141  Recent Lipid Panel    Component Value Date/Time   CHOL 186 10/11/2019 0950   TRIG 136 10/11/2019 0950   HDL 49 10/11/2019 0950   CHOLHDL 3.8 10/11/2019 0950   LDLCALC 113 (H) 10/11/2019 0950    Physical Exam:    VS:  BP 110/70 (BP Location: Left Arm, Patient Position: Sitting)   Pulse 67   Ht '5\' 2"'$  (1.575 m)   Wt 148 lb (67.1 kg)   LMP 05/04/2008   SpO2 97%   BMI 27.07 kg/m     Wt Readings from Last 3 Encounters:  01/14/22 148 lb (67.1 kg)  11/27/21 147 lb (66.7 kg)  10/22/21 152 lb 12.8 oz (69.3 kg)     GEN:  Well nourished, well developed in no acute distress HEENT: Normal NECK: No JVD; No carotid bruits LYMPHATICS: No lymphadenopathy CARDIAC: RRR, no murmurs, no rubs, no gallops RESPIRATORY:  Clear to auscultation without rales, wheezing or rhonchi  ABDOMEN: Soft, non-tender, non-distended MUSCULOSKELETAL:  No edema; No deformity  SKIN: Warm and dry LOWER EXTREMITIES:  no swelling NEUROLOGIC:  Alert and oriented x 3 PSYCHIATRIC:  Normal affect   ASSESSMENT:    1. Coronary artery disease of native artery of native heart with stable angina pectoris ([place])   2. Moderate chronic obstructive pulmonary disease ([place])   3. Diabetes mellitus without complication ([place])   4. Tobacco abuse    PLAN:    In order of problems listed above:  Coronary disease stable doing well and I stressed importance of taking aspirin I explained to her the rationale for taking aspirin hopefully she will take it. COPD related to smoking we spent at least 10 minutes talking about this I strongly recommended to quit. Diabetes that being followed by internal medicine team but her hemoglobin A1c is still elevated the last number I have is from May which is 7.3 that need to be better controlled. Dyslipidemia I did review K PN and data from primary care physician her LDL is 126 that was checked 3 months ago..  We will roll her to our lipid clinic to talk about PCSK9 agent Lady with moderate disease involving distal left main and proximal LAD and told her if she will occlude this artery she may even diet, I also told her if this became hemodynamically significant probably the way to fix it will be to do bypass surgery which obviously is significant surgery.  Hopefully she will listen hopefully she will be able to quit smoking hopefully will get her cholesterol better under control   Medication Adjustments/Labs and Tests Ordered:  Current medicines are reviewed at length with the patient today.  Concerns regarding medicines are outlined above.  No orders of the defined types were placed in this encounter.  Medication changes: No orders of the defined types were placed in this encounter.   Signed, Park Liter, MD, [facility] 01/14/2022 4:22 PM    [place]

## 2022-01-14 NOTE — Patient Instructions (Signed)
Medication Instructions:  Your physician has recommended you make the following change in your medication:   Start: Enteric Coated '81mg'$  Aspirin 1 tablet daily    Lab Work: None Ordered If you have labs (blood work) drawn today and your tests are completely normal, you will receive your results only by: MyChart Message (if you have MyChart) OR A paper copy in the mail If you have any lab test that is abnormal or we need to change your treatment, we will call you to review the results.   Testing/Procedures: None Ordered   Follow-Up: At [facility], you and your health needs are our priority.  As part of our continuing mission to provide you with exceptional heart care, we have created designated Provider Care Teams.  These Care Teams include your primary Cardiologist (physician) and Advanced Practice Providers (APPs -  Physician Assistants and Nurse Practitioners) who all work together to provide you with the care you need, when you need it.  We recommend signing up for the patient portal called "MyChart".  Sign up information is provided on this After Visit Summary.  MyChart is used to connect with patients for Virtual Visits (Telemedicine).  Patients are able to view lab/test results, encounter notes, upcoming appointments, etc.  Non-urgent messages can be sent to your provider as well.   To learn more about what you can do with MyChart, go to [email or web address].    Your next appointment:   6 month(s)  The format for your next appointment:   In Person  Provider:   Jenne Campus, MD    Other Instructions Referral to [facility]- They will call for appt

## 2022-02-06 ENCOUNTER — Other Ambulatory Visit: Payer: Self-pay | Admitting: Rheumatology

## 2022-02-06 ENCOUNTER — Encounter: Payer: Self-pay | Admitting: Cardiology

## 2022-02-06 DIAGNOSIS — M810 Age-related osteoporosis without current pathological fracture: Secondary | ICD-10-CM

## 2022-02-06 NOTE — Telephone Encounter (Signed)
Please schedule patient a follow up visit. Patient due August . Thanks!     Follow-Up Instructions: Return in 4 weeks (on 12/25/2021) for Osteoporosis, DDD.

## 2022-02-06 NOTE — Telephone Encounter (Signed)
Next Visit: Due August 2023. Message sent to the front to schedule.   Last Visit: 11/27/2021  Last Fill: 09/26/2021  DX: Age-related osteoporosis without current pathological fracture  Current Dose per office note 11/27/2021: Patient was started on Forteo daily injections on 09/30/21.   Labs: 10/22/2021 Glucose 141, Total Protein 5.9   Okay to refill Forteo?

## 2022-02-17 NOTE — Progress Notes (Signed)
Office Visit Note  Patient: [name]             Date of Birth: 04/12/1961           MRN: [id number]             PCP: [name], MD Referring: [name], MD Visit Date: 03/03/2022 Occupation: '@GUAROCC' @  Subjective:  Pain in multiple joints  History of Present Illness: CHAE SHUSTER is a 61 y.o. female with history of osteoporosis and DDD.  Patient is currently on Forteo daily injections for the management of osteoporosis.  She initiated Forteo in June 2023.  She is tolerating Forteo without any side effects or injection site reactions.  She has not missed any doses recently.  She denies any falls or fractures.  Patient reports that she will be starting on Repatha for management of hyperlipidemia.  She denies any other new medical conditions since her last office visit.  She continues to have persistent pain in both shoulders, both knees, and both feet.  She states that her knees have been causing the most discomfort out of any of her joints recently.  She denies any joint swelling.  Has been taking Tylenol sparingly.  She describes the discomfort as an aching sensation.  She has not yet followed up with her orthopedist at [facility].  She has had injections of cortisone in the past which have provided temporary relief.  She has not had Visco gel injections previously.    Activities of Daily Living:  Patient reports morning stiffness for 30 minutes.   Patient Reports nocturnal pain.  Difficulty dressing/grooming: Denies Difficulty climbing stairs: Denies Difficulty getting out of chair: Denies Difficulty using hands for taps, buttons, cutlery, and/or writing: Denies  Review of Systems  Constitutional:  Negative for fatigue.  HENT:  Negative for mouth sores and mouth dryness.   Eyes:  Positive for visual disturbance. Negative for dryness.  Respiratory:  Negative for shortness of breath.   Cardiovascular:  Negative for chest pain and palpitations.   Gastrointestinal:  Negative for blood in stool, constipation and diarrhea.  Endocrine: Negative for increased urination.  Genitourinary:  Negative for involuntary urination.  Musculoskeletal:  Positive for joint pain, joint pain, myalgias, morning stiffness, muscle tenderness and myalgias. Negative for gait problem, joint swelling and muscle weakness.  Skin:  Negative for color change, rash and sensitivity to sunlight.  Allergic/Immunologic: Negative for susceptible to infections.  Neurological:  Negative for dizziness and headaches.  Hematological:  Negative for swollen glands.  Psychiatric/Behavioral:  Positive for sleep disturbance. Negative for depressed mood. The patient is not nervous/anxious.     PMFS History:  Patient Active Problem List   Diagnosis Date Noted   Osteoarthritis of right knee 12/07/2021   Dermatographic urticaria 10/18/2021   S/P lumbar laminectomy 09/18/2021   Elevated parathyroid hormone 06/06/2021   Osteoporosis of multiple sites without pathological fracture 06/06/2021   Accelerating angina ([place]) 05/06/2021   Complex regional pain syndrome of lower limb 03/07/2021   Pelvic pain 12/14/2020   RUQ pain 11/09/2020   Elevated alkaline phosphatase level 35/70/1779   Uncomplicated type 1 diabetes mellitus ([place]) 10/22/2020   Lipoma of left upper extremity 09/04/2020   Irritable bowel syndrome 09/03/2020   Idiopathic peripheral neuropathy 08/29/2020   Lipoma of abdominal wall 08/08/2020   Fever 07/18/2020   Hypotension 07/18/2020   Sepsis ([place]) 07/18/2020   Pneumonia 07/18/2020   Arthritis 07/18/2020   Blurred vision 07/11/2020   Facial tingling  07/11/2020   Diabetes mellitus without complication (HCC)    Acute left ankle pain 03/09/2020   History of pneumonia 01/25/2020   History of sepsis 01/25/2020   Respiratory failure ([place]) 01/13/2020   HAP (hospital-acquired pneumonia) 01/13/2020   Fusion of lumbar spine 01/11/2020   Trigger thumb of right hand  11/14/2019   Low back pain 10/22/2019   Obsessive-compulsive disorder 10/14/2019   Diabetes mellitus due to underlying condition with unspecified complications ([place]) 26/33/3545   Primary osteoarthritis involving multiple joints 08/24/2019   Alcohol use 04/15/2019   Elevated LFTs 03/10/2019   Lumbar post-laminectomy syndrome 03/04/2019   Acute bilateral thoracic back pain 12/03/2018   Pain of left hip joint 07/23/2018   Polyarthralgia 06/11/2018   Bursitis of both hips 04/14/2018   Left foot pain 01/28/2018   CAD (coronary artery disease) 12/24/2017   Secondary diabetes mellitus ([place]) 12/24/2017   Coronary arteriosclerosis 12/24/2017   Dyspnea on exertion 12/04/2017   Attention deficit 12/04/2017   Intermittent claudication ([place]) 12/04/2017   History of metabolic disorder 62/56/3893   Lung mass 12/04/2017   Myositis 12/04/2017   Erythrocytosis 12/04/2017   Senile purpura ([place]) 12/04/2017   Chest pain 11/06/2017   Multiple actinic keratoses 11/06/2017   Degeneration of lumbar intervertebral disc 11/06/2017   Fatigue 11/06/2017   Insulin long-term use ([place]) 11/06/2017   Menopausal disorder 11/06/2017   Purpura ([place]) 11/06/2017   Moderate chronic obstructive pulmonary disease ([place]) 73/42/8768   Folic acid deficiency 11/57/2620   Malaise and fatigue 11/06/2017   Arteriosclerotic vascular disease 11/06/2017   Dysphagia 11/04/2017   Sciatica 11/03/2017   Scoliosis deformity of spine 11/03/2017   Lumbar radiculopathy 05/26/2017   Personal history of (healed) traumatic fracture 12/31/2016   Deficiency of other specified B group vitamins 11/07/2016   Mixed hyperlipidemia 04/09/2016   Smoking greater than 30 pack years 02/26/2016   Cigarette smoker 02/26/2016   Nicotine dependence, cigarettes, uncomplicated 35/59/7416   Diabetes mellitus type 1, uncomplicated ([place]) 38/45/3646   Tobacco abuse 02/11/2016   Tobacco user 02/11/2016   Pure hypercholesterolemia 02/11/2016    Atherosclerosis of native coronary artery of native heart without angina pectoris 02/11/2016   Coronary artery disease of native artery of native heart with stable angina pectoris ([place]) 01/25/2016   Abnormal thyroid function test 12/06/2015   Chronic bronchitis ([place]) 12/04/2015   Overweight with body mass index (BMI) 25.0-29.9 07/11/2015   BMI 27.0-27.9,adult 07/11/2015   Pain in both lower extremities 07/10/2015   Anxiety disorder 06/05/2015   Atherosclerosis 06/05/2015   Hemangioma 06/05/2015   Primary insomnia 06/05/2015   Trichotillomania 06/05/2015   HNP (herniated nucleus pulposus), lumbar 05/10/2014    Past Medical History:  Diagnosis Date   Abnormal thyroid function test 12/06/2015   Accelerating angina ([place]) 05/06/2021   Acute bilateral thoracic back pain 12/03/2018   Acute left ankle pain 03/09/2020   Alcohol use 04/15/2019   Formatting of this note might be different from the original. Recommend abstinence from alcohol   Anxiety disorder 06/05/2015   Last Assessment & Plan:  Formatting of this note might be different from the original. Had started her on some xanax about week ago and she feels it has helped her and she is taking about half tablet for this and will continue with it as she has been compliant with it   Arteriosclerotic vascular disease 11/06/2017   Arthritis    back    Atherosclerosis 06/05/2015   Formatting of this note might be different from the original.  Seen on CTs of chest.   Atherosclerosis of native coronary artery of native heart without angina pectoris 02/11/2016   Attention deficit 12/04/2017   Blurred vision 07/11/2020   BMI 27.0-27.9,adult 07/11/2015   Last Assessment & Plan:  Formatting of this note might be different from the original. Relevant Hx: Course: Daily Update: Today's Plan:difficult for her with the insulin as well as her abilify and the weight is increaseing for her rather than decreasing, she has cut her alcohol intake back, she has  worked on her diet more with decreasing her carb intake as well and still difficulty discussed with    Bursitis of both hips 04/14/2018   CAD (coronary artery disease) 12/24/2017   Chest pain 11/06/2017   Chronic bronchitis ([place]) 12/04/2015   Formatting of this note might be different from the original. PFT ratio 88% FEV1 103% FVC 94% DLCO 76%  Last Assessment & Plan:  Formatting of this note might be different from the original. We discussed this as she has been coughing again for about 2 mnths and she sounds congested on exam with previous ease for pneumonia will place her on levaquin and have reviewed with her the importance of her    Cigarette smoker 02/26/2016   Complex regional pain syndrome of lower limb 03/07/2021   Coronary arteriosclerosis 12/24/2017   Coronary artery disease of native artery of native heart with stable angina pectoris ([place]) 01/25/2016   Formatting of this note might be different from the original. 2017:  50% LAD lesion.  Calcium noted other places.  Heart cath December 31, 2017 with a 30% ostial LAD lesion otherwise normal coronary arteries seen with LVEF 55 to 65%   Deficiency of other specified B group vitamins 11/07/2016   Degeneration of lumbar intervertebral disc 11/06/2017   Diabetes mellitus due to underlying condition with unspecified complications ([place]) 72/62/0355   Diabetes mellitus type 1, uncomplicated ([place]) 97/41/6384   Added automatically from request for surgery [id number]  Formatting of this note might be different from the original. Added automatically from request for surgery [id number]   Diabetes mellitus without complication ([place])    Type 1- injections only- recent change to [place] with improved A1C reading.   Dysphagia 11/04/2017   Dyspnea on exertion 12/04/2017   Elevated alkaline phosphatase level 11/09/2020   Elevated LFTs 03/10/2019   Formatting of this note might be different from the original. History.  Cannot change to this.   Elevated  parathyroid hormone 06/06/2021   Erythrocytosis 12/04/2017   Facial tingling 07/11/2020   Fatigue 11/06/2017   Fever 48/25/0037   Folic acid deficiency 04/88/8916   Fusion of lumbar spine 01/11/2020   HAP (hospital-acquired pneumonia) 01/13/2020   Hemangioma 11/06/2017   History of metabolic disorder 94/50/3888   History of pneumonia 01/25/2020   Formatting of this note might be different from the original. Recent mutifocal pneumonia September 2021 Formatting of this note might be different from the original. Formatting of this note might be different from the original. Recent mutifocal pneumonia September 2021   History of sepsis 01/25/2020   Formatting of this note might be different from the original. Recent septic syndrome with multifocal pneumonia Formatting of this note might be different from the original. Formatting of this note might be different from the original. Recent septic syndrome with multifocal pneumonia   HNP (herniated nucleus pulposus), lumbar 05/10/2014   Hypotension 07/18/2020   Idiopathic peripheral neuropathy 08/29/2020   Insulin long-term use ([place]) 11/06/2017   Intermittent claudication ([place]) 12/04/2017  Irritable bowel syndrome 09/03/2020   Left foot pain 01/28/2018   Lipoma of abdominal wall 08/08/2020   Lipoma of left upper extremity 09/04/2020   Low back pain 10/22/2019   Lumbar post-laminectomy syndrome 03/04/2019   Lumbar radiculopathy 05/26/2017   Lung mass 12/04/2017   Malaise and fatigue 11/06/2017   Menopausal disorder 11/06/2017   Mixed hyperlipidemia 04/09/2016   Formatting of this note might be different from the original. Added automatically from request for surgery [id number]   Moderate chronic obstructive pulmonary disease ([place]) 11/06/2017   Multiple actinic keratoses 11/06/2017   Myositis 12/04/2017   Nicotine dependence, cigarettes, uncomplicated 92/42/6834   Obsessive-compulsive disorder 10/14/2019   Formatting of this note might be different  from the original. Formerly followed with [facility].   Osteoporosis of multiple sites without pathological fracture 06/06/2021   Overweight with body mass index (BMI) 25.0-29.9 07/11/2015   Last Assessment & Plan:  Relevant Hx: Course: Daily Update: Today's Plan:difficult for her with the insulin as well as her abilify and the weight is increaseing for her rather than decreasing, she has cut her alcohol intake back, she has worked on her diet more with decreasing her carb intake as well and still difficulty discussed with the saxenda and will try to get this approved for her and see    Pain in both lower extremities 07/10/2015   Last Assessment & Plan:  Formatting of this note might be different from the original. Relevant Hx: Course: Daily Update: Today's Plan:she had pain to her right anterior thigh there is edema of both legs and she has fear for DVT which she is going to have [place] of and confirm ok, she has increased her weight which is part of her edema I believe as well as her insulin usage. She is advised as well tha   Pain of left hip joint 07/23/2018   Pelvic pain 12/14/2020   Personal history of (healed) traumatic fracture 12/31/2016   Pneumonia    Polyarthralgia 06/11/2018   Primary insomnia 06/05/2015   Primary osteoarthritis involving multiple joints 08/24/2019   Pure hypercholesterolemia 02/11/2016   Purpura ([place]) 11/06/2017   Respiratory failure ([place]) 11/2018   RUQ pain 11/09/2020   S/P lumbar laminectomy 09/18/2021   Sciatica 11/03/2017   Scoliosis deformity of spine 11/03/2017   Secondary diabetes mellitus ([place]) 12/24/2017   Senile purpura ([place]) 12/04/2017   Sepsis ([place]) 07/18/2020   Smoking greater than 30 pack years 02/26/2016   Tobacco abuse 02/11/2016   Tobacco user 02/11/2016   Trichotillomania    hx.    Trigger thumb of right hand 19/62/2297   Uncomplicated type 1 diabetes mellitus ([place]) 10/22/2020    Family History  Problem Relation Age of Onset   Diabetes Mother     Cancer Mother    Hypertension Father    Heart disease Father    Diabetes Father    Healthy Brother    Healthy Son    Diabetes Son    Healthy Daughter    Past Surgical History:  Procedure Laterality Date   ABDOMINAL EXPOSURE N/A 01/11/2020   Procedure: ABDOMINAL EXPOSURE;  Surgeon: Marty Heck, MD;  Location: [facility];  Service: Vascular;  Laterality: N/A;   ANTERIOR LUMBAR FUSION N/A 01/11/2020   Procedure: ANTERIOR LUMBAR INTERBODY FUSION  LUMBAR FIVE-SACRAL ONE, LEFT LUMBAR TWO-THREE FORAMINOTOMY, DISECTOMY;  Surgeon: Melina Schools, MD;  Location: [facility];  Service: Orthopedics;  Laterality: N/A;   APPENDECTOMY  04/28/1970   BACK SURGERY  2016   CARDIAC  CATHETERIZATION  12/31/2017   COLONOSCOPY  04/28/2017   EXCISION/RELEASE BURSA HIP Left 04/14/2018   Procedure: Excision trochanteric bursa left hip;  Surgeon: Susa Day, MD;  Location: [facility];  Service: Orthopedics;  Laterality: Left;  60 mins   HAND SURGERY     HIP OPEN REDUCTION Right    ORIF   INTRAVASCULAR PRESSURE WIRE/FFR STUDY N/A 05/06/2021   Procedure: INTRAVASCULAR PRESSURE WIRE/FFR STUDY;  Surgeon: Nelva Bush, MD;  Location: [facility];  Service: Cardiovascular;  Laterality: N/A;   KNEE ARTHROSCOPY     LEFT HEART CATH AND CORONARY ANGIOGRAPHY N/A 12/31/2017   Procedure: LEFT HEART CATH AND CORONARY ANGIOGRAPHY;  Surgeon: Martinique, Peter M, MD;  Location: [facility];  Service: Cardiovascular;  Laterality: N/A;   LEFT HEART CATH AND CORONARY ANGIOGRAPHY N/A 05/06/2021   Procedure: LEFT HEART CATH AND CORONARY ANGIOGRAPHY;  Surgeon: Nelva Bush, MD;  Location: [facility];  Service: Cardiovascular;  Laterality: N/A;   LUMBAR LAMINECTOMY/DECOMPRESSION MICRODISCECTOMY Left 05/10/2014   Procedure: MICRODISCECTOMY LUMBAR DECOMPRESSION L5-S1 LEFT ;  Surgeon: Johnn Hai, MD;  Location: [facility];  Service: Orthopedics;  Laterality: Left;   LUMBAR LAMINECTOMY/DECOMPRESSION MICRODISCECTOMY N/A  01/11/2020   Procedure: LUMBAR LAMINECTOMY/DECOMPRESSION MICRODISCECTOMY LEFT LUMBAR TWO-THREE;  Surgeon: Melina Schools, MD;  Location: [facility];  Service: Orthopedics;  Laterality: N/A;   LUMBAR LAMINECTOMY/DECOMPRESSION MICRODISCECTOMY Right 09/18/2021   Procedure: Laminectomy and Foraminotomy - Lumbar Four-Lumbar Five - right;  Surgeon: Eustace Moore, MD;  Location: [facility];  Service: Neurosurgery;  Laterality: Right;  Laminectomy and Foraminotomy - Lumbar Four-Lumbar Five - right   ORIF WRIST FRACTURE Right    retained hardware   ORTHOPAEDIC SURGERY  04/14/2018   TUBAL LIGATION  12/25/1995   Social History   Social History Narrative   ** Merged History Encounter **       Immunization History  Administered Date(s) Administered   Influenza,inj,Quad PF,6+ Mos 01/21/2016, 12/31/2016, 02/09/2018, 01/18/2021   Influenza,inj,quad, With Preservative 12/27/2016   Influenza-Unspecified 01/21/2016, 12/31/2016   Moderna Sars-Covid-2 Vaccination 04/29/2019, 05/30/2019   Pneumococcal Conjugate-13 07/23/2017   Pneumococcal Polysaccharide-23 02/01/2021     Objective: Vital Signs: BP 110/74 (BP Location: Left Arm, Patient Position: Sitting, Cuff Size: Normal)   Pulse 60   Resp 17   Ht '5\' 2"'  (1.575 m)   Wt 149 lb (67.6 kg)   LMP 05/04/2008   BMI 27.25 kg/m    Physical Exam Vitals and nursing note reviewed.  Constitutional:      Appearance: She is well-developed.  HENT:     Head: Normocephalic and atraumatic.  Eyes:     Conjunctiva/sclera: Conjunctivae normal.  Cardiovascular:     Rate and Rhythm: Normal rate and regular rhythm.     Heart sounds: Normal heart sounds.  Pulmonary:     Effort: Pulmonary effort is normal.     Breath sounds: Normal breath sounds.  Abdominal:     General: Bowel sounds are normal.     Palpations: Abdomen is soft.  Musculoskeletal:     Cervical back: Normal range of motion.  Lymphadenopathy:     Cervical: No cervical adenopathy.  Skin:    General: Skin  is warm and dry.     Capillary Refill: Capillary refill takes less than 2 seconds.  Neurological:     Mental Status: She is alert and oriented to person, place, and time.  Psychiatric:        Behavior: Behavior normal.      Musculoskeletal  Exam: C-spine has good ROM.  Painful ROM of both shoulder joints.  Elbow joints, wrist joints, MCPs, PIPs, and DIPs good ROM with no synovitis.  Complete fist formation bilaterally.  PIP and DIP thickening. Hip joints have good ROM with no groin pain.  Knee joints have good ROM with no warmth or effusion on examination today. Ankle joints have good ROM with mild tenderness but no synovitis.  No synovitis of MTP joints.   CDAI Exam: CDAI Score: -- Patient Global: --; Provider Global: -- Swollen: --; Tender: -- Joint Exam 03/03/2022   No joint exam has been documented for this visit   There is currently no information documented on the homunculus. Go to the Rheumatology activity and complete the homunculus joint exam.  Investigation: No additional findings.  Imaging: No results found.  Recent Labs: Lab Results  Component Value Date   WBC 6.9 10/22/2021   HGB 13.5 10/22/2021   PLT 219 09/11/2021   NA 141 10/22/2021   K 4.1 10/22/2021   CL 102 10/22/2021   CO2 25 10/22/2021   GLUCOSE 141 (H) 10/22/2021   BUN 13 10/22/2021   CREATININE 0.64 10/22/2021   BILITOT 0.9 10/22/2021   ALKPHOS 109 10/22/2021   AST 22 10/22/2021   ALT 23 10/22/2021   PROT 5.9 (L) 10/22/2021   ALBUMIN 4.1 10/22/2021   CALCIUM 9.4 10/22/2021   GFRAA >60 01/21/2020    Speciality Comments: Avoid oral bisphosphonates-history of GER  Procedures:  No procedures performed Allergies: Celebrex [celecoxib] and Mobic [meloxicam]   Assessment / Plan:     Visit Diagnoses: Polyarthralgia: Lab work from 11/27/2021 was reviewed today in the office: ESR within normal limits, '14 3 3 ' eta negative, anti-CCP negative, RF negative, uric acid within normal limits, CRP within  normal limits, and ACE level normal.  She continues to experience intermittent arthralgias.  Her pain has been most severe in both shoulders, both knees, and both feet.  She has no synovitis on examination today.  She will follow-up in the office in 6 months or sooner if needed.  Pain and swelling of right knee: She has been under the care of [facility].  She has had cortisone injections in the past.  On examination she has good range of motion of the right knee with no warmth or effusion.  Discussed the possibility of proceeding with Visco gel injections in the future.  She will notify us if she would like for [place] to apply for Visco gel injections in the future or if she will follow back up at [facility].  Discussed that she will require updated x-rays of both knees prior to applying for Visco gel injections in the future.  Age-related osteoporosis without current pathological fracture - DXA 03/20/21: BMD LFN 0.672 with T-score -2.6. Patient was started on Forteo daily injections on 09/30/21.  She is tolerating Forteo daily injections without any side effects or injection site reactions.  She has not had any recent falls or fractures. Due to update DEXA in November 2024.  Trigger thumb of right hand - She gets cort[facility]one [facility] at [facility].    Trigger thumb, left thumb - Followed at [facility].  DDD (degenerative disc disease), lumbar - Status post discectomy L2-L3 and L4-L5.  L4-L5 laminectomy and microdiscectomy on Sep 18, 2021 by Dr. Ronnald Ramp.  Chronic pain.  Fusion of lumbar spine - Status post L5-S1 fusion September 2021 by Dr. Rolena Infante.  Sep 18, 2021 L4-L5 core decompression by Dr. Sherley Bounds.  Other medical  conditions are listed as follows:   Coronary artery disease of native artery of native heart with stable angina pectoris (HCC)  Moderate chronic obstructive pulmonary disease (HCC)  Atherosclerosis  Pure hypercholesterolemia  Diabetes mellitus type 1,  uncomplicated (HCC)  History of gastroesophageal reflux (GERD)  History of sepsis  Trichotillomania  History of anxiety  Nicotine dependence, cigarettes, uncomplicated  Multiple actinic keratoses  Orders: No orders of the defined types were placed in this encounter.  No orders of the defined types were placed in this encounter.   Follow-Up Instructions: Return in about 6 months (around 09/01/2022) for Osteoporosis, DDD.   Ofilia Neas, PA-C  Note - This record has been created using Dragon software.  Chart creation errors have been sought, but may not always  have been located. Such creation errors do not reflect on  the standard of medical care.

## 2022-02-24 ENCOUNTER — Encounter: Payer: Self-pay | Admitting: Pharmacist Clinician (PhC)/ Clinical Pharmacy Specialist

## 2022-02-24 ENCOUNTER — Ambulatory Visit
Payer: BC Managed Care – PPO | Attending: Internal Medicine | Admitting: Pharmacist Clinician (PhC)/ Clinical Pharmacy Specialist

## 2022-02-24 DIAGNOSIS — E782 Mixed hyperlipidemia: Secondary | ICD-10-CM

## 2022-02-24 NOTE — Assessment & Plan Note (Signed)
Patient with CAD and hyperlipidemia, most recent LDL at 126 despite compliance with atorvastatin.  We still need to achieve another 60% drop in LDL to get her to goal.  Reviewed options for lowering LDL cholesterol, specifically PCSK-9 inhibitors.  Discussed mechanisms of action, dosing, side effects and potential decreases in LDL cholesterol.  Also reviewed cost information and potential options for patient assistance.  Answered all patient questions.  Based on this information, patient agreeable to start PCSK-9i.  Will do prior authorization and once approved she will do one injection every 14 days.  Repeat cholesterol labs in January

## 2022-02-24 NOTE — Progress Notes (Unsigned)
02/24/2022 [name] [birth year] [id number]   HPI:  [name] is a 61 y.o. female patient of Dr Agustin Cree, who presents today for a lipid clinic evaluation.  See pertinent past medical history below.  She has been taking atorvastatin 80 mg for years without problem, but with recent findings of 50+% stenosis in her LAD, we need to be more aggressive at cholesterol lowering.    Past Medical History: CAD 50-60% stenosis of proximal LAD, 30% in ostial LAD; coronary calcium score 253 (96th percentile)  DM1 5/23 A1c 7.3, diagnosed at 15  COPD Recommend to quit smoking; on     Insurance Carrier:   [employer]      Current Medications:  atorvastatin 80 mg       Cholesterol Goals: LDL < 55   Intolerant/previously tried:  ezetimibe     Family history:  father died from [place] at 33; mother with HLD, now 53; brother healthy; son had DM1 (dx at 1), HLD  Diet: mix of home and eating out, not much for fried foods; more vegetables fresh or frozen, no fruit -DM; variety of meats; no regular snacking  Exercise: no regular exercise - does swim reglarliy in summer; has 2 torn rotator cuffs, torn meniscus ; 5 back surgeries over time; does yard and house work without issues; does get 10,000 steps/day      Lipid Panel:  09/2021: TC 200, TG 151,  HDL 51, LDL 126   Current Outpatient Medications  Medication Sig Dispense Refill   ALPRAZolam (XANAX) 0.5 MG tablet Take 0.5 mg by mouth at bedtime.      aspirin EC 81 MG tablet Take 1 tablet (81 mg total) by mouth daily. Swallow whole. 90 tablet 3   atorvastatin (LIPITOR) 80 MG tablet Take 80 mg by mouth daily.     cyanocobalamin (,VITAMIN B-12,) 1000 MCG/ML injection Inject 1,000 mcg into the muscle every 14 (fourteen) days.     finasteride (PROSCAR) 5 MG tablet Take 2.5 mg by mouth every evening.     FLUoxetine (PROZAC) 40 MG capsule Take 80 mg by mouth every evening.     folic acid (FOLVITE) 1 MG tablet Take 1 mg by mouth at  bedtime.      FORTEO 600 MCG/2.4ML SOPN INJECT 20 MCG SUBCUTANEOUSLY ONCE DAILY. REFRIGERATE AND DISCARD PEN 28 DAYS AFTER INITIAL USE. 2.4 mL 4   furosemide (LASIX) 40 MG tablet Take 20-40 mg by mouth daily as needed for fluid.     insulin aspart (NOVOLOG FLEXPEN) 100 UNIT/ML FlexPen Inject 0-10 Units into the skin 3 (three) times daily with meals. Per sliding scale     insulin degludec (TRESIBA FLEXTOUCH) 200 UNIT/ML FlexTouch Pen Inject 32 Units into the skin in the morning.     Insulin Pen Needle 31G X 5 MM MISC Use to inject Forteo once daily (Patient taking differently: 1 each by Other route See admin instructions. Use to inject Forteo once daily) 100 each 2   linaclotide (LINZESS) 290 MCG CAPS capsule Take 290 mcg by mouth at bedtime.      metoprolol tartrate (LOPRESSOR) 25 MG tablet Take 1 tablet (25 mg total) by mouth 2 (two) times daily. 180 tablet 3   nitroGLYCERIN (NITROSTAT) 0.4 MG SL tablet Place 1 tablet (0.4 mg total) under the tongue every 5 (five) minutes as needed for chest pain. 25 tablet 11   pantoprazole (PROTONIX) 40 MG tablet Take 40 mg by mouth at bedtime.  Semaglutide,0.25 or 0.'5MG'$ /DOS, (OZEMPIC, 0.25 OR 0.5 MG/DOSE,) 2 MG/3ML SOPN Inject 0.5 mg into the skin every Wednesday.     acetaminophen (TYLENOL) 500 MG tablet Take 500 mg by mouth daily as needed for mild pain.     No current facility-administered medications for this visit.    Allergies  Allergen Reactions   Celebrex [Celecoxib] Swelling    Face swelling   Mobic [Meloxicam] Swelling    Face swelling    Past Medical History:  Diagnosis Date   Abnormal thyroid function test 12/06/2015   Accelerating angina ([place]) 05/06/2021   Acute bilateral thoracic back pain 12/03/2018   Acute left ankle pain 03/09/2020   Alcohol use 04/15/2019   Formatting of this note might be different from the original. Recommend abstinence from alcohol   Anxiety disorder 06/05/2015   Last Assessment & Plan:  Formatting of this  note might be different from the original. Had started her on some xanax about week ago and she feels it has helped her and she is taking about half tablet for this and will continue with it as she has been compliant with it   Arteriosclerotic vascular disease 11/06/2017   Arthritis    back    Atherosclerosis 06/05/2015   Formatting of this note might be different from the original. Seen on CTs of chest.   Atherosclerosis of native coronary artery of native heart without angina pectoris 02/11/2016   Attention deficit 12/04/2017   Blurred vision 07/11/2020   BMI 27.0-27.9,adult 07/11/2015   Last Assessment & Plan:  Formatting of this note might be different from the original. Relevant Hx: Course: Daily Update: Today's Plan:difficult for her with the insulin as well as her abilify and the weight is increaseing for her rather than decreasing, she has cut her alcohol intake back, she has worked on her diet more with decreasing her carb intake as well and still difficulty discussed with    Bursitis of both hips 04/14/2018   CAD (coronary artery disease) 12/24/2017   Chest pain 11/06/2017   Chronic bronchitis ([place]) 12/04/2015   Formatting of this note might be different from the original. PFT ratio 88% FEV1 103% FVC 94% DLCO 76%  Last Assessment & Plan:  Formatting of this note might be different from the original. We discussed this as she has been coughing again for about 2 mnths and she sounds congested on exam with previous ease for pneumonia will place her on levaquin and have reviewed with her the importance of her    Cigarette smoker 02/26/2016   Complex regional pain syndrome of lower limb 03/07/2021   Coronary arteriosclerosis 12/24/2017   Coronary artery disease of native artery of native heart with stable angina pectoris ([place]) 01/25/2016   Formatting of this note might be different from the original. 2017:  50% LAD lesion.  Calcium noted other places.  Heart cath December 31, 2017 with a 30%  ostial LAD lesion otherwise normal coronary arteries seen with LVEF 55 to 65%   Deficiency of other specified B group vitamins 11/07/2016   Degeneration of lumbar intervertebral disc 11/06/2017   Diabetes mellitus due to underlying condition with unspecified complications ([place]) 54/65/6812   Diabetes mellitus type 1, uncomplicated ([place]) 75/17/0017   Added automatically from request for surgery [id number]  Formatting of this note might be different from the original. Added automatically from request for surgery [id number]   Diabetes mellitus without complication ([place])    Type 1- injections only- recent change to Toujeo with improved  A1C reading.   Dysphagia 11/04/2017   Dyspnea on exertion 12/04/2017   Elevated alkaline phosphatase level 11/09/2020   Elevated LFTs 03/10/2019   Formatting of this note might be different from the original. History.  Cannot change to this.   Elevated parathyroid hormone 06/06/2021   Erythrocytosis 12/04/2017   Facial tingling 07/11/2020   Fatigue 11/06/2017   Fever 29/92/4268   Folic acid deficiency 34/19/6222   Fusion of lumbar spine 01/11/2020   HAP (hospital-acquired pneumonia) 01/13/2020   Hemangioma 11/06/2017   History of metabolic disorder 97/98/9211   History of pneumonia 01/25/2020   Formatting of this note might be different from the original. Recent mutifocal pneumonia September 2021 Formatting of this note might be different from the original. Formatting of this note might be different from the original. Recent mutifocal pneumonia September 2021   History of sepsis 01/25/2020   Formatting of this note might be different from the original. Recent septic syndrome with multifocal pneumonia Formatting of this note might be different from the original. Formatting of this note might be different from the original. Recent septic syndrome with multifocal pneumonia   HNP (herniated nucleus pulposus), lumbar 05/10/2014   Hypotension 07/18/2020   Idiopathic peripheral  neuropathy 08/29/2020   Insulin long-term use ([place]) 11/06/2017   Intermittent claudication ([place]) 12/04/2017   Irritable bowel syndrome 09/03/2020   Left foot pain 01/28/2018   Lipoma of abdominal wall 08/08/2020   Lipoma of left upper extremity 09/04/2020   Low back pain 10/22/2019   Lumbar post-laminectomy syndrome 03/04/2019   Lumbar radiculopathy 05/26/2017   Lung mass 12/04/2017   Malaise and fatigue 11/06/2017   Menopausal disorder 11/06/2017   Mixed hyperlipidemia 04/09/2016   Formatting of this note might be different from the original. Added automatically from request for surgery [id number]   Moderate chronic obstructive pulmonary disease ([place]) 11/06/2017   Multiple actinic keratoses 11/06/2017   Myositis 12/04/2017   Nicotine dependence, cigarettes, uncomplicated 81/44/8185   Obsessive-compulsive disorder 10/14/2019   Formatting of this note might be different from the original. Formerly followed with [facility].   Osteoporosis of multiple sites without pathological fracture 06/06/2021   Overweight with body mass index (BMI) 25.0-29.9 07/11/2015   Last Assessment & Plan:  Relevant Hx: Course: Daily Update: Today's Plan:difficult for her with the insulin as well as her abilify and the weight is increaseing for her rather than decreasing, she has cut her alcohol intake back, she has worked on her diet more with decreasing her carb intake as well and still difficulty discussed with the saxenda and will try to get this approved for her and see    Pain in both lower extremities 07/10/2015   Last Assessment & Plan:  Formatting of this note might be different from the original. Relevant Hx: Course: Daily Update: Today's Plan:she had pain to her right anterior thigh there is edema of both legs and she has fear for DVT which she is going to have [place] of and confirm ok, she has increased her weight which is part of her edema I believe as well as her insulin usage. She is advised as well tha   Pain of  left hip joint 07/23/2018   Pelvic pain 12/14/2020   Personal history of (healed) traumatic fracture 12/31/2016   Pneumonia    Polyarthralgia 06/11/2018   Primary insomnia 06/05/2015   Primary osteoarthritis involving multiple joints 08/24/2019   Pure hypercholesterolemia 02/11/2016   Purpura ([place]) 11/06/2017   Respiratory failure ([place]) 11/2018   RUQ  pain 11/09/2020   S/P lumbar laminectomy 09/18/2021   Sciatica 11/03/2017   Scoliosis deformity of spine 11/03/2017   Secondary diabetes mellitus ([place]) 12/24/2017   Senile purpura ([place]) 12/04/2017   Sepsis ([place]) 07/18/2020   Smoking greater than 30 pack years 02/26/2016   Tobacco abuse 02/11/2016   Tobacco user 02/11/2016   Trichotillomania    hx.    Trigger thumb of right hand 85/46/2703   Uncomplicated type 1 diabetes mellitus ([place]) 10/22/2020    Last menstrual period 05/04/2008.   Mixed hyperlipidemia Patient with CAD and hyperlipidemia, most recent LDL at 126 despite compliance with atorvastatin.  We still need to achieve another 60% drop in LDL to get her to goal.  Reviewed options for lowering LDL cholesterol, specifically PCSK-9 inhibitors.  Discussed mechanisms of action, dosing, side effects and potential decreases in LDL cholesterol.  Also reviewed cost information and potential options for patient assistance.  Answered all patient questions.  Based on this information, patient agreeable to start PCSK-9i.  Will do prior authorization and once approved she will do one injection every 14 days.  Repeat cholesterol labs in January   Tommy Medal PharmD CPP [facility] [address] [place], [place] [address] [phone]

## 2022-02-24 NOTE — Patient Instructions (Addendum)
Your Results:             Your most recent labs Goal  Total Cholesterol 200 < 200  Triglycerides 151 < 150  HDL (happy/good cholesterol) 51 > 40  LDL (lousy/bad cholesterol 126 < 55   Medication changes:  We will start the process to get Repatha covered by your insurance.  Once the prior authorization is complete, I will call you to let you know and confirm pharmacy information.   You will take one injection every 14 days.  Lab orders:  We want to repeat labs after 2-3 months.  We will send you a lab order to remind you once we get closer to that time.     1  Thank you for choosing [facility]

## 2022-02-26 ENCOUNTER — Telehealth: Payer: Self-pay | Admitting: Pharmacist Clinician (PhC)/ Clinical Pharmacy Specialist

## 2022-02-26 MED ORDER — REPATHA SURECLICK 140 MG/ML ~~LOC~~ SOAJ: 140.0000 mg | SUBCUTANEOUS | 12 refills | Status: DC

## 2022-02-26 NOTE — Telephone Encounter (Signed)
Repatha PA approved to 02/26/23  Key [id number]

## 2022-03-03 ENCOUNTER — Encounter: Payer: Self-pay | Admitting: Physician Assistant

## 2022-03-03 ENCOUNTER — Ambulatory Visit: Payer: BC Managed Care – PPO | Attending: Physician Assistant | Admitting: Physician Assistant

## 2022-03-03 VITALS — BP 110/74 | HR 60 | Resp 17 | Ht 62.0 in | Wt 149.0 lb

## 2022-03-03 DIAGNOSIS — Z8619 Personal history of other infectious and parasitic diseases: Secondary | ICD-10-CM

## 2022-03-03 DIAGNOSIS — M65311 Trigger thumb, right thumb: Secondary | ICD-10-CM

## 2022-03-03 DIAGNOSIS — M25461 Effusion, right knee: Secondary | ICD-10-CM

## 2022-03-03 DIAGNOSIS — M810 Age-related osteoporosis without current pathological fracture: Secondary | ICD-10-CM

## 2022-03-03 DIAGNOSIS — M65312 Trigger thumb, left thumb: Secondary | ICD-10-CM

## 2022-03-03 DIAGNOSIS — M2550 Pain in unspecified joint: Secondary | ICD-10-CM | POA: Diagnosis not present

## 2022-03-03 DIAGNOSIS — M25561 Pain in right knee: Secondary | ICD-10-CM

## 2022-03-03 DIAGNOSIS — L570 Actinic keratosis: Secondary | ICD-10-CM

## 2022-03-03 DIAGNOSIS — I7090 Unspecified atherosclerosis: Secondary | ICD-10-CM

## 2022-03-03 DIAGNOSIS — I25118 Atherosclerotic heart disease of native coronary artery with other forms of angina pectoris: Secondary | ICD-10-CM

## 2022-03-03 DIAGNOSIS — Z8659 Personal history of other mental and behavioral disorders: Secondary | ICD-10-CM

## 2022-03-03 DIAGNOSIS — F17210 Nicotine dependence, cigarettes, uncomplicated: Secondary | ICD-10-CM

## 2022-03-03 DIAGNOSIS — Z8719 Personal history of other diseases of the digestive system: Secondary | ICD-10-CM

## 2022-03-03 DIAGNOSIS — M4326 Fusion of spine, lumbar region: Secondary | ICD-10-CM

## 2022-03-03 DIAGNOSIS — J449 Chronic obstructive pulmonary disease, unspecified: Secondary | ICD-10-CM

## 2022-03-03 DIAGNOSIS — F633 Trichotillomania: Secondary | ICD-10-CM

## 2022-03-03 DIAGNOSIS — E7800 Pure hypercholesterolemia, unspecified: Secondary | ICD-10-CM

## 2022-03-03 DIAGNOSIS — M5136 Other intervertebral disc degeneration, lumbar region: Secondary | ICD-10-CM

## 2022-03-03 DIAGNOSIS — E109 Type 1 diabetes mellitus without complications: Secondary | ICD-10-CM

## 2022-03-11 ENCOUNTER — Ambulatory Visit: Payer: BC Managed Care – PPO | Attending: Physician Assistant | Admitting: Physician Assistant

## 2022-03-11 VITALS — BP 111/74 | HR 66

## 2022-03-11 DIAGNOSIS — M1711 Unilateral primary osteoarthritis, right knee: Secondary | ICD-10-CM | POA: Diagnosis not present

## 2022-03-11 DIAGNOSIS — M25561 Pain in right knee: Secondary | ICD-10-CM

## 2022-03-11 DIAGNOSIS — M25461 Effusion, right knee: Secondary | ICD-10-CM

## 2022-03-11 MED ORDER — TRIAMCINOLONE ACETONIDE 40 MG/ML IJ SUSP
40.0000 mg | INTRAMUSCULAR | Status: AC | PRN
  Administered 2022-03-11: 40 mg via INTRA_ARTICULAR

## 2022-03-11 MED ORDER — LIDOCAINE HCL 1 % IJ SOLN
1.5000 mL | INTRAMUSCULAR | Status: AC | PRN
  Administered 2022-03-11: 1.5 mL

## 2022-03-11 NOTE — Progress Notes (Signed)
   Procedure Note  Patient: [name]             Date of Birth: May 03, 1960           MRN: [id number]             Visit Date: 03/11/2022  Procedures: Visit Diagnoses:  1. Primary osteoarthritis of right knee   2. Pain and swelling of right knee     Large Joint Inj: R knee on 03/11/2022 1:06 PM Indications: pain Details: 27 G 1.5 in needle, medial approach  Arthrogram: No  Medications: 1.5 mL lidocaine 1 %; 40 mg triamcinolone acetonide 40 MG/ML Aspirate: 0 mL Outcome: tolerated well, no immediate complications Procedure, treatment alternatives, risks and benefits explained, specific risks discussed. Consent was given by the patient. Immediately prior to procedure a time out was called to verify the correct patient, procedure, equipment, support staff and site/side marked as required. Patient was prepped and draped in the usual sterile fashion.     MRI of the right knee from 11/27/2021 was reviewed today in the office: Mild medial tibiofemoral and patellofemoral osteoarthritis with generalized articular cartilage thinning and minimal osteophytes noted.  Large knee joint effusion noted.  Degenerative changes of the posterior horn of the medial meniscus without discrete tear. Plan to still proceed with applying for Visco gel injections for both knees as requested. The right knee joint was injected with cortisone today as requested.  Advised the patient to monitor blood pressure and blood sugar closely following the cortisone injection today. Patient tolerated the procedure well.  Aftercare was discussed.  Hazel Sams, PA-C

## 2022-03-28 ENCOUNTER — Telehealth: Payer: Self-pay | Admitting: *Deleted

## 2022-03-28 NOTE — Telephone Encounter (Signed)
Generic Forteo is interchangeable with brand-name so should be fine to switch to generic.  I don't see that rx was written for DAW brand-name Forteo only.  Returned call to pharmacy. Advised that the Presco generic does not have a copay card so clinic would like to keep with brand for now. Spoke with [place], [place].  Phone: [phone]  [place], PharmD, MPH, BCPS, CPP Clinical Pharmacist (Rheumatology and Pulmonology)

## 2022-03-28 NOTE — Telephone Encounter (Signed)
Received a call from [place] at [facility] contacted the office regarding the patient's Forteo.   He's states the Forteo now has a generic that became available week and he would like to know if it is okay to change the patient's prescription to the generic. Direct call back for [place]in Ina [phone]  Please advise.

## 2022-04-24 NOTE — Progress Notes (Signed)
Office Visit Note  Patient: [name]             Date of Birth: 10-10-1960           MRN: [id number]             PCP: [name], MD Referring: [name], MD Visit Date: 04/25/2022 Occupation: _0 @  Subjective:  Severe left knee pain   History of Present Illness: [name] is a 61 y.o. female with history of osteoarthritis, osteoporosis, and DDD.  Patient reports that on 04/05/2022 she was diagnosed with the flu which then developed into pneumonia.  She states that she was hospitalized for IV antibiotics due to the infection progressing to sepsis.  She has gradually been recovering but continues to have a residual cough. She reports that on 04/21/2022 she woke up in the middle of the night with severe pain in both knee joints.  She denies any recent injury or fall prior to the onset of symptoms.  She states that the pain in her knees woke her up from sleep and was severe to the point of a 12 out of 10 pain.  She states that the pain was so severe to the point that she was screaming.  She states that it was a sharp throbbing sensation in both knees.  She was having difficulty ambulating at that time.  She states she has woken up every night in the middle of the night with severe pain in both knee joints since then.  She states that she went to the emergency department and was evaluated on 04/24/2022 at which time she was given IV morphine for pain relief.  She was also given a prescription for oxycodone for pain relief.  She states that she woke up last night around 2 AM again with screaming pain.  She states that her left knee is currently worse than the right knee joint.  She took an oxycodone this morning and currently rates her pain a 4 out of 10.  She denies any calf tightness or tenderness.  She denies any groin pain or lower back discomfort.  The left knee joint has been locking intermittently and causing instability while walking.    Activities of Daily Living:   Patient reports joint stiffness lasting all day  Patient Reports nocturnal pain.  Difficulty dressing/grooming: Reports Difficulty climbing stairs: Reports Difficulty getting out of chair: Reports Difficulty using hands for taps, buttons, cutlery, and/or writing: Reports  Review of Systems  Constitutional:  Positive for fatigue.  HENT: Negative.  Negative for mouth sores and mouth dryness.   Eyes: Negative.  Negative for dryness.  Respiratory:  Positive for shortness of breath.   Cardiovascular:  Positive for chest pain. Negative for palpitations.  Gastrointestinal: Negative.  Negative for blood in stool, constipation and diarrhea.  Endocrine: Negative.  Negative for increased urination.  Genitourinary: Negative.  Negative for involuntary urination.  Musculoskeletal:  Positive for joint pain, gait problem, joint pain, myalgias, muscle weakness, morning stiffness and myalgias. Negative for joint swelling and muscle tenderness.  Skin: Negative.  Negative for color change, rash, hair loss and sensitivity to sunlight.  Allergic/Immunologic: Negative.  Negative for susceptible to infections.  Neurological:  Negative for dizziness and headaches.  Hematological: Negative.  Negative for swollen glands.  Psychiatric/Behavioral:  Positive for depressed mood and sleep disturbance. The patient is not nervous/anxious.     PMFS History:  Patient Active Problem List   Diagnosis Date Noted  Osteoarthritis of right knee 12/07/2021   Dermatographic urticaria 10/18/2021   S/P lumbar laminectomy 09/18/2021   Elevated parathyroid hormone 06/06/2021   Osteoporosis of multiple sites without pathological fracture 06/06/2021   Accelerating angina (HCC) 05/06/2021   Complex regional pain syndrome of lower limb 03/07/2021   Pelvic pain 12/14/2020   RUQ pain 11/09/2020   Elevated alkaline phosphatase level 75/64/3329   Uncomplicated type 1 diabetes mellitus ([place]) 10/22/2020   Lipoma of left upper  extremity 09/04/2020   Irritable bowel syndrome 09/03/2020   Idiopathic peripheral neuropathy 08/29/2020   Lipoma of abdominal wall 08/08/2020   Fever 07/18/2020   Hypotension 07/18/2020   Sepsis ([place]) 07/18/2020   Pneumonia 07/18/2020   Arthritis 07/18/2020   Blurred vision 07/11/2020   Facial tingling 07/11/2020   Diabetes mellitus without complication ([place])    Acute left ankle pain 03/09/2020   History of pneumonia 01/25/2020   History of sepsis 01/25/2020   Respiratory failure ([place]) 01/13/2020   HAP (hospital-acquired pneumonia) 01/13/2020   Fusion of lumbar spine 01/11/2020   Trigger thumb of right hand 11/14/2019   Low back pain 10/22/2019   Obsessive-compulsive disorder 10/14/2019   Diabetes mellitus due to underlying condition with unspecified complications ([place]) 51/88/4166   Primary osteoarthritis involving multiple joints 08/24/2019   Alcohol use 04/15/2019   Elevated LFTs 03/10/2019   Lumbar post-laminectomy syndrome 03/04/2019   Acute bilateral thoracic back pain 12/03/2018   Pain of left hip joint 07/23/2018   Polyarthralgia 06/11/2018   Bursitis of both hips 04/14/2018   Left foot pain 01/28/2018   CAD (coronary artery disease) 12/24/2017   Secondary diabetes mellitus ([place]) 12/24/2017   Coronary arteriosclerosis 12/24/2017   Dyspnea on exertion 12/04/2017   Attention deficit 12/04/2017   Intermittent claudication ([place]) 12/04/2017   History of metabolic disorder 10/26/1599   Lung mass 12/04/2017   Myositis 12/04/2017   Erythrocytosis 12/04/2017   Senile purpura ([place]) 12/04/2017   Chest pain 11/06/2017   Multiple actinic keratoses 11/06/2017   Degeneration of lumbar intervertebral disc 11/06/2017   Fatigue 11/06/2017   Insulin long-term use ([place]) 11/06/2017   Menopausal disorder 11/06/2017   Purpura ([place]) 11/06/2017   Moderate chronic obstructive pulmonary disease ([place]) 09/32/3557   Folic acid deficiency 32/20/2542   Malaise and fatigue 11/06/2017    Arteriosclerotic vascular disease 11/06/2017   Dysphagia 11/04/2017   Sciatica 11/03/2017   Scoliosis deformity of spine 11/03/2017   Lumbar radiculopathy 05/26/2017   Personal history of (healed) traumatic fracture 12/31/2016   Deficiency of other specified B group vitamins 11/07/2016   Mixed hyperlipidemia 04/09/2016   Smoking greater than 30 pack years 02/26/2016   Cigarette smoker 02/26/2016   Nicotine dependence, cigarettes, uncomplicated 70/62/3762   Diabetes mellitus type 1, uncomplicated ([place]) 83/15/1761   Tobacco abuse 02/11/2016   Tobacco user 02/11/2016   Pure hypercholesterolemia 02/11/2016   Atherosclerosis of native coronary artery of native heart without angina pectoris 02/11/2016   Coronary artery disease of native artery of native heart with stable angina pectoris ([place]) 01/25/2016   Abnormal thyroid function test 12/06/2015   Chronic bronchitis ([place]) 12/04/2015   Overweight with body mass index (BMI) 25.0-29.9 07/11/2015   BMI 27.0-27.9,adult 07/11/2015   Pain in both lower extremities 07/10/2015   Anxiety disorder 06/05/2015   Atherosclerosis 06/05/2015   Hemangioma 06/05/2015   Primary insomnia 06/05/2015   Trichotillomania 06/05/2015   HNP (herniated nucleus pulposus), lumbar 05/10/2014    Past Medical History:  Diagnosis Date   Abnormal thyroid function test 12/06/2015  Accelerating angina ([place]) 05/06/2021   Acute bilateral thoracic back pain 12/03/2018   Acute left ankle pain 03/09/2020   Alcohol use 04/15/2019   Formatting of this note might be different from the original. Recommend abstinence from alcohol   Anxiety disorder 06/05/2015   Last Assessment & Plan:  Formatting of this note might be different from the original. Had started her on some xanax about week ago and she feels it has helped her and she is taking about half tablet for this and will continue with it as she has been compliant with it   Arteriosclerotic vascular disease 11/06/2017    Arthritis    back    Atherosclerosis 06/05/2015   Formatting of this note might be different from the original. Seen on CTs of chest.   Atherosclerosis of native coronary artery of native heart without angina pectoris 02/11/2016   Attention deficit 12/04/2017   Blurred vision 07/11/2020   BMI 27.0-27.9,adult 07/11/2015   Last Assessment & Plan:  Formatting of this note might be different from the original. Relevant Hx: Course: Daily Update: Today's Plan:difficult for her with the insulin as well as her abilify and the weight is increaseing for her rather than decreasing, she has cut her alcohol intake back, she has worked on her diet more with decreasing her carb intake as well and still difficulty discussed with    Bursitis of both hips 04/14/2018   CAD (coronary artery disease) 12/24/2017   Chest pain 11/06/2017   Chronic bronchitis ([place]) 12/04/2015   Formatting of this note might be different from the original. PFT ratio 88% FEV1 103% FVC 94% DLCO 76%  Last Assessment & Plan:  Formatting of this note might be different from the original. We discussed this as she has been coughing again for about 2 mnths and she sounds congested on exam with previous ease for pneumonia will place her on levaquin and have reviewed with her the importance of her    Cigarette smoker 02/26/2016   Complex regional pain syndrome of lower limb 03/07/2021   Coronary arteriosclerosis 12/24/2017   Coronary artery disease of native artery of native heart with stable angina pectoris ([place]) 01/25/2016   Formatting of this note might be different from the original. 2017:  50% LAD lesion.  Calcium noted other places.  Heart cath December 31, 2017 with a 30% ostial LAD lesion otherwise normal coronary arteries seen with LVEF 55 to 65%   Deficiency of other specified B group vitamins 11/07/2016   Degeneration of lumbar intervertebral disc 11/06/2017   Diabetes mellitus due to underlying condition with unspecified complications  ([place]) 99/24/2683   Diabetes mellitus type 1, uncomplicated ([place]) 41/96/2229   Added automatically from request for surgery [id number]  Formatting of this note might be different from the original. Added automatically from request for surgery [id number]   Diabetes mellitus without complication ([place])    Type 1- injections only- recent change to [place] with improved A1C reading.   Dysphagia 11/04/2017   Dyspnea on exertion 12/04/2017   Elevated alkaline phosphatase level 11/09/2020   Elevated LFTs 03/10/2019   Formatting of this note might be different from the original. History.  Cannot change to this.   Elevated parathyroid hormone 06/06/2021   Erythrocytosis 12/04/2017   Facial tingling 07/11/2020   Fatigue 11/06/2017   Fever 12/09/4816   Folic acid deficiency 56/31/4970   Fusion of lumbar spine 01/11/2020   HAP (hospital-acquired pneumonia) 01/13/2020   Hemangioma 11/06/2017   History of metabolic disorder 26/37/8588  History of pneumonia 01/25/2020   Formatting of this note might be different from the original. Recent mutifocal pneumonia September 2021 Formatting of this note might be different from the original. Formatting of this note might be different from the original. Recent mutifocal pneumonia September 2021   History of sepsis 01/25/2020   Formatting of this note might be different from the original. Recent septic syndrome with multifocal pneumonia Formatting of this note might be different from the original. Formatting of this note might be different from the original. Recent septic syndrome with multifocal pneumonia   HNP (herniated nucleus pulposus), lumbar 05/10/2014   Hypotension 07/18/2020   Idiopathic peripheral neuropathy 08/29/2020   Insulin long-term use ([place]) 11/06/2017   Intermittent claudication ([place]) 12/04/2017   Irritable bowel syndrome 09/03/2020   Left foot pain 01/28/2018   Lipoma of abdominal wall 08/08/2020   Lipoma of left upper extremity 09/04/2020   Low back  pain 10/22/2019   Lumbar post-laminectomy syndrome 03/04/2019   Lumbar radiculopathy 05/26/2017   Lung mass 12/04/2017   Malaise and fatigue 11/06/2017   Menopausal disorder 11/06/2017   Mixed hyperlipidemia 04/09/2016   Formatting of this note might be different from the original. Added automatically from request for surgery [id number]   Moderate chronic obstructive pulmonary disease ([place]) 11/06/2017   Multiple actinic keratoses 11/06/2017   Myositis 12/04/2017   Nicotine dependence, cigarettes, uncomplicated 37/90/2409   Obsessive-compulsive disorder 10/14/2019   Formatting of this note might be different from the original. Formerly followed with [facility].   Osteoporosis of multiple sites without pathological fracture 06/06/2021   Overweight with body mass index (BMI) 25.0-29.9 07/11/2015   Last Assessment & Plan:  Relevant Hx: Course: Daily Update: Today's Plan:difficult for her with the insulin as well as her abilify and the weight is increaseing for her rather than decreasing, she has cut her alcohol intake back, she has worked on her diet more with decreasing her carb intake as well and still difficulty discussed with the saxenda and will try to get this approved for her and see    Pain in both lower extremities 07/10/2015   Last Assessment & Plan:  Formatting of this note might be different from the original. Relevant Hx: Course: Daily Update: Today's Plan:she had pain to her right anterior thigh there is edema of both legs and she has fear for DVT which she is going to have [place] of and confirm ok, she has increased her weight which is part of her edema I believe as well as her insulin usage. She is advised as well tha   Pain of left hip joint 07/23/2018   Pelvic pain 12/14/2020   Personal history of (healed) traumatic fracture 12/31/2016   Pneumonia    Polyarthralgia 06/11/2018   Primary insomnia 06/05/2015   Primary osteoarthritis involving multiple joints 08/24/2019   Pure  hypercholesterolemia 02/11/2016   Purpura ([place]) 11/06/2017   Respiratory failure ([place]) 11/2018   RUQ pain 11/09/2020   S/P lumbar laminectomy 09/18/2021   Sciatica 11/03/2017   Scoliosis deformity of spine 11/03/2017   Secondary diabetes mellitus ([place]) 12/24/2017   Senile purpura ([place]) 12/04/2017   Sepsis ([place]) 07/18/2020   Smoking greater than 30 pack years 02/26/2016   Tobacco abuse 02/11/2016   Tobacco user 02/11/2016   Trichotillomania    hx.    Trigger thumb of right hand 73/53/2992   Uncomplicated type 1 diabetes mellitus ([place]) 10/22/2020    Family History  Problem Relation Age of Onset   Diabetes Mother  Cancer Mother    Hypertension Father    Heart disease Father    Diabetes Father    Healthy Brother    Healthy Son    Diabetes Son    Healthy Daughter    Past Surgical History:  Procedure Laterality Date   ABDOMINAL EXPOSURE N/A 01/11/2020   Procedure: ABDOMINAL EXPOSURE;  Surgeon: Marty Heck, MD;  Location: [facility];  Service: Vascular;  Laterality: N/A;   ANTERIOR LUMBAR FUSION N/A 01/11/2020   Procedure: ANTERIOR LUMBAR INTERBODY FUSION  LUMBAR FIVE-SACRAL ONE, LEFT LUMBAR TWO-THREE FORAMINOTOMY, DISECTOMY;  Surgeon: Melina Schools, MD;  Location: [facility];  Service: Orthopedics;  Laterality: N/A;   APPENDECTOMY  04/28/1970   BACK SURGERY  2016   CARDIAC CATHETERIZATION  12/31/2017   COLONOSCOPY  04/28/2017   EXCISION/RELEASE BURSA HIP Left 04/14/2018   Procedure: Excision trochanteric bursa left hip;  Surgeon: Susa Day, MD;  Location: [facility];  Service: Orthopedics;  Laterality: Left;  60 mins   HAND SURGERY     HIP OPEN REDUCTION Right    ORIF   INTRAVASCULAR PRESSURE WIRE/FFR STUDY N/A 05/06/2021   Procedure: INTRAVASCULAR PRESSURE WIRE/FFR STUDY;  Surgeon: Nelva Bush, MD;  Location: [facility];  Service: Cardiovascular;  Laterality: N/A;   KNEE ARTHROSCOPY     LEFT HEART CATH AND CORONARY ANGIOGRAPHY N/A 12/31/2017   Procedure: LEFT  HEART CATH AND CORONARY ANGIOGRAPHY;  Surgeon: Martinique, Peter M, MD;  Location: [facility];  Service: Cardiovascular;  Laterality: N/A;   LEFT HEART CATH AND CORONARY ANGIOGRAPHY N/A 05/06/2021   Procedure: LEFT HEART CATH AND CORONARY ANGIOGRAPHY;  Surgeon: Nelva Bush, MD;  Location: [facility];  Service: Cardiovascular;  Laterality: N/A;   LUMBAR LAMINECTOMY/DECOMPRESSION MICRODISCECTOMY Left 05/10/2014   Procedure: MICRODISCECTOMY LUMBAR DECOMPRESSION L5-S1 LEFT ;  Surgeon: Johnn Hai, MD;  Location: [facility];  Service: Orthopedics;  Laterality: Left;   LUMBAR LAMINECTOMY/DECOMPRESSION MICRODISCECTOMY N/A 01/11/2020   Procedure: LUMBAR LAMINECTOMY/DECOMPRESSION MICRODISCECTOMY LEFT LUMBAR TWO-THREE;  Surgeon: Melina Schools, MD;  Location: [facility];  Service: Orthopedics;  Laterality: N/A;   LUMBAR LAMINECTOMY/DECOMPRESSION MICRODISCECTOMY Right 09/18/2021   Procedure: Laminectomy and Foraminotomy - Lumbar Four-Lumbar Five - right;  Surgeon: Eustace Moore, MD;  Location: [facility];  Service: Neurosurgery;  Laterality: Right;  Laminectomy and Foraminotomy - Lumbar Four-Lumbar Five - right   ORIF WRIST FRACTURE Right    retained hardware   ORTHOPAEDIC SURGERY  04/14/2018   TUBAL LIGATION  12/25/1995   Social History   Social History Narrative   ** Merged History Encounter **       Immunization History  Administered Date(s) Administered   Influenza,inj,Quad PF,6+ Mos 01/21/2016, 12/31/2016, 02/09/2018, 01/18/2021   Influenza,inj,quad, With Preservative 12/27/2016   Influenza-Unspecified 01/21/2016, 12/31/2016   Moderna Sars-Covid-2 Vaccination 04/29/2019, 05/30/2019   Pneumococcal Conjugate-13 07/23/2017   Pneumococcal Polysaccharide-23 02/01/2021   Unspecified SARS-COV-2 Vaccination 02/28/2020     Objective: Vital Signs: BP 107/73 (BP Location: Right Arm, Patient Position: Sitting, Cuff Size: Normal)   Pulse 71   Resp 16   Ht _0  (1.575 m)   Wt 150 lb (68 kg)    LMP 05/04/2008   BMI 27.44 kg/m    Physical Exam Vitals and nursing note reviewed.  Constitutional:      Appearance: She is well-developed.  HENT:     Head: Normocephalic and atraumatic.  Eyes:     Conjunctiva/sclera: Conjunctivae normal.  Cardiovascular:     Rate and Rhythm: Normal rate  and regular rhythm.     Heart sounds: Normal heart sounds.  Pulmonary:     Effort: Pulmonary effort is normal.     Breath sounds: Normal breath sounds.  Abdominal:     General: Bowel sounds are normal.     Palpations: Abdomen is soft.  Musculoskeletal:     Cervical back: Normal range of motion.  Skin:    General: Skin is warm and dry.     Capillary Refill: Capillary refill takes less than 2 seconds.  Neurological:     Mental Status: She is alert and oriented to person, place, and time.  Psychiatric:        Behavior: Behavior normal.      Musculoskeletal Exam: C-spine, thoracic spine, lumbar spine have good range of motion.  No midline spinal tenderness.  Shoulder joints, elbow joints, wrist joints, MCPs, PIPs, DIPs have good range of motion with no synovitis.  Complete fist formation bilaterally.  Hip joints have good range of motion with no groin pain.  Painful range of motion of both knee joints, left greater than right.  Warmth but no effusion noted in the left knee.  Some tenderness over the left ankle.  CDAI Exam: CDAI Score: -- Patient Global: --; Provider Global: -- Swollen: --; Tender: -- Joint Exam 04/25/2022   No joint exam has been documented for this visit   There is currently no information documented on the homunculus. Go to the Rheumatology activity and complete the homunculus joint exam.  Investigation: No additional findings.  Imaging: No results found.  Recent Labs: Lab Results  Component Value Date   WBC 6.9 10/22/2021   HGB 13.5 10/22/2021   PLT 219 09/11/2021   NA 141 10/22/2021   K 4.1 10/22/2021   CL 102 10/22/2021   CO2 25 10/22/2021   GLUCOSE 141  (H) 10/22/2021   BUN 13 10/22/2021   CREATININE 0.64 10/22/2021   BILITOT 0.9 10/22/2021   ALKPHOS 109 10/22/2021   AST 22 10/22/2021   ALT 23 10/22/2021   PROT 5.9 (L) 10/22/2021   ALBUMIN 4.1 10/22/2021   CALCIUM 9.4 10/22/2021   GFRAA >60 01/21/2020    Speciality Comments: Avoid oral bisphosphonates-history of GER  Procedures:  No procedures performed Allergies: Celebrex [celecoxib] and Mobic [meloxicam]   Assessment / Plan:     Visit Diagnoses: Primary osteoarthritis of right knee: She continues to have chronic pain in the right knee joint.  She has not had any recent injury or fall.  She experiences intermittent locking and instability.  She has been waking up with severe nocturnal pain in both knees since 04/21/2022.  She was evaluated in the emergency department on 04/24/2022 at which time she had a CT to rule out a blood clot as well as updated x-rays  Pain and swelling of right knee: She had a right knee joint cortisone injection on 03/11/2022 which provided significant relief but her symptoms have returned.  She has been experiencing severe nocturnal pain in both knees joints as discussed below.  On examination she has painful range of motion of the right knee joint but no effusion was noted.  Primary osteoarthritis of left knee -Patient presents today with severe pain in the left knee joint.  She has been experiencing severe nocturnal pain in both knee joints, left greater than right since 04/21/2022.  She has not had any recent injury or fall.  She has been experiencing intermittent locking and instability in the left knee.  She has had difficulty  ambulating due to the severity of pain.  In the middle the night she rates her pain a 12 out of 10.  She states that the pain in her knees has been waking her up out of sleep to the point that she is screaming due to the severity of her symptoms.  She was evaluated at the emergency department yesterday and had updated x-rays as well as a  CT to rule out a blood clot.  She also had lab work which was reviewed today in the office including elevated sed rate and elevated CRP.  She has warmth of the left knee on examination today.  No effusion was noted.  An MRI of the left knee will be obtained for further evaluation.  The following lab work will also be obtained.  Plan: Rheumatoid factor, Cyclic citrul peptide antibody, IgG, Uric acid  Chronic pain of left knee -Patient presents today with severe pain in the left knee joint.  She has been experiencing severe nocturnal pain since 04/21/2022.  She has not had any recent injury or fall.  She has been experiencing intermittent locking as well as instability while walking.  She describes the pain as a sharp throbbing sensation in both knees, left greater than right.  On examination she has painful range of motion of both knee joints with warmth in the left knee but no effusion noted.  ESR was 40 and CRP was 14.4 on 04/24/2022.  She had updated x-rays of the left knee yesterday at the emergency department.  Due to the severity of her pain, instability, difficulty ambulating as well as significant nocturnal pain a MRI of the left knee will be obtained for further evaluation to rule out an inflammatory arthritis process vs an internal derangement.  RF, anti-CCP, and uric acid level were also checked today.  Plan: MR KNEE LEFT WO CONTRAST, Rheumatoid factor, Cyclic citrul peptide antibody, IgG, Uric acid  Age-related osteoporosis without current pathological fracture - DXA 03/20/21: BMD LFN 0.672 with T-score -2.6. Patient was started on Forteo daily injections on 09/30/21.  She is tolerating Forteo daily injections.  Trigger thumb of right hand - [facility]  Trigger thumb, left thumb - [facility]  DDD (degenerative disc disease), lumbar - Status post discectomy L2-L3 and L4-L5.  L4-L5 laminectomy and microdiscectomy on Sep 18, 2021 by Dr. Ronnald Ramp.  Chronic pain.  Fusion of lumbar spine - Status  post L5-S1 fusion September 2021 by Dr. Rolena Infante.  Sep 18, 2021 L4-L5 core decompression by Dr. Sherley Bounds.  Other medical conditions are listed as follows:   Coronary artery disease of native artery of native heart with stable angina pectoris (HCC)  Moderate chronic obstructive pulmonary disease ([place])  Atherosclerosis  Pure hypercholesterolemia  Diabetes mellitus type 1, uncomplicated (HCC)  History of gastroesophageal reflux (GERD)  History of sepsis  Trichotillomania  History of anxiety  Nicotine dependence, cigarettes, uncomplicated  Multiple actinic keratoses   Orders: Orders Placed This Encounter  Procedures   MR KNEE LEFT WO CONTRAST   Rheumatoid factor   Cyclic citrul peptide antibody, IgG   Uric acid   No orders of the defined types were placed in this encounter.  Follow-Up Instructions: Return in about 4 weeks (around 05/23/2022) for Osteoarthritis, Osteoporosis, DDD.   Ofilia Neas, PA-C  Note - This record has been created using Dragon software.  Chart creation errors have been sought, but may not always  have been located. Such creation errors do not reflect on  the standard of medical  care.

## 2022-04-25 ENCOUNTER — Telehealth (HOSPITAL_BASED_OUTPATIENT_CLINIC_OR_DEPARTMENT_OTHER): Payer: Self-pay

## 2022-04-25 ENCOUNTER — Ambulatory Visit: Payer: BC Managed Care – PPO | Attending: Physician Assistant | Admitting: Physician Assistant

## 2022-04-25 ENCOUNTER — Telehealth: Payer: Self-pay | Admitting: Physician Assistant

## 2022-04-25 ENCOUNTER — Encounter: Payer: Self-pay | Admitting: Physician Assistant

## 2022-04-25 VITALS — BP 107/73 | HR 71 | Resp 16 | Ht 62.0 in | Wt 150.0 lb

## 2022-04-25 DIAGNOSIS — Z8659 Personal history of other mental and behavioral disorders: Secondary | ICD-10-CM

## 2022-04-25 DIAGNOSIS — J449 Chronic obstructive pulmonary disease, unspecified: Secondary | ICD-10-CM

## 2022-04-25 DIAGNOSIS — F17210 Nicotine dependence, cigarettes, uncomplicated: Secondary | ICD-10-CM

## 2022-04-25 DIAGNOSIS — M4326 Fusion of spine, lumbar region: Secondary | ICD-10-CM

## 2022-04-25 DIAGNOSIS — M25461 Effusion, right knee: Secondary | ICD-10-CM

## 2022-04-25 DIAGNOSIS — F633 Trichotillomania: Secondary | ICD-10-CM

## 2022-04-25 DIAGNOSIS — Z8619 Personal history of other infectious and parasitic diseases: Secondary | ICD-10-CM

## 2022-04-25 DIAGNOSIS — M1712 Unilateral primary osteoarthritis, left knee: Secondary | ICD-10-CM | POA: Diagnosis not present

## 2022-04-25 DIAGNOSIS — M65311 Trigger thumb, right thumb: Secondary | ICD-10-CM

## 2022-04-25 DIAGNOSIS — M25562 Pain in left knee: Secondary | ICD-10-CM | POA: Diagnosis not present

## 2022-04-25 DIAGNOSIS — M172 Bilateral post-traumatic osteoarthritis of knee: Secondary | ICD-10-CM

## 2022-04-25 DIAGNOSIS — M810 Age-related osteoporosis without current pathological fracture: Secondary | ICD-10-CM

## 2022-04-25 DIAGNOSIS — M1711 Unilateral primary osteoarthritis, right knee: Secondary | ICD-10-CM | POA: Diagnosis not present

## 2022-04-25 DIAGNOSIS — E7800 Pure hypercholesterolemia, unspecified: Secondary | ICD-10-CM

## 2022-04-25 DIAGNOSIS — G8929 Other chronic pain: Secondary | ICD-10-CM

## 2022-04-25 DIAGNOSIS — Z8719 Personal history of other diseases of the digestive system: Secondary | ICD-10-CM

## 2022-04-25 DIAGNOSIS — I25118 Atherosclerotic heart disease of native coronary artery with other forms of angina pectoris: Secondary | ICD-10-CM

## 2022-04-25 DIAGNOSIS — L570 Actinic keratosis: Secondary | ICD-10-CM

## 2022-04-25 DIAGNOSIS — M25561 Pain in right knee: Secondary | ICD-10-CM | POA: Diagnosis not present

## 2022-04-25 DIAGNOSIS — I7090 Unspecified atherosclerosis: Secondary | ICD-10-CM

## 2022-04-25 DIAGNOSIS — M5136 Other intervertebral disc degeneration, lumbar region: Secondary | ICD-10-CM

## 2022-04-25 DIAGNOSIS — M65312 Trigger thumb, left thumb: Secondary | ICD-10-CM

## 2022-04-25 DIAGNOSIS — E109 Type 1 diabetes mellitus without complications: Secondary | ICD-10-CM

## 2022-04-25 NOTE — Telephone Encounter (Signed)
Peer to peer for left knee MRI was denied by insurance.

## 2022-04-29 NOTE — Progress Notes (Signed)
Uric acid WNL  RF negative

## 2022-04-30 LAB — RHEUMATOID FACTOR: Rheumatoid fact SerPl-aCnc: <14 IU/mL (ref <14)

## 2022-04-30 LAB — URIC ACID: Uric Acid, Serum: 5.8 mg/dL (ref 2.5–7.0)

## 2022-04-30 LAB — CYCLIC CITRUL PEPTIDE ANTIBODY, IGG: Cyclic Citrullin Peptide Ab: <16 UNITS

## 2022-04-30 NOTE — Progress Notes (Signed)
Anti-CCP negative.

## 2022-05-06 ENCOUNTER — Inpatient Hospital Stay: Payer: BC Managed Care – PPO | Admitting: Nurse Practitioner

## 2022-05-15 ENCOUNTER — Ambulatory Visit: Payer: BC Managed Care – PPO | Admitting: Nurse Practitioner

## 2022-05-15 ENCOUNTER — Encounter: Payer: Self-pay | Admitting: Nurse Practitioner

## 2022-05-15 VITALS — BP 110/74 | HR 86 | Ht 62.0 in | Wt 150.0 lb

## 2022-05-15 DIAGNOSIS — J1100 Influenza due to unidentified influenza virus with unspecified type of pneumonia: Secondary | ICD-10-CM | POA: Diagnosis not present

## 2022-05-15 DIAGNOSIS — F17210 Nicotine dependence, cigarettes, uncomplicated: Secondary | ICD-10-CM | POA: Diagnosis not present

## 2022-05-15 DIAGNOSIS — R0609 Other forms of dyspnea: Secondary | ICD-10-CM | POA: Diagnosis not present

## 2022-05-15 NOTE — Assessment & Plan Note (Signed)
Admitted December 2023 for acute respiratory failure and sepsis secondary to influenza pneumonia.  She is clinically improved today.  Back to her baseline per her report.  Encouraged her to continue monitoring symptoms and notify of any worsening.  She has had repeat imaging which showed resolution of groundglass opacities.  Strict return precautions.  Patient Instructions  Trial albuterol 2 puffs every 6 hours as needed for shortness of breath or wheezing.   Previous pulmonary function testing showed normal lung function and you did not have a formal diagnosis of COPD.   Nicotine patches - apply one 21 mcg patch daily for the next 6 weeks, then decrease to one 14 mcg patch daily for 2 weeks then the 7 mcg patch daily for 2 weeks. Rotate site each day and apply on clean, dry skin. Goal quit date: 06/15/2022  Referral to lung cancer screening program - someone will contact you once it is closer to the time of your next CT scan, which will likely be January 2025   Follow up in 6 weeks with Dr. Shearon Stalls or Joellen Jersey Wylee Dorantes,NP to see how albuterol is going and quitting smoking. If symptoms or worsen, please contact office for sooner follow up or seek emergency care.

## 2022-05-15 NOTE — Progress Notes (Signed)
$'@Patient'c$  ID: [name], female    DOB: 07/27/1960, 62 y.o.   MRN: [id number]  Chief Complaint  Patient presents with   Follow-up    Pt had the flu and was admitted it turned septic PNA. She reports this is the 3rd time she has been dx w/ respiratory failure. She is feeling better now but still having some SOB and some chest pain.   Was on 4-10L while she was admitted    Referring provider: [name], MD  HPI: 62 year old female, active smoker followed for shortness of breath.  She is a patient of Dr. Mauricio Po and last seen in office 06/04/2021.  Past medical history significant for CAD with angina, IBS, DM 1, neuropathy, anxiety, HLD.   She had an episode of aspiration pneumonia while kayaking and inhaling water in 2020.  She was admitted to the hospital and in ICU for 8 days.  She required BiPAP therapy was never intubated.  Then in 2021, she had surgery for her back and was hospitalized at [facility]  She had an uncomplicated surgery; however, postoperatively became hypotensive and had acute respiratory failure so was admitted to the ICU.  She required high flow nasal cannula but never required intubation.  She was recently hospitalized in December 2023.  She had been treated by her PCP for suspected viral illness with respiratory congestion and cough.  She was negative for COVID and flu in the outpatient setting.  She continued to do poorly, had new fevers and went to urgent care where she tested positive for influenza.  There was also some concern that she might have a pneumonia so they treated her with azithromycin, doxycycline and prednisone.  She continued to decompensate at home and developed fevers as high as 103.  She presented to [facility] emergency department where she was felt to be septic based on upon high fever, tachycardia, x-ray evidence during bilateral groundglass opacities in her upper lobes and middle lobes in the right lung, as well as a elevated  procalcitonin with acidosis on labs.  There is also some suspicion for diabetic ketoacidosis given elevated sugar readings and anion gap.  She remained admitted for 5 days and was treated with steroids and IV antibiotics.  Never received Tamiflu.  Did slowly improve and was discharged on home oxygen therapy.   TEST/EVENTS:  01/14/2020 echo: EF 60 to 65%.  Normal diastolic parameters.  RV size and function is normal.  No valvular disease. 06/12/2021 PFT: FVC 89, FEV1 99, ratio 86, TLC 100, DLCOcor 81.  No BD.   05/16/2022: Today-follow-up Patient presents today for hospital follow-up with her husband.  She was admitted in December 2023 for acute respiratory failure secondary to influenza pneumonia.  She was discharged on supplemental oxygen but has since discontinued this.  Hospital notes not available today.  Reviewed her PCPs note from 1/3 which summarized her admission.  It appears that she also back to the hospital after discharge due to increased shortness of breath and discomfort in her needs.  She had a repeat CT of the chest that showed resolution of the groundglass pneumonia and no evidence of PE.  They also did a lower extremity Dopplers without any evidence of DVT.  She has an appointment with rheumatology coming up.  Today, she tells me that she is feeling much better.  Cough has mostly resolved.  She has not had any recurrence of her fevers or chest congestion.  She feels like her breathing is at  her baseline.  Does not feel any significant change compared to when she was here last.  She does get winded with certain activities, especially walking to her mailbox and back to the house.  She does have an albuterol inhaler which she has never tried to use.  Her PCP had told her that he thought she had COPD due to her smoking history.  She had pulmonary function testing 11 months ago which showed normal lung function and diffusion capacity; no formal diagnosis of COPD.  She did have some mid flow  reversibility.  Has been hospitalized 3 times during her lifetime for acute respiratory failure secondary to different etiologies.  First time was related to an aspiration pneumonia after inhaling like water.  Second time was postoperatively.  Third time most recently was related to influenza pneumonia.  Denies any trouble with chills, night sweats, hemoptysis, weight loss, lower extremity swelling, orthopnea, dizziness or syncope.  She is an active smoker.  She is smoking about a pack a day.  Wants to quit smoking.  Allergies  Allergen Reactions   Celebrex [Celecoxib] Swelling    Face swelling   Mobic [Meloxicam] Swelling    Face swelling    Immunization History  Administered Date(s) Administered   Influenza,inj,Quad PF,6+ Mos 01/21/2016, 12/31/2016, 02/09/2018, 01/18/2021   Influenza,inj,quad, With Preservative 12/27/2016   Influenza-Unspecified 01/21/2016, 12/31/2016   Moderna Sars-Covid-2 Vaccination 04/29/2019, 05/30/2019   Pneumococcal Conjugate-13 07/23/2017   Pneumococcal Polysaccharide-23 02/01/2021   Unspecified SARS-COV-2 Vaccination 02/28/2020    Past Medical History:  Diagnosis Date   Abnormal thyroid function test 12/06/2015   Accelerating angina ([place]) 05/06/2021   Acute bilateral thoracic back pain 12/03/2018   Acute left ankle pain 03/09/2020   Alcohol use 04/15/2019   Formatting of this note might be different from the original. Recommend abstinence from alcohol   Anxiety disorder 06/05/2015   Last Assessment & Plan:  Formatting of this note might be different from the original. Had started her on some xanax about week ago and she feels it has helped her and she is taking about half tablet for this and will continue with it as she has been compliant with it   Arteriosclerotic vascular disease 11/06/2017   Arthritis    back    Atherosclerosis 06/05/2015   Formatting of this note might be different from the original. Seen on CTs of chest.   Atherosclerosis of native  coronary artery of native heart without angina pectoris 02/11/2016   Attention deficit 12/04/2017   Blurred vision 07/11/2020   BMI 27.0-27.9,adult 07/11/2015   Last Assessment & Plan:  Formatting of this note might be different from the original. Relevant Hx: Course: Daily Update: Today's Plan:difficult for her with the insulin as well as her abilify and the weight is increaseing for her rather than decreasing, she has cut her alcohol intake back, she has worked on her diet more with decreasing her carb intake as well and still difficulty discussed with    Bursitis of both hips 04/14/2018   CAD (coronary artery disease) 12/24/2017   Chest pain 11/06/2017   Chronic bronchitis ([place]) 12/04/2015   Formatting of this note might be different from the original. PFT ratio 88% FEV1 103% FVC 94% DLCO 76%  Last Assessment & Plan:  Formatting of this note might be different from the original. We discussed this as she has been coughing again for about 2 mnths and she sounds congested on exam with previous ease for pneumonia will place her on levaquin  and have reviewed with her the importance of her    Cigarette smoker 02/26/2016   Complex regional pain syndrome of lower limb 03/07/2021   Coronary arteriosclerosis 12/24/2017   Coronary artery disease of native artery of native heart with stable angina pectoris ([place]) 01/25/2016   Formatting of this note might be different from the original. 2017:  50% LAD lesion.  Calcium noted other places.  Heart cath December 31, 2017 with a 30% ostial LAD lesion otherwise normal coronary arteries seen with LVEF 55 to 65%   Deficiency of other specified B group vitamins 11/07/2016   Degeneration of lumbar intervertebral disc 11/06/2017   Diabetes mellitus due to underlying condition with unspecified complications ([place]) 09/32/3557   Diabetes mellitus type 1, uncomplicated ([place]) 32/20/2542   Added automatically from request for surgery [id number]  Formatting of this note might be  different from the original. Added automatically from request for surgery [id number]   Diabetes mellitus without complication ([place])    Type 1- injections only- recent change to [place] with improved A1C reading.   Dysphagia 11/04/2017   Dyspnea on exertion 12/04/2017   Elevated alkaline phosphatase level 11/09/2020   Elevated LFTs 03/10/2019   Formatting of this note might be different from the original. History.  Cannot change to this.   Elevated parathyroid hormone 06/06/2021   Erythrocytosis 12/04/2017   Facial tingling 07/11/2020   Fatigue 11/06/2017   Fever 17/61/6073   Folic acid deficiency 71/09/2692   Fusion of lumbar spine 01/11/2020   HAP (hospital-acquired pneumonia) 01/13/2020   Hemangioma 11/06/2017   History of metabolic disorder 85/46/2703   History of pneumonia 01/25/2020   Formatting of this note might be different from the original. Recent mutifocal pneumonia September 2021 Formatting of this note might be different from the original. Formatting of this note might be different from the original. Recent mutifocal pneumonia September 2021   History of sepsis 01/25/2020   Formatting of this note might be different from the original. Recent septic syndrome with multifocal pneumonia Formatting of this note might be different from the original. Formatting of this note might be different from the original. Recent septic syndrome with multifocal pneumonia   HNP (herniated nucleus pulposus), lumbar 05/10/2014   Hypotension 07/18/2020   Idiopathic peripheral neuropathy 08/29/2020   Insulin long-term use ([place]) 11/06/2017   Intermittent claudication ([place]) 12/04/2017   Irritable bowel syndrome 09/03/2020   Left foot pain 01/28/2018   Lipoma of abdominal wall 08/08/2020   Lipoma of left upper extremity 09/04/2020   Low back pain 10/22/2019   Lumbar post-laminectomy syndrome 03/04/2019   Lumbar radiculopathy 05/26/2017   Lung mass 12/04/2017   Malaise and fatigue 11/06/2017    Menopausal disorder 11/06/2017   Mixed hyperlipidemia 04/09/2016   Formatting of this note might be different from the original. Added automatically from request for surgery [id number]   Moderate chronic obstructive pulmonary disease ([place]) 11/06/2017   Multiple actinic keratoses 11/06/2017   Myositis 12/04/2017   Nicotine dependence, cigarettes, uncomplicated 18/29/9371   Obsessive-compulsive disorder 10/14/2019   Formatting of this note might be different from the original. Formerly followed with [facility].   Osteoporosis of multiple sites without pathological fracture 06/06/2021   Overweight with body mass index (BMI) 25.0-29.9 07/11/2015   Last Assessment & Plan:  Relevant Hx: Course: Daily Update: Today's Plan:difficult for her with the insulin as well as her abilify and the weight is increaseing for her rather than decreasing, she has cut her alcohol intake back, she has worked on  her diet more with decreasing her carb intake as well and still difficulty discussed with the saxenda and will try to get this approved for her and see    Pain in both lower extremities 07/10/2015   Last Assessment & Plan:  Formatting of this note might be different from the original. Relevant Hx: Course: Daily Update: Today's Plan:she had pain to her right anterior thigh there is edema of both legs and she has fear for DVT which she is going to have [place] of and confirm ok, she has increased her weight which is part of her edema I believe as well as her insulin usage. She is advised as well tha   Pain of left hip joint 07/23/2018   Pelvic pain 12/14/2020   Personal history of (healed) traumatic fracture 12/31/2016   Pneumonia    Polyarthralgia 06/11/2018   Primary insomnia 06/05/2015   Primary osteoarthritis involving multiple joints 08/24/2019   Pure hypercholesterolemia 02/11/2016   Purpura ([place]) 11/06/2017   Respiratory failure ([place]) 11/2018   RUQ pain 11/09/2020   S/P lumbar laminectomy 09/18/2021   Sciatica  11/03/2017   Scoliosis deformity of spine 11/03/2017   Secondary diabetes mellitus ([place]) 12/24/2017   Senile purpura ([place]) 12/04/2017   Sepsis ([place]) 07/18/2020   Smoking greater than 30 pack years 02/26/2016   Tobacco abuse 02/11/2016   Tobacco user 02/11/2016   Trichotillomania    hx.    Trigger thumb of right hand 81/85/6314   Uncomplicated type 1 diabetes mellitus ([place]) 10/22/2020    Tobacco History: Social History   Tobacco Use  Smoking Status Every Day   Packs/day: 1.00   Years: 45.00   Total pack years: 45.00   Types: Cigarettes   Start date: 09/24/1977   Passive exposure: Current  Smokeless Tobacco Never  Tobacco Comments   Pt states 0.5-1ppd.    Verified [facility] 05/15/2022   Ready to quit: Not Answered Counseling given: Not Answered Tobacco comments: Pt states 0.5-1ppd.  Verified [facility] 05/15/2022   Outpatient Medications Prior to Visit  Medication Sig Dispense Refill   ALPRAZolam (XANAX) 0.5 MG tablet Take 0.5 mg by mouth at bedtime.      aspirin EC 81 MG tablet Take 1 tablet (81 mg total) by mouth daily. Swallow whole. 90 tablet 3   atorvastatin (LIPITOR) 80 MG tablet Take 80 mg by mouth daily.     cyanocobalamin (,VITAMIN B-12,) 1000 MCG/ML injection Inject 1,000 mcg into the muscle every 14 (fourteen) days.     Evolocumab (REPATHA SURECLICK) 970 MG/ML SOAJ Inject 140 mg into the skin every 14 (fourteen) days. 2 mL 12   finasteride (PROSCAR) 5 MG tablet Take 2.5 mg by mouth every evening.     FLUoxetine (PROZAC) 40 MG capsule Take 80 mg by mouth every evening.     folic acid (FOLVITE) 1 MG tablet Take 1 mg by mouth at bedtime.      FORTEO 600 MCG/2.4ML SOPN INJECT 20 MCG SUBCUTANEOUSLY ONCE DAILY. REFRIGERATE AND DISCARD PEN 28 DAYS AFTER INITIAL USE. 2.4 mL 4   furosemide (LASIX) 40 MG tablet Take 20-40 mg by mouth daily as needed for fluid.     insulin aspart (NOVOLOG FLEXPEN) 100 UNIT/ML FlexPen Inject 0-10 Units into the skin 3 (three) times daily with meals. Per  sliding scale     insulin degludec (TRESIBA FLEXTOUCH) 200 UNIT/ML FlexTouch Pen Inject 32 Units into the skin in the morning.     Insulin Pen Needle 31G X 5 MM MISC Use  to inject Forteo once daily (Patient taking differently: 1 each by Other route See admin instructions. Use to inject Forteo once daily) 100 each 2   linaclotide (LINZESS) 290 MCG CAPS capsule Take 290 mcg by mouth at bedtime.      nitroGLYCERIN (NITROSTAT) 0.4 MG SL tablet Place 1 tablet (0.4 mg total) under the tongue every 5 (five) minutes as needed for chest pain. 25 tablet 11   pantoprazole (PROTONIX) 40 MG tablet Take 40 mg by mouth at bedtime.     acetaminophen (TYLENOL) 500 MG tablet Take 500 mg by mouth daily as needed for mild pain. (Patient not taking: Reported on 05/15/2022)     metoprolol tartrate (LOPRESSOR) 25 MG tablet Take 1 tablet (25 mg total) by mouth 2 (two) times daily. (Patient not taking: Reported on 04/25/2022) 180 tablet 3   Semaglutide,0.25 or 0.'5MG'$ /DOS, (OZEMPIC, 0.25 OR 0.5 MG/DOSE,) 2 MG/3ML SOPN Inject 0.5 mg into the skin every Wednesday. (Patient not taking: Reported on 04/25/2022)     No facility-administered medications prior to visit.     Review of Systems:   Constitutional: No weight loss or gain, night sweats, fevers, chills,  or lassitude. +fatigue (improving) HEENT: No headaches, difficulty swallowing, tooth/dental problems, or sore throat. No sneezing, itching, ear ache, nasal congestion, or post nasal drip CV:  No chest pain, orthopnea, PND, swelling in lower extremities, anasarca, dizziness, palpitations, syncope Resp: +shortness of breath with exertion. No excess mucus or change in color of mucus. No productive or non-productive. No hemoptysis. No wheezing.  No chest wall deformity GI:  No heartburn, indigestion, abdominal pain, nausea, vomiting, diarrhea, change in bowel habits, loss of appetite, bloody stools.  GU: No dysuria, change in color of urine, urgency or frequency.   Skin: No  rash, lesions, ulcerations MSK:  No joint pain or swelling.  Neuro: No dizziness or lightheadedness.  Psych: No depression or anxiety. Mood stable.     Physical Exam:  BP 110/74   Pulse 86   Ht '5\' 2"'$  (1.575 m)   Wt 150 lb (68 kg)   LMP 05/04/2008   SpO2 96%   BMI 27.44 kg/m   GEN: Pleasant, interactive, well-appearing; in no acute distress. HEENT:  Normocephalic and atraumatic. PERRLA. Sclera white. Nasal turbinates pink, moist and patent bilaterally. No rhinorrhea present. Oropharynx pink and moist, without exudate or edema. No lesions, ulcerations, or postnasal drip.  NECK:  Supple w/ fair ROM. No JVD present. Normal carotid impulses w/o bruits. Thyroid symmetrical with no goiter or nodules palpated. No lymphadenopathy.   CV: RRR, no m/r/g, no peripheral edema. Pulses intact, +2 bilaterally. No cyanosis, pallor or clubbing. PULMONARY:  Unlabored, regular breathing. Clear bilaterally A&P w/o wheezes/rales/rhonchi. No accessory muscle use.  GI: BS present and normoactive. Soft, non-tender to palpation. No organomegaly or masses detected.  MSK: No erythema, warmth or tenderness. Cap refil <2 sec all extrem. No deformities or joint swelling noted.  Neuro: A/Ox3. No focal deficits noted.   Skin: Warm, no lesions or rashe Psych: Normal affect and behavior. Judgement and thought content appropriate.     Lab Results:  CBC    Component Value Date/Time   WBC 6.9 10/22/2021 1534   WBC 7.0 09/11/2021 1134   RBC 4.11 10/22/2021 1534   RBC 4.29 09/11/2021 1134   HGB 13.5 10/22/2021 1534   HCT 39.5 10/22/2021 1534   PLT 219 09/11/2021 1134   PLT 248 12/24/2017 1623   MCV 96 10/22/2021 1534   MCH 32.8 10/22/2021 1534  MCH 32.6 09/11/2021 1134   MCHC 34.2 10/22/2021 1534   MCHC 33.2 09/11/2021 1134   RDW 12.9 10/22/2021 1534   LYMPHSABS 2.0 10/22/2021 1534   MONOABS 0.6 01/21/2020 0450   EOSABS 0.2 10/22/2021 1534   BASOSABS 0.1 10/22/2021 1534    BMET    Component Value  Date/Time   NA 141 10/22/2021 1534   K 4.1 10/22/2021 1534   CL 102 10/22/2021 1534   CO2 25 10/22/2021 1534   GLUCOSE 141 (H) 10/22/2021 1534   GLUCOSE 150 (H) 09/11/2021 1134   BUN 13 10/22/2021 1534   CREATININE 0.64 10/22/2021 1534   CALCIUM 9.4 10/22/2021 1534   GFRNONAA >60 09/11/2021 1134   GFRAA >60 01/21/2020 0450    BNP    Component Value Date/Time   BNP 225.6 (H) 01/21/2020 0200     Imaging:  No results found.       Latest Ref Rng & Units 06/12/2021    2:52 PM  PFT Results  FVC-Pre L 2.71   FVC-Predicted Pre % 89   FVC-Post L 2.81   FVC-Predicted Post % 92   Pre FEV1/FVC % % 85   Post FEV1/FCV % % 86   FEV1-Pre L 2.32   FEV1-Predicted Pre % 99   FEV1-Post L 2.42   DLCO uncorrected ml/min/mmHg 15.23   DLCO UNC% % 81   DLCO corrected ml/min/mmHg 15.23   DLCO COR %Predicted % 81   DLVA Predicted % 89   TLC L 4.77   TLC % Predicted % 100   RV % Predicted % 89     No results found for: "NITRICOXIDE"      Assessment & Plan:   Influenzal pneumonia Admitted December 2023 for acute respiratory failure and sepsis secondary to influenza pneumonia.  She is clinically improved today.  Back to her baseline per her report.  Encouraged her to continue monitoring symptoms and notify of any worsening.  She has had repeat imaging which showed resolution of groundglass opacities.  Strict return precautions.  Patient Instructions  Trial albuterol 2 puffs every 6 hours as needed for shortness of breath or wheezing.   Previous pulmonary function testing showed normal lung function and you did not have a formal diagnosis of COPD.   Nicotine patches - apply one 21 mcg patch daily for the next 6 weeks, then decrease to one 14 mcg patch daily for 2 weeks then the 7 mcg patch daily for 2 weeks. Rotate site each day and apply on clean, dry skin. Goal quit date: 06/15/2022  Referral to lung cancer screening program - someone will contact you once it is closer to the  time of your next CT scan, which will likely be January 2025   Follow up in 6 weeks with Dr. Shearon Stalls or Joellen Jersey Ailie Gage,NP to see how albuterol is going and quitting smoking. If symptoms or worsen, please contact office for sooner follow up or seek emergency care.    DOE (dyspnea on exertion) Baseline DOE. Previous PFTs were nl and without formal diagnosis of COPD 11 months ago. I do not think we need to repeat at this point given her symptoms are stable. She did have some midflow reversibility. Possible asthmatic component? We will have her trial albuterol to see if she has any perceived benefit. Likely that symptoms are multifactorial and related to deconditioning and smoking. We discussed the risks of continued smoking and urged her to quit.   Cigarette smoker Active smoker. Smoking cessation strongly encouraged. Not  a candidate for chantix with CAD or Wellbutrin with current SSRI use. Opted for nicotine replacement therapy.  The patient's current tobacco use: 1 ppb The patient was advised to quit and impact of smoking on their health.  I assessed the patient's willingness to attempt to quit. I provided methods and skills for cessation. We reviewed medication management of smoking session drugs if appropriate. Resources to help quit smoking were provided. A smoking cessation quit date was set: 06/15/2022 Follow-up was arranged in our clinic.  The amount of time spent counseling patient was 5 mins    I spent 45 minutes of dedicated to the care of this patient on the date of this encounter to include pre-visit review of records, face-to-face time with the patient discussing conditions above, post visit ordering of testing, clinical documentation with the electronic health record, making appropriate referrals as documented, and communicating necessary findings to members of the patients care team.  Clayton Bibles, NP 05/16/2022  Pt aware and understands NP's role.

## 2022-05-15 NOTE — Patient Instructions (Addendum)
Trial albuterol 2 puffs every 6 hours as needed for shortness of breath or wheezing.   Previous pulmonary function testing showed normal lung function and you did not have a formal diagnosis of COPD.   Nicotine patches - apply one 21 mcg patch daily for the next 6 weeks, then decrease to one 14 mcg patch daily for 2 weeks then the 7 mcg patch daily for 2 weeks. Rotate site each day and apply on clean, dry skin. Goal quit date: 06/15/2022  Referral to lung cancer screening program - someone will contact you once it is closer to the time of your next CT scan, which will likely be January 2025   Follow up in 6 weeks with Dr. Shearon Stalls or Joellen Jersey Kadija Cruzen,NP to see how albuterol is going and quitting smoking. If symptoms or worsen, please contact office for sooner follow up or seek emergency care.

## 2022-05-15 NOTE — Progress Notes (Deleted)
Office Visit Note  Patient: [name]             Date of Birth: January 15, 1961           MRN: [id number]             PCP: [name], MD Referring: [name], MD Visit Date: 05/28/2022 Occupation: '@GUAROCC'$ @  Subjective:    History of Present Illness: [name] is a 62 y.o. female with history of osteoarthritis, osteoporosis, and DDD. She remains on forteo daily injections for management of osteoporosis.     Activities of Daily Living:  Patient reports morning stiffness for *** {minute/hour:19697}.   Patient {ACTIONS;DENIES/REPORTS:21021675::"Denies"} nocturnal pain.  Difficulty dressing/grooming: {ACTIONS;DENIES/REPORTS:21021675::"Denies"} Difficulty climbing stairs: {ACTIONS;DENIES/REPORTS:21021675::"Denies"} Difficulty getting out of chair: {ACTIONS;DENIES/REPORTS:21021675::"Denies"} Difficulty using hands for taps, buttons, cutlery, and/or writing: {ACTIONS;DENIES/REPORTS:21021675::"Denies"}  No Rheumatology ROS completed.   PMFS History:  Patient Active Problem List   Diagnosis Date Noted  . Osteoarthritis of right knee 12/07/2021  . Dermatographic urticaria 10/18/2021  . S/P lumbar laminectomy 09/18/2021  . Elevated parathyroid hormone 06/06/2021  . Osteoporosis of multiple sites without pathological fracture 06/06/2021  . Accelerating angina ([place]) 05/06/2021  . Complex regional pain syndrome of lower limb 03/07/2021  . Pelvic pain 12/14/2020  . RUQ pain 11/09/2020  . Elevated alkaline phosphatase level 11/09/2020  . Uncomplicated type 1 diabetes mellitus ([place]) 10/22/2020  . Lipoma of left upper extremity 09/04/2020  . Irritable bowel syndrome 09/03/2020  . Idiopathic peripheral neuropathy 08/29/2020  . Lipoma of abdominal wall 08/08/2020  . Fever 07/18/2020  . Hypotension 07/18/2020  . Sepsis ([place]) 07/18/2020  . Pneumonia 07/18/2020  . Arthritis 07/18/2020  . Blurred vision 07/11/2020  . Facial tingling 07/11/2020  . Diabetes mellitus  without complication ([place])   . Acute left ankle pain 03/09/2020  . History of pneumonia 01/25/2020  . History of sepsis 01/25/2020  . Respiratory failure ([place]) 01/13/2020  . HAP (hospital-acquired pneumonia) 01/13/2020  . Fusion of lumbar spine 01/11/2020  . Trigger thumb of right hand 11/14/2019  . Low back pain 10/22/2019  . Obsessive-compulsive disorder 10/14/2019  . Diabetes mellitus due to underlying condition with unspecified complications ([place]) [id number]  . Primary osteoarthritis involving multiple joints 08/24/2019  . Alcohol use 04/15/2019  . Elevated LFTs 03/10/2019  . Lumbar post-laminectomy syndrome 03/04/2019  . Acute bilateral thoracic back pain 12/03/2018  . Pain of left hip joint 07/23/2018  . Polyarthralgia 06/11/2018  . Bursitis of both hips 04/14/2018  . Left foot pain 01/28/2018  . CAD (coronary artery disease) 12/24/2017  . Secondary diabetes mellitus ([place]) 12/24/2017  . Coronary arteriosclerosis 12/24/2017  . Dyspnea on exertion 12/04/2017  . Attention deficit 12/04/2017  . Intermittent claudication ([place]) 12/04/2017  . History of metabolic disorder [id number]  . Lung mass 12/04/2017  . Myositis 12/04/2017  . Erythrocytosis 12/04/2017  . Senile purpura ([place]) 12/04/2017  . Chest pain 11/06/2017  . Multiple actinic keratoses 11/06/2017  . Degeneration of lumbar intervertebral disc 11/06/2017  . Fatigue 11/06/2017  . Insulin long-term use ([place]) 11/06/2017  . Menopausal disorder 11/06/2017  . Purpura ([place]) 11/06/2017  . Moderate chronic obstructive pulmonary disease ([place]) 11/06/2017  . Folic acid deficiency [id number]  . Malaise and fatigue 11/06/2017  . Arteriosclerotic vascular disease 11/06/2017  . Dysphagia 11/04/2017  . Sciatica 11/03/2017  . Scoliosis deformity of spine 11/03/2017  . Lumbar radiculopathy 05/26/2017  . Personal history of (healed) traumatic fracture 12/31/2016  . Deficiency of other specified B group vitamins  11/07/2016  . Mixed  hyperlipidemia 04/09/2016  . Smoking greater than 30 pack years 02/26/2016  . Cigarette smoker 02/26/2016  . Nicotine dependence, cigarettes, uncomplicated [id number]  . Diabetes mellitus type 1, uncomplicated ([place]) 0000000  . Tobacco abuse 02/11/2016  . Tobacco user 02/11/2016  . Pure hypercholesterolemia 02/11/2016  . Atherosclerosis of native coronary artery of native heart without angina pectoris 02/11/2016  . Coronary artery disease of native artery of native heart with stable angina pectoris ([place]) 01/25/2016  . Abnormal thyroid function test 12/06/2015  . Chronic bronchitis ([place]) 12/04/2015  . Overweight with body mass index (BMI) 25.0-29.9 07/11/2015  . BMI 27.0-27.9,adult 07/11/2015  . Pain in both lower extremities 07/10/2015  . Anxiety disorder 06/05/2015  . Atherosclerosis 06/05/2015  . Hemangioma 06/05/2015  . Primary insomnia 06/05/2015  . Trichotillomania 06/05/2015  . HNP (herniated nucleus pulposus), lumbar 05/10/2014    Past Medical History:  Diagnosis Date  . Abnormal thyroid function test 12/06/2015  . Accelerating angina ([place]) 05/06/2021  . Acute bilateral thoracic back pain 12/03/2018  . Acute left ankle pain 03/09/2020  . Alcohol use 04/15/2019   Formatting of this note might be different from the original. Recommend abstinence from alcohol  . Anxiety disorder 06/05/2015   Last Assessment & Plan:  Formatting of this note might be different from the original. Had started her on some xanax about week ago and she feels it has helped her and she is taking about half tablet for this and will continue with it as she has been compliant with it  . Arteriosclerotic vascular disease 11/06/2017  . Arthritis    back   . Atherosclerosis 06/05/2015   Formatting of this note might be different from the original. Seen on CTs of chest.  . Atherosclerosis of native coronary artery of native heart without angina pectoris 02/11/2016  . Attention deficit 12/04/2017  . Blurred  vision 07/11/2020  . BMI 27.0-27.9,adult 07/11/2015   Last Assessment & Plan:  Formatting of this note might be different from the original. Relevant Hx: Course: Daily Update: Today's Plan:difficult for her with the insulin as well as her abilify and the weight is increaseing for her rather than decreasing, she has cut her alcohol intake back, she has worked on her diet more with decreasing her carb intake as well and still difficulty discussed with   . Bursitis of both hips 04/14/2018  . CAD (coronary artery disease) 12/24/2017  . Chest pain 11/06/2017  . Chronic bronchitis ([place]) 12/04/2015   Formatting of this note might be different from the original. PFT ratio 88% FEV1 103% FVC 94% DLCO 76%  Last Assessment & Plan:  Formatting of this note might be different from the original. We discussed this as she has been coughing again for about 2 mnths and she sounds congested on exam with previous ease for pneumonia will place her on levaquin and have reviewed with her the importance of her   . Cigarette smoker 02/26/2016  . Complex regional pain syndrome of lower limb 03/07/2021  . Coronary arteriosclerosis 12/24/2017  . Coronary artery disease of native artery of native heart with stable angina pectoris ([place]) 01/25/2016   Formatting of this note might be different from the original. 2017:  50% LAD lesion.  Calcium noted other places.  Heart cath December 31, 2017 with a 30% ostial LAD lesion otherwise normal coronary arteries seen with LVEF 55 to 65%  . Deficiency of other specified B group vitamins 11/07/2016  . Degeneration of lumbar intervertebral  disc 11/06/2017  . Diabetes mellitus due to underlying condition with unspecified complications ([place]) [id number]  . Diabetes mellitus type 1, uncomplicated ([place]) 0000000   Added automatically from request for surgery [id number]  Formatting of this note might be different from the original. Added automatically from request for surgery [id number]  . Diabetes  mellitus without complication (H[facility])    Type 1- injections only- recent change to [place] with improved A1C reading.  Marland Kitchen Dysphagia 11/04/2017  . Dyspnea on exertion 12/04/2017  . Elevated alkaline phosphatase level 11/09/2020  . Elevated LFTs 03/10/2019   Formatting of this note might be different from the original. History.  Cannot change to this.  . Elevated parathyroid hormone 06/06/2021  . Erythrocytosis 12/04/2017  . Facial tingling 07/11/2020  . Fatigue 11/06/2017  . Fever 07/18/2020  . Folic acid deficiency [id number]  . Fusion of lumbar spine 01/11/2020  . HAP (hospital-acquired pneumonia) 01/13/2020  . Hemangioma 11/06/2017  . History of metabolic disorder [id number]  . History of pneumonia 01/25/2020   Formatting of this note might be different from the original. Recent mutifocal pneumonia September 2021 Formatting of this note might be different from the original. Formatting of this note might be different from the original. Recent mutifocal pneumonia September 2021  . History of sepsis 01/25/2020   Formatting of this note might be different from the original. Recent septic syndrome with multifocal pneumonia Formatting of this note might be different from the original. Formatting of this note might be different from the original. Recent septic syndrome with multifocal pneumonia  . HNP (herniated nucleus pulposus), lumbar 05/10/2014  . Hypotension 07/18/2020  . Idiopathic peripheral neuropathy 08/29/2020  . Insulin long-term use ([facility]) 11/06/2017  . Intermittent claudication ([place]) 12/04/2017  . Irritable bowel syndrome 09/03/2020  . Left foot pain 01/28/2018  . Lipoma of abdominal wall 08/08/2020  . Lipoma of left upper extremity 09/04/2020  . Low back pain 10/22/2019  . Lumbar post-laminectomy syndrome 03/04/2019  . Lumbar radiculopathy 05/26/2017  . Lung mass 12/04/2017  . Malaise and fatigue 11/06/2017  . Menopausal disorder 11/06/2017  . Mixed hyperlipidemia 04/09/2016    Formatting of this note might be different from the original. Added automatically from request for surgery [id number]  . Moderate chronic obstructive pulmonary disease ([place]) 11/06/2017  . Multiple actinic keratoses 11/06/2017  . Myositis 12/04/2017  . Nicotine dependence, cigarettes, uncomplicated [id number]  . Obsessive-compulsive disorder 10/14/2019   Formatting of this note might be different from the original. Formerly followed with [facility].  . Osteoporosis of multiple sites without pathological fracture 06/06/2021  . Overweight with body mass index (BMI) 25.0-29.9 07/11/2015   Last Assessment & Plan:  Relevant Hx: Course: Daily Update: Today's Plan:difficult for her with the insulin as well as her abilify and the weight is increaseing for her rather than decreasing, she has cut her alcohol intake back, she has worked on her diet more with decreasing her carb intake as well and still difficulty discussed with the saxenda and will try to get this approved for her and see   . Pain in both lower extremities 07/10/2015   Last Assessment & Plan:  Formatting of this note might be different from the original. Relevant Hx: Course: Daily Update: Today's Plan:she had pain to her right anterior thigh there is edema of both legs and she has fear for DVT which she is going to have [place] of and confirm ok, she has increased her weight which is part of her edema I believe as well as  her insulin usage. She is advised as well tha  . Pain of left hip joint 07/23/2018  . Pelvic pain 12/14/2020  . Personal history of (healed) traumatic fracture 12/31/2016  . Pneumonia   . Polyarthralgia 06/11/2018  . Primary insomnia 06/05/2015  . Primary osteoarthritis involving multiple joints 08/24/2019  . Pure hypercholesterolemia 02/11/2016  . Purpura ([place]) 11/06/2017  . Respiratory failure ([place]) 11/2018  . RUQ pain 11/09/2020  . S/P lumbar laminectomy 09/18/2021  . Sciatica 11/03/2017  . Scoliosis deformity of spine  11/03/2017  . Secondary diabetes mellitus ([place]) 12/24/2017  . Senile purpura ([place]) 12/04/2017  . Sepsis ([place]) 07/18/2020  . Smoking greater than 30 pack years 02/26/2016  . Tobacco abuse 02/11/2016  . Tobacco user 02/11/2016  . Trichotillomania    hx.   . Trigger thumb of right hand 11/14/2019  . Uncomplicated type 1 diabetes mellitus ([place]) 10/22/2020    Family History  Problem Relation Age of Onset  . Diabetes Mother   . Cancer Mother   . Hypertension Father   . Heart disease Father   . Diabetes Father   . Healthy Brother   . Healthy Son   . Diabetes Son   . Healthy Daughter    Past Surgical History:  Procedure Laterality Date  . ABDOMINAL EXPOSURE N/A 01/11/2020   Procedure: ABDOMINAL EXPOSURE;  Surgeon: Marty Heck, MD;  Location: [place];  Service: Vascular;  Laterality: N/A;  . ANTERIOR LUMBAR FUSION N/A 01/11/2020   Procedure: ANTERIOR LUMBAR INTERBODY FUSION  LUMBAR FIVE-SACRAL ONE, LEFT LUMBAR TWO-THREE FORAMINOTOMY, DISECTOMY;  Surgeon: Melina Schools, MD;  Location: [place];  Service: Orthopedics;  Laterality: N/A;  . APPENDECTOMY  04/28/1970  . BACK SURGERY  2016  . CARDIAC CATHETERIZATION  12/31/2017  . COLONOSCOPY  04/28/2017  . EXCISION/RELEASE BURSA HIP Left 04/14/2018   Procedure: Excision trochanteric bursa left hip;  Surgeon: Susa Day, MD;  Location: [facility];  Service: Orthopedics;  Laterality: Left;  60 mins  . HAND SURGERY    . HIP OPEN REDUCTION Right    ORIF  . INTRAVASCULAR PRESSURE WIRE/FFR STUDY N/A 05/06/2021   Procedure: INTRAVASCULAR PRESSURE WIRE/FFR STUDY;  Surgeon: Nelva Bush, MD;  Location: [facility];  Service: Cardiovascular;  Laterality: N/A;  . KNEE ARTHROSCOPY    . LEFT HEART CATH AND CORONARY ANGIOGRAPHY N/A 12/31/2017   Procedure: LEFT HEART CATH AND CORONARY ANGIOGRAPHY;  Surgeon: Martinique, Peter M, MD;  Location: [facility];  Service: Cardiovascular;  Laterality: N/A;  . LEFT HEART CATH AND CORONARY  ANGIOGRAPHY N/A 05/06/2021   Procedure: LEFT HEART CATH AND CORONARY ANGIOGRAPHY;  Surgeon: Nelva Bush, MD;  Location: [facility];  Service: Cardiovascular;  Laterality: N/A;  . LUMBAR LAMINECTOMY/DECOMPRESSION MICRODISCECTOMY Left 05/10/2014   Procedure: MICRODISCECTOMY LUMBAR DECOMPRESSION L5-S1 LEFT ;  Surgeon: Johnn Hai, MD;  Location: [facility];  Service: Orthopedics;  Laterality: Left;  . LUMBAR LAMINECTOMY/DECOMPRESSION MICRODISCECTOMY N/A 01/11/2020   Procedure: LUMBAR LAMINECTOMY/DECOMPRESSION MICRODISCECTOMY LEFT LUMBAR TWO-THREE;  Surgeon: Melina Schools, MD;  Location: [facility];  Service: Orthopedics;  Laterality: N/A;  . LUMBAR LAMINECTOMY/DECOMPRESSION MICRODISCECTOMY Right 09/18/2021   Procedure: Laminectomy and Foraminotomy - Lumbar Four-Lumbar Five - right;  Surgeon: Eustace Moore, MD;  Location: [facility];  Service: Neurosurgery;  Laterality: Right;  Laminectomy and Foraminotomy - Lumbar Four-Lumbar Five - right  . ORIF WRIST FRACTURE Right    retained hardware  . ORTHOPAEDIC SURGERY  04/14/2018  . TUBAL LIGATION  12/25/1995  Social History   Social History Narrative   ** Merged History Encounter **       Immunization History  Administered Date(s) Administered  . Influenza,inj,Quad PF,6+ Mos 01/21/2016, 12/31/2016, 02/09/2018, 01/18/2021  . Influenza,inj,quad, With Preservative 12/27/2016  . Influenza-Unspecified 01/21/2016, 12/31/2016  . Moderna Sars-Covid-2 Vaccination 04/29/2019, 05/30/2019  . Pneumococcal Conjugate-13 07/23/2017  . Pneumococcal Polysaccharide-23 02/01/2021  . Unspecified SARS-COV-2 Vaccination 02/28/2020     Objective: Vital Signs: LMP 05/04/2008    Physical Exam Vitals and nursing note reviewed.  Constitutional:      Appearance: She is well-developed.  HENT:     Head: Normocephalic and atraumatic.  Eyes:     Conjunctiva/sclera: Conjunctivae normal.  Cardiovascular:     Rate and Rhythm: Normal rate and regular rhythm.     Heart  sounds: Normal heart sounds.  Pulmonary:     Effort: Pulmonary effort is normal.     Breath sounds: Normal breath sounds.  Abdominal:     General: Bowel sounds are normal.     Palpations: Abdomen is soft.  Musculoskeletal:     Cervical back: Normal range of motion.  Skin:    General: Skin is warm and dry.     Capillary Refill: Capillary refill takes less than 2 seconds.  Neurological:     Mental Status: She is alert and oriented to person, place, and time.  Psychiatric:        Behavior: Behavior normal.     Musculoskeletal Exam: ***  CDAI Exam: CDAI Score: -- Patient Global: --; Provider Global: -- Swollen: --; Tender: -- Joint Exam 05/28/2022   No joint exam has been documented for this visit   There is currently no information documented on the homunculus. Go to the Rheumatology activity and complete the homunculus joint exam.  Investigation: No additional findings.  Imaging: No results found.  Recent Labs: Lab Results  Component Value Date   WBC 6.9 10/22/2021   HGB 13.5 10/22/2021   PLT 219 09/11/2021   NA 141 10/22/2021   K 4.1 10/22/2021   CL 102 10/22/2021   CO2 25 10/22/2021   GLUCOSE 141 (H) 10/22/2021   BUN 13 10/22/2021   CREATININE 0.64 10/22/2021   BILITOT 0.9 10/22/2021   ALKPHOS 109 10/22/2021   AST 22 10/22/2021   ALT 23 10/22/2021   PROT 5.9 (L) 10/22/2021   ALBUMIN 4.1 10/22/2021   CALCIUM 9.4 10/22/2021   GFRAA >60 01/21/2020    Speciality Comments: Avoid oral bisphosphonates-history of GER  Procedures:  No procedures performed Allergies: Celebrex [celecoxib] and Mobic [meloxicam]   Assessment / Plan:     Visit Diagnoses: No diagnosis found.  Orders: No orders of the defined types were placed in this encounter.  No orders of the defined types were placed in this encounter.   Face-to-face time spent with patient was *** minutes. Greater than 50% of time was spent in counseling and coordination of care.  Follow-Up  Instructions: No follow-ups on file.   Earnestine Mealing, CMA  Note - This record has been created using Editor, commissioning.  Chart creation errors have been sought, but may not always  have been located. Such creation errors do not reflect on  the standard of medical care.

## 2022-05-16 ENCOUNTER — Encounter: Payer: Self-pay | Admitting: Nurse Practitioner

## 2022-05-16 DIAGNOSIS — R0609 Other forms of dyspnea: Secondary | ICD-10-CM | POA: Insufficient documentation

## 2022-05-16 NOTE — Assessment & Plan Note (Signed)
Baseline DOE. Previous PFTs were nl and without formal diagnosis of COPD 11 months ago. I do not think we need to repeat at this point given her symptoms are stable. She did have some midflow reversibility. Possible asthmatic component? We will have her trial albuterol to see if she has any perceived benefit. Likely that symptoms are multifactorial and related to deconditioning and smoking. We discussed the risks of continued smoking and urged her to quit.

## 2022-05-16 NOTE — Assessment & Plan Note (Signed)
Active smoker. Smoking cessation strongly encouraged. Not a candidate for chantix with CAD or Wellbutrin with current SSRI use. Opted for nicotine replacement therapy.  The patient's current tobacco use: 1 ppb The patient was advised to quit and impact of smoking on their health.  I assessed the patient's willingness to attempt to quit. I provided methods and skills for cessation. We reviewed medication management of smoking session drugs if appropriate. Resources to help quit smoking were provided. A smoking cessation quit date was set: 06/15/2022 Follow-up was arranged in our clinic.  The amount of time spent counseling patient was 5 mins

## 2022-05-27 ENCOUNTER — Other Ambulatory Visit: Payer: Self-pay | Admitting: Pharmacist Clinician (PhC)/ Clinical Pharmacy Specialist

## 2022-05-27 DIAGNOSIS — E782 Mixed hyperlipidemia: Secondary | ICD-10-CM

## 2022-05-27 NOTE — Progress Notes (Signed)
Lipid labs

## 2022-05-28 ENCOUNTER — Ambulatory Visit: Payer: BC Managed Care – PPO | Admitting: Physician Assistant

## 2022-05-28 DIAGNOSIS — I25118 Atherosclerotic heart disease of native coronary artery with other forms of angina pectoris: Secondary | ICD-10-CM

## 2022-05-28 DIAGNOSIS — G8929 Other chronic pain: Secondary | ICD-10-CM

## 2022-05-28 DIAGNOSIS — L570 Actinic keratosis: Secondary | ICD-10-CM

## 2022-05-28 DIAGNOSIS — F633 Trichotillomania: Secondary | ICD-10-CM

## 2022-05-28 DIAGNOSIS — M810 Age-related osteoporosis without current pathological fracture: Secondary | ICD-10-CM

## 2022-05-28 DIAGNOSIS — E109 Type 1 diabetes mellitus without complications: Secondary | ICD-10-CM

## 2022-05-28 DIAGNOSIS — M4326 Fusion of spine, lumbar region: Secondary | ICD-10-CM

## 2022-05-28 DIAGNOSIS — F17210 Nicotine dependence, cigarettes, uncomplicated: Secondary | ICD-10-CM

## 2022-05-28 DIAGNOSIS — M65312 Trigger thumb, left thumb: Secondary | ICD-10-CM

## 2022-05-28 DIAGNOSIS — J449 Chronic obstructive pulmonary disease, unspecified: Secondary | ICD-10-CM

## 2022-05-28 DIAGNOSIS — M5136 Other intervertebral disc degeneration, lumbar region: Secondary | ICD-10-CM

## 2022-05-28 DIAGNOSIS — Z8619 Personal history of other infectious and parasitic diseases: Secondary | ICD-10-CM

## 2022-05-28 DIAGNOSIS — I7090 Unspecified atherosclerosis: Secondary | ICD-10-CM

## 2022-05-28 DIAGNOSIS — Z8719 Personal history of other diseases of the digestive system: Secondary | ICD-10-CM

## 2022-05-28 DIAGNOSIS — Z8659 Personal history of other mental and behavioral disorders: Secondary | ICD-10-CM

## 2022-05-28 DIAGNOSIS — M65311 Trigger thumb, right thumb: Secondary | ICD-10-CM

## 2022-05-28 DIAGNOSIS — E7800 Pure hypercholesterolemia, unspecified: Secondary | ICD-10-CM

## 2022-05-28 DIAGNOSIS — M25461 Effusion, right knee: Secondary | ICD-10-CM

## 2022-05-28 DIAGNOSIS — M1712 Unilateral primary osteoarthritis, left knee: Secondary | ICD-10-CM

## 2022-05-28 DIAGNOSIS — M1711 Unilateral primary osteoarthritis, right knee: Secondary | ICD-10-CM

## 2022-06-06 ENCOUNTER — Encounter: Payer: Self-pay | Admitting: Cardiology

## 2022-06-06 ENCOUNTER — Ambulatory Visit: Payer: BC Managed Care – PPO | Attending: Cardiology | Admitting: Cardiology

## 2022-06-06 VITALS — BP 118/80 | HR 81 | Ht 62.0 in | Wt 155.0 lb

## 2022-06-06 DIAGNOSIS — E088 Diabetes mellitus due to underlying condition with unspecified complications: Secondary | ICD-10-CM

## 2022-06-06 DIAGNOSIS — I2510 Atherosclerotic heart disease of native coronary artery without angina pectoris: Secondary | ICD-10-CM

## 2022-06-06 DIAGNOSIS — J449 Chronic obstructive pulmonary disease, unspecified: Secondary | ICD-10-CM | POA: Diagnosis not present

## 2022-06-06 DIAGNOSIS — F17210 Nicotine dependence, cigarettes, uncomplicated: Secondary | ICD-10-CM | POA: Diagnosis not present

## 2022-06-06 DIAGNOSIS — R0609 Other forms of dyspnea: Secondary | ICD-10-CM

## 2022-06-06 NOTE — Progress Notes (Unsigned)
Cardiology Office Note:    Date:  06/06/2022   ID:  [name], DOB [birth year], MRN [id number]  PCP:  [name], MD  Cardiologist:  Jenne Campus, MD    Referring MD: [name], MD     History of Present Illness:    [name] is a 62 y.o. female  ith past medical history significant for coronary artery disease.  In 2019 she did have cardiac catheterization performed which showed 30% stenosis of the ostial LAD.  Otherwise there was normal anatomy.  In summer 2021 she got coronary CT angio performed which showed coronary calcium score 253 she was find to have moderate 50 to 69% stenosis of the proximal LAD.  To look like fractional flow reserve was not performed after that.  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  No significant disease noted in the left circumflex or RCA.   She comes today to my office for follow-up.  Chief complaint is the fact that she was recently in the hospital. Overall she recovered nicely after hospitalization division of hospitalization with respiratory failure with pneumonia with sepsis.  Likely she is gradually getting better.  Denies having any typical chest pain tightness squeezing pressure burning chest.  She described to have some pain in the right upper portion of the chest not related to exercise lasting only for split-second.  She said her breathing is better.  She is also working hard on quitting smoking she smokes only 1 cigarettes a day.  Past Medical  History:  Diagnosis Date   Abnormal thyroid function test 12/06/2015   Accelerating angina ([place]) 05/06/2021   Acute bilateral thoracic back pain 12/03/2018   Acute left ankle pain 03/09/2020   Alcohol use 04/15/2019   Formatting of this note might be different from the original. Recommend abstinence from alcohol   Anxiety disorder 06/05/2015   Last Assessment & Plan:  Formatting of this note might be different from the original. Had started her on some xanax about week ago and she feels it has helped her and she is taking about half tablet for this and will continue with it as she has been compliant with it   Arteriosclerotic vascular disease 11/06/2017   Arthritis    back    Atherosclerosis 06/05/2015   Formatting of this note might be different from the original. Seen on CTs of chest.   Atherosclerosis of native coronary artery of native heart without angina pectoris 02/11/2016   Attention deficit 12/04/2017   Blurred vision 07/11/2020   BMI 27.0-27.9,adult 07/11/2015   Last Assessment & Plan:  Formatting of this note might be different from the original. Relevant Hx: Course: Daily Update: Today's Plan:difficult for her with the insulin as well as her abilify and the weight is increaseing for her rather than decreasing, she has cut her alcohol intake back, she has worked on her diet more with decreasing her carb intake as well and still difficulty discussed with    Bursitis of both hips 04/14/2018  CAD (coronary artery disease) 12/24/2017   Chest pain 11/06/2017   Chronic bronchitis ([place]) 12/04/2015   Formatting of this note might be different from the original. PFT ratio 88% FEV1 103% FVC 94% DLCO 76%  Last Assessment & Plan:  Formatting of this note might be different from the original. We discussed this as she has been coughing again for about 2 mnths and she sounds congested on exam with previous ease for pneumonia will place her on levaquin and have reviewed with her the importance of  her    Cigarette smoker 02/26/2016   Complex regional pain syndrome of lower limb 03/07/2021   Coronary arteriosclerosis 12/24/2017   Coronary artery disease of native artery of native heart with stable angina pectoris ([place]) 01/25/2016   Formatting of this note might be different from the original. 2017:  50% LAD lesion.  Calcium noted other places.  Heart cath December 31, 2017 with a 30% ostial LAD lesion otherwise normal coronary arteries seen with LVEF 55 to 65%   Deficiency of other specified B group vitamins 11/07/2016   Degeneration of lumbar intervertebral disc 11/06/2017   Diabetes mellitus due to underlying condition with unspecified complications ([place]) [id number]   Diabetes mellitus type 1, uncomplicated ([place]) 0000000   Added automatically from request for surgery [id number]  Formatting of this note might be different from the original. Added automatically from request for surgery [id number]   Diabetes mellitus without complication ([place])    Type 1- injections only- recent change to [place] with improved A1C reading.   Dysphagia 11/04/2017   Dyspnea on exertion 12/04/2017   Elevated alkaline phosphatase level 11/09/2020   Elevated LFTs 03/10/2019   Formatting of this note might be different from the original. History.  Cannot change to this.   Elevated parathyroid hormone 06/06/2021   Erythrocytosis 12/04/2017   Facial tingling 07/11/2020   Fatigue 11/06/2017   Fever [id number]   Folic acid deficiency [id number]   Fusion of lumbar spine 01/11/2020   HAP (hospital-acquired pneumonia) 01/13/2020   Hemangioma 11/06/2017   History of metabolic disorder [id number]   History of pneumonia 01/25/2020   Formatting of this note might be different from the original. Recent mutifocal pneumonia September 2021 Formatting of this note might be different from the original. Formatting of this note might be different from the original. Recent mutifocal pneumonia September 2021   History of sepsis  01/25/2020   Formatting of this note might be different from the original. Recent septic syndrome with multifocal pneumonia Formatting of this note might be different from the original. Formatting of this note might be different from the original. Recent septic syndrome with multifocal pneumonia   HNP (herniated nucleus pulposus), lumbar 05/10/2014   Hypotension 07/18/2020   Idiopathic peripheral neuropathy 08/29/2020   Insulin long-term use ([place]) 11/06/2017   Intermittent claudication ([place]) 12/04/2017   Irritable bowel syndrome 09/03/2020   Left foot pain 01/28/2018   Lipoma of abdominal wall 08/08/2020   Lipoma of left upper extremity 09/04/2020   Low back pain 10/22/2019   Lumbar post-laminectomy syndrome 03/04/2019   Lumbar radiculopathy 05/26/2017   Lung mass 12/04/2017   Malaise and fatigue 11/06/2017   Menopausal disorder 11/06/2017   Mixed hyperlipidemia 04/09/2016   Formatting of this note might be different from the original. Added automatically from request for surgery [id number]   Moderate chronic obstructive pulmonary disease ([place]) 11/06/2017   Multiple actinic keratoses 11/06/2017   Myositis 12/04/2017   Nicotine dependence, cigarettes, uncomplicated [id number]   Obsessive-compulsive disorder  10/14/2019   Formatting of this note might be different from the original. Formerly followed with [facility].   Osteoporosis of multiple sites without pathological fracture 06/06/2021   Overweight with body mass index (BMI) 25.0-29.9 07/11/2015   Last Assessment & Plan:  Relevant Hx: Course: Daily Update: Today's Plan:difficult for her with the insulin as well as her abilify and the weight is increaseing for her rather than decreasing, she has cut her alcohol intake back, she has worked on her diet more with decreasing her carb intake as well and still difficulty discussed with the saxenda and will try to get this approved for her and see    Pain in both lower extremities 07/10/2015   Last  Assessment & Plan:  Formatting of this note might be different from the original. Relevant Hx: Course: Daily Update: Today's Plan:she had pain to her right anterior thigh there is edema of both legs and she has fear for DVT which she is going to have [place] of and confirm ok, she has increased her weight which is part of her edema I believe as well as her insulin usage. She is advised as well tha   Pain of left hip joint 07/23/2018   Pelvic pain 12/14/2020   Personal history of (healed) traumatic fracture 12/31/2016   Pneumonia    Polyarthralgia 06/11/2018   Primary insomnia 06/05/2015   Primary osteoarthritis involving multiple joints 08/24/2019   Pure hypercholesterolemia 02/11/2016   Purpura ([place]) 11/06/2017   Respiratory failure ([place]) 11/2018   RUQ pain 11/09/2020   S/P lumbar laminectomy 09/18/2021   Sciatica 11/03/2017   Scoliosis deformity of spine 11/03/2017   Secondary diabetes mellitus ([place]) 12/24/2017   Senile purpura ([place]) 12/04/2017   Sepsis ([place]) 07/18/2020   Smoking greater than 30 pack years 02/26/2016   Tobacco abuse 02/11/2016   Tobacco user 02/11/2016   Trichotillomania    hx.    Trigger thumb of right hand [id number]   Uncomplicated type 1 diabetes mellitus ([place]) 10/22/2020    Past Surgical History:  Procedure Laterality Date   ABDOMINAL EXPOSURE N/A 01/11/2020   Procedure: ABDOMINAL EXPOSURE;  Surgeon: Marty Heck, MD;  [facility];  Service: Vascular;  Laterality: N/A;   ANTERIOR LUMBAR FUSION N/A 01/11/2020   Procedure: ANTERIOR LUMBAR INTERBODY FUSION  LUMBAR FIVE-SACRAL ONE, LEFT LUMBAR TWO-THREE FORAMINOTOMY, DISECTOMY;  Surgeon: Melina Schools, MD;  Location: [place];  Service: Orthopedics;  Laterality: N/A;   APPENDECTOMY  04/28/1970   BACK SURGERY  2016   CARDIAC CATHETERIZATION  12/31/2017   COLONOSCOPY  04/28/2017   EXCISION/RELEASE BURSA HIP Left 04/14/2018   Procedure: Excision trochanteric bursa left hip;  Surgeon: Susa Day, MD;   Location: [facility] ORS;  Service: Orthopedics;  Laterality: Left;  60 mins   HAND SURGERY     HIP OPEN REDUCTION Right    ORIF   INTRAVASCULAR PRESSURE WIRE/FFR STUDY N/A 05/06/2021   Procedure: INTRAVASCULAR PRESSURE WIRE/FFR STUDY;  Surgeon: Nelva Bush, MD;  Location: [place] CV LAB;  Service: Cardiovascular;  Laterality: N/A;   KNEE ARTHROSCOPY     LEFT HEART CATH AND CORONARY ANGIOGRAPHY N/A 12/31/2017   Procedure: LEFT HEART CATH AND CORONARY ANGIOGRAPHY;  Surgeon: Martinique, Peter M, MD;  Location: [place] [facility];  Service: Cardiovascular;  Laterality: N/A;   LEFT HEART CATH AND CORONARY ANGIOGRAPHY N/A 05/06/2021   Procedure: LEFT HEART CATH AND CORONARY ANGIOGRAPHY;  Surgeon: Nelva Bush, MD;  Location: [place] [facility];  Service: Cardiovascular;  Laterality: N/A;  LUMBAR LAMINECTOMY/DECOMPRESSION MICRODISCECTOMY Left 05/10/2014   Procedure: MICRODISCECTOMY LUMBAR DECOMPRESSION L5-S1 LEFT ;  Surgeon: Johnn Hai, MD;  Location: [facility];  Service: Orthopedics;  Laterality: Left;   LUMBAR LAMINECTOMY/DECOMPRESSION MICRODISCECTOMY N/A 01/11/2020   Procedure: LUMBAR LAMINECTOMY/DECOMPRESSION MICRODISCECTOMY LEFT LUMBAR TWO-THREE;  Surgeon: Melina Schools, MD;  Location: [place];  Service: Orthopedics;  Laterality: N/A;   LUMBAR LAMINECTOMY/DECOMPRESSION MICRODISCECTOMY Right 09/18/2021   Procedure: Laminectomy and Foraminotomy - Lumbar Four-Lumbar Five - right;  Surgeon: Eustace Moore, MD;  Location: [place];  Service: Neurosurgery;  Laterality: Right;  Laminectomy and Foraminotomy - Lumbar Four-Lumbar Five - right   ORIF WRIST FRACTURE Right    retained hardware   ORTHOPAEDIC SURGERY  04/14/2018   TUBAL LIGATION  12/25/1995    Current Medications: Current Meds  Medication Sig   acetaminophen (TYLENOL) 500 MG tablet Take 500 mg by mouth daily as needed for mild pain.   ALPRAZolam (XANAX) 0.5 MG tablet Take 0.5 mg by mouth at bedtime.    aspirin EC 81 MG tablet Take 1 tablet (81 mg  total) by mouth daily. Swallow whole.   atorvastatin (LIPITOR) 80 MG tablet Take 80 mg by mouth daily.   cyanocobalamin (,VITAMIN B-12,) 1000 MCG/ML injection Inject 1,000 mcg into the muscle every 14 (fourteen) days.   Evolocumab (REPATHA SURECLICK) [id number] MG/ML SOAJ Inject 140 mg into the skin every 14 (fourteen) days.   finasteride (PROSCAR) 5 MG tablet Take 2.5 mg by mouth every evening.   FLUoxetine (PROZAC) 40 MG capsule Take 80 mg by mouth every evening.   folic acid (FOLVITE) 1 MG tablet Take 1 mg by mouth at bedtime.    FORTEO 600 MCG/2.4ML SOPN INJECT 20 MCG SUBCUTANEOUSLY ONCE DAILY. REFRIGERATE AND DISCARD PEN 28 DAYS AFTER INITIAL USE. (Patient taking differently: Inject 20 mcg into the skin every 28 (twenty-eight) days.)   furosemide (LASIX) 40 MG tablet Take 20-40 mg by mouth daily as needed for fluid.   insulin aspart (NOVOLOG FLEXPEN) 100 UNIT/ML FlexPen Inject 0-10 Units into the skin 3 (three) times daily with meals. Per sliding scale   insulin degludec (TRESIBA FLEXTOUCH) 200 UNIT/ML FlexTouch Pen Inject 32 Units into the skin in the morning.   Insulin Pen Needle 31G X 5 MM MISC Use to inject Forteo once daily (Patient taking differently: 1 each by Other route See admin instructions. Use to inject Forteo once daily)   linaclotide (LINZESS) 290 MCG CAPS capsule Take 290 mcg by mouth at bedtime.    nitroGLYCERIN (NITROSTAT) 0.4 MG SL tablet Place 1 tablet (0.4 mg total) under the tongue every 5 (five) minutes as needed for chest pain.   pantoprazole (PROTONIX) 40 MG tablet Take 40 mg by mouth at bedtime.   Semaglutide,0.25 or 0.5MG/DOS, (OZEMPIC, 0.25 OR 0.5 MG/DOSE,) 2 MG/3ML SOPN Inject 0.5 mg into the skin every Wednesday.     Allergies:   Celebrex [celecoxib] and Mobic [meloxicam]   Social History   Socioeconomic History   Marital status: Divorced    Spouse name: Not on file   Number of children: Not on file   Years of education: Not on file   Highest education level:  Not on file  Occupational History   Not on file  Tobacco Use   Smoking status: Every Day    Packs/day: 1.00    Years: 45.00    Total pack years: 45.00    Types: Cigarettes    Start date: 09/24/1977    Passive exposure: Current   Smokeless  tobacco: Never   Tobacco comments:    Pt states 0.5-1ppd.     Verified [facility] 05/15/2022  Vaping Use   Vaping Use: Never used  Substance and Sexual Activity   Alcohol use: Yes    Alcohol/week: 6.0 standard drinks of alcohol    Types: 6 Cans of beer per week    Comment: weekends - social   Drug use: No   Sexual activity: Not Currently  Other Topics Concern   Not on file  Social History Narrative   ** Merged History Encounter **       Social Determinants of Health   Financial Resource Strain: Not on file  Food Insecurity: Not on file  Transportation Needs: Not on file  Physical Activity: Not on file  Stress: Not on file  Social Connections: Not on file     Family History: The patient's family history includes Cancer in her mother; Diabetes in her father, mother, and son; Healthy in her brother, daughter, and son; Heart disease in her father; Hypertension in her father. ROS:   Please see the history of present illness.    All 14 point review of systems negative except as described per history of present illness  EKGs/Labs/Other Studies Reviewed:      Recent Labs: 09/11/2021: Platelets 219 10/22/2021: ALT 23; BUN 13; Creatinine, Ser 0.64; Hemoglobin 13.5; Potassium 4.1; Sodium 141  Recent Lipid Panel    Component Value Date/Time   CHOL 186 10/11/2019 0950   TRIG 136 10/11/2019 0950   HDL 49 10/11/2019 0950   CHOLHDL 3.8 10/11/2019 0950   LDLCALC 113 (H) 10/11/2019 0950    Physical Exam:    VS:  BP 118/80 (BP Location: Left Arm, Patient Position: Sitting)   Pulse 81   Ht 5' 2"$  (1.575 m)   Wt 155 lb (70.3 kg)   LMP 05/04/2008   SpO2 99%   BMI 28.35 kg/m     Wt Readings from Last 3 Encounters:  06/06/22 155 lb (70.3 kg)   05/15/22 150 lb (68 kg)  04/25/22 150 lb (68 kg)     GEN:  Well nourished, well developed in no acute distress HEENT: Normal NECK: No JVD; No carotid bruits LYMPHATICS: No lymphadenopathy CARDIAC: RRR, no murmurs, no rubs, no gallops RESPIRATORY:  Clear to auscultation without rales, wheezing or rhonchi  ABDOMEN: Soft, non-tender, non-distended MUSCULOSKELETAL:  No edema; No deformity  SKIN: Warm and dry LOWER EXTREMITIES: no swelling NEUROLOGIC:  Alert and oriented x 3 PSYCHIATRIC:  Normal affect   ASSESSMENT:    1. Coronary artery disease involving native coronary artery of native heart without angina pectoris   2. Moderate chronic obstructive pulmonary disease ([place])   3. Diabetes mellitus due to underlying condition with unspecified complications ([place])   4. Cigarette smoker   5. Dyspnea on exertion    PLAN:    In order of problems listed above:  Coronary artery disease: Stable from that point review denies have any symptoms that would suggest problem. Moderate chronic obstructive pulmonary disease.  Stable. Dyslipidemia, last cholesterol I have is from April 03, 2022 with direct LDL of 54 excellent cholesterol control she is on Repatha as well as Lipitor which I will continue. Cigarette smoking she smokes only 1 cigarette today I told her ultimate goal will be discontinued she is working on it. Overall she is recovering quite nicely from quite significant sickness.  I encouraged her to BL be more active.  And I encouraged her to quit smoking. I did  review record from hospital for this visit   Medication Adjustments/Labs and Tests Ordered: Current medicines are reviewed at length with the patient today.  Concerns regarding medicines are outlined above.  No orders of the defined types were placed in this encounter.  Medication changes: No orders of the defined types were placed in this encounter.   Signed, Park Liter, MD, [facility] 06/06/2022 2:05 PM    [place]

## 2022-06-06 NOTE — Patient Instructions (Addendum)
Medication Instructions:   STOP: Metoprolol   Lab Work: None Ordered If you have labs (blood work) drawn today and your tests are completely normal, you will receive your results only by: [place] (if you have MyChart) OR A paper copy in the mail If you have any lab test that is abnormal or we need to change your treatment, we will call you to review the results.   Testing/Procedures: None Ordered   Follow-Up: At [facility], you and your health needs are our priority.  As part of our continuing mission to provide you with exceptional heart care, we have created designated Provider Care Teams.  These Care Teams include your primary Cardiologist (physician) and Advanced Practice Providers (APPs -  Physician Assistants and Nurse Practitioners) who all work together to provide you with the care you need, when you need it.  We recommend signing up for the patient portal called "MyChart".  Sign up information is provided on this After Visit Summary.  MyChart is used to connect with patients for Virtual Visits (Telemedicine).  Patients are able to view lab/test results, encounter notes, upcoming appointments, etc.  Non-urgent messages can be sent to your provider as well.   To learn more about what you can do with MyChart, go to [email or web address].    Your next appointment:   6 month(s)  The format for your next appointment:   In Person  Provider:   Jenne Campus, MD    Other Instructions  This visit was accompanied by Truddie Hidden, RN.  FDA-cleared personal EKG: The world's most clinically validated personal EKG, FDA-cleared to detect Atrial Fibrillation, Bradycardia, and Tachycardia. Evalee Mutton is the most reliable way to check in on your heart from home. Take your EKG from anywhere: Capture a medical-grade EKG in 30 seconds and get an instant analysis right on your smartphone. Evalee Mutton is small enough to fit in your pocket, so you can take it with you  anywhere. Easy to use: Simply place your fingers on the sensors--no wires, patches, or gels. Recommended by doctors: A trusted resource, Evalee Mutton is the #1 doctor-recommended personal EKG with more than 100 million EKGs recorded. Save or share your EKGs: With the press of a button, email your EKGs to your doctor or save them on your phone. Works with smartphones: Compatible with Tour manager and tablets. Check our compatibility chart. FSA/HSA eligible: Purchase using an FSA or HSA account (please confirm coverage with your insurance provider). Phone clip included with purchase, a $15 value. Conveniently take your device with you wherever you go.  [email or web address]://[email or web address].tn   Step One- Record your EKG strip on [facility] app.   Step two- On Kardia EKG click "Download"   Step three- It will prompt you to make a password for this EKG. Please make the password "[name]" so that we can view it.   Step four- Click on the little "upload" button (small box with an arrow in the middle) in the bottom left-hand corner of the screen.   Step five- Click "Save to Files"  Step six- Click on "On my iphone" and then "Pages" then press save in the top right-hand corner.   NOW GO TO MYCHART   Once on MyChart click "Messages"  Step one- Click "Send a message"  Step two- Click "Ask a medical question"   Step three- Click "Non urgent medical question"   Step four- Click on [name]'s name.  Step five- Click on the small paperclip at the bottom of  the screen  Step six- Click "Choose file"  Step seven- Pick the most recent EKG strip listed.   Once uploaded send the message!

## 2022-06-06 NOTE — Progress Notes (Unsigned)
This visit was accompanied by Truddie Hidden, RN.

## 2022-06-30 ENCOUNTER — Ambulatory Visit: Payer: BC Managed Care – PPO | Admitting: Internal Medicine

## 2022-07-03 ENCOUNTER — Other Ambulatory Visit: Payer: Self-pay | Admitting: Physician Assistant

## 2022-07-03 DIAGNOSIS — M810 Age-related osteoporosis without current pathological fracture: Secondary | ICD-10-CM

## 2022-07-03 NOTE — Telephone Encounter (Signed)
Next Visit: 09/02/2022  Last Visit: 04/25/2022  Last Fill: 02/06/2022  DX: Age-related osteoporosis without current pathological fracture   Current Dose per office note 04/25/2022: started on Forteo daily injections on 09/30/21   Labs: 04/30/2022 Glucose 53 Alk. Phos 110, RBC 3.95, MCV 97.1, MCH 34.0  Okay to refill Forteo?

## 2022-07-15 ENCOUNTER — Ambulatory Visit: Payer: BC Managed Care – PPO | Admitting: Cardiology

## 2022-07-24 ENCOUNTER — Telehealth: Payer: Self-pay | Admitting: *Deleted

## 2022-07-24 NOTE — Telephone Encounter (Signed)
Patient calling regarding copay for Forteo and patient assistance. Please return patient's call. Thank you.

## 2022-07-25 NOTE — Telephone Encounter (Signed)
Returned call to patient - unable to reach. Left VM requesting return call  Knox Saliva, PharmD, MPH, BCPS, CPP Clinical Pharmacist (Rheumatology and Pulmonology)

## 2022-07-28 NOTE — Telephone Encounter (Signed)
Patient returned call and stated that her copay for Forteo has gone up to $350. She was not able to advise if this is before oe after copay card as she did not inquire. Please call pharmacy to determine if copay card needs to be reactivated or if her copay has increased.  She may have to switch to generic for Roxanne Gates, PharmD, MPH, BCPS, CPP Clinical Pharmacist (Rheumatology and Pulmonology)

## 2022-07-28 NOTE — Telephone Encounter (Signed)
New copay card info obtained and provided to pharmacy. Called pt and notified that copay was once again $4, however she would need to call back and reschedule shipment. Also advised to double check that they were working on retro-billing her previous Rx. Pt verbalized understanding to all.  Copay card info:  RxBIN: [id number] RxPCN: [id number] RxGRP: [id number] ID: [id number] Exp: 04/28/2023

## 2022-08-06 ENCOUNTER — Other Ambulatory Visit: Payer: Self-pay | Admitting: *Deleted

## 2022-08-06 DIAGNOSIS — M810 Age-related osteoporosis without current pathological fracture: Secondary | ICD-10-CM

## 2022-08-06 MED ORDER — INSULIN PEN NEEDLE 31G X 5 MM MISC: 2 refills | Status: AC

## 2022-08-06 NOTE — Telephone Encounter (Signed)
Refill request received via fax from [facility] for BD Pen needles   Last Fill: 09/26/2021  Next Visit: 09/02/2022  Last Visit: 04/15/2022  Dx: Age-related osteoporosis without current pathological fracture   Current Dose per office note on 04/25/2022: Forteo daily injections   Okay to refill BD Pen Needles?

## 2022-08-11 NOTE — Progress Notes (Signed)
Office Visit Note  Patient: [name]             Date of Birth: [birth year]           MRN: [id number]             PCP: Gordan Payment., MD Referring: Gordan Payment., MD Visit Date: 08/12/2022 Occupation: @GUAROCC @  Subjective:  Right knee pain  History of Present Illness: [name] is a 62 y.o. female with history of osteoarthritis, degenerative disc disease and osteoporosis.  She states for the last few weeks she has been having pain and discomfort in her right knee joint.  She had right knee joint problems back in November 2023.  At the time x-ray of her right knee joint showed mild degenerative changes and chondromalacia patella.  She had right knee joint cortisone injection by Ladona Ridgel and November 2023.  She had good response to the cortisone injection.  Patient also recalls having MRI of her right knee joint at [facility] but I do not have the results available.  She states she has discomfort climbing stairs.  She continues to have discomfort in her bilateral shoulders.  She states she had MRI of her shoulder joints which showed torn rotator cuff tear.  She has been followed by orthopedic surgeon.  She has been experiencing some discomfort in her arms.  She continues to have discomfort in her bilateral hands.  She has chronic lower back pain due to underlying degenerative disc disease.    Activities of Daily Living:  Patient reports morning stiffness for all day. Patient Reports nocturnal pain.  Difficulty dressing/grooming: Denies Difficulty climbing stairs: Reports Difficulty getting out of chair: Denies Difficulty using hands for taps, buttons, cutlery, and/or writing: Denies  Review of Systems  Constitutional:  Positive for fatigue.  HENT:  Negative for mouth sores and mouth dryness.   Eyes:  Negative for dryness.  Respiratory:  Negative for difficulty breathing.   Cardiovascular:  Negative for chest pain and palpitations.  Gastrointestinal:  Positive for  diarrhea. Negative for blood in stool and constipation.  Endocrine: Negative for increased urination.  Genitourinary:  Negative for involuntary urination.  Musculoskeletal:  Positive for joint pain, joint pain, myalgias, muscle weakness, morning stiffness, muscle tenderness and myalgias. Negative for gait problem and joint swelling.  Skin:  Negative for color change, rash and sensitivity to sunlight.  Allergic/Immunologic: Negative for susceptible to infections.  Neurological:  Positive for headaches. Negative for dizziness.  Hematological:  Negative for swollen glands.  Psychiatric/Behavioral:  Positive for sleep disturbance. Negative for depressed mood. The patient is not nervous/anxious.     PMFS History:  Patient Active Problem List   Diagnosis Date Noted   DOE (dyspnea on exertion) 05/16/2022   Influenzal pneumonia 05/15/2022   Osteoarthritis of right knee 12/07/2021   Dermatographic urticaria 10/18/2021   S/P lumbar laminectomy 09/18/2021   Elevated parathyroid hormone 06/06/2021   Osteoporosis of multiple sites without pathological fracture 06/06/2021   Accelerating angina 05/06/2021   Complex regional pain syndrome of lower limb 03/07/2021   Pelvic pain 12/14/2020   RUQ pain 11/09/2020   Elevated alkaline phosphatase level 11/09/2020   Uncomplicated type 1 diabetes mellitus 10/22/2020   Lipoma of left upper extremity 09/04/2020   Irritable bowel syndrome 09/03/2020   Idiopathic peripheral neuropathy 08/29/2020   Lipoma of abdominal wall 08/08/2020   Fever 07/18/2020   Hypotension 07/18/2020   Sepsis 07/18/2020   Pneumonia 07/18/2020   Arthritis 07/18/2020  Blurred vision 07/11/2020   Facial tingling 07/11/2020   Diabetes mellitus without complication    Acute left ankle pain 03/09/2020   History of pneumonia 01/25/2020   History of sepsis 01/25/2020   Respiratory failure 01/13/2020   HAP (hospital-acquired pneumonia) 01/13/2020   Fusion of lumbar spine 01/11/2020    Trigger thumb of right hand 11/14/2019   Low back pain 10/22/2019   Obsessive-compulsive disorder 10/14/2019   Diabetes mellitus due to underlying condition with unspecified complications 09/15/2019   Primary osteoarthritis involving multiple joints 08/24/2019   Alcohol use 04/15/2019   Elevated LFTs 03/10/2019   Lumbar post-laminectomy syndrome 03/04/2019   Acute bilateral thoracic back pain 12/03/2018   Pain of left hip joint 07/23/2018   Polyarthralgia 06/11/2018   Bursitis of both hips 04/14/2018   Left foot pain 01/28/2018   CAD (coronary artery disease) 12/24/2017   Secondary diabetes mellitus 12/24/2017   Coronary arteriosclerosis 12/24/2017   Dyspnea on exertion 12/04/2017   Attention deficit 12/04/2017   Intermittent claudication 12/04/2017   History of metabolic disorder 12/04/2017   Lung mass 12/04/2017   Myositis 12/04/2017   Erythrocytosis 12/04/2017   Senile purpura 12/04/2017   Chest pain 11/06/2017   Multiple actinic keratoses 11/06/2017   Degeneration of lumbar intervertebral disc 11/06/2017   Fatigue 11/06/2017   Insulin long-term use 11/06/2017   Menopausal disorder 11/06/2017   Purpura 11/06/2017   Moderate chronic obstructive pulmonary disease 11/06/2017   Folic acid deficiency 11/06/2017   Malaise and fatigue 11/06/2017   Arteriosclerotic vascular disease 11/06/2017   Dysphagia 11/04/2017   Sciatica 11/03/2017   Scoliosis deformity of spine 11/03/2017   Lumbar radiculopathy 05/26/2017   Personal history of (healed) traumatic fracture 12/31/2016   Deficiency of other specified B group vitamins 11/07/2016   Mixed hyperlipidemia 04/09/2016   Smoking greater than 30 pack years 02/26/2016   Cigarette smoker 02/26/2016   Nicotine dependence, cigarettes, uncomplicated 02/26/2016   Diabetes mellitus type 1, uncomplicated 02/11/2016   Tobacco abuse 02/11/2016   Tobacco user 02/11/2016   Pure hypercholesterolemia 02/11/2016   Atherosclerosis of native  coronary artery of native heart without angina pectoris 02/11/2016   Coronary artery disease of native artery of native heart with stable angina pectoris 01/25/2016   Abnormal thyroid function test 12/06/2015   Chronic bronchitis 12/04/2015   Overweight with body mass index (BMI) 25.0-29.9 07/11/2015   BMI 27.0-27.9,adult 07/11/2015   Pain in both lower extremities 07/10/2015   Anxiety disorder 06/05/2015   Atherosclerosis 06/05/2015   Hemangioma 06/05/2015   Primary insomnia 06/05/2015   Trichotillomania 06/05/2015   HNP (herniated nucleus pulposus), lumbar 05/10/2014    Past Medical History:  Diagnosis Date   Abnormal thyroid function test 12/06/2015   Accelerating angina 05/06/2021   Acute bilateral thoracic back pain 12/03/2018   Acute left ankle pain 03/09/2020   Alcohol use 04/15/2019   Formatting of this note might be different from the original. Recommend abstinence from alcohol   Anxiety disorder 06/05/2015   Last Assessment & Plan:  Formatting of this note might be different from the original. Had started her on some xanax about week ago and she feels it has helped her and she is taking about half tablet for this and will continue with it as she has been compliant with it   Arteriosclerotic vascular disease 11/06/2017   Arthritis    back    Atherosclerosis 06/05/2015   Formatting of this note might be different from the original. Seen on CTs of chest.  Atherosclerosis of native coronary artery of native heart without angina pectoris 02/11/2016   Attention deficit 12/04/2017   Blurred vision 07/11/2020   BMI 27.0-27.9,adult 07/11/2015   Last Assessment & Plan:  Formatting of this note might be different from the original. Relevant Hx: Course: Daily Update: Today's Plan:difficult for her with the insulin as well as her abilify and the weight is increaseing for her rather than decreasing, she has cut her alcohol intake back, she has worked on her diet more with decreasing her  carb intake as well and still difficulty discussed with    Bursitis of both hips 04/14/2018   CAD (coronary artery disease) 12/24/2017   Chest pain 11/06/2017   Chronic bronchitis 12/04/2015   Formatting of this note might be different from the original. PFT ratio 88% FEV1 103% FVC 94% DLCO 76%  Last Assessment & Plan:  Formatting of this note might be different from the original. We discussed this as she has been coughing again for about 2 mnths and she sounds congested on exam with previous ease for pneumonia will place her on levaquin and have reviewed with her the importance of her    Cigarette smoker 02/26/2016   Complex regional pain syndrome of lower limb 03/07/2021   Coronary arteriosclerosis 12/24/2017   Coronary artery disease of native artery of native heart with stable angina pectoris 01/25/2016   Formatting of this note might be different from the original. 2017:  50% LAD lesion.  Calcium noted other places.  Heart cath December 31, 2017 with a 30% ostial LAD lesion otherwise normal coronary arteries seen with LVEF 55 to 65%   Deficiency of other specified B group vitamins 11/07/2016   Degeneration of lumbar intervertebral disc 11/06/2017   Diabetes mellitus due to underlying condition with unspecified complications 09/15/2019   Diabetes mellitus type 1, uncomplicated 02/11/2016   Added automatically from request for surgery [id number]  Formatting of this note might be different from the original. Added automatically from request for surgery [id number]   Diabetes mellitus without complication    Type 1- injections only- recent change to [place]o with improved A1C reading.   Dysphagia 11/04/2017   Dyspnea on exertion 12/04/2017   Elevated alkaline phosphatase level 11/09/2020   Elevated LFTs 03/10/2019   Formatting of this note might be different from the original. History.  Cannot change to this.   Elevated parathyroid hormone 06/06/2021   Erythrocytosis 12/04/2017   Facial tingling  07/11/2020   Fatigue 11/06/2017   Fever 07/18/2020   Folic acid deficiency 11/06/2017   Fusion of lumbar spine 01/11/2020   HAP (hospital-acquired pneumonia) 01/13/2020   Hemangioma 11/06/2017   History of metabolic disorder 12/04/2017   History of pneumonia 01/25/2020   Formatting of this note might be different from the original. Recent mutifocal pneumonia September 2021 Formatting of this note might be different from the original. Formatting of this note might be different from the original. Recent mutifocal pneumonia September 2021   History of sepsis 01/25/2020   Formatting of this note might be different from the original. Recent septic syndrome with multifocal pneumonia Formatting of this note might be different from the original. Formatting of this note might be different from the original. Recent septic syndrome with multifocal pneumonia   HNP (herniated nucleus pulposus), lumbar 05/10/2014   Hypotension 07/18/2020   Idiopathic peripheral neuropathy 08/29/2020   Insulin long-term use 11/06/2017   Intermittent claudication 12/04/2017   Irritable bowel syndrome 09/03/2020   Left foot pain 01/28/2018  Lipoma of abdominal wall 08/08/2020   Lipoma of left upper extremity 09/04/2020   Low back pain 10/22/2019   Lumbar post-laminectomy syndrome 03/04/2019   Lumbar radiculopathy 05/26/2017   Lung mass 12/04/2017   Malaise and fatigue 11/06/2017   Menopausal disorder 11/06/2017   Mixed hyperlipidemia 04/09/2016   Formatting of this note might be different from the original. Added automatically from request for surgery [id number]   Moderate chronic obstructive pulmonary disease 11/06/2017   Multiple actinic keratoses 11/06/2017   Myositis 12/04/2017   Nicotine dependence, cigarettes, uncomplicated 02/26/2016   Obsessive-compulsive disorder 10/14/2019   Formatting of this note might be different from the original. Formerly followed with [facility].   Osteoporosis of multiple sites  without pathological fracture 06/06/2021   Overweight with body mass index (BMI) 25.0-29.9 07/11/2015   Last Assessment & Plan:  Relevant Hx: Course: Daily Update: Today's Plan:difficult for her with the insulin as well as her abilify and the weight is increaseing for her rather than decreasing, she has cut her alcohol intake back, she has worked on her diet more with decreasing her carb intake as well and still difficulty discussed with the saxenda and will try to get this approved for her and see    Pain in both lower extremities 07/10/2015   Last Assessment & Plan:  Formatting of this note might be different from the original. Relevant Hx: Course: Daily Update: Today's Plan:she had pain to her right anterior thigh there is edema of both legs and she has fear for DVT which she is going to have [place] of and confirm ok, she has increased her weight which is part of her edema I believe as well as her insulin usage. She is advised as well tha   Pain of left hip joint 07/23/2018   Pelvic pain 12/14/2020   Personal history of (healed) traumatic fracture 12/31/2016   Pneumonia    Polyarthralgia 06/11/2018   Primary insomnia 06/05/2015   Primary osteoarthritis involving multiple joints 08/24/2019   Pure hypercholesterolemia 02/11/2016   Purpura 11/06/2017   Respiratory failure 11/2018   RUQ pain 11/09/2020   S/P lumbar laminectomy 09/18/2021   Sciatica 11/03/2017   Scoliosis deformity of spine 11/03/2017   Secondary diabetes mellitus 12/24/2017   Senile purpura 12/04/2017   Sepsis 07/18/2020   Smoking greater than 30 pack years 02/26/2016   Tobacco abuse 02/11/2016   Tobacco user 02/11/2016   Trichotillomania    hx.    Trigger thumb of right hand 11/14/2019   Uncomplicated type 1 diabetes mellitus 10/22/2020    Family History  Problem Relation Age of Onset   Diabetes Mother    Cancer Mother    Hypertension Father    Heart disease Father    Diabetes Father    Healthy Brother    Healthy Son     Diabetes Son    Healthy Daughter    Past Surgical History:  Procedure Laterality Date   ABDOMINAL EXPOSURE N/A 01/11/2020   Procedure: ABDOMINAL EXPOSURE;  Surgeon: Cephus Shelling, MD;  Location: [facility] OR;  Service: Vascular;  Laterality: N/A;   ANTERIOR LUMBAR FUSION N/A 01/11/2020   Procedure: ANTERIOR LUMBAR INTERBODY FUSION  LUMBAR FIVE-SACRAL ONE, LEFT LUMBAR TWO-THREE FORAMINOTOMY, DISECTOMY;  Surgeon: Venita Lick, MD;  Location: [facility] OR;  Service: Orthopedics;  Laterality: N/A;   APPENDECTOMY  04/28/1970   BACK SURGERY  2016   CARDIAC CATHETERIZATION  12/31/2017   COLONOSCOPY  04/28/2017   CORONARY PRESSURE/FFR STUDY N/A 05/06/2021   Procedure: INTRAVASCULAR  PRESSURE WIRE/FFR STUDY;  Surgeon: Yvonne Kendall, MD;  Location: [facility] INVASIVE CV LAB;  Service: Cardiovascular;  Laterality: N/A;   EXCISION/RELEASE BURSA HIP Left 04/14/2018   Procedure: Excision trochanteric bursa left hip;  Surgeon: Jene Every, MD;  Location: [facility] ORS;  Service: Orthopedics;  Laterality: Left;  60 mins   HAND SURGERY     HIP OPEN REDUCTION Right    ORIF   KNEE ARTHROSCOPY     LEFT HEART CATH AND CORONARY ANGIOGRAPHY N/A 12/31/2017   Procedure: LEFT HEART CATH AND CORONARY ANGIOGRAPHY;  Surgeon: [place], Peter M, MD;  Location: [facility] INVASIVE CV LAB;  Service: Cardiovascular;  Laterality: N/A;   LEFT HEART CATH AND CORONARY ANGIOGRAPHY N/A 05/06/2021   Procedure: LEFT HEART CATH AND CORONARY ANGIOGRAPHY;  Surgeon: Yvonne Kendall, MD;  Location: [facility] INVASIVE CV LAB;  Service: Cardiovascular;  Laterality: N/A;   LUMBAR LAMINECTOMY/DECOMPRESSION MICRODISCECTOMY Left 05/10/2014   Procedure: MICRODISCECTOMY LUMBAR DECOMPRESSION L5-S1 LEFT ;  Surgeon: Javier Docker, MD;  Location: [facility] ORS;  Service: Orthopedics;  Laterality: Left;   LUMBAR LAMINECTOMY/DECOMPRESSION MICRODISCECTOMY N/A 01/11/2020   Procedure: LUMBAR LAMINECTOMY/DECOMPRESSION MICRODISCECTOMY LEFT LUMBAR TWO-THREE;  Surgeon: Venita Lick, MD;   Location: [facility] OR;  Service: Orthopedics;  Laterality: N/A;   LUMBAR LAMINECTOMY/DECOMPRESSION MICRODISCECTOMY Right 09/18/2021   Procedure: Laminectomy and Foraminotomy - Lumbar Four-Lumbar Five - right;  Surgeon: Tia Alert, MD;  Location: [facility] OR;  Service: Neurosurgery;  Laterality: Right;  Laminectomy and Foraminotomy - Lumbar Four-Lumbar Five - right   ORIF WRIST FRACTURE Right    retained hardware   ORTHOPAEDIC SURGERY  04/14/2018   TUBAL LIGATION  12/25/1995   Social History   Social History Narrative   ** Merged History Encounter **       Immunization History  Administered Date(s) Administered   Influenza,inj,Quad PF,6+ Mos 01/21/2016, 12/31/2016, 02/09/2018, 01/18/2021   Influenza,inj,quad, With Preservative 12/27/2016   Influenza-Unspecified 01/21/2016, 12/31/2016   Moderna Sars-Covid-2 Vaccination 04/29/2019, 05/30/2019   Pneumococcal Conjugate-13 07/23/2017   Pneumococcal Polysaccharide-23 02/01/2021   Unspecified SARS-COV-2 Vaccination 02/28/2020     Objective: Vital Signs: BP 116/76 (BP Location: Left Arm, Patient Position: Sitting, Cuff Size: Normal)   Pulse 71   Resp 16   Ht 5\' 2"  (1.575 m)   Wt 149 lb (67.6 kg)   LMP 05/04/2008   BMI 27.25 kg/m    Physical Exam Vitals and nursing note reviewed.  Constitutional:      Appearance: She is well-developed.  HENT:     Head: Normocephalic and atraumatic.  Eyes:     Conjunctiva/sclera: Conjunctivae normal.  Cardiovascular:     Rate and Rhythm: Normal rate and regular rhythm.     Heart sounds: Normal heart sounds.  Pulmonary:     Effort: Pulmonary effort is normal.     Breath sounds: Normal breath sounds.  Abdominal:     General: Bowel sounds are normal.     Palpations: Abdomen is soft.  Musculoskeletal:     Cervical back: Normal range of motion.  Lymphadenopathy:     Cervical: No cervical adenopathy.  Skin:    General: Skin is warm and dry.     Capillary Refill: Capillary refill takes less than 2  seconds.  Neurological:     Mental Status: She is alert and oriented to person, place, and time.  Psychiatric:        Behavior: Behavior normal.      Musculoskeletal Exam: She had no discomfort with range of motion of the cervical, thoracic or  lumbar spine.  She had limited range of motion of the lumbar spine.  Shoulders were in good range of motion with discomfort.  Elbow joints, wrist joints, MCPs PIPs and DIPs Juengel range of motion with no synovitis.  She had bilateral PIP and DIP thickening.  She had tenderness of her right CMC joint.  Hip joints are in good range of motion.  Knee joints were in good range of motion without any warmth swelling or effusion.  She had painful range of motion of her right knee joint.  There was no tenderness over ankles or MTPs.  CDAI Exam: CDAI Score: -- Patient Global: --; Provider Global: -- Swollen: --; Tender: -- Joint Exam 08/12/2022   No joint exam has been documented for this visit   There is currently no information documented on the homunculus. Go to the Rheumatology activity and complete the homunculus joint exam.  Investigation: No additional findings.  Imaging: No results found.  Recent Labs: Lab Results  Component Value Date   WBC 6.9 10/22/2021   HGB 13.5 10/22/2021   PLT 219 09/11/2021   NA 141 10/22/2021   K 4.1 10/22/2021   CL 102 10/22/2021   CO2 25 10/22/2021   GLUCOSE 141 (H) 10/22/2021   BUN 13 10/22/2021   CREATININE 0.64 10/22/2021   BILITOT 0.9 10/22/2021   ALKPHOS 109 10/22/2021   AST 22 10/22/2021   ALT 23 10/22/2021   PROT 5.9 (L) 10/22/2021   ALBUMIN 4.1 10/22/2021   CALCIUM 9.4 10/22/2021   GFRAA >60 01/21/2020    IMPRESSION:  1. Mild tricompartment degenerative change. No acute bony  abnormality. Tiny knee joint effusion cannot be excluded.   2. Questionable cystic lucency noted about the medial right femoral  condyle. MRI of the right knee suggested for further evaluation.   3. Mild  infrapatellar soft tissue swelling cannot be excluded.  Patellar tendinitis could present in this fashion.    Electronically Signed    By: Maisie Fus  Register M.D.    On: 11/26/2021 09:31    MRI of the knee joint from November 28, 2021 showed mild medial tibiofemoral and patellofemoral osteoarthritis with generalized articular cartilage thinning and minimal osteophytes.  Large knee joint effusion was noted.  Degenerative changes of the patellar horn of the medial meniscus without discrete tear.  Cruciate and collateral ligaments were intact.  Quadriceps tendon with patellar tendons were intact and no evidence of fracture or osteonecrosis by Dr.Ahmed   Speciality Comments: Avoid oral bisphosphonates-history of GER Forteo started September 26, 2021  Procedures:  Large Joint Inj: R knee on 08/12/2022 8:35 AM Indications: pain Details: 27 G 1.5 in needle, medial approach  Arthrogram: No  Medications: 40 mg triamcinolone acetonide 40 MG/ML; 1.5 mL lidocaine 1 % Aspirate: 0 mL Outcome: tolerated well, no immediate complications Procedure, treatment alternatives, risks and benefits explained, specific risks discussed. Consent was given by the patient. Immediately prior to procedure a time out was called to verify the correct patient, procedure, equipment, support staff and site/side marked as required. Patient was prepped and draped in the usual sterile fashion.     Allergies: Celebrex [celecoxib] and Mobic [meloxicam]   Assessment / Plan:     Visit Diagnoses: Primary osteoarthritis of right knee - evaluated in the emergency department on 04/24/2022 at which time she had a CT to rule out a blood clot as well as updated x-rays.  I reviewed x-rays of her right knee joint from November 26, 2021 which showed mild osteoarthritic changes  and mild chondromalacia patella.  She also had MRI in August 2023 which she showed me on her cell phone and the report is as described above.  She had large knee joint effusion  at the time.  She had cortisone injection to her right knee joint in November 2023 and had a good response to it.  Patient states she is interested in viscosupplement injections with the cortisone injections does not last very long.  She would like to have a cortisone injection today as she has been having increased pain and discomfort for the last 3 weeks.  She is having difficulty climbing stairs.  No warmth swelling or effusion was noted.  Results of x-rays and MRIs were discussed with the patient.  Chronic pain of right knee-after informed consent was obtained right knee joint was prepped in sterile fashion and injected with lidocaine and Kenalog as described above.  Patient tolerated the procedure well.  Postprocedure instructions were given.  Will apply for viscosupplement injections. This patient is diagnosed with osteoarthritis of the knee(s).    Radiographs show evidence of joint space narrowing, osteophytes, subchondral sclerosis and/or subchondral cysts.  This patient has knee pain which interferes with functional and activities of daily living.    This patient has experienced inadequate response, adverse effects and/or intolerance with conservative treatments such as acetaminophen, NSAIDS, topical creams, physical therapy or regular exercise, knee bracing and/or weight loss.   This patient has experienced inadequate response or has a contraindication to intra articular steroid injections for at least 3 months.   This patient is not scheduled to have a total knee replacement within 6 months of starting treatment with viscosupplementation.   Primary osteoarthritis of left knee-patient states she has osteoarthritis in her left knee joint.  I do not have any x-rays to review.  Chronic pain of both shoulders-she been experiencing pain and discomfort in her bilateral shoulders.  She states she had MRI of her shoulder joints which showed rotator cuff tear.  She has been followed by  orthopedics.  Osteoarthritis of both hands-she complains of pain and discomfort in the bilateral hands.  Bilateral CMC PIP and DIP thickening was noted.  Joint protection muscle strengthening was discussed.  Trigger thumb of right hand - [facility]  Trigger thumb, left thumb - [facility]  DDD (degenerative disc disease), lumbar - Status post discectomy L2-L3 and L4-L5.  L4-L5 laminectomy and microdiscectomy on Sep 18, 2021 by Dr. Yetta Barre.  She continues to have lower back pain.  Age-related osteoporosis without current pathological fracture - DXA 03/20/21: BMD LFN 0.672 with T-score -2.6. Patient was started on Forteo daily injections on 09/30/21.  She is tolerating Forteo daily injections.  We discussed that repeat DEXA scan can be obtained after she finishes the course of Forteo.  I also discussed that we will have to start her on IV Reclast after Forteo to sustain the effect of Forteo.  She cannot take oral bisphosphonates due to reflux.- Plan: VITAMIN D 25 Hydroxy (Vit-D Deficiency, Fractures)  Fusion of lumbar spine - Status post L5-S1 fusion September 2021 by Dr. Shon Baton.  Sep 18, 2021 L4-L5 core decompression by Dr. Marikay Alar.  Medication monitoring encounter -will check labs today.  Plan: CBC with Differential/Platelet, COMPLETE METABOLIC PANEL WITH GFR  Vitamin D deficiency -she has history of vitamin D deficiency.  Will check vitamin D level today.  Plan: VITAMIN D 25 Hydroxy (Vit-D Deficiency, Fractures)  Other medical problems are listed as follows:  Coronary artery disease of native artery  of native heart with stable angina pectoris  Atherosclerosis  Pure hypercholesterolemia  Diabetes mellitus type 1, uncomplicated  Moderate chronic obstructive pulmonary disease  History of gastroesophageal reflux (GERD)  History of sepsis  Trichotillomania  Nicotine dependence, cigarettes, uncomplicated  History of anxiety  Multiple actinic keratoses  Orders: Orders Placed  This Encounter  Procedures   Large Joint Inj: R knee   CBC with Differential/Platelet   COMPLETE METABOLIC PANEL WITH GFR   VITAMIN D 25 Hydroxy (Vit-D Deficiency, Fractures)   No orders of the defined types were placed in this encounter.    Follow-Up Instructions: Return in about 6 months (around 02/11/2023) for Osteoarthritis.   Pollyann Savoy, MD  Note - This record has been created using Animal nutritionist.  Chart creation errors have been sought, but may not always  have been located. Such creation errors do not reflect on  the standard of medical care.

## 2022-08-12 ENCOUNTER — Encounter: Payer: Self-pay | Admitting: Rheumatology

## 2022-08-12 ENCOUNTER — Ambulatory Visit: Payer: BC Managed Care – PPO | Attending: Rheumatology | Admitting: Rheumatology

## 2022-08-12 ENCOUNTER — Telehealth: Payer: Self-pay

## 2022-08-12 VITALS — BP 116/76 | HR 71 | Resp 16 | Ht 62.0 in | Wt 149.0 lb

## 2022-08-12 DIAGNOSIS — M19042 Primary osteoarthritis, left hand: Secondary | ICD-10-CM

## 2022-08-12 DIAGNOSIS — M25511 Pain in right shoulder: Secondary | ICD-10-CM | POA: Diagnosis not present

## 2022-08-12 DIAGNOSIS — M65312 Trigger thumb, left thumb: Secondary | ICD-10-CM

## 2022-08-12 DIAGNOSIS — Z5181 Encounter for therapeutic drug level monitoring: Secondary | ICD-10-CM

## 2022-08-12 DIAGNOSIS — I25118 Atherosclerotic heart disease of native coronary artery with other forms of angina pectoris: Secondary | ICD-10-CM

## 2022-08-12 DIAGNOSIS — M25561 Pain in right knee: Secondary | ICD-10-CM

## 2022-08-12 DIAGNOSIS — I7090 Unspecified atherosclerosis: Secondary | ICD-10-CM

## 2022-08-12 DIAGNOSIS — M810 Age-related osteoporosis without current pathological fracture: Secondary | ICD-10-CM

## 2022-08-12 DIAGNOSIS — Z8719 Personal history of other diseases of the digestive system: Secondary | ICD-10-CM

## 2022-08-12 DIAGNOSIS — Z8619 Personal history of other infectious and parasitic diseases: Secondary | ICD-10-CM

## 2022-08-12 DIAGNOSIS — M65311 Trigger thumb, right thumb: Secondary | ICD-10-CM | POA: Diagnosis not present

## 2022-08-12 DIAGNOSIS — E7800 Pure hypercholesterolemia, unspecified: Secondary | ICD-10-CM

## 2022-08-12 DIAGNOSIS — G8929 Other chronic pain: Secondary | ICD-10-CM

## 2022-08-12 DIAGNOSIS — M1712 Unilateral primary osteoarthritis, left knee: Secondary | ICD-10-CM

## 2022-08-12 DIAGNOSIS — M5136 Other intervertebral disc degeneration, lumbar region: Secondary | ICD-10-CM

## 2022-08-12 DIAGNOSIS — E109 Type 1 diabetes mellitus without complications: Secondary | ICD-10-CM

## 2022-08-12 DIAGNOSIS — F17210 Nicotine dependence, cigarettes, uncomplicated: Secondary | ICD-10-CM

## 2022-08-12 DIAGNOSIS — M25512 Pain in left shoulder: Secondary | ICD-10-CM

## 2022-08-12 DIAGNOSIS — M1711 Unilateral primary osteoarthritis, right knee: Secondary | ICD-10-CM

## 2022-08-12 DIAGNOSIS — L570 Actinic keratosis: Secondary | ICD-10-CM

## 2022-08-12 DIAGNOSIS — M4326 Fusion of spine, lumbar region: Secondary | ICD-10-CM

## 2022-08-12 DIAGNOSIS — J449 Chronic obstructive pulmonary disease, unspecified: Secondary | ICD-10-CM

## 2022-08-12 DIAGNOSIS — F633 Trichotillomania: Secondary | ICD-10-CM

## 2022-08-12 DIAGNOSIS — Z8659 Personal history of other mental and behavioral disorders: Secondary | ICD-10-CM

## 2022-08-12 DIAGNOSIS — M51369 Other intervertebral disc degeneration, lumbar region without mention of lumbar back pain or lower extremity pain: Secondary | ICD-10-CM

## 2022-08-12 DIAGNOSIS — M19041 Primary osteoarthritis, right hand: Secondary | ICD-10-CM

## 2022-08-12 DIAGNOSIS — M25461 Effusion, right knee: Secondary | ICD-10-CM

## 2022-08-12 DIAGNOSIS — E559 Vitamin D deficiency, unspecified: Secondary | ICD-10-CM

## 2022-08-12 MED ORDER — LIDOCAINE HCL 1 % IJ SOLN
1.5000 mL | INTRAMUSCULAR | Status: AC | PRN
  Administered 2022-08-12: 1.5 mL

## 2022-08-12 MED ORDER — TRIAMCINOLONE ACETONIDE 40 MG/ML IJ SUSP
40.0000 mg | INTRAMUSCULAR | Status: AC | PRN
  Administered 2022-08-12: 40 mg via INTRA_ARTICULAR

## 2022-08-12 NOTE — Telephone Encounter (Signed)
VOB submitted for Orthovisc, Right knee(s) BV pending 

## 2022-08-12 NOTE — Telephone Encounter (Signed)
Please apply for right knee visco, per Dr. Deveshwar. Thanks! 

## 2022-08-13 ENCOUNTER — Other Ambulatory Visit: Payer: Self-pay | Admitting: *Deleted

## 2022-08-13 DIAGNOSIS — G8929 Other chronic pain: Secondary | ICD-10-CM

## 2022-08-13 LAB — CBC WITH DIFFERENTIAL/PLATELET
Absolute Monocytes: 428 cells/uL (ref 200–950)
Basophils Absolute: 50 cells/uL (ref 0–200)
Basophils Relative: 0.8 %
Eosinophils Absolute: 391 cells/uL (ref 15–500)
Eosinophils Relative: 6.2 %
HCT: 41.6 % (ref 35.0–45.0)
Hemoglobin: 13.8 g/dL (ref 11.7–15.5)
Lymphs Abs: 2035 cells/uL (ref 850–3900)
MCH: 32.4 pg (ref 27.0–33.0)
MCHC: 33.2 g/dL (ref 32.0–36.0)
MCV: 97.7 fL (ref 80.0–100.0)
MPV: 11.1 fL (ref 7.5–12.5)
Monocytes Relative: 6.8 %
Neutro Abs: 3396 cells/uL (ref 1500–7800)
Neutrophils Relative %: 53.9 %
Platelets: 223 10*3/uL (ref 140–400)
RBC: 4.26 10*6/uL (ref 3.80–5.10)
RDW: 12.7 % (ref 11.0–15.0)
Total Lymphocyte: 32.3 %
WBC: 6.3 10*3/uL (ref 3.8–10.8)

## 2022-08-13 LAB — COMPLETE METABOLIC PANEL WITH GFR
AG Ratio: 2 (calc) (ref 1.0–2.5)
ALT: 27 U/L (ref 6–29)
AST: 29 U/L (ref 10–35)
Albumin: 4.1 g/dL (ref 3.6–5.1)
Alkaline phosphatase (APISO): 93 U/L (ref 37–153)
BUN: 17 mg/dL (ref 7–25)
CO2: 30 mmol/L (ref 20–32)
Calcium: 10.1 mg/dL (ref 8.6–10.4)
Chloride: 99 mmol/L (ref 98–110)
Creat: 0.77 mg/dL (ref 0.50–1.05)
Globulin: 2.1 g/dL (calc) (ref 1.9–3.7)
Glucose, Bld: 101 mg/dL — ABNORMAL HIGH (ref 65–99)
Potassium: 3.9 mmol/L (ref 3.5–5.3)
Sodium: 140 mmol/L (ref 135–146)
Total Bilirubin: 0.9 mg/dL (ref 0.2–1.2)
Total Protein: 6.2 g/dL (ref 6.1–8.1)
eGFR: 87 mL/min/{1.73_m2} (ref >=60)

## 2022-08-13 LAB — VITAMIN D 25 HYDROXY (VIT D DEFICIENCY, FRACTURES): Vit D, 25-Hydroxy: 60 ng/mL (ref 30–100)

## 2022-08-13 MED ORDER — EUFLEXXA 20 MG/2ML IX SOSY: 2.0000 mL | PREFILLED_SYRINGE | INTRA_ARTICULAR | 1 refills | Status: DC

## 2022-08-13 NOTE — Telephone Encounter (Signed)
PA submitted to [facility] for Orthovisc. Per plan Orthovisc is no longer preferred.   PA was also submitted for Euflexxa which is a preferred medication.

## 2022-08-13 NOTE — Telephone Encounter (Signed)
Contacted and advised patient that her insurance approved the Visco knee injections. Advised patient that RX was sent in and to call [facility] to set up shipment to our office. Patient states she does not need the injections at this time and will [facility] when she is ready.

## 2022-08-13 NOTE — Progress Notes (Signed)
CBC and CMP are normal.  Vitamin D is 60 which is normal and in the desirable range.

## 2022-08-13 NOTE — Telephone Encounter (Signed)
Please call to schedule visco injections.  Approved for Euflexxa, Right knee(s). Patient purchase Once OOP has been met patient is covered at 100% (Met: $1442/$5900) $94 co-pay Deductible does not apply Auth# [id number] Valid: 08/13/22-08/13/23  The prescription drug plan requires that this medication be filled through [facility].

## 2022-08-28 ENCOUNTER — Telehealth: Payer: Self-pay | Admitting: Pharmacist

## 2022-08-28 NOTE — Telephone Encounter (Signed)
Received notification from [facility] regarding a prior authorization for FORTEO. Authorization has been APPROVED from 08/28/22 to 08/28/23. Approval letter sent to scan center.  Patient must continue to fill through [facility]: [phone]  Authorization # [id number]  Chesley Mires, PharmD, MPH, BCPS, CPP Clinical Pharmacist (Rheumatology and Pulmonology)

## 2022-09-01 ENCOUNTER — Other Ambulatory Visit: Payer: Self-pay | Admitting: Rheumatology

## 2022-09-01 ENCOUNTER — Telehealth: Payer: Self-pay

## 2022-09-01 DIAGNOSIS — G8929 Other chronic pain: Secondary | ICD-10-CM

## 2022-09-01 NOTE — Telephone Encounter (Signed)
Patient returned the call and was advised  It is too soon for a repeat cortisone injection. Patient declined scheduling visco supplementation on 08/13/22.  Ok to schedule visco if we have the injections and insurance approval remains valid.    Adrienne---Patient will [facility] to fill rx for euflexxa that was sent in. I advised patient that when we received the medication in the office, we will get the injections scheduled. Patient verbalized understanding.    Taylor---Patient states she would like you to know about the broken ribs and she is experiencing muscle and joint pain all over.

## 2022-09-01 NOTE — Telephone Encounter (Signed)
Patient contacted the office and states she recently got a shot in her right knee. Patient states the shot has not worked and it continues to get worse. Patient states the other day her knee went out and she fell and broke two ribs. Patient states she is hurting everywhere. Patient inquires if she needs another cortisone shot or if she should start the gel shots. Patient call back number is [phone]. Please advise.

## 2022-09-01 NOTE — Telephone Encounter (Signed)
Attempted to contact patient and left message to advise patient to call the office.  

## 2022-09-01 NOTE — Telephone Encounter (Signed)
It is too soon for a repeat cortisone injection. Patient declined scheduling visco supplementation on 08/13/22.  Ok to schedule visco if we have the injections and insurance approval remains valid.

## 2022-09-02 ENCOUNTER — Ambulatory Visit: Payer: BC Managed Care – PPO | Admitting: Rheumatology

## 2022-09-02 NOTE — Telephone Encounter (Signed)
I would recommend evaluation by orthopedics/sports medicine for acute injuries.

## 2022-09-02 NOTE — Telephone Encounter (Signed)
I called patient, patient verbalized understanding. 

## 2022-09-04 ENCOUNTER — Ambulatory Visit: Payer: BC Managed Care – PPO | Admitting: Rheumatology

## 2022-09-25 NOTE — Progress Notes (Deleted)
Office Visit Note  Patient: [name]             Date of Birth: [birth year]           MRN: [id number]             PCP: Gordan Payment., MD Referring: Gordan Payment., MD Visit Date: 09/29/2022 Occupation: @GUAROCC @  Subjective:    History of Present Illness: [name] is a 62 y.o. female with history of osteoarthritis, osteoporosis, and DDD.   She remains on forteo daily injections for management of osteoporosis.  Vitamin D 60 on 08/12/22.   Activities of Daily Living:  Patient reports morning stiffness for *** {minute/hour:19697}.   Patient {ACTIONS;DENIES/REPORTS:21021675::"Denies"} nocturnal pain.  Difficulty dressing/grooming: {ACTIONS;DENIES/REPORTS:21021675::"Denies"} Difficulty climbing stairs: {ACTIONS;DENIES/REPORTS:21021675::"Denies"} Difficulty getting out of chair: {ACTIONS;DENIES/REPORTS:21021675::"Denies"} Difficulty using hands for taps, buttons, cutlery, and/or writing: {ACTIONS;DENIES/REPORTS:21021675::"Denies"}  No Rheumatology ROS completed.   PMFS History:  Patient Active Problem List   Diagnosis Date Noted   DOE (dyspnea on exertion) 05/16/2022   Influenzal pneumonia 05/15/2022   Osteoarthritis of right knee 12/07/2021   Dermatographic urticaria 10/18/2021   S/P lumbar laminectomy 09/18/2021   Elevated parathyroid hormone 06/06/2021   Osteoporosis of multiple sites without pathological fracture 06/06/2021   Accelerating angina (HCC) 05/06/2021   Complex regional pain syndrome of lower limb 03/07/2021   Pelvic pain 12/14/2020   RUQ pain 11/09/2020   Elevated alkaline phosphatase level 11/09/2020   Uncomplicated type 1 diabetes mellitus (HCC) 10/22/2020   Lipoma of left upper extremity 09/04/2020   Irritable bowel syndrome 09/03/2020   Idiopathic peripheral neuropathy 08/29/2020   Lipoma of abdominal wall 08/08/2020   Fever 07/18/2020   Hypotension 07/18/2020   Sepsis (HCC) 07/18/2020   Pneumonia 07/18/2020   Arthritis 07/18/2020    Blurred vision 07/11/2020   Facial tingling 07/11/2020   Diabetes mellitus without complication (HCC)    Acute left ankle pain 03/09/2020   History of pneumonia 01/25/2020   History of sepsis 01/25/2020   Respiratory failure (HCC) 01/13/2020   HAP (hospital-acquired pneumonia) 01/13/2020   Fusion of lumbar spine 01/11/2020   Trigger thumb of right hand 11/14/2019   Low back pain 10/22/2019   Obsessive-compulsive disorder 10/14/2019   Diabetes mellitus due to underlying condition with unspecified complications (HCC) 09/15/2019   Primary osteoarthritis involving multiple joints 08/24/2019   Alcohol use 04/15/2019   Elevated LFTs 03/10/2019   Lumbar post-laminectomy syndrome 03/04/2019   Acute bilateral thoracic back pain 12/03/2018   Pain of left hip joint 07/23/2018   Polyarthralgia 06/11/2018   Bursitis of both hips 04/14/2018   Left foot pain 01/28/2018   CAD (coronary artery disease) 12/24/2017   Secondary diabetes mellitus (HCC) 12/24/2017   Coronary arteriosclerosis 12/24/2017   Dyspnea on exertion 12/04/2017   Attention deficit 12/04/2017   Intermittent claudication (HCC) 12/04/2017   History of metabolic disorder 12/04/2017   Lung mass 12/04/2017   Myositis 12/04/2017   Erythrocytosis 12/04/2017   Senile purpura (HCC) 12/04/2017   Chest pain 11/06/2017   Multiple actinic keratoses 11/06/2017   Degeneration of lumbar intervertebral disc 11/06/2017   Fatigue 11/06/2017   Insulin long-term use (HCC) 11/06/2017   Menopausal disorder 11/06/2017   Purpura (HCC) 11/06/2017   Moderate chronic obstructive pulmonary disease (HCC) 11/06/2017   Folic acid deficiency 11/06/2017   Malaise and fatigue 11/06/2017   Arteriosclerotic vascular disease 11/06/2017   Dysphagia 11/04/2017   Sciatica 11/03/2017   Scoliosis deformity of spine 11/03/2017   Lumbar radiculopathy 05/26/2017  Personal history of (healed) traumatic fracture 12/31/2016   Deficiency of other specified B group  vitamins 11/07/2016   Mixed hyperlipidemia 04/09/2016   Smoking greater than 30 pack years 02/26/2016   Cigarette smoker 02/26/2016   Nicotine dependence, cigarettes, uncomplicated 02/26/2016   Diabetes mellitus type 1, uncomplicated (HCC) 02/11/2016   Tobacco abuse 02/11/2016   Tobacco user 02/11/2016   Pure hypercholesterolemia 02/11/2016   Atherosclerosis of native coronary artery of native heart without angina pectoris 02/11/2016   Coronary artery disease of native artery of native heart with stable angina pectoris (HCC) 01/25/2016   Abnormal thyroid function test 12/06/2015   Chronic bronchitis (HCC) 12/04/2015   Overweight with body mass index (BMI) 25.0-29.9 07/11/2015   BMI 27.0-27.9,adult 07/11/2015   Pain in both lower extremities 07/10/2015   Anxiety disorder 06/05/2015   Atherosclerosis 06/05/2015   Hemangioma 06/05/2015   Primary insomnia 06/05/2015   Trichotillomania 06/05/2015   HNP (herniated nucleus pulposus), lumbar 05/10/2014    Past Medical History:  Diagnosis Date   Abnormal thyroid function test 12/06/2015   Accelerating angina (HCC) 05/06/2021   Acute bilateral thoracic back pain 12/03/2018   Acute left ankle pain 03/09/2020   Alcohol use 04/15/2019   Formatting of this note might be different from the original. Recommend abstinence from alcohol   Anxiety disorder 06/05/2015   Last Assessment & Plan:  Formatting of this note might be different from the original. Had started her on some xanax about week ago and she feels it has helped her and she is taking about half tablet for this and will continue with it as she has been compliant with it   Arteriosclerotic vascular disease 11/06/2017   Arthritis    back    Atherosclerosis 06/05/2015   Formatting of this note might be different from the original. Seen on CTs of chest.   Atherosclerosis of native coronary artery of native heart without angina pectoris 02/11/2016   Attention deficit 12/04/2017   Blurred  vision 07/11/2020   BMI 27.0-27.9,adult 07/11/2015   Last Assessment & Plan:  Formatting of this note might be different from the original. Relevant Hx: Course: Daily Update: Today's Plan:difficult for her with the insulin as well as her abilify and the weight is increaseing for her rather than decreasing, she has cut her alcohol intake back, she has worked on her diet more with decreasing her carb intake as well and still difficulty discussed with    Bursitis of both hips 04/14/2018   CAD (coronary artery disease) 12/24/2017   Chest pain 11/06/2017   Chronic bronchitis (HCC) 12/04/2015   Formatting of this note might be different from the original. PFT ratio 88% FEV1 103% FVC 94% DLCO 76%  Last Assessment & Plan:  Formatting of this note might be different from the original. We discussed this as she has been coughing again for about 2 mnths and she sounds congested on exam with previous ease for pneumonia will place her on levaquin and have reviewed with her the importance of her    Cigarette smoker 02/26/2016   Complex regional pain syndrome of lower limb 03/07/2021   Coronary arteriosclerosis 12/24/2017   Coronary artery disease of native artery of native heart with stable angina pectoris (HCC) 01/25/2016   Formatting of this note might be different from the original. 2017:  50% LAD lesion.  Calcium noted other places.  Heart cath December 31, 2017 with a 30% ostial LAD lesion otherwise normal coronary arteries seen with LVEF 55 to 65%  Deficiency of other specified B group vitamins 11/07/2016   Degeneration of lumbar intervertebral disc 11/06/2017   Diabetes mellitus due to underlying condition with unspecified complications (HCC) 09/15/2019   Diabetes mellitus type 1, uncomplicated (HCC) 02/11/2016   Added automatically from request for surgery [id number]  Formatting of this note might be different from the original. Added automatically from request for surgery [id number]   Diabetes mellitus without  complication (HCC)    Type 1- injections only- recent change to Toujeo with improved A1C reading.   Dysphagia 11/04/2017   Dyspnea on exertion 12/04/2017   Elevated alkaline phosphatase level 11/09/2020   Elevated LFTs 03/10/2019   Formatting of this note might be different from the original. History.  Cannot change to this.   Elevated parathyroid hormone 06/06/2021   Erythrocytosis 12/04/2017   Facial tingling 07/11/2020   Fatigue 11/06/2017   Fever 07/18/2020   Folic acid deficiency 11/06/2017   Fusion of lumbar spine 01/11/2020   HAP (hospital-acquired pneumonia) 01/13/2020   Hemangioma 11/06/2017   History of metabolic disorder 12/04/2017   History of pneumonia 01/25/2020   Formatting of this note might be different from the original. Recent mutifocal pneumonia September 2021 Formatting of this note might be different from the original. Formatting of this note might be different from the original. Recent mutifocal pneumonia September 2021   History of sepsis 01/25/2020   Formatting of this note might be different from the original. Recent septic syndrome with multifocal pneumonia Formatting of this note might be different from the original. Formatting of this note might be different from the original. Recent septic syndrome with multifocal pneumonia   HNP (herniated nucleus pulposus), lumbar 05/10/2014   Hypotension 07/18/2020   Idiopathic peripheral neuropathy 08/29/2020   Insulin long-term use (HCC) 11/06/2017   Intermittent claudication (HCC) 12/04/2017   Irritable bowel syndrome 09/03/2020   Left foot pain 01/28/2018   Lipoma of abdominal wall 08/08/2020   Lipoma of left upper extremity 09/04/2020   Low back pain 10/22/2019   Lumbar post-laminectomy syndrome 03/04/2019   Lumbar radiculopathy 05/26/2017   Lung mass 12/04/2017   Malaise and fatigue 11/06/2017   Menopausal disorder 11/06/2017   Mixed hyperlipidemia 04/09/2016   Formatting of this note might be different from  the original. Added automatically from request for surgery [id number]   Moderate chronic obstructive pulmonary disease (HCC) 11/06/2017   Multiple actinic keratoses 11/06/2017   Myositis 12/04/2017   Nicotine dependence, cigarettes, uncomplicated 02/26/2016   Obsessive-compulsive disorder 10/14/2019   Formatting of this note might be different from the original. Formerly followed with [facility].   Osteoporosis of multiple sites without pathological fracture 06/06/2021   Overweight with body mass index (BMI) 25.0-29.9 07/11/2015   Last Assessment & Plan:  Relevant Hx: Course: Daily Update: Today's Plan:difficult for her with the insulin as well as her abilify and the weight is increaseing for her rather than decreasing, she has cut her alcohol intake back, she has worked on her diet more with decreasing her carb intake as well and still difficulty discussed with the saxenda and will try to get this approved for her and see    Pain in both lower extremities 07/10/2015   Last Assessment & Plan:  Formatting of this note might be different from the original. Relevant Hx: Course: Daily Update: Today's Plan:she had pain to her right anterior thigh there is edema of both legs and she has fear for DVT which she is going to have [place] of and confirm ok, she has  increased her weight which is part of her edema I believe as well as her insulin usage. She is advised as well tha   Pain of left hip joint 07/23/2018   Pelvic pain 12/14/2020   Personal history of (healed) traumatic fracture 12/31/2016   Pneumonia    Polyarthralgia 06/11/2018   Primary insomnia 06/05/2015   Primary osteoarthritis involving multiple joints 08/24/2019   Pure hypercholesterolemia 02/11/2016   Purpura (HCC) 11/06/2017   Respiratory failure (HCC) 11/2018   RUQ pain 11/09/2020   S/P lumbar laminectomy 09/18/2021   Sciatica 11/03/2017   Scoliosis deformity of spine 11/03/2017   Secondary diabetes mellitus (HCC) 12/24/2017   Senile purpura  (HCC) 12/04/2017   Sepsis (HCC) 07/18/2020   Smoking greater than 30 pack years 02/26/2016   Tobacco abuse 02/11/2016   Tobacco user 02/11/2016   Trichotillomania    hx.    Trigger thumb of right hand 11/14/2019   Uncomplicated type 1 diabetes mellitus (HCC) 10/22/2020    Family History  Problem Relation Age of Onset   Diabetes Mother    Cancer Mother    Hypertension Father    Heart disease Father    Diabetes Father    Healthy Brother    Healthy Son    Diabetes Son    Healthy Daughter    Past Surgical History:  Procedure Laterality Date   ABDOMINAL EXPOSURE N/A 01/11/2020   Procedure: ABDOMINAL EXPOSURE;  Surgeon: Cephus Shelling, MD;  Location: [facility] OR;  Service: Vascular;  Laterality: N/A;   ANTERIOR LUMBAR FUSION N/A 01/11/2020   Procedure: ANTERIOR LUMBAR INTERBODY FUSION  LUMBAR FIVE-SACRAL ONE, LEFT LUMBAR TWO-THREE FORAMINOTOMY, DISECTOMY;  Surgeon: Venita Lick, MD;  Location: [facility] OR;  Service: Orthopedics;  Laterality: N/A;   APPENDECTOMY  04/28/1970   BACK SURGERY  2016   CARDIAC CATHETERIZATION  12/31/2017   COLONOSCOPY  04/28/2017   CORONARY PRESSURE/FFR STUDY N/A 05/06/2021   Procedure: INTRAVASCULAR PRESSURE WIRE/FFR STUDY;  Surgeon: Yvonne Kendall, MD;  Location: [facility] INVASIVE CV LAB;  Service: Cardiovascular;  Laterality: N/A;   EXCISION/RELEASE BURSA HIP Left 04/14/2018   Procedure: Excision trochanteric bursa left hip;  Surgeon: Jene Every, MD;  Location: [facility] ORS;  Service: Orthopedics;  Laterality: Left;  60 mins   HAND SURGERY     HIP OPEN REDUCTION Right    ORIF   KNEE ARTHROSCOPY     LEFT HEART CATH AND CORONARY ANGIOGRAPHY N/A 12/31/2017   Procedure: LEFT HEART CATH AND CORONARY ANGIOGRAPHY;  Surgeon: [place], Peter M, MD;  Location: [facility] INVASIVE CV LAB;  Service: Cardiovascular;  Laterality: N/A;   LEFT HEART CATH AND CORONARY ANGIOGRAPHY N/A 05/06/2021   Procedure: LEFT HEART CATH AND CORONARY ANGIOGRAPHY;  Surgeon: Yvonne Kendall, MD;  Location: [facility]  INVASIVE CV LAB;  Service: Cardiovascular;  Laterality: N/A;   LUMBAR LAMINECTOMY/DECOMPRESSION MICRODISCECTOMY Left 05/10/2014   Procedure: MICRODISCECTOMY LUMBAR DECOMPRESSION L5-S1 LEFT ;  Surgeon: Javier Docker, MD;  Location: [facility] ORS;  Service: Orthopedics;  Laterality: Left;   LUMBAR LAMINECTOMY/DECOMPRESSION MICRODISCECTOMY N/A 01/11/2020   Procedure: LUMBAR LAMINECTOMY/DECOMPRESSION MICRODISCECTOMY LEFT LUMBAR TWO-THREE;  Surgeon: Venita Lick, MD;  Location: [facility] OR;  Service: Orthopedics;  Laterality: N/A;   LUMBAR LAMINECTOMY/DECOMPRESSION MICRODISCECTOMY Right 09/18/2021   Procedure: Laminectomy and Foraminotomy - Lumbar Four-Lumbar Five - right;  Surgeon: Tia Alert, MD;  Location: [facility] OR;  Service: Neurosurgery;  Laterality: Right;  Laminectomy and Foraminotomy - Lumbar Four-Lumbar Five - right   ORIF WRIST FRACTURE Right    retained hardware  ORTHOPAEDIC SURGERY  04/14/2018   TUBAL LIGATION  12/25/1995   Social History   Social History Narrative   ** Merged History Encounter **       Immunization History  Administered Date(s) Administered   Influenza,inj,Quad PF,6+ Mos 01/21/2016, 12/31/2016, 02/09/2018, 01/18/2021   Influenza,inj,quad, With Preservative 12/27/2016   Influenza-Unspecified 01/21/2016, 12/31/2016   Moderna Sars-Covid-2 Vaccination 04/29/2019, 05/30/2019   Pneumococcal Conjugate-13 07/23/2017   Pneumococcal Polysaccharide-23 02/01/2021   Unspecified SARS-COV-2 Vaccination 02/28/2020     Objective: Vital Signs: LMP 05/04/2008    Physical Exam Vitals and nursing note reviewed.  Constitutional:      Appearance: She is well-developed.  HENT:     Head: Normocephalic and atraumatic.  Eyes:     Conjunctiva/sclera: Conjunctivae normal.  Cardiovascular:     Rate and Rhythm: Normal rate and regular rhythm.     Heart sounds: Normal heart sounds.  Pulmonary:     Effort: Pulmonary effort is normal.     Breath sounds: Normal breath sounds.  Abdominal:      General: Bowel sounds are normal.     Palpations: Abdomen is soft.  Musculoskeletal:     Cervical back: Normal range of motion.  Lymphadenopathy:     Cervical: No cervical adenopathy.  Skin:    General: Skin is warm and dry.     Capillary Refill: Capillary refill takes less than 2 seconds.  Neurological:     Mental Status: She is alert and oriented to person, place, and time.  Psychiatric:        Behavior: Behavior normal.      Musculoskeletal Exam: ***  CDAI Exam: CDAI Score: -- Patient Global: --; Provider Global: -- Swollen: --; Tender: -- Joint Exam 09/29/2022   No joint exam has been documented for this visit   There is currently no information documented on the homunculus. Go to the Rheumatology activity and complete the homunculus joint exam.  Investigation: No additional findings.  Imaging: No results found.  Recent Labs: Lab Results  Component Value Date   WBC 6.3 08/12/2022   HGB 13.8 08/12/2022   PLT 223 08/12/2022   NA 140 08/12/2022   K 3.9 08/12/2022   CL 99 08/12/2022   CO2 30 08/12/2022   GLUCOSE 101 (H) 08/12/2022   BUN 17 08/12/2022   CREATININE 0.77 08/12/2022   BILITOT 0.9 08/12/2022   ALKPHOS 109 10/22/2021   AST 29 08/12/2022   ALT 27 08/12/2022   PROT 6.2 08/12/2022   ALBUMIN 4.1 10/22/2021   CALCIUM 10.1 08/12/2022   GFRAA >60 01/21/2020    Speciality Comments: Avoid oral bisphosphonates-history of GER Forteo started September 26, 2021  Procedures:  No procedures performed Allergies: Celebrex [celecoxib] and Mobic [meloxicam]   Assessment / Plan:     Visit Diagnoses: Primary osteoarthritis of both hands  Primary osteoarthritis of right knee  Primary osteoarthritis of left knee  Chronic pain of both shoulders  Trigger thumb of right hand  Trigger thumb, left thumb  DDD (degenerative disc disease), lumbar  Age-related osteoporosis without current pathological fracture  Fusion of lumbar spine  Medication monitoring  encounter  Vitamin D deficiency  Coronary artery disease of native artery of native heart with stable angina pectoris (HCC)  Atherosclerosis  Pure hypercholesterolemia  Diabetes mellitus type 1, uncomplicated (HCC)  Moderate chronic obstructive pulmonary disease (HCC)  History of gastroesophageal reflux (GERD)  History of sepsis  Trichotillomania  Nicotine dependence, cigarettes, uncomplicated  History of anxiety  Multiple actinic keratoses  Orders: No  orders of the defined types were placed in this encounter.  No orders of the defined types were placed in this encounter.   Face-to-face time spent with patient was *** minutes. Greater than 50% of time was spent in counseling and coordination of care.  Follow-Up Instructions: No follow-ups on file.   Gearldine Bienenstock, PA-C  Note - This record has been created using Dragon software.  Chart creation errors have been sought, but may not always  have been located. Such creation errors do not reflect on  the standard of medical care.

## 2022-09-29 ENCOUNTER — Ambulatory Visit: Payer: BC Managed Care – PPO | Admitting: Physician Assistant

## 2022-09-29 DIAGNOSIS — M65311 Trigger thumb, right thumb: Secondary | ICD-10-CM

## 2022-09-29 DIAGNOSIS — M5136 Other intervertebral disc degeneration, lumbar region: Secondary | ICD-10-CM

## 2022-09-29 DIAGNOSIS — M810 Age-related osteoporosis without current pathological fracture: Secondary | ICD-10-CM

## 2022-09-29 DIAGNOSIS — M1711 Unilateral primary osteoarthritis, right knee: Secondary | ICD-10-CM

## 2022-09-29 DIAGNOSIS — M19041 Primary osteoarthritis, right hand: Secondary | ICD-10-CM

## 2022-09-29 DIAGNOSIS — M1712 Unilateral primary osteoarthritis, left knee: Secondary | ICD-10-CM

## 2022-09-29 DIAGNOSIS — E109 Type 1 diabetes mellitus without complications: Secondary | ICD-10-CM

## 2022-09-29 DIAGNOSIS — I7090 Unspecified atherosclerosis: Secondary | ICD-10-CM

## 2022-09-29 DIAGNOSIS — I25118 Atherosclerotic heart disease of native coronary artery with other forms of angina pectoris: Secondary | ICD-10-CM

## 2022-09-29 DIAGNOSIS — J449 Chronic obstructive pulmonary disease, unspecified: Secondary | ICD-10-CM

## 2022-09-29 DIAGNOSIS — Z8719 Personal history of other diseases of the digestive system: Secondary | ICD-10-CM

## 2022-09-29 DIAGNOSIS — Z5181 Encounter for therapeutic drug level monitoring: Secondary | ICD-10-CM

## 2022-09-29 DIAGNOSIS — E7800 Pure hypercholesterolemia, unspecified: Secondary | ICD-10-CM

## 2022-09-29 DIAGNOSIS — F17210 Nicotine dependence, cigarettes, uncomplicated: Secondary | ICD-10-CM

## 2022-09-29 DIAGNOSIS — F633 Trichotillomania: Secondary | ICD-10-CM

## 2022-09-29 DIAGNOSIS — Z8619 Personal history of other infectious and parasitic diseases: Secondary | ICD-10-CM

## 2022-09-29 DIAGNOSIS — M65312 Trigger thumb, left thumb: Secondary | ICD-10-CM

## 2022-09-29 DIAGNOSIS — E559 Vitamin D deficiency, unspecified: Secondary | ICD-10-CM

## 2022-09-29 DIAGNOSIS — L570 Actinic keratosis: Secondary | ICD-10-CM

## 2022-09-29 DIAGNOSIS — G8929 Other chronic pain: Secondary | ICD-10-CM

## 2022-09-29 DIAGNOSIS — Z8659 Personal history of other mental and behavioral disorders: Secondary | ICD-10-CM

## 2022-09-29 DIAGNOSIS — M4326 Fusion of spine, lumbar region: Secondary | ICD-10-CM

## 2022-10-01 NOTE — Progress Notes (Deleted)
Office Visit Note  Patient: [name]             Date of Birth: 1960/07/21           MRN: [id number]             PCP: Gordan Payment., MD Referring: Gordan Payment., MD Visit Date: 10/03/2022 Occupation: @GUAROCC @  Subjective:  Right knee pain   History of Present Illness: [name] is a 62 y.o. female with history of osteoarthritis and osteoporosis.     Activities of Daily Living:  Patient reports morning stiffness for *** {minute/hour:19697}.   Patient {ACTIONS;DENIES/REPORTS:21021675::"Denies"} nocturnal pain.  Difficulty dressing/grooming: {ACTIONS;DENIES/REPORTS:21021675::"Denies"} Difficulty climbing stairs: {ACTIONS;DENIES/REPORTS:21021675::"Denies"} Difficulty getting out of chair: {ACTIONS;DENIES/REPORTS:21021675::"Denies"} Difficulty using hands for taps, buttons, cutlery, and/or writing: {ACTIONS;DENIES/REPORTS:21021675::"Denies"}  No Rheumatology ROS completed.   PMFS History:  Patient Active Problem List   Diagnosis Date Noted  . DOE (dyspnea on exertion) 05/16/2022  . Influenzal pneumonia 05/15/2022  . Osteoarthritis of right knee 12/07/2021  . Dermatographic urticaria 10/18/2021  . S/P lumbar laminectomy 09/18/2021  . Elevated parathyroid hormone 06/06/2021  . Osteoporosis of multiple sites without pathological fracture 06/06/2021  . Accelerating angina (HCC) 05/06/2021  . Complex regional pain syndrome of lower limb 03/07/2021  . Pelvic pain 12/14/2020  . RUQ pain 11/09/2020  . Elevated alkaline phosphatase level 11/09/2020  . Uncomplicated type 1 diabetes mellitus (HCC) 10/22/2020  . Lipoma of left upper extremity 09/04/2020  . Irritable bowel syndrome 09/03/2020  . Idiopathic peripheral neuropathy 08/29/2020  . Lipoma of abdominal wall 08/08/2020  . Fever 07/18/2020  . Hypotension 07/18/2020  . Sepsis (HCC) 07/18/2020  . Pneumonia 07/18/2020  . Arthritis 07/18/2020  . Blurred vision 07/11/2020  . Facial tingling 07/11/2020  .  Diabetes mellitus without complication (HCC)   . Acute left ankle pain 03/09/2020  . History of pneumonia 01/25/2020  . History of sepsis 01/25/2020  . Respiratory failure (HCC) 01/13/2020  . HAP (hospital-acquired pneumonia) 01/13/2020  . Fusion of lumbar spine 01/11/2020  . Trigger thumb of right hand 11/14/2019  . Low back pain 10/22/2019  . Obsessive-compulsive disorder 10/14/2019  . Diabetes mellitus due to underlying condition with unspecified complications (HCC) 09/15/2019  . Primary osteoarthritis involving multiple joints 08/24/2019  . Alcohol use 04/15/2019  . Elevated LFTs 03/10/2019  . Lumbar post-laminectomy syndrome 03/04/2019  . Acute bilateral thoracic back pain 12/03/2018  . Pain of left hip joint 07/23/2018  . Polyarthralgia 06/11/2018  . Bursitis of both hips 04/14/2018  . Left foot pain 01/28/2018  . CAD (coronary artery disease) 12/24/2017  . Secondary diabetes mellitus (HCC) 12/24/2017  . Coronary arteriosclerosis 12/24/2017  . Dyspnea on exertion 12/04/2017  . Attention deficit 12/04/2017  . Intermittent claudication (HCC) 12/04/2017  . History of metabolic disorder 12/04/2017  . Lung mass 12/04/2017  . Myositis 12/04/2017  . Erythrocytosis 12/04/2017  . Senile purpura (HCC) 12/04/2017  . Chest pain 11/06/2017  . Multiple actinic keratoses 11/06/2017  . Degeneration of lumbar intervertebral disc 11/06/2017  . Fatigue 11/06/2017  . Insulin long-term use (HCC) 11/06/2017  . Menopausal disorder 11/06/2017  . Purpura (HCC) 11/06/2017  . Moderate chronic obstructive pulmonary disease (HCC) 11/06/2017  . Folic acid deficiency 11/06/2017  . Malaise and fatigue 11/06/2017  . Arteriosclerotic vascular disease 11/06/2017  . Dysphagia 11/04/2017  . Sciatica 11/03/2017  . Scoliosis deformity of spine 11/03/2017  . Lumbar radiculopathy 05/26/2017  . Personal history of (healed) traumatic fracture 12/31/2016  . Deficiency of other  specified B group vitamins  11/07/2016  . Mixed hyperlipidemia 04/09/2016  . Smoking greater than 30 pack years 02/26/2016  . Cigarette smoker 02/26/2016  . Nicotine dependence, cigarettes, uncomplicated 02/26/2016  . Diabetes mellitus type 1, uncomplicated (HCC) 02/11/2016  . Tobacco abuse 02/11/2016  . Tobacco user 02/11/2016  . Pure hypercholesterolemia 02/11/2016  . Atherosclerosis of native coronary artery of native heart without angina pectoris 02/11/2016  . Coronary artery disease of native artery of native heart with stable angina pectoris (HCC) 01/25/2016  . Abnormal thyroid function test 12/06/2015  . Chronic bronchitis (HCC) 12/04/2015  . Overweight with body mass index (BMI) 25.0-29.9 07/11/2015  . BMI 27.0-27.9,adult 07/11/2015  . Pain in both lower extremities 07/10/2015  . Anxiety disorder 06/05/2015  . Atherosclerosis 06/05/2015  . Hemangioma 06/05/2015  . Primary insomnia 06/05/2015  . Trichotillomania 06/05/2015  . HNP (herniated nucleus pulposus), lumbar 05/10/2014    Past Medical History:  Diagnosis Date  . Abnormal thyroid function test 12/06/2015  . Accelerating angina (HCC) 05/06/2021  . Acute bilateral thoracic back pain 12/03/2018  . Acute left ankle pain 03/09/2020  . Alcohol use 04/15/2019   Formatting of this note might be different from the original. Recommend abstinence from alcohol  . Anxiety disorder 06/05/2015   Last Assessment & Plan:  Formatting of this note might be different from the original. Had started her on some xanax about week ago and she feels it has helped her and she is taking about half tablet for this and will continue with it as she has been compliant with it  . Arteriosclerotic vascular disease 11/06/2017  . Arthritis    back   . Atherosclerosis 06/05/2015   Formatting of this note might be different from the original. Seen on CTs of chest.  . Atherosclerosis of native coronary artery of native heart without angina pectoris 02/11/2016  . Attention deficit  12/04/2017  . Blurred vision 07/11/2020  . BMI 27.0-27.9,adult 07/11/2015   Last Assessment & Plan:  Formatting of this note might be different from the original. Relevant Hx: Course: Daily Update: Today's Plan:difficult for her with the insulin as well as her abilify and the weight is increaseing for her rather than decreasing, she has cut her alcohol intake back, she has worked on her diet more with decreasing her carb intake as well and still difficulty discussed with   . Bursitis of both hips 04/14/2018  . CAD (coronary artery disease) 12/24/2017  . Chest pain 11/06/2017  . Chronic bronchitis (HCC) 12/04/2015   Formatting of this note might be different from the original. PFT ratio 88% FEV1 103% FVC 94% DLCO 76%  Last Assessment & Plan:  Formatting of this note might be different from the original. We discussed this as she has been coughing again for about 2 mnths and she sounds congested on exam with previous ease for pneumonia will place her on levaquin and have reviewed with her the importance of her   . Cigarette smoker 02/26/2016  . Complex regional pain syndrome of lower limb 03/07/2021  . Coronary arteriosclerosis 12/24/2017  . Coronary artery disease of native artery of native heart with stable angina pectoris (HCC) 01/25/2016   Formatting of this note might be different from the original. 2017:  50% LAD lesion.  Calcium noted other places.  Heart cath December 31, 2017 with a 30% ostial LAD lesion otherwise normal coronary arteries seen with LVEF 55 to 65%  . Deficiency of other specified B group vitamins 11/07/2016  .  Degeneration of lumbar intervertebral disc 11/06/2017  . Diabetes mellitus due to underlying condition with unspecified complications (HCC) 09/15/2019  . Diabetes mellitus type 1, uncomplicated (HCC) 02/11/2016   Added automatically from request for surgery [id number]  Formatting of this note might be different from the original. Added automatically from request for surgery  [phone]  . Diabetes mellitus without complication (HCC)    Type 1- injections only- recent change to Toujeo with improved A1C reading.  Marland Kitchen Dysphagia 11/04/2017  . Dyspnea on exertion 12/04/2017  . Elevated alkaline phosphatase level 11/09/2020  . Elevated LFTs 03/10/2019   Formatting of this note might be different from the original. History.  Cannot change to this.  . Elevated parathyroid hormone 06/06/2021  . Erythrocytosis 12/04/2017  . Facial tingling 07/11/2020  . Fatigue 11/06/2017  . Fever 07/18/2020  . Folic acid deficiency 11/06/2017  . Fusion of lumbar spine 01/11/2020  . HAP (hospital-acquired pneumonia) 01/13/2020  . Hemangioma 11/06/2017  . History of metabolic disorder 12/04/2017  . History of pneumonia 01/25/2020   Formatting of this note might be different from the original. Recent mutifocal pneumonia September 2021 Formatting of this note might be different from the original. Formatting of this note might be different from the original. Recent mutifocal pneumonia September 2021  . History of sepsis 01/25/2020   Formatting of this note might be different from the original. Recent septic syndrome with multifocal pneumonia Formatting of this note might be different from the original. Formatting of this note might be different from the original. Recent septic syndrome with multifocal pneumonia  . HNP (herniated nucleus pulposus), lumbar 05/10/2014  . Hypotension 07/18/2020  . Idiopathic peripheral neuropathy 08/29/2020  . Insulin long-term use (HCC) 11/06/2017  . Intermittent claudication (HCC) 12/04/2017  . Irritable bowel syndrome 09/03/2020  . Left foot pain 01/28/2018  . Lipoma of abdominal wall 08/08/2020  . Lipoma of left upper extremity 09/04/2020  . Low back pain 10/22/2019  . Lumbar post-laminectomy syndrome 03/04/2019  . Lumbar radiculopathy 05/26/2017  . Lung mass 12/04/2017  . Malaise and fatigue 11/06/2017  . Menopausal disorder 11/06/2017  . Mixed  hyperlipidemia 04/09/2016   Formatting of this note might be different from the original. Added automatically from request for surgery [phone]  . Moderate chronic obstructive pulmonary disease (HCC) 11/06/2017  . Multiple actinic keratoses 11/06/2017  . Myositis 12/04/2017  . Nicotine dependence, cigarettes, uncomplicated 02/26/2016  . Obsessive-compulsive disorder 10/14/2019   Formatting of this note might be different from the original. Formerly followed with [facility].  . Osteoporosis of multiple sites without pathological fracture 06/06/2021  . Overweight with body mass index (BMI) 25.0-29.9 07/11/2015   Last Assessment & Plan:  Relevant Hx: Course: Daily Update: Today's Plan:difficult for her with the insulin as well as her abilify and the weight is increaseing for her rather than decreasing, she has cut her alcohol intake back, she has worked on her diet more with decreasing her carb intake as well and still difficulty discussed with the saxenda and will try to get this approved for her and see   . Pain in both lower extremities 07/10/2015   Last Assessment & Plan:  Formatting of this note might be different from the original. Relevant Hx: Course: Daily Update: Today's Plan:she had pain to her right anterior thigh there is edema of both legs and she has fear for DVT which she is going to have [place] of and confirm ok, she has increased her weight which is part of her edema I  believe as well as her insulin usage. She is advised as well tha  . Pain of left hip joint 07/23/2018  . Pelvic pain 12/14/2020  . Personal history of (healed) traumatic fracture 12/31/2016  . Pneumonia   . Polyarthralgia 06/11/2018  . Primary insomnia 06/05/2015  . Primary osteoarthritis involving multiple joints 08/24/2019  . Pure hypercholesterolemia 02/11/2016  . Purpura (HCC) 11/06/2017  . Respiratory failure (HCC) 11/2018  . RUQ pain 11/09/2020  . S/P lumbar laminectomy 09/18/2021  . Sciatica 11/03/2017  . Scoliosis  deformity of spine 11/03/2017  . Secondary diabetes mellitus (HCC) 12/24/2017  . Senile purpura (HCC) 12/04/2017  . Sepsis (HCC) 07/18/2020  . Smoking greater than 30 pack years 02/26/2016  . Tobacco abuse 02/11/2016  . Tobacco user 02/11/2016  . Trichotillomania    hx.   . Trigger thumb of right hand 11/14/2019  . Uncomplicated type 1 diabetes mellitus (HCC) 10/22/2020    Family History  Problem Relation Age of Onset  . Diabetes Mother   . Cancer Mother   . Hypertension Father   . Heart disease Father   . Diabetes Father   . Healthy Brother   . Healthy Son   . Diabetes Son   . Healthy Daughter    Past Surgical History:  Procedure Laterality Date  . ABDOMINAL EXPOSURE N/A 01/11/2020   Procedure: ABDOMINAL EXPOSURE;  Surgeon: Cephus Shelling, MD;  Location: [facility] OR;  Service: Vascular;  Laterality: N/A;  . ANTERIOR LUMBAR FUSION N/A 01/11/2020   Procedure: ANTERIOR LUMBAR INTERBODY FUSION  LUMBAR FIVE-SACRAL ONE, LEFT LUMBAR TWO-THREE FORAMINOTOMY, DISECTOMY;  Surgeon: Venita Lick, MD;  Location: [facility] OR;  Service: Orthopedics;  Laterality: N/A;  . APPENDECTOMY  04/28/1970  . BACK SURGERY  2016  . CARDIAC CATHETERIZATION  12/31/2017  . COLONOSCOPY  04/28/2017  . CORONARY PRESSURE/FFR STUDY N/A 05/06/2021   Procedure: INTRAVASCULAR PRESSURE WIRE/FFR STUDY;  Surgeon: Yvonne Kendall, MD;  Location: [facility] INVASIVE CV LAB;  Service: Cardiovascular;  Laterality: N/A;  . EXCISION/RELEASE BURSA HIP Left 04/14/2018   Procedure: Excision trochanteric bursa left hip;  Surgeon: Jene Every, MD;  Location: [facility] ORS;  Service: Orthopedics;  Laterality: Left;  60 mins  . HAND SURGERY    . HIP OPEN REDUCTION Right    ORIF  . KNEE ARTHROSCOPY    . LEFT HEART CATH AND CORONARY ANGIOGRAPHY N/A 12/31/2017   Procedure: LEFT HEART CATH AND CORONARY ANGIOGRAPHY;  Surgeon: [place], Peter M, MD;  Location: [facility] INVASIVE CV LAB;  Service: Cardiovascular;  Laterality: N/A;  . LEFT HEART CATH AND CORONARY  ANGIOGRAPHY N/A 05/06/2021   Procedure: LEFT HEART CATH AND CORONARY ANGIOGRAPHY;  Surgeon: Yvonne Kendall, MD;  Location: [facility] INVASIVE CV LAB;  Service: Cardiovascular;  Laterality: N/A;  . LUMBAR LAMINECTOMY/DECOMPRESSION MICRODISCECTOMY Left 05/10/2014   Procedure: MICRODISCECTOMY LUMBAR DECOMPRESSION L5-S1 LEFT ;  Surgeon: Javier Docker, MD;  Location: [facility] ORS;  Service: Orthopedics;  Laterality: Left;  . LUMBAR LAMINECTOMY/DECOMPRESSION MICRODISCECTOMY N/A 01/11/2020   Procedure: LUMBAR LAMINECTOMY/DECOMPRESSION MICRODISCECTOMY LEFT LUMBAR TWO-THREE;  Surgeon: Venita Lick, MD;  Location: [facility] OR;  Service: Orthopedics;  Laterality: N/A;  . LUMBAR LAMINECTOMY/DECOMPRESSION MICRODISCECTOMY Right 09/18/2021   Procedure: Laminectomy and Foraminotomy - Lumbar Four-Lumbar Five - right;  Surgeon: Tia Alert, MD;  Location: [facility] OR;  Service: Neurosurgery;  Laterality: Right;  Laminectomy and Foraminotomy - Lumbar Four-Lumbar Five - right  . ORIF WRIST FRACTURE Right    retained hardware  . ORTHOPAEDIC SURGERY  04/14/2018  . TUBAL LIGATION  12/25/1995   Social History   Social History Narrative   ** Merged History Encounter **       Immunization History  Administered Date(s) Administered  . Influenza,inj,Quad PF,6+ Mos 01/21/2016, 12/31/2016, 02/09/2018, 01/18/2021  . Influenza,inj,quad, With Preservative 12/27/2016  . Influenza-Unspecified 01/21/2016, 12/31/2016  . Moderna Sars-Covid-2 Vaccination 04/29/2019, 05/30/2019  . Pneumococcal Conjugate-13 07/23/2017  . Pneumococcal Polysaccharide-23 02/01/2021  . Unspecified SARS-COV-2 Vaccination 02/28/2020     Objective: Vital Signs: LMP 05/04/2008    Physical Exam Vitals and nursing note reviewed.  Constitutional:      Appearance: She is well-developed.  HENT:     Head: Normocephalic and atraumatic.  Eyes:     Conjunctiva/sclera: Conjunctivae normal.  Cardiovascular:     Rate and Rhythm: Normal rate and regular rhythm.     Heart  sounds: Normal heart sounds.  Pulmonary:     Effort: Pulmonary effort is normal.     Breath sounds: Normal breath sounds.  Abdominal:     General: Bowel sounds are normal.     Palpations: Abdomen is soft.  Musculoskeletal:     Cervical back: Normal range of motion.  Lymphadenopathy:     Cervical: No cervical adenopathy.  Skin:    General: Skin is warm and dry.     Capillary Refill: Capillary refill takes less than 2 seconds.  Neurological:     Mental Status: She is alert and oriented to person, place, and time.  Psychiatric:        Behavior: Behavior normal.     Musculoskeletal Exam: ***  CDAI Exam: CDAI Score: -- Patient Global: --; Provider Global: -- Swollen: --; Tender: -- Joint Exam 10/03/2022   No joint exam has been documented for this visit   There is currently no information documented on the homunculus. Go to the Rheumatology activity and complete the homunculus joint exam.  Investigation: No additional findings.  Imaging: No results found.  Recent Labs: Lab Results  Component Value Date   WBC 6.3 08/12/2022   HGB 13.8 08/12/2022   PLT 223 08/12/2022   NA 140 08/12/2022   K 3.9 08/12/2022   CL 99 08/12/2022   CO2 30 08/12/2022   GLUCOSE 101 (H) 08/12/2022   BUN 17 08/12/2022   CREATININE 0.77 08/12/2022   BILITOT 0.9 08/12/2022   ALKPHOS 109 10/22/2021   AST 29 08/12/2022   ALT 27 08/12/2022   PROT 6.2 08/12/2022   ALBUMIN 4.1 10/22/2021   CALCIUM 10.1 08/12/2022   GFRAA >60 01/21/2020    Speciality Comments: Avoid oral bisphosphonates-history of GER Forteo started September 26, 2021  Procedures:  No procedures performed Allergies: Celebrex [celecoxib] and Mobic [meloxicam]   Assessment / Plan:     Visit Diagnoses: Primary osteoarthritis of right knee  Chronic pain of right knee  Primary osteoarthritis of left knee  Chronic pain of both shoulders  Primary osteoarthritis of both hands  Trigger thumb of right hand  Trigger thumb, left  thumb  DDD (degenerative disc disease), lumbar  Age-related osteoporosis without current pathological fracture  Fusion of lumbar spine  Vitamin D deficiency  Medication monitoring encounter  Coronary artery disease of native artery of native heart with stable angina pectoris (HCC)  Atherosclerosis  Pure hypercholesterolemia  Diabetes mellitus type 1, uncomplicated (HCC)  Moderate chronic obstructive pulmonary disease (HCC)  History of gastroesophageal reflux (GERD)  History of sepsis  Trichotillomania  Nicotine dependence, cigarettes, uncomplicated  History of anxiety  Multiple actinic keratoses  Orders: No orders of the defined  types were placed in this encounter.  No orders of the defined types were placed in this encounter.   Face-to-face time spent with patient was *** minutes. Greater than 50% of time was spent in counseling and coordination of care.  Follow-Up Instructions: No follow-ups on file.   Gearldine Bienenstock, PA-C  Note - This record has been created using Dragon software.  Chart creation errors have been sought, but may not always  have been located. Such creation errors do not reflect on  the standard of medical care.

## 2022-10-03 ENCOUNTER — Ambulatory Visit: Payer: BC Managed Care – PPO | Admitting: Physician Assistant

## 2022-10-03 DIAGNOSIS — I10 Essential (primary) hypertension: Secondary | ICD-10-CM | POA: Diagnosis not present

## 2022-10-03 DIAGNOSIS — Z5181 Encounter for therapeutic drug level monitoring: Secondary | ICD-10-CM

## 2022-10-03 DIAGNOSIS — M65312 Trigger thumb, left thumb: Secondary | ICD-10-CM

## 2022-10-03 DIAGNOSIS — F17210 Nicotine dependence, cigarettes, uncomplicated: Secondary | ICD-10-CM

## 2022-10-03 DIAGNOSIS — L570 Actinic keratosis: Secondary | ICD-10-CM

## 2022-10-03 DIAGNOSIS — I7090 Unspecified atherosclerosis: Secondary | ICD-10-CM

## 2022-10-03 DIAGNOSIS — M1711 Unilateral primary osteoarthritis, right knee: Secondary | ICD-10-CM

## 2022-10-03 DIAGNOSIS — I25118 Atherosclerotic heart disease of native coronary artery with other forms of angina pectoris: Secondary | ICD-10-CM

## 2022-10-03 DIAGNOSIS — M19041 Primary osteoarthritis, right hand: Secondary | ICD-10-CM

## 2022-10-03 DIAGNOSIS — J449 Chronic obstructive pulmonary disease, unspecified: Secondary | ICD-10-CM

## 2022-10-03 DIAGNOSIS — Z8659 Personal history of other mental and behavioral disorders: Secondary | ICD-10-CM

## 2022-10-03 DIAGNOSIS — M1712 Unilateral primary osteoarthritis, left knee: Secondary | ICD-10-CM

## 2022-10-03 DIAGNOSIS — E785 Hyperlipidemia, unspecified: Secondary | ICD-10-CM | POA: Diagnosis not present

## 2022-10-03 DIAGNOSIS — E7800 Pure hypercholesterolemia, unspecified: Secondary | ICD-10-CM

## 2022-10-03 DIAGNOSIS — E559 Vitamin D deficiency, unspecified: Secondary | ICD-10-CM

## 2022-10-03 DIAGNOSIS — F633 Trichotillomania: Secondary | ICD-10-CM

## 2022-10-03 DIAGNOSIS — G8929 Other chronic pain: Secondary | ICD-10-CM

## 2022-10-03 DIAGNOSIS — M5136 Other intervertebral disc degeneration, lumbar region: Secondary | ICD-10-CM

## 2022-10-03 DIAGNOSIS — R0789 Other chest pain: Secondary | ICD-10-CM | POA: Diagnosis not present

## 2022-10-03 DIAGNOSIS — Z8719 Personal history of other diseases of the digestive system: Secondary | ICD-10-CM

## 2022-10-03 DIAGNOSIS — K219 Gastro-esophageal reflux disease without esophagitis: Secondary | ICD-10-CM

## 2022-10-03 DIAGNOSIS — M4326 Fusion of spine, lumbar region: Secondary | ICD-10-CM

## 2022-10-03 DIAGNOSIS — Z720 Tobacco use: Secondary | ICD-10-CM | POA: Diagnosis not present

## 2022-10-03 DIAGNOSIS — E109 Type 1 diabetes mellitus without complications: Secondary | ICD-10-CM

## 2022-10-03 DIAGNOSIS — M810 Age-related osteoporosis without current pathological fracture: Secondary | ICD-10-CM

## 2022-10-03 DIAGNOSIS — M65311 Trigger thumb, right thumb: Secondary | ICD-10-CM

## 2022-10-03 DIAGNOSIS — Z8619 Personal history of other infectious and parasitic diseases: Secondary | ICD-10-CM

## 2022-10-03 DIAGNOSIS — Z794 Long term (current) use of insulin: Secondary | ICD-10-CM

## 2022-10-06 ENCOUNTER — Telehealth: Payer: Self-pay | Admitting: Cardiology

## 2022-10-06 ENCOUNTER — Other Ambulatory Visit: Payer: Self-pay

## 2022-10-06 NOTE — Telephone Encounter (Signed)
Patient returned the phone message and she was informed that Dr. Bing Matter recommended having a Direct LDL lab draw done to confirm the LDL level that was drawn at [facility]. Patient stated that she would probably come by the office to have the lab draw done on Wednesday. Patient was agreeable with this plan and had no further questions at this time.

## 2022-10-06 NOTE — Telephone Encounter (Signed)
Patient states that she was in the hospital and her chrolestrol was .4 and overall it was low. PCP told he to give our office a call. Please advise

## 2022-10-06 NOTE — Telephone Encounter (Signed)
Called patient and she reported that while she was in the hospital at [facility] her LDL lab result was 0.4. I printed off the lab result and showed Dr. Bing Matter and he recommended  for the patient to have a direct LDL lab drawn to confirm the hospital result. Attempted to call the patient to inform her of Dr. Vanetta Shawl recommendation and she did not answer the phone. Left message for the patient to call back.

## 2022-10-07 ENCOUNTER — Other Ambulatory Visit: Payer: Self-pay

## 2022-10-07 DIAGNOSIS — E782 Mixed hyperlipidemia: Secondary | ICD-10-CM

## 2022-10-09 LAB — LDL CHOLESTEROL, DIRECT: LDL Direct: 31 mg/dL (ref 0–99)

## 2022-10-10 ENCOUNTER — Telehealth: Payer: Self-pay

## 2022-10-10 ENCOUNTER — Telehealth: Payer: Self-pay | Admitting: Cardiology

## 2022-10-10 NOTE — Telephone Encounter (Signed)
Spoke with pt, aware LDL is at goal, no change in medications.

## 2022-10-10 NOTE — Telephone Encounter (Signed)
Left message on My Chart with normal results per Dr. Krasowski's note. Routed to PCP. 

## 2022-10-10 NOTE — Telephone Encounter (Signed)
Patient called to follow-up on test results and if she will her medication adjusted.

## 2022-10-13 ENCOUNTER — Telehealth: Payer: Self-pay

## 2022-10-13 NOTE — Telephone Encounter (Signed)
Pt viewed reviewed results in My Chart. Routed to PCP.

## 2022-10-22 ENCOUNTER — Encounter: Payer: Self-pay | Admitting: Cardiology

## 2022-10-23 ENCOUNTER — Encounter: Payer: Self-pay | Admitting: Cardiology

## 2022-10-24 ENCOUNTER — Telehealth: Payer: Self-pay | Admitting: Cardiology

## 2022-10-24 MED ORDER — METOPROLOL SUCCINATE ER 25 MG PO TB24: 25.0000 mg | ORAL_TABLET | Freq: Every day | ORAL | 3 refills | Status: DC

## 2022-10-24 NOTE — Telephone Encounter (Signed)
Patient is calling back for update. Please advise  

## 2022-10-24 NOTE — Telephone Encounter (Signed)
Spoke with pt. She stated that she was having increased HR and increase BP. She stated that she was a little short of breath with pulling the hose. Per Dr. Dulce Sellar will send in Metoprolol XL q d. Encouraged pt to continue to check BP and if she has any problems or questions to call. Encouraged to make appt to follow up. Pt agreed and verbalized understanding.

## 2022-10-24 NOTE — Telephone Encounter (Signed)
Patient calling in regarding her mychart messages. Please advise

## 2022-12-03 ENCOUNTER — Other Ambulatory Visit: Payer: Self-pay | Admitting: Physician Assistant

## 2022-12-03 DIAGNOSIS — M810 Age-related osteoporosis without current pathological fracture: Secondary | ICD-10-CM

## 2022-12-03 NOTE — Telephone Encounter (Signed)
Last Fill: 07/03/2022  Labs: 08/12/2022 CBC and CMP are normal.  Vitamin D is 60 which is normal and in the desirable range.   Next Visit: 02/12/2023  Last Visit: 08/12/2022  DX: Age-related osteoporosis without current pathological fracture   Current Dose per office note 08/12/2022: Forteo daily injections on 09/30/21   Okay to refill Forteo?

## 2022-12-24 ENCOUNTER — Ambulatory Visit: Payer: BC Managed Care – PPO | Admitting: Internal Medicine

## 2023-01-29 ENCOUNTER — Telehealth: Payer: Self-pay | Admitting: Pharmacy Technician

## 2023-01-29 ENCOUNTER — Other Ambulatory Visit (HOSPITAL_COMMUNITY): Payer: Self-pay

## 2023-01-29 NOTE — Telephone Encounter (Signed)
Pharmacy Patient Advocate Encounter   Received notification from Fax that prior authorization for repatha is required/requested.   Insurance verification completed.   The patient is insured through [employer] .   Per test claim: PA required; PA submitted to [employer] [facility] via CoverMyMeds Key/confirmation #/[id number] FAXED Status is pending

## 2023-01-30 NOTE — Telephone Encounter (Signed)
Pharmacy Patient Advocate Encounter  Received notification from [employer] [facility] that Prior Authorization for repatha has been APPROVED from 01/29/23 to 01/29/24   PA #/Case ID/Reference #: [id number]

## 2023-02-02 NOTE — Progress Notes (Signed)
Office Visit Note  Patient: [name]             Date of Birth: [birth year]           MRN: [id number]             PCP: Gordan Payment., MD Referring: Gordan Payment., MD Visit Date: 02/12/2023 Occupation: @GUAROCC @  Subjective:  Medication management  History of Present Illness: [name] is a 62 y.o. female with osteoarthritis, degenerative disc disease and osteoporosis.  She continues to have pain and discomfort in her right knee, both shoulders, lower back and recently some in the upper back.  She states she continues to have some discomfort in her both legs.  She has been taking Forteo injections on a daily basis since September 26, 2021.  She takes calcium and vitamin D.  She did pool exercises during the summertime.  Currently she is not exercising as much.    Activities of Daily Living:  Patient reports morning stiffness for 10 minutes.   Patient Reports nocturnal pain.  Difficulty dressing/grooming: Denies Difficulty climbing stairs: Denies Difficulty getting out of chair: Denies Difficulty using hands for taps, buttons, cutlery, and/or writing: Denies  Review of Systems  Constitutional:  Positive for fatigue.  HENT:  Negative for mouth sores and mouth dryness.   Eyes:  Negative for dryness.  Respiratory:  Negative for shortness of breath.   Cardiovascular:  Negative for palpitations.  Gastrointestinal:  Negative for blood in stool, constipation and diarrhea.  Endocrine: Negative for increased urination.  Genitourinary:  Negative for involuntary urination.  Musculoskeletal:  Positive for joint pain, gait problem, joint pain, joint swelling and morning stiffness. Negative for myalgias, muscle weakness, muscle tenderness and myalgias.  Skin:  Negative for color change, rash and sensitivity to sunlight.  Allergic/Immunologic: Positive for susceptible to infections.  Neurological:  Negative for dizziness and headaches.  Hematological:  Negative for swollen glands.   Psychiatric/Behavioral:  Positive for sleep disturbance. Negative for depressed mood. The patient is not nervous/anxious.     PMFS History:  Patient Active Problem List   Diagnosis Date Noted   DOE (dyspnea on exertion) 05/16/2022   Influenzal pneumonia 05/15/2022   Osteoarthritis of right knee 12/07/2021   Dermatographic urticaria 10/18/2021   S/P lumbar laminectomy 09/18/2021   Elevated parathyroid hormone 06/06/2021   Osteoporosis of multiple sites without pathological fracture 06/06/2021   Accelerating angina (HCC) 05/06/2021   Complex regional pain syndrome of lower limb 03/07/2021   Pelvic pain 12/14/2020   RUQ pain 11/09/2020   Elevated alkaline phosphatase level 11/09/2020   Uncomplicated type 1 diabetes mellitus (HCC) 10/22/2020   Lipoma of left upper extremity 09/04/2020   Irritable bowel syndrome 09/03/2020   Idiopathic peripheral neuropathy 08/29/2020   Lipoma of abdominal wall 08/08/2020   Fever 07/18/2020   Hypotension 07/18/2020   Sepsis (HCC) 07/18/2020   Pneumonia 07/18/2020   Arthritis 07/18/2020   Blurred vision 07/11/2020   Facial tingling 07/11/2020   Diabetes mellitus without complication (HCC)    Acute left ankle pain 03/09/2020   History of pneumonia 01/25/2020   History of sepsis 01/25/2020   Respiratory failure (HCC) 01/13/2020   HAP (hospital-acquired pneumonia) 01/13/2020   Fusion of lumbar spine 01/11/2020   Trigger thumb of right hand 11/14/2019   Low back pain 10/22/2019   Obsessive-compulsive disorder 10/14/2019   Diabetes mellitus due to underlying condition with unspecified complications (HCC) 09/15/2019   Primary osteoarthritis involving multiple joints 08/24/2019  Alcohol use 04/15/2019   Elevated LFTs 03/10/2019   Lumbar post-laminectomy syndrome 03/04/2019   Acute bilateral thoracic back pain 12/03/2018   Pain of left hip joint 07/23/2018   Polyarthralgia 06/11/2018   Bursitis of both hips 04/14/2018   Left foot pain 01/28/2018    CAD (coronary artery disease) 12/24/2017   Secondary diabetes mellitus (HCC) 12/24/2017   Coronary arteriosclerosis 12/24/2017   Dyspnea on exertion 12/04/2017   Attention deficit 12/04/2017   Intermittent claudication (HCC) 12/04/2017   History of metabolic disorder 12/04/2017   Lung mass 12/04/2017   Myositis 12/04/2017   Erythrocytosis 12/04/2017   Senile purpura (HCC) 12/04/2017   Chest pain 11/06/2017   Multiple actinic keratoses 11/06/2017   Degeneration of lumbar intervertebral disc 11/06/2017   Fatigue 11/06/2017   Insulin long-term use (HCC) 11/06/2017   Menopausal disorder 11/06/2017   Purpura (HCC) 11/06/2017   Moderate chronic obstructive pulmonary disease (HCC) 11/06/2017   Folic acid deficiency 11/06/2017   Malaise and fatigue 11/06/2017   Arteriosclerotic vascular disease 11/06/2017   Dysphagia 11/04/2017   Sciatica 11/03/2017   Scoliosis deformity of spine 11/03/2017   Lumbar radiculopathy 05/26/2017   Personal history of (healed) traumatic fracture 12/31/2016   Deficiency of other specified B group vitamins 11/07/2016   Mixed hyperlipidemia 04/09/2016   Smoking greater than 30 pack years 02/26/2016   Cigarette smoker 02/26/2016   Nicotine dependence, cigarettes, uncomplicated 02/26/2016   Diabetes mellitus type 1, uncomplicated (HCC) 02/11/2016   Tobacco abuse 02/11/2016   Tobacco user 02/11/2016   Pure hypercholesterolemia 02/11/2016   Atherosclerosis of native coronary artery of native heart without angina pectoris 02/11/2016   Coronary artery disease of native artery of native heart with stable angina pectoris (HCC) 01/25/2016   Abnormal thyroid function test 12/06/2015   Chronic bronchitis (HCC) 12/04/2015   Overweight with body mass index (BMI) 25.0-29.9 07/11/2015   BMI 27.0-27.9,adult 07/11/2015   Pain in both lower extremities 07/10/2015   Anxiety disorder 06/05/2015   Atherosclerosis 06/05/2015   Hemangioma 06/05/2015   Primary insomnia  06/05/2015   Trichotillomania 06/05/2015   HNP (herniated nucleus pulposus), lumbar 05/10/2014    Past Medical History:  Diagnosis Date   Abnormal thyroid function test 12/06/2015   Accelerating angina (HCC) 05/06/2021   Acute bilateral thoracic back pain 12/03/2018   Acute left ankle pain 03/09/2020   Alcohol use 04/15/2019   Formatting of this note might be different from the original. Recommend abstinence from alcohol   Anxiety disorder 06/05/2015   Last Assessment & Plan:  Formatting of this note might be different from the original. Had started her on some xanax about week ago and she feels it has helped her and she is taking about half tablet for this and will continue with it as she has been compliant with it   Arteriosclerotic vascular disease 11/06/2017   Arthritis    back    Atherosclerosis 06/05/2015   Formatting of this note might be different from the original. Seen on CTs of chest.   Atherosclerosis of native coronary artery of native heart without angina pectoris 02/11/2016   Attention deficit 12/04/2017   Blurred vision 07/11/2020   BMI 27.0-27.9,adult 07/11/2015   Last Assessment & Plan:  Formatting of this note might be different from the original. Relevant Hx: Course: Daily Update: Today's Plan:difficult for her with the insulin as well as her abilify and the weight is increaseing for her rather than decreasing, she has cut her alcohol intake back, she has worked on  her diet more with decreasing her carb intake as well and still difficulty discussed with    Bursitis of both hips 04/14/2018   CAD (coronary artery disease) 12/24/2017   Chest pain 11/06/2017   Chronic bronchitis (HCC) 12/04/2015   Formatting of this note might be different from the original. PFT ratio 88% FEV1 103% FVC 94% DLCO 76%  Last Assessment & Plan:  Formatting of this note might be different from the original. We discussed this as she has been coughing again for about 2 mnths and she sounds congested  on exam with previous ease for pneumonia will place her on levaquin and have reviewed with her the importance of her    Cigarette smoker 02/26/2016   Complex regional pain syndrome of lower limb 03/07/2021   Coronary arteriosclerosis 12/24/2017   Coronary artery disease of native artery of native heart with stable angina pectoris (HCC) 01/25/2016   Formatting of this note might be different from the original. 2017:  50% LAD lesion.  Calcium noted other places.  Heart cath December 31, 2017 with a 30% ostial LAD lesion otherwise normal coronary arteries seen with LVEF 55 to 65%   Deficiency of other specified B group vitamins 11/07/2016   Degeneration of lumbar intervertebral disc 11/06/2017   Diabetes mellitus due to underlying condition with unspecified complications (HCC) 09/15/2019   Diabetes mellitus type 1, uncomplicated (HCC) 02/11/2016   Added automatically from request for surgery [id number]  Formatting of this note might be different from the original. Added automatically from request for surgery [id number]   Diabetes mellitus without complication (HCC)    Type 1- injections only- recent change to Toujeo with improved A1C reading.   Dysphagia 11/04/2017   Dyspnea on exertion 12/04/2017   Elevated alkaline phosphatase level 11/09/2020   Elevated LFTs 03/10/2019   Formatting of this note might be different from the original. History.  Cannot change to this.   Elevated parathyroid hormone 06/06/2021   Erythrocytosis 12/04/2017   Facial tingling 07/11/2020   Fatigue 11/06/2017   Fever 07/18/2020   Folic acid deficiency 11/06/2017   Fusion of lumbar spine 01/11/2020   HAP (hospital-acquired pneumonia) 01/13/2020   Hemangioma 11/06/2017   History of metabolic disorder 12/04/2017   History of pneumonia 01/25/2020   Formatting of this note might be different from the original. Recent mutifocal pneumonia September 2021 Formatting of this note might be different from the original. Formatting of  this note might be different from the original. Recent mutifocal pneumonia September 2021   History of sepsis 01/25/2020   Formatting of this note might be different from the original. Recent septic syndrome with multifocal pneumonia Formatting of this note might be different from the original. Formatting of this note might be different from the original. Recent septic syndrome with multifocal pneumonia   HNP (herniated nucleus pulposus), lumbar 05/10/2014   Hypotension 07/18/2020   Idiopathic peripheral neuropathy 08/29/2020   Insulin long-term use (HCC) 11/06/2017   Intermittent claudication (HCC) 12/04/2017   Irritable bowel syndrome 09/03/2020   Left foot pain 01/28/2018   Lipoma of abdominal wall 08/08/2020   Lipoma of left upper extremity 09/04/2020   Low back pain 10/22/2019   Lumbar post-laminectomy syndrome 03/04/2019   Lumbar radiculopathy 05/26/2017   Lung mass 12/04/2017   Malaise and fatigue 11/06/2017   Menopausal disorder 11/06/2017   Mixed hyperlipidemia 04/09/2016   Formatting of this note might be different from the original. Added automatically from request for surgery [id number]   Moderate chronic obstructive  pulmonary disease (HCC) 11/06/2017   Multiple actinic keratoses 11/06/2017   Myositis 12/04/2017   Nicotine dependence, cigarettes, uncomplicated 02/26/2016   Obsessive-compulsive disorder 10/14/2019   Formatting of this note might be different from the original. Formerly followed with [facility].   Osteoporosis of multiple sites without pathological fracture 06/06/2021   Overweight with body mass index (BMI) 25.0-29.9 07/11/2015   Last Assessment & Plan:  Relevant Hx: Course: Daily Update: Today's Plan:difficult for her with the insulin as well as her abilify and the weight is increaseing for her rather than decreasing, she has cut her alcohol intake back, she has worked on her diet more with decreasing her carb intake as well and still difficulty discussed with the  saxenda and will try to get this approved for her and see    Pain in both lower extremities 07/10/2015   Last Assessment & Plan:  Formatting of this note might be different from the original. Relevant Hx: Course: Daily Update: Today's Plan:she had pain to her right anterior thigh there is edema of both legs and she has fear for DVT which she is going to have [place] of and confirm ok, she has increased her weight which is part of her edema I believe as well as her insulin usage. She is advised as well tha   Pain of left hip joint 07/23/2018   Pelvic pain 12/14/2020   Personal history of (healed) traumatic fracture 12/31/2016   Pneumonia    Polyarthralgia 06/11/2018   Primary insomnia 06/05/2015   Primary osteoarthritis involving multiple joints 08/24/2019   Pure hypercholesterolemia 02/11/2016   Purpura (HCC) 11/06/2017   Respiratory failure (HCC) 11/2018   RUQ pain 11/09/2020   S/P lumbar laminectomy 09/18/2021   Sciatica 11/03/2017   Scoliosis deformity of spine 11/03/2017   Secondary diabetes mellitus (HCC) 12/24/2017   Senile purpura (HCC) 12/04/2017   Sepsis (HCC) 07/18/2020   Smoking greater than 30 pack years 02/26/2016   Tobacco abuse 02/11/2016   Tobacco user 02/11/2016   Trichotillomania    hx.    Trigger thumb of right hand 11/14/2019   Uncomplicated type 1 diabetes mellitus (HCC) 10/22/2020    Family History  Problem Relation Age of Onset   Diabetes Mother    Cancer Mother    Hypertension Father    Heart disease Father    Diabetes Father    Healthy Brother    Healthy Son    Diabetes Son    Healthy Daughter    Past Surgical History:  Procedure Laterality Date   ABDOMINAL EXPOSURE N/A 01/11/2020   Procedure: ABDOMINAL EXPOSURE;  Surgeon: Cephus Shelling, MD;  Location: [facility] OR;  Service: Vascular;  Laterality: N/A;   ANTERIOR LUMBAR FUSION N/A 01/11/2020   Procedure: ANTERIOR LUMBAR INTERBODY FUSION  LUMBAR FIVE-SACRAL ONE, LEFT LUMBAR TWO-THREE FORAMINOTOMY,  DISECTOMY;  Surgeon: Venita Lick, MD;  Location: [facility] OR;  Service: Orthopedics;  Laterality: N/A;   APPENDECTOMY  04/28/1970   BACK SURGERY  2016   CARDIAC CATHETERIZATION  12/31/2017   COLONOSCOPY  04/28/2017   CORONARY PRESSURE/FFR STUDY N/A 05/06/2021   Procedure: INTRAVASCULAR PRESSURE WIRE/FFR STUDY;  Surgeon: Yvonne Kendall, MD;  Location: [facility] INVASIVE CV LAB;  Service: Cardiovascular;  Laterality: N/A;   EXCISION/RELEASE BURSA HIP Left 04/14/2018   Procedure: Excision trochanteric bursa left hip;  Surgeon: Jene Every, MD;  Location: [facility] ORS;  Service: Orthopedics;  Laterality: Left;  60 mins   HAND SURGERY     HIP OPEN REDUCTION Right  ORIF   KNEE ARTHROSCOPY     LEFT HEART CATH AND CORONARY ANGIOGRAPHY N/A 12/31/2017   Procedure: LEFT HEART CATH AND CORONARY ANGIOGRAPHY;  Surgeon: [place], Peter M, MD;  Location: [facility] INVASIVE CV LAB;  Service: Cardiovascular;  Laterality: N/A;   LEFT HEART CATH AND CORONARY ANGIOGRAPHY N/A 05/06/2021   Procedure: LEFT HEART CATH AND CORONARY ANGIOGRAPHY;  Surgeon: Yvonne Kendall, MD;  Location: [facility] INVASIVE CV LAB;  Service: Cardiovascular;  Laterality: N/A;   LUMBAR LAMINECTOMY/DECOMPRESSION MICRODISCECTOMY Left 05/10/2014   Procedure: MICRODISCECTOMY LUMBAR DECOMPRESSION L5-S1 LEFT ;  Surgeon: Javier Docker, MD;  Location: [facility] ORS;  Service: Orthopedics;  Laterality: Left;   LUMBAR LAMINECTOMY/DECOMPRESSION MICRODISCECTOMY N/A 01/11/2020   Procedure: LUMBAR LAMINECTOMY/DECOMPRESSION MICRODISCECTOMY LEFT LUMBAR TWO-THREE;  Surgeon: Venita Lick, MD;  Location: [facility] OR;  Service: Orthopedics;  Laterality: N/A;   LUMBAR LAMINECTOMY/DECOMPRESSION MICRODISCECTOMY Right 09/18/2021   Procedure: Laminectomy and Foraminotomy - Lumbar Four-Lumbar Five - right;  Surgeon: Tia Alert, MD;  Location[facility];  Service: Neurosurgery;  Laterality: Right;  Laminectomy and Foraminotomy - Lumbar Four-Lumbar Five - right   ORIF WRIST FRACTURE Right    retained  hardware   ORTHOPAEDIC SURGERY  04/14/2018   TUBAL LIGATION  12/25/1995   Social History   Social History Narrative   ** Merged History Encounter **       Immunization History  Administered Date(s) Administered   Influenza,inj,Quad PF,6+ Mos 01/21/2016, 12/31/2016, 02/09/2018, 01/18/2021   Influenza,inj,quad, With Preservative 12/27/2016   Influenza-Unspecified 01/21/2016, 12/31/2016   Moderna Sars-Covid-2 Vaccination 04/29/2019, 05/30/2019   Pneumococcal Conjugate-13 07/23/2017   Pneumococcal Polysaccharide-23 02/01/2021   Unspecified SARS-COV-2 Vaccination 02/28/2020     Objective: Vital Signs: BP 117/78 (BP Location: Left Arm, Patient Position: Sitting, Cuff Size: Normal)   Pulse 80   Resp 14   Ht 5\' 2"  (1.575 m)   Wt 150 lb (68 kg)   LMP 05/04/2008   BMI 27.44 kg/m    Physical Exam Vitals and nursing note reviewed.  Constitutional:      Appearance: She is well-developed.  HENT:     Head: Normocephalic and atraumatic.  Eyes:     Conjunctiva/sclera: Conjunctivae normal.  Cardiovascular:     Rate and Rhythm: Normal rate and regular rhythm.     Heart sounds: Normal heart sounds.  Pulmonary:     Effort: Pulmonary effort is normal.     Breath sounds: Normal breath sounds.  Abdominal:     General: Bowel sounds are normal.     Palpations: Abdomen is soft.  Musculoskeletal:     Cervical back: Normal range of motion.  Lymphadenopathy:     Cervical: No cervical adenopathy.  Skin:    General: Skin is warm and dry.     Capillary Refill: Capillary refill takes less than 2 seconds.  Neurological:     Mental Status: She is alert and oriented to person, place, and time.  Psychiatric:        Behavior: Behavior normal.      Musculoskeletal Exam: Cervical spine was in good range of motion.  There was no tenderness over thoracic or lumbar spine.  Shoulder joints, elbow joints, wrist joints, MCPs PIPs and DIPs with good range of motion.  Bilateral CMC, PIP and DIP  thickening with no synovitis was noted.  Hip joints and knee joints were in good range of motion.  There was no tenderness over ankles or MTPs.  CDAI Exam: CDAI Score: -- Patient Global: --; Provider Global: -- Swollen: --;  Tender: -- Joint Exam 02/12/2023   No joint exam has been documented for this visit   There is currently no information documented on the homunculus. Go to the Rheumatology activity and complete the homunculus joint exam.  Investigation: No additional findings.  Imaging: No results found.  Recent Labs: Lab Results  Component Value Date   WBC 6.3 08/12/2022   HGB 13.8 08/12/2022   PLT 223 08/12/2022   NA 140 08/12/2022   K 3.9 08/12/2022   CL 99 08/12/2022   CO2 30 08/12/2022   GLUCOSE 101 (H) 08/12/2022   BUN 17 08/12/2022   CREATININE 0.77 08/12/2022   BILITOT 0.9 08/12/2022   ALKPHOS 109 10/22/2021   AST 29 08/12/2022   ALT 27 08/12/2022   PROT 6.2 08/12/2022   ALBUMIN 4.1 10/22/2021   CALCIUM 10.1 08/12/2022   GFRAA >60 01/21/2020   January 22, 2023 RF negative, CBC WBC 8.1, hemoglobin 14.2, platelets 201, CMP creatinine 0.98, AST and ALT normal.  Calcium 9.3  Speciality Comments: Avoid oral bisphosphonates-history of GER Forteo started September 26, 2021  Procedures:  No procedures performed Allergies: Celebrex [celecoxib] and Mobic [meloxicam]   Assessment / Plan:     Visit Diagnoses: Primary osteoarthritis of right knee-she continues to have intermittent discomfort in her right knee joint.  No warmth swelling or effusion was noted.  She had cortisone injection on August 12, 2022 which gave her some relief.  Lower extremity muscle strengthening exercises were discussed.  Chronic pain of right knee  Primary osteoarthritis of left knee-she has intermittent discomfort.  No warmth swelling or effusion was noted.  Chronic pain of both shoulders-she has chronic discomfort in her shoulders.  Both shoulder joints with good range of motion without  any discomfort.  She has a history of rotator cuff tear.  Primary osteoarthritis of both hands-bilateral PIP and DIP thickening was noted.  No synovitis was noted.  Joint protection muscle strengthening was discussed.  Trigger thumb of right hand -followed at [facility].  She is currently asymptomatic.  Trigger thumb, left thumb -followed at [facility]  Degeneration of intervertebral disc of lumbar region, unspecified whether pain present -patient gives history of chronic discomfort.  She had limited mobility without any discomfort on the examination today.  She denies any radiculopathy.  Status post discectomy L2-L3 and L4-L5.  L4-L5 laminectomy and microdiscectomy on Sep 18, 2021 by Dr. Yetta Barre.  Fusion of lumbar spine - Status post L5-S1 fusion September 2021 by Dr. Shon Baton.  Sep 18, 2021 L4-L5 core decompression by Dr. Marikay Alar.  Age-related osteoporosis without current pathological fracture - DXA 03/20/21: BMD LFN 0.672 with T-score -2.6. Patient was started on Forteo daily injections on 09/30/21.  Will get DEXA scan next year.  She has been taking calcium and vitamin D.  We may consider IV Reclast after Forteo.  Medication monitoring encounter-she had labs at [facility] on January 22, 2023 CBC and CMP were normal.  Calcium was normal.  Vitamin D deficiency-patient has been taking vitamin D and her vitamin D has been normal.  Other medical problems are listed as follows:  Coronary artery disease of native artery of native heart with stable angina pectoris (HCC)  Pure hypercholesterolemia  Atherosclerosis  Diabetes mellitus type 1, uncomplicated (HCC)  History of gastroesophageal reflux (GERD)  History of sepsis  Moderate chronic obstructive pulmonary disease (HCC)  Trichotillomania  Nicotine dependence, cigarettes, uncomplicated  History of anxiety  Multiple actinic keratoses  Orders: No orders of the defined types were placed  in this encounter.  No orders of  the defined types were placed in this encounter.   Follow-Up Instructions: Return in about 6 months (around 08/13/2023) for Osteoporosis, Osteoarthritis.   Pollyann Savoy, MD  Note - This record has been created using Animal nutritionist.  Chart creation errors have been sought, but may not always  have been located. Such creation errors do not reflect on  the standard of medical care.

## 2023-02-12 ENCOUNTER — Ambulatory Visit: Payer: BC Managed Care – PPO | Attending: Rheumatology | Admitting: Rheumatology

## 2023-02-12 ENCOUNTER — Encounter: Payer: Self-pay | Admitting: Rheumatology

## 2023-02-12 VITALS — BP 117/78 | HR 80 | Resp 14 | Ht 62.0 in | Wt 150.0 lb

## 2023-02-12 DIAGNOSIS — M25511 Pain in right shoulder: Secondary | ICD-10-CM

## 2023-02-12 DIAGNOSIS — M25561 Pain in right knee: Secondary | ICD-10-CM | POA: Diagnosis not present

## 2023-02-12 DIAGNOSIS — M51369 Other intervertebral disc degeneration, lumbar region without mention of lumbar back pain or lower extremity pain: Secondary | ICD-10-CM

## 2023-02-12 DIAGNOSIS — M65312 Trigger thumb, left thumb: Secondary | ICD-10-CM

## 2023-02-12 DIAGNOSIS — J449 Chronic obstructive pulmonary disease, unspecified: Secondary | ICD-10-CM

## 2023-02-12 DIAGNOSIS — Z5181 Encounter for therapeutic drug level monitoring: Secondary | ICD-10-CM

## 2023-02-12 DIAGNOSIS — M25519 Pain in unspecified shoulder: Secondary | ICD-10-CM | POA: Diagnosis not present

## 2023-02-12 DIAGNOSIS — E109 Type 1 diabetes mellitus without complications: Secondary | ICD-10-CM

## 2023-02-12 DIAGNOSIS — G8929 Other chronic pain: Secondary | ICD-10-CM

## 2023-02-12 DIAGNOSIS — Z79899 Other long term (current) drug therapy: Secondary | ICD-10-CM

## 2023-02-12 DIAGNOSIS — I25118 Atherosclerotic heart disease of native coronary artery with other forms of angina pectoris: Secondary | ICD-10-CM

## 2023-02-12 DIAGNOSIS — F17210 Nicotine dependence, cigarettes, uncomplicated: Secondary | ICD-10-CM

## 2023-02-12 DIAGNOSIS — M65311 Trigger thumb, right thumb: Secondary | ICD-10-CM

## 2023-02-12 DIAGNOSIS — Z8659 Personal history of other mental and behavioral disorders: Secondary | ICD-10-CM

## 2023-02-12 DIAGNOSIS — M1712 Unilateral primary osteoarthritis, left knee: Secondary | ICD-10-CM

## 2023-02-12 DIAGNOSIS — L570 Actinic keratosis: Secondary | ICD-10-CM

## 2023-02-12 DIAGNOSIS — M25512 Pain in left shoulder: Secondary | ICD-10-CM

## 2023-02-12 DIAGNOSIS — M810 Age-related osteoporosis without current pathological fracture: Secondary | ICD-10-CM

## 2023-02-12 DIAGNOSIS — I7090 Unspecified atherosclerosis: Secondary | ICD-10-CM

## 2023-02-12 DIAGNOSIS — M4326 Fusion of spine, lumbar region: Secondary | ICD-10-CM

## 2023-02-12 DIAGNOSIS — M19041 Primary osteoarthritis, right hand: Secondary | ICD-10-CM

## 2023-02-12 DIAGNOSIS — M19049 Primary osteoarthritis, unspecified hand: Secondary | ICD-10-CM

## 2023-02-12 DIAGNOSIS — F633 Trichotillomania: Secondary | ICD-10-CM

## 2023-02-12 DIAGNOSIS — E7800 Pure hypercholesterolemia, unspecified: Secondary | ICD-10-CM

## 2023-02-12 DIAGNOSIS — M1711 Unilateral primary osteoarthritis, right knee: Secondary | ICD-10-CM

## 2023-02-12 DIAGNOSIS — Z8719 Personal history of other diseases of the digestive system: Secondary | ICD-10-CM

## 2023-02-12 DIAGNOSIS — M19042 Primary osteoarthritis, left hand: Secondary | ICD-10-CM

## 2023-02-12 DIAGNOSIS — E559 Vitamin D deficiency, unspecified: Secondary | ICD-10-CM

## 2023-02-12 DIAGNOSIS — Z8619 Personal history of other infectious and parasitic diseases: Secondary | ICD-10-CM

## 2023-02-13 ENCOUNTER — Telehealth: Payer: Self-pay | Admitting: Internal Medicine

## 2023-02-13 NOTE — Telephone Encounter (Signed)
Pt calling stating she rcvd a big envelope with a disc inside

## 2023-02-16 NOTE — Telephone Encounter (Signed)
Advised patient this is disc that she brought in to an appt being returned. Nothing further needed.

## 2023-02-20 ENCOUNTER — Telehealth: Payer: Self-pay | Admitting: *Deleted

## 2023-02-20 DIAGNOSIS — M810 Age-related osteoporosis without current pathological fracture: Secondary | ICD-10-CM

## 2023-02-20 MED ORDER — TERIPARATIDE 600 MCG/2.4ML ~~LOC~~ SOPN
20.0000 ug | PEN_INJECTOR | Freq: Every day | SUBCUTANEOUS | 2 refills | Status: DC
Start: 2023-02-20 — End: 2023-05-29

## 2023-02-20 NOTE — Addendum Note (Signed)
Addended by: Murrell Redden on: 02/20/2023 01:32 PM   Modules accepted: Orders

## 2023-02-20 NOTE — Telephone Encounter (Signed)
Called [facility] regarding patient's Forteo. We had deferring on switching to generic due to there not being a copay card available for the generic - therefore brand name was most cost-effective for the PATIENT.  Per rep, they dispense the [employer] with RPh, [place]. She has placed rx for generic on file with note to dispense most cost effective options for patients since generic may still end up being more expensive than brand.  Chesley Mires, PharmD, MPH, BCPS, CPP Clinical Pharmacist (Rheumatology and Pulmonology)

## 2023-02-20 NOTE — Telephone Encounter (Signed)
[facility] contacted the office to see if you are comfortable for them to dispense the generic for Forteo. Please advise.

## 2023-02-20 NOTE — Telephone Encounter (Signed)
Ok to switch to generic from an insurance standpoint?

## 2023-03-13 ENCOUNTER — Other Ambulatory Visit: Payer: Self-pay

## 2023-03-13 MED ORDER — REPATHA SURECLICK 140 MG/ML ~~LOC~~ SOAJ: 140.0000 mg | SUBCUTANEOUS | 0 refills | Status: DC

## 2023-03-24 ENCOUNTER — Encounter: Payer: Self-pay | Admitting: Cardiology

## 2023-03-24 ENCOUNTER — Ambulatory Visit: Payer: BC Managed Care – PPO | Attending: Cardiology | Admitting: Cardiology

## 2023-03-24 VITALS — BP 120/64 | HR 70 | Ht 62.0 in | Wt 151.2 lb

## 2023-03-24 DIAGNOSIS — R0609 Other forms of dyspnea: Secondary | ICD-10-CM

## 2023-03-24 DIAGNOSIS — D1803 Hemangioma of intra-abdominal structures: Secondary | ICD-10-CM | POA: Insufficient documentation

## 2023-03-24 DIAGNOSIS — K7689 Other specified diseases of liver: Secondary | ICD-10-CM | POA: Insufficient documentation

## 2023-03-24 DIAGNOSIS — E119 Type 2 diabetes mellitus without complications: Secondary | ICD-10-CM

## 2023-03-24 DIAGNOSIS — I2510 Atherosclerotic heart disease of native coronary artery without angina pectoris: Secondary | ICD-10-CM | POA: Diagnosis not present

## 2023-03-24 NOTE — Patient Instructions (Signed)

## 2023-03-24 NOTE — Progress Notes (Signed)
Cardiology Office Note:    Date:  03/24/2023   ID:  [name], DOB Sep 03, 1960, MRN [id number]  PCP:  Gordan Payment., MD  Cardiologist:  Gypsy Balsam, MD    Referring MD: Gordan Payment., MD   Chief Complaint  Patient presents with   Follow-up    History of Present Illness:    [name] is a 62 y.o. female    ith past medical history significant for coronary artery disease.  In 2019 she did have cardiac catheterization performed which showed 30% stenosis of the ostial LAD.  Otherwise there was normal anatomy.  In summer 2021 she got coronary CT angio performed which showed coronary calcium score 253 she was find to have moderate 50 to 69% stenosis of the proximal LAD.  To look like fractional flow reserve was not performed after that.  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  No significant disease noted in the left circumflex or RCA.   Comes today to months for follow-up we will doing well.  Denies have any chest pain tightness squeezing pressure burning chest.  She does have problem with the right knee she is supposed to have injection done.  Sadly she went back to smoking.  Past Medical History:  Diagnosis Date   Abnormal thyroid function test 12/06/2015   Accelerating angina (HCC) 05/06/2021   Acute bilateral thoracic back pain 12/03/2018   Acute left ankle pain 03/09/2020   Alcohol use 04/15/2019   Formatting of this note might be different from the original. Recommend abstinence from alcohol   Anxiety disorder 06/05/2015    Last Assessment & Plan:  Formatting of this note might be different from the original. Had started her on some xanax about week ago and she feels it has helped her and she is taking about half tablet for this and will continue with it as she has been compliant with it   Arteriosclerotic vascular disease 11/06/2017   Arthritis    back    Atherosclerosis 06/05/2015   Formatting of this note might be different from the original. Seen on CTs of chest.   Atherosclerosis of native coronary artery of native heart without angina pectoris 02/11/2016   Attention deficit 12/04/2017   Blurred vision 07/11/2020   BMI 27.0-27.9,adult 07/11/2015   Last Assessment & Plan:  Formatting of this note might be different from the original. Relevant Hx: Course: Daily Update: Today's Plan:difficult for her with the insulin as well as her abilify and the weight is increaseing for her rather than decreasing, she has cut her alcohol intake back, she has worked on her diet more with decreasing her carb intake as well and still difficulty discussed with    Bursitis of both hips 04/14/2018   CAD (coronary artery disease) 12/24/2017   Chest pain 11/06/2017   Chronic bronchitis (HCC) 12/04/2015   Formatting of this note might be different from the original. PFT ratio 88% FEV1 103% FVC 94% DLCO 76%  Last Assessment & Plan:  Formatting of this note might be different from the original. We discussed  this as she has been coughing again for about 2 mnths and she sounds congested on exam with previous ease for pneumonia will place her on levaquin and have reviewed with her the importance of her    Cigarette smoker 02/26/2016   Complex regional pain syndrome of lower limb 03/07/2021   Coronary arteriosclerosis 12/24/2017   Coronary artery disease of native artery of native heart with stable angina pectoris (HCC) 01/25/2016   Formatting of this note might be different from the original. 2017:  50% LAD lesion.  Calcium noted other  places.  Heart cath December 31, 2017 with a 30% ostial LAD lesion otherwise normal coronary arteries seen with LVEF 55 to 65%   Deficiency of other specified B group vitamins 11/07/2016   Degeneration of lumbar intervertebral disc 11/06/2017   Diabetes mellitus due to underlying condition with unspecified complications (HCC) 09/15/2019   Diabetes mellitus type 1, uncomplicated (HCC) 02/11/2016   Added automatically from request for surgery [id number]  Formatting of this note might be different from the original. Added automatically from request for surgery [id number]   Diabetes mellitus without complication (HCC)    Type 1- injections only- recent change to Toujeo with improved A1C reading.   Dysphagia 11/04/2017   Dyspnea on exertion 12/04/2017   Elevated alkaline phosphatase level 11/09/2020   Elevated LFTs 03/10/2019   Formatting of this note might be different from the original. History.  Cannot change to this.   Elevated parathyroid hormone 06/06/2021   Erythrocytosis 12/04/2017   Facial tingling 07/11/2020   Fatigue 11/06/2017   Fever 07/18/2020   Folic acid deficiency 11/06/2017   Fusion of lumbar spine 01/11/2020   HAP (hospital-acquired pneumonia) 01/13/2020   Hemangioma 11/06/2017   History of metabolic disorder 12/04/2017   History of pneumonia 01/25/2020   Formatting of this note might be different from the original. Recent mutifocal pneumonia September 2021 Formatting of this note might be different from the original. Formatting of this note might be different from the original. Recent mutifocal pneumonia September 2021   History of sepsis 01/25/2020   Formatting of this note might be different from the original. Recent septic syndrome with multifocal pneumonia Formatting of this note might be different from the original. Formatting of this note might be different from the original. Recent septic syndrome with multifocal pneumonia   HNP (herniated nucleus pulposus), lumbar 05/10/2014    Hypotension 07/18/2020   Idiopathic peripheral neuropathy 08/29/2020   Insulin long-term use (HCC) 11/06/2017   Intermittent claudication (HCC) 12/04/2017   Irritable bowel syndrome 09/03/2020   Left foot pain 01/28/2018   Lipoma of abdominal wall 08/08/2020   Lipoma of left upper extremity 09/04/2020   Low back pain 10/22/2019   Lumbar post-laminectomy syndrome 03/04/2019   Lumbar radiculopathy 05/26/2017   Lung mass 12/04/2017   Malaise and fatigue 11/06/2017   Menopausal disorder 11/06/2017   Mixed hyperlipidemia 04/09/2016   Formatting of this note might be different from the original. Added automatically from request for surgery [id number]   Moderate chronic obstructive pulmonary disease (HCC) 11/06/2017   Multiple actinic keratoses 11/06/2017   Myositis 12/04/2017   Nicotine dependence, cigarettes, uncomplicated 02/26/2016   Obsessive-compulsive disorder 10/14/2019   Formatting of this note might be different from the original. Formerly followed with [facility].   Osteoporosis of multiple sites without pathological fracture 06/06/2021   Overweight with body mass index (BMI) 25.0-29.9 07/11/2015   Last Assessment & Plan:  Relevant Hx: Course: Daily Update: Today's Plan:difficult for her with the  insulin as well as her abilify and the weight is increaseing for her rather than decreasing, she has cut her alcohol intake back, she has worked on her diet more with decreasing her carb intake as well and still difficulty discussed with the saxenda and will try to get this approved for her and see    Pain in both lower extremities 07/10/2015   Last Assessment & Plan:  Formatting of this note might be different from the original. Relevant Hx: Course: Daily Update: Today's Plan:she had pain to her right anterior thigh there is edema of both legs and she has fear for DVT which she is going to have [place] of and confirm ok, she has increased her weight which is part of her edema I believe as well as her  insulin usage. She is advised as well tha   Pain of left hip joint 07/23/2018   Pelvic pain 12/14/2020   Personal history of (healed) traumatic fracture 12/31/2016   Pneumonia    Polyarthralgia 06/11/2018   Primary insomnia 06/05/2015   Primary osteoarthritis involving multiple joints 08/24/2019   Pure hypercholesterolemia 02/11/2016   Purpura (HCC) 11/06/2017   Respiratory failure (HCC) 11/2018   RUQ pain 11/09/2020   S/P lumbar laminectomy 09/18/2021   Sciatica 11/03/2017   Scoliosis deformity of spine 11/03/2017   Secondary diabetes mellitus (HCC) 12/24/2017   Senile purpura (HCC) 12/04/2017   Sepsis (HCC) 07/18/2020   Smoking greater than 30 pack years 02/26/2016   Tobacco abuse 02/11/2016   Tobacco user 02/11/2016   Trichotillomania    hx.    Trigger thumb of right hand 11/14/2019   Uncomplicated type 1 diabetes mellitus (HCC) 10/22/2020    Past Surgical History:  Procedure Laterality Date   ABDOMINAL EXPOSURE N/A 01/11/2020   Procedure: ABDOMINAL EXPOSURE;  Surgeon: Cephus Shelling, MD;  [facility] OR;  Service: Vascular;  Laterality: N/A;   ANTERIOR LUMBAR FUSION N/A 01/11/2020   Procedure: ANTERIOR LUMBAR INTERBODY FUSION  LUMBAR FIVE-SACRAL ONE, LEFT LUMBAR TWO-THREE FORAMINOTOMY, DISECTOMY;  Surgeon: Venita Lick, MD;  Location: [facility] OR;  Service: Orthopedics;  Laterality: N/A;   APPENDECTOMY  04/28/1970   BACK SURGERY  2016   CARDIAC CATHETERIZATION  12/31/2017   COLONOSCOPY  04/28/2017   CORONARY PRESSURE/FFR STUDY N/A 05/06/2021   Procedure: INTRAVASCULAR PRESSURE WIRE/FFR STUDY;  Surgeon: Yvonne Kendall, MD;  Location: [facility] INVASIVE CV LAB;  Service: Cardiovascular;  Laterality: N/A;   EXCISION/RELEASE BURSA HIP Left 04/14/2018   Procedure: Excision trochanteric bursa left hip;  Surgeon: Jene Every, MD;  Location: [facility] ORS;  Service: Orthopedics;  Laterality: Left;  60 mins   HAND SURGERY     HIP OPEN REDUCTION Right    ORIF   KNEE ARTHROSCOPY     LEFT  HEART CATH AND CORONARY ANGIOGRAPHY N/A 12/31/2017   Procedure: LEFT HEART CATH AND CORONARY ANGIOGRAPHY;  Surgeon: [place]az[place], Peter M, MD;  Location: [facility] INVASIVE CV LAB;  Service: Cardiovascular;  Laterality: N/A;   LEFT HEART CATH AND CORONARY ANGIOGRAPHY N/A 05/06/2021   Procedure: LEFT HEART CATH AND CORONARY ANGIOGRAPHY;  Surgeon: Yvonne Kendall, MD;  Location: [facility] INVASIVE CV LAB;  Service: Cardiovascular;  Laterality: N/A;   LUMBAR LAMINECTOMY/DECOMPRESSION MICRODISCECTOMY Left 05/10/2014   Procedure: MICRODISCECTOMY LUMBAR DECOMPRESSION L5-S1 LEFT ;  Surgeon: Javier Docker, MD;  Location: [facility] ORS;  Service: Orthopedics;  Laterality: Left;   LUMBAR LAMINECTOMY/DECOMPRESSION MICRODISCECTOMY N/A 01/11/2020   Procedure: LUMBAR LAMINECTOMY/DECOMPRESSION MICRODISCECTOMY LEFT LUMBAR TWO-THREE;  Surgeon: Venita Lick, MD;  Location: [facility]  OR;  Service: Orthopedics;  Laterality: N/A;   LUMBAR LAMINECTOMY/DECOMPRESSION MICRODISCECTOMY Right 09/18/2021   Procedure: Laminectomy and Foraminotomy - Lumbar Four-Lumbar Five - right;  Surgeon: Tia Alert, MD;  Location: [facility] OR;  Service: Neurosurgery;  Laterality: Right;  Laminectomy and Foraminotomy - Lumbar Four-Lumbar Five - right   ORIF WRIST FRACTURE Right    retained hardware   ORTHOPAEDIC SURGERY  04/14/2018   TUBAL LIGATION  12/25/1995    Current Medications: Current Meds  Medication Sig   ALPRAZolam (XANAX) 0.5 MG tablet Take 0.5 mg by mouth at bedtime.    aspirin EC 81 MG tablet Take 1 tablet (81 mg total) by mouth daily. Swallow whole.   atorvastatin (LIPITOR) 80 MG tablet Take 80 mg by mouth daily.   cyanocobalamin (,VITAMIN B-12,) 1000 MCG/ML injection Inject 1,000 mcg into the muscle every 14 (fourteen) days.   Evolocumab (REPATHA SURECLICK) 140 MG/ML SOAJ Inject 140 mg into the skin every 14 (fourteen) days.   finasteride (PROSCAR) 5 MG tablet Take 2.5 mg by mouth every evening.   FLUoxetine (PROZAC) 40 MG capsule Take 80 mg by mouth every  evening.   folic acid (FOLVITE) 1 MG tablet Take 1 mg by mouth at bedtime.    FORTEO 600 MCG/2.4ML SOPN INJECT 20 MCG UNDER THE SKIN 1 TIME A DAY. DISCARD PEN 28 DAYS AFTER INITIAL USE (Patient taking differently: Inject 20 mcg into the skin every 28 (twenty-eight) days.)   furosemide (LASIX) 40 MG tablet Take 20-40 mg by mouth daily as needed for fluid.   insulin aspart (NOVOLOG FLEXPEN) 100 UNIT/ML FlexPen Inject 0-10 Units into the skin 3 (three) times daily with meals. Per sliding scale   insulin degludec (TRESIBA FLEXTOUCH) 200 UNIT/ML FlexTouch Pen Inject 32 Units into the skin in the morning.   Insulin Pen Needle 31G X 5 MM MISC Use to inject Forteo once daily (Patient taking differently: Inject 1 Dose into the skin See admin instructions. Use to inject Forteo once daily)   linaclotide (LINZESS) 290 MCG CAPS capsule Take 290 mcg by mouth at bedtime.    metoprolol succinate (TOPROL-XL) 25 MG 24 hr tablet Take 1 tablet (25 mg total) by mouth daily.   nitroGLYCERIN (NITROSTAT) 0.4 MG SL tablet Place 1 tablet (0.4 mg total) under the tongue every 5 (five) minutes as needed for chest pain.   pantoprazole (PROTONIX) 40 MG tablet Take 40 mg by mouth at bedtime.   Teriparatide 600 MCG/2.4ML SOPN Inject 20 mcg into the skin daily.   [DISCONTINUED] acetaminophen (TYLENOL) 500 MG tablet Take 500 mg by mouth daily as needed for mild pain (pain score 1-3).   [DISCONTINUED] Semaglutide,0.25 or 0.5MG /DOS, (OZEMPIC, 0.25 OR 0.5 MG/DOSE,) 2 MG/3ML SOPN Inject 0.5 mg into the skin every Wednesday.   [DISCONTINUED] Sodium Hyaluronate, Viscosup, (EUFLEXXA) 20 MG/2ML SOSY Inject 20 mg into the articular space of Right knee once a week for 3 weeks. (Patient taking differently: 20 mg by Other route See admin instructions. Inject 20 mg into the articular space of Right knee once a week for 3 weeks.)     Allergies:   Celebrex [celecoxib] and Mobic [meloxicam]   Social History   Socioeconomic History   Marital  status: Divorced    Spouse name: Not on file   Number of children: Not on file   Years of education: Not on file   Highest education level: Not on file  Occupational History   Not on file  Tobacco Use   Smoking status:  Every Day    Current packs/day: 1.00    Average packs/day: 1 pack/day for 45.5 years (45.5 ttl pk-yrs)    Types: Cigarettes    Start date: 09/24/1977    Passive exposure: Current   Smokeless tobacco: Never   Tobacco comments:    Pt states 0.5 ppd.   Vaping Use   Vaping status: Never Used  Substance and Sexual Activity   Alcohol use: Yes    Alcohol/week: 6.0 standard drinks of alcohol    Types: 6 Cans of beer per week    Comment: weekends - social   Drug use: No   Sexual activity: Not Currently  Other Topics Concern   Not on file  Social History Narrative   ** Merged History Encounter **       Social Determinants of Health   Financial Resource Strain: Not on file  Food Insecurity: No Food Insecurity (07/20/2020)   [facility] from [facility], [facility]   Hunger Vital Sign    Worried About Running Out of Food in the Last Year: Never true    Ran Out of Food in the Last Year: Never true  Transportation Needs: Not on file  Physical Activity: Not on file  Stress: Not on file  Social Connections: Unknown (09/08/2021)   Received from [facility], [facility]   Social Network    Social Network: Not on file     Family History: The patient's family history includes Cancer in her mother; Diabetes in her father, mother, and son; Healthy in her brother, daughter, and son; Heart disease in her father; Hypertension in her father. ROS:   Please see the history of present illness.    All 14 point review of systems negative except as described per history of present illness  EKGs/Labs/Other Studies Reviewed:    EKG Interpretation Date/Time:  Tuesday March 24 2023 13:15:19 EST Ventricular Rate:  70 PR Interval:  128 QRS Duration:  84 QT  Interval:  424 QTC Calculation: 457 R Axis:   7  Text Interpretation: Normal sinus rhythm T wave abnormality, consider anterior ischemia Abnormal ECG When compared with ECG of 06-May-2021 10:14, Inverted T waves have replaced nonspecific T wave abnormality in Inferior leads T wave inversion now evident in Anterior leads Confirmed by Gypsy Balsam [phone]) on 03/24/2023 1:26:35 PM    Recent Labs: 08/12/2022: ALT 27; BUN 17; Creat 0.77; Hemoglobin 13.8; Platelets 223; Potassium 3.9; Sodium 140  Recent Lipid Panel    Component Value Date/Time   CHOL 186 10/11/2019 0950   TRIG 136 10/11/2019 0950   HDL 49 10/11/2019 0950   CHOLHDL 3.8 10/11/2019 0950   LDLCALC 113 (H) 10/11/2019 0950   LDLDIRECT 31 10/08/2022 0832    Physical Exam:    VS:  BP 120/64 (BP Location: Left Arm, Patient Position: Sitting)   Pulse 70   Ht 5\' 2"  (1.575 m)   Wt 151 lb 3.2 oz (68.6 kg)   LMP 05/04/2008   SpO2 93%   BMI 27.65 kg/m     Wt Readings from Last 3 Encounters:  03/24/23 151 lb 3.2 oz (68.6 kg)  02/12/23 150 lb (68 kg)  08/12/22 149 lb (67.6 kg)     GEN:  Well nourished, well developed in no acute distress HEENT: Normal NECK: No JVD; No carotid bruits LYMPHATICS: No lymphadenopathy CARDIAC: RRR, no murmurs, no rubs, no gallops RESPIRATORY:  Clear to auscultation without rales, wheezing or rhonchi  ABDOMEN: Soft, non-tender, non-distended MUSCULOSKELETAL:  No edema; No deformity  SKIN: Warm and dry LOWER EXTREMITIES: no swelling NEUROLOGIC:  Alert and oriented x 3 PSYCHIATRIC:  Normal affect   ASSESSMENT:    1. Coronary artery disease involving native coronary artery of native heart without angina pectoris   2. Diabetes mellitus without complication (HCC)   3. Dyspnea on exertion    PLAN:    In order of problems listed above:  Coronary disease stable from that point review moderate disease based on cardiac catheterization, denies have any symptoms that would indicate worsening  of the problem.  Will continue present management which include antiplatelets therapy. Diabetes: Followed by primary care physician, last hemoglobin A1c in October 2024 was 7.5.  Interestingly before she got much better hemoglobin A1c when she was taking Ozempic but that is being taken away not being paid by insurance company. Dyslipidemia I did review her last blood test her HDL 74, LDL 19.  Will continue present management. Smoking obviously a big problem we spent some time talking about this strongly advised him to quit.   Medication Adjustments/Labs and Tests Ordered: Current medicines are reviewed at length with the patient today.  Concerns regarding medicines are outlined above.  Orders Placed This Encounter  Procedures   EKG 12-Lead   Medication changes: No orders of the defined types were placed in this encounter.   Signed, Georgeanna Lea, MD, [facility] 03/24/2023 1:39 PM    [facility]

## 2023-04-15 DIAGNOSIS — K8591 Acute pancreatitis with uninfected necrosis, unspecified: Secondary | ICD-10-CM | POA: Insufficient documentation

## 2023-04-15 DIAGNOSIS — Z8719 Personal history of other diseases of the digestive system: Secondary | ICD-10-CM | POA: Insufficient documentation

## 2023-05-19 ENCOUNTER — Telehealth: Payer: Self-pay | Admitting: Rheumatology

## 2023-05-19 NOTE — Telephone Encounter (Signed)
Pt is requesting a call back from the pharmacist about her medication application. Call back is [phone]

## 2023-05-20 ENCOUNTER — Telehealth: Payer: Self-pay | Admitting: Rheumatology

## 2023-05-20 NOTE — Telephone Encounter (Signed)
Pt called back again to talk to the pharmacist about her pt assistance. Pt states she can not afford her medication with out the help of the assistance.

## 2023-05-21 NOTE — Telephone Encounter (Signed)
Called [facility] regarding Forteo. Per rep, there is no copay  for the teriparatide. Rep states the insurance and a savings card were billed.  Order was placed on 05/20/2023 and patient is expected to receive shipment on Saturday, 05/23/2023  Called patient and advised that everything has been resolved   Chesley Mires, PharmD, MPH, BCPS, CPP Clinical Pharmacist (Rheumatology and Pulmonology)

## 2023-05-26 DIAGNOSIS — M533 Sacrococcygeal disorders, not elsewhere classified: Secondary | ICD-10-CM | POA: Insufficient documentation

## 2023-05-29 ENCOUNTER — Other Ambulatory Visit: Payer: Self-pay | Admitting: Physician Assistant

## 2023-05-29 DIAGNOSIS — M810 Age-related osteoporosis without current pathological fracture: Secondary | ICD-10-CM

## 2023-05-29 NOTE — Telephone Encounter (Signed)
Last Fill: 02/20/2023  Labs: 03/10/2023 Glucose 68, Total Protein 5.6, Bilirubin, Total 1.5 RBC 4.07, MCV 98.7  Next Visit: 08/25/2023  Last Visit: 02/12/2023  DX: Age-related osteoporosis without current pathological fracture   Current Dose per office note 02/12/2023: Patient was started on Forteo daily injections on 09/30/21.   Okay to refill Forteo?

## 2023-06-04 ENCOUNTER — Other Ambulatory Visit: Payer: Self-pay | Admitting: Physician Assistant

## 2023-06-04 DIAGNOSIS — M533 Sacrococcygeal disorders, not elsewhere classified: Secondary | ICD-10-CM

## 2023-06-19 ENCOUNTER — Encounter: Payer: Self-pay | Admitting: Cardiology

## 2023-06-24 ENCOUNTER — Other Ambulatory Visit: Payer: Self-pay | Admitting: Cardiology

## 2023-06-24 ENCOUNTER — Ambulatory Visit
Admission: RE | Admit: 2023-06-24 | Discharge: 2023-06-24 | Disposition: A | Discharge status: Home / Self Care | Payer: Self-pay | Source: Ambulatory Visit | Attending: Physician Assistant | Admitting: Physician Assistant

## 2023-06-24 DIAGNOSIS — M533 Sacrococcygeal disorders, not elsewhere classified: Secondary | ICD-10-CM

## 2023-06-24 MED ORDER — IOPAMIDOL (ISOVUE-M 200) INJECTION 41%
1.0000 mL | Freq: Once | INTRAMUSCULAR | Status: AC | PRN
  Administered 2023-06-24: 1 mL via INTRATHECAL

## 2023-07-06 ENCOUNTER — Other Ambulatory Visit: Payer: Self-pay

## 2023-07-06 ENCOUNTER — Ambulatory Visit: Admitting: Physician Assistant

## 2023-07-06 ENCOUNTER — Telehealth: Payer: Self-pay | Admitting: Rheumatology

## 2023-07-06 MED ORDER — METOPROLOL SUCCINATE ER 25 MG PO TB24
25.0000 mg | ORAL_TABLET | Freq: Every day | ORAL | 1 refills | Status: DC
Start: 2023-07-06 — End: 2023-12-21

## 2023-07-06 NOTE — Telephone Encounter (Signed)
 Last cortisone injection performed here: 08/12/2022  Patient states she has had another injection by ortho but it has been longer than 3 months since the last injection. Patient is scheduled for today at 1:50pm for an eval and possible injection.

## 2023-07-06 NOTE — Telephone Encounter (Signed)
 Prescription sent to pharmacy.

## 2023-07-06 NOTE — Telephone Encounter (Signed)
 Pt called requesting a Cortizone shot in her right knee as soon as possible. Pt states she does not know when the last time she got an injection in her knee. Pt would like to know if she could come in today.

## 2023-08-03 ENCOUNTER — Telehealth: Payer: Self-pay | Admitting: Rheumatology

## 2023-08-03 NOTE — Telephone Encounter (Signed)
 Pt called wanting help from the pharmacist on her patient assistance. Pt stated she been without her medication for a couple weeks and would like to start back on it. Pt is requesting a call back on her cell phone. [phone]

## 2023-08-04 ENCOUNTER — Telehealth: Payer: Self-pay | Admitting: Rheumatology

## 2023-08-04 NOTE — Telephone Encounter (Signed)
 Pt called stating the [facility] called, their number being [phone] stating she would have to switch medication back to [facility]. Pt states she is upset she still has not gotten a call back from anyone.

## 2023-08-04 NOTE — Telephone Encounter (Signed)
 Pt called again wanting help from the pharmacist on her patient assistance. Pt stated she been without her medication for a couple weeks and would like to start back on it. Pt is requesting a call back on her cell phone. [phone]

## 2023-08-05 ENCOUNTER — Telehealth: Payer: Self-pay

## 2023-08-05 ENCOUNTER — Other Ambulatory Visit (HOSPITAL_COMMUNITY): Payer: Self-pay

## 2023-08-05 NOTE — Telephone Encounter (Signed)
 Contacted copay card company, she confirms that it is in fact an issue with the quantity attempting to be dispensed. She states that they currently trying to bill the medication as a quantity of 2.4 rather than the 2.24 that is being required by the card.  Called [facility] and spoke with a different rep and explained the situation. She initially attempted to resolve the quantity dispute on her own, however she ultimately had to contact her assist team. The issue could not be resolved directly and she had to get the copay card company on the line, who then also had to reach out to a different department in order to attempt resolution.  After a substantial hold time, it turns out that the issue is actually the fault of the copay card company. It turns out the requirement to bill medication as 2.67mL was actually a change that happened in error and they need to implement a mass correction. They of course have no estimated turnaround time for this to be completed.   Contacted pt and discussed, she verbalized understanding that [facility] is supposedly going to be periodically attempting to reprocess the Rx. Will leave encounter open and await any potential f/u.

## 2023-08-05 NOTE — Telephone Encounter (Addendum)
 Understood, will investigate issues in separate encounter

## 2023-08-05 NOTE — Telephone Encounter (Signed)
 Contacted [facility] for additional information. According to the rep, there are manufacturer issues acquiring the Forteo 622mcg/2.4mL Additional, this rep states the she is currently unable to see the copay card that was used back in January. She transferred me over to the Care Team [phone]) for additional assistance.  Spoke with member of care team who was actually able to confirm that pt has manufacturer copay card on file, however it is currenlty rejecting stating that "plan limitations being exceeded". The limitations in question appear to be an issue with the the quantity attempting to be processed, however the rep assures me that it is being processed the same way that it has been back on 06/13/23 and 05/20/23 which was 3 (each) as a 28 day supply. I mentioned that the prescription appears to have been written as "2.46mL per 28 days" which would imply that only 1 pen was to be dispensed, and this is additionally confirmed by our own Dispense History reports showing that on both of the previous dates the quantity dispensed was 2.4 as a 28 day supply however she remains adamant that it is currently being processed in the same way that it was previously.  She was able to provide me with the number to the copay card company [phone]) so that I could continue my investigation there for additional information, I requested that she adjudicate the Rx so that the copay card company could access a live claim if required.  Pt's copay card ID# is [id number] Live claim Rx# [id number]  Contacted pt and verified that she has in fact only ever received one pen per shipment. She states that she also spoke with the copay card company who has informed her that it appeared that [facility] was incorrectly billing the prescription.

## 2023-08-06 ENCOUNTER — Telehealth: Payer: Self-pay

## 2023-08-06 NOTE — Telephone Encounter (Signed)
Routing to Holly

## 2023-08-06 NOTE — Telephone Encounter (Signed)
 I called and spoke with the pt  She did not mean to call our office  She meant to call her rheumatologist regarding this  She has her rx issue handled now  Nothing needed

## 2023-08-06 NOTE — Telephone Encounter (Signed)
 This Rx was prescribed by Sherron Ales, PA. It looks like she works for [employer].   ATC pt X1. Lmtcb

## 2023-08-06 NOTE — Telephone Encounter (Signed)
 Copied from CRM [id number]. Topic: Clinical - Prescription Issue >> Aug 06, 2023 10:54 AM Konrad Dolores wrote: Reason for CRM:  Patient called back and is requesting a call from [place] regarding her medication Forteo 667mcg/2.4mL due to [facility] stating that her copay is showing $200, however it should be covered by the assistance program. Please call the patient back to discuss options to receive medication at [phone].

## 2023-08-10 NOTE — Progress Notes (Signed)
 Office Visit Note  Patient: [name]             Date of Birth: [birth year]           MRN: [id number]             PCP: Abbe Hoard., MD Referring: Abbe Hoard., MD Visit Date: 08/24/2023 Occupation: @GUAROCC @  Subjective:  Discuss osteoporosis treatment options   History of Present Illness: [name] is a 63 y.o. female with history of osteoporosis and osteoarthritis.  Patient remains on teriparatide  daily injections for management of osteoporosis.  She is tolerating teriparatide  without any side effects or injection site reactions.  Patient states that she recently missed about 3 weeks due to issues receiving the prescription from the pharmacy. She was initially started on Forteo  in June 2023 and has otherwise not had any interactions.  She is not currently taking any calcium or vitamin D  supplement.  She denies any recent falls or fractures.  She is currently undergoing viscosupplementation for the right knee with orthopedics.  She had a recent left knee joint cortisone injection performed.   Activities of Daily Living:  Patient reports morning stiffness for 5 minutes.   Patient Reports nocturnal pain.  Difficulty dressing/grooming: Denies Difficulty climbing stairs: Denies Difficulty getting out of chair: Denies Difficulty using hands for taps, buttons, cutlery, and/or writing: Denies  Review of Systems  Constitutional:  Positive for fatigue.  HENT:  Negative for mouth sores, mouth dryness and nose dryness.   Eyes:  Negative for pain and dryness.  Respiratory:  Negative for shortness of breath and difficulty breathing.   Cardiovascular:  Negative for chest pain and palpitations.  Gastrointestinal:  Negative for blood in stool, constipation and diarrhea.  Endocrine: Negative for increased urination.  Genitourinary:  Negative for involuntary urination.  Musculoskeletal:  Positive for joint pain, joint pain, muscle weakness and morning stiffness. Negative for gait  problem, joint swelling, myalgias, muscle tenderness and myalgias.  Skin:  Negative for color change, rash and sensitivity to sunlight.  Allergic/Immunologic: Positive for susceptible to infections.  Neurological:  Negative for dizziness and headaches.  Hematological:  Negative for swollen glands.  Psychiatric/Behavioral:  Positive for sleep disturbance. Negative for depressed mood. The patient is not nervous/anxious.     PMFS History:  Patient Active Problem List   Diagnosis Date Noted  . Liver cyst 03/24/2023  . Liver hemangioma 03/24/2023  . DOE (dyspnea on exertion) 05/16/2022  . Influenzal pneumonia 05/15/2022  . Osteoarthritis of right knee 12/07/2021  . Dermatographic urticaria 10/18/2021  . S/P lumbar laminectomy 09/18/2021  . Elevated parathyroid hormone 06/06/2021  . Osteoporosis of multiple sites without pathological fracture 06/06/2021  . Accelerating angina (HCC) 05/06/2021  . Complex regional pain syndrome of lower limb 03/07/2021  . Pelvic pain 12/14/2020  . RUQ pain 11/09/2020  . Elevated alkaline phosphatase level 11/09/2020  . Uncomplicated type 1 diabetes mellitus (HCC) 10/22/2020  . Lipoma of left upper extremity 09/04/2020  . Irritable bowel syndrome 09/03/2020  . Idiopathic peripheral neuropathy 08/29/2020  . Lipoma of abdominal wall 08/08/2020  . Fever 07/18/2020  . Hypotension 07/18/2020  . Sepsis (HCC) 07/18/2020  . Pneumonia 07/18/2020  . Arthritis 07/18/2020  . Blurred vision 07/11/2020  . Facial tingling 07/11/2020  . Diabetes mellitus without complication (HCC)   . Acute left ankle pain 03/09/2020  . History of pneumonia 01/25/2020  . History of sepsis 01/25/2020  . Respiratory failure (HCC) 01/13/2020  . HAP (hospital-acquired pneumonia) 01/13/2020  .  Fusion of lumbar spine 01/11/2020  . Trigger thumb of right hand 11/14/2019  . Low back pain 10/22/2019  . Obsessive-compulsive disorder 10/14/2019  . Diabetes mellitus due to underlying  condition with unspecified complications (HCC) 09/15/2019  . Primary osteoarthritis involving multiple joints 08/24/2019  . Alcohol use 04/15/2019  . Elevated LFTs 03/10/2019  . Lumbar post-laminectomy syndrome 03/04/2019  . Acute bilateral thoracic back pain 12/03/2018  . Pain of left hip joint 07/23/2018  . Polyarthralgia 06/11/2018  . Bursitis of both hips 04/14/2018  . Left foot pain 01/28/2018  . CAD (coronary artery disease) 12/24/2017  . Secondary diabetes mellitus (HCC) 12/24/2017  . Coronary arteriosclerosis 12/24/2017  . Dyspnea on exertion 12/04/2017  . Attention deficit 12/04/2017  . Intermittent claudication (HCC) 12/04/2017  . History of metabolic disorder 12/04/2017  . Lung mass 12/04/2017  . Myositis 12/04/2017  . Erythrocytosis 12/04/2017  . Senile purpura (HCC) 12/04/2017  . Chest pain 11/06/2017  . Multiple actinic keratoses 11/06/2017  . Degeneration of lumbar intervertebral disc 11/06/2017  . Fatigue 11/06/2017  . Insulin  long-term use (HCC) 11/06/2017  . Menopausal disorder 11/06/2017  . Purpura (HCC) 11/06/2017  . Moderate chronic obstructive pulmonary disease (HCC) 11/06/2017  . Folic acid  deficiency 11/06/2017  . Malaise and fatigue 11/06/2017  . Arteriosclerotic vascular disease 11/06/2017  . Dysphagia 11/04/2017  . Sciatica 11/03/2017  . Scoliosis deformity of spine 11/03/2017  . Lumbar radiculopathy 05/26/2017  . Personal history of (healed) traumatic fracture 12/31/2016  . Deficiency of other specified B group vitamins 11/07/2016  . Mixed hyperlipidemia 04/09/2016  . Smoking greater than 30 pack years 02/26/2016  . Cigarette smoker 02/26/2016  . Nicotine dependence, cigarettes, uncomplicated 02/26/2016  . Diabetes mellitus type 1, uncomplicated (HCC) 02/11/2016  . Tobacco abuse 02/11/2016  . Tobacco user 02/11/2016  . Pure hypercholesterolemia 02/11/2016  . Atherosclerosis of native coronary artery of native heart without angina pectoris  02/11/2016  . Coronary artery disease of native artery of native heart with stable angina pectoris (HCC) 01/25/2016  . Abnormal thyroid  function test 12/06/2015  . Chronic bronchitis (HCC) 12/04/2015  . Overweight with body mass index (BMI) 25.0-29.9 07/11/2015  . BMI 27.0-27.9,adult 07/11/2015  . Pain in both lower extremities 07/10/2015  . Anxiety disorder 06/05/2015  . Atherosclerosis 06/05/2015  . Hemangioma 06/05/2015  . Primary insomnia 06/05/2015  . Trichotillomania 06/05/2015  . HNP (herniated nucleus pulposus), lumbar 05/10/2014    Past Medical History:  Diagnosis Date  . Abnormal thyroid  function test 12/06/2015  . Accelerating angina (HCC) 05/06/2021  . Acute bilateral thoracic back pain 12/03/2018  . Acute left ankle pain 03/09/2020  . Alcohol use 04/15/2019   Formatting of this note might be different from the original. Recommend abstinence from alcohol  . Anxiety disorder 06/05/2015   Last Assessment & Plan:  Formatting of this note might be different from the original. Had started her on some xanax  about week ago and she feels it has helped her and she is taking about half tablet for this and will continue with it as she has been compliant with it  . Arteriosclerotic vascular disease 11/06/2017  . Arthritis    back   . Atherosclerosis 06/05/2015   Formatting of this note might be different from the original. Seen on CTs of chest.  . Atherosclerosis of native coronary artery of native heart without angina pectoris 02/11/2016  . Attention deficit 12/04/2017  . Blurred vision 07/11/2020  . BMI 27.0-27.9,adult 07/11/2015   Last Assessment & Plan:  Formatting  of this note might be different from the original. Relevant Hx: Course: Daily Update: Today's Plan:difficult for her with the insulin  as well as her abilify and the weight is increaseing for her rather than decreasing, she has cut her alcohol intake back, she has worked on her diet more with decreasing her carb intake as  well and still difficulty discussed with   . Bursitis of both hips 04/14/2018  . CAD (coronary artery disease) 12/24/2017  . Chest pain 11/06/2017  . Chronic bronchitis (HCC) 12/04/2015   Formatting of this note might be different from the original. PFT ratio 88% FEV1 103% FVC 94% DLCO 76%  Last Assessment & Plan:  Formatting of this note might be different from the original. We discussed this as she has been coughing again for about 2 mnths and she sounds congested on exam with previous ease for pneumonia will place her on levaquin and have reviewed with her the importance of her   . Cigarette smoker 02/26/2016  . Complex regional pain syndrome of lower limb 03/07/2021  . Coronary arteriosclerosis 12/24/2017  . Coronary artery disease of native artery of native heart with stable angina pectoris (HCC) 01/25/2016   Formatting of this note might be different from the original. 2017:  50% LAD lesion.  Calcium noted other places.  Heart cath December 31, 2017 with a 30% ostial LAD lesion otherwise normal coronary arteries seen with LVEF 55 to 65%  . Deficiency of other specified B group vitamins 11/07/2016  . Degeneration of lumbar intervertebral disc 11/06/2017  . Diabetes mellitus due to underlying condition with unspecified complications (HCC) 09/15/2019  . Diabetes mellitus type 1, uncomplicated (HCC) 02/11/2016   Added automatically from request for surgery [phone]  Formatting of this note might be different from the original. Added automatically from request for surgery [phone]  . Diabetes mellitus without complication (HCC)    Type 1- injections only- recent change to Toujeo  with improved A1C reading.  Aaron Aas Dysphagia 11/04/2017  . Dyspnea on exertion 12/04/2017  . Elevated alkaline phosphatase level 11/09/2020  . Elevated LFTs 03/10/2019   Formatting of this note might be different from the original. History.  Cannot change to this.  . Elevated parathyroid hormone 06/06/2021  . Erythrocytosis  12/04/2017  . Facial tingling 07/11/2020  . Fatigue 11/06/2017  . Fever 07/18/2020  . Folic acid  deficiency 11/06/2017  . Fusion of lumbar spine 01/11/2020  . HAP (hospital-acquired pneumonia) 01/13/2020  . Hemangioma 11/06/2017  . History of metabolic disorder 12/04/2017  . History of pneumonia 01/25/2020   Formatting of this note might be different from the original. Recent mutifocal pneumonia September 2021 Formatting of this note might be different from the original. Formatting of this note might be different from the original. Recent mutifocal pneumonia September 2021  . History of sepsis 01/25/2020   Formatting of this note might be different from the original. Recent septic syndrome with multifocal pneumonia Formatting of this note might be different from the original. Formatting of this note might be different from the original. Recent septic syndrome with multifocal pneumonia  . HNP (herniated nucleus pulposus), lumbar 05/10/2014  . Hypotension 07/18/2020  . Idiopathic peripheral neuropathy 08/29/2020  . Insulin  long-term use (HCC) 11/06/2017  . Intermittent claudication (HCC) 12/04/2017  . Irritable bowel syndrome 09/03/2020  . Left foot pain 01/28/2018  . Lipoma of abdominal wall 08/08/2020  . Lipoma of left upper extremity 09/04/2020  . Low back pain 10/22/2019  . Lumbar post-laminectomy syndrome 03/04/2019  . Lumbar  radiculopathy 05/26/2017  . Lung mass 12/04/2017  . Malaise and fatigue 11/06/2017  . Menopausal disorder 11/06/2017  . Mixed hyperlipidemia 04/09/2016   Formatting of this note might be different from the original. Added automatically from request for surgery [phone]  . Moderate chronic obstructive pulmonary disease (HCC) 11/06/2017  . Multiple actinic keratoses 11/06/2017  . Myositis 12/04/2017  . Nicotine dependence, cigarettes, uncomplicated 02/26/2016  . Obsessive-compulsive disorder 10/14/2019   Formatting of this note might be different from the  original. Formerly followed with [facility].  . Osteoporosis of multiple sites without pathological fracture 06/06/2021  . Overweight with body mass index (BMI) 25.0-29.9 07/11/2015   Last Assessment & Plan:  Relevant Hx: Course: Daily Update: Today's Plan:difficult for her with the insulin  as well as her abilify and the weight is increaseing for her rather than decreasing, she has cut her alcohol intake back, she has worked on her diet more with decreasing her carb intake as well and still difficulty discussed with the saxenda and will try to get this approved for her and see   . Pain in both lower extremities 07/10/2015   Last Assessment & Plan:  Formatting of this note might be different from the original. Relevant Hx: Course: Daily Update: Today's Plan:she had pain to her right anterior thigh there is edema of both legs and she has fear for DVT which she is going to have US  of and confirm ok, she has increased her weight which is part of her edema I believe as well as her insulin  usage. She is advised as well tha  . Pain of left hip joint 07/23/2018  . Pelvic pain 12/14/2020  . Personal history of (healed) traumatic fracture 12/31/2016  . Pneumonia   . Polyarthralgia 06/11/2018  . Primary insomnia 06/05/2015  . Primary osteoarthritis involving multiple joints 08/24/2019  . Pure hypercholesterolemia 02/11/2016  . Purpura (HCC) 11/06/2017  . Respiratory failure (HCC) 11/2018  . RUQ pain 11/09/2020  . S/P lumbar laminectomy 09/18/2021  . Sciatica 11/03/2017  . Scoliosis deformity of spine 11/03/2017  . Secondary diabetes mellitus (HCC) 12/24/2017  . Senile purpura (HCC) 12/04/2017  . Sepsis (HCC) 07/18/2020  . Smoking greater than 30 pack years 02/26/2016  . Tobacco abuse 02/11/2016  . Tobacco user 02/11/2016  . Trichotillomania    hx.   . Trigger thumb of right hand 11/14/2019  . Uncomplicated type 1 diabetes mellitus (HCC) 10/22/2020    Family History  Problem Relation Age of Onset   . Diabetes Mother   . Cancer Mother   . Hypertension Father   . Heart disease Father   . Diabetes Father   . Healthy Brother   . Healthy Son   . Diabetes Son   . Healthy Daughter    Past Surgical History:  Procedure Laterality Date  . ABDOMINAL EXPOSURE N/A 01/11/2020   Procedure: ABDOMINAL EXPOSURE;  Surgeon: Young Hensen, MD;  Location: [facility] OR;  Service: Vascular;  Laterality: N/A;  . ANTERIOR LUMBAR FUSION N/A 01/11/2020   Procedure: ANTERIOR LUMBAR INTERBODY FUSION  LUMBAR FIVE-SACRAL ONE, LEFT LUMBAR TWO-THREE FORAMINOTOMY, DISECTOMY;  Surgeon: Mort Ards, MD;  Location: [facility] OR;  Service: Orthopedics;  Laterality: N/A;  . APPENDECTOMY  04/28/1970  . BACK SURGERY  2016  . CARDIAC CATHETERIZATION  12/31/2017  . COLONOSCOPY  04/28/2017  . CORONARY PRESSURE/FFR STUDY N/A 05/06/2021   Procedure: INTRAVASCULAR PRESSURE WIRE/FFR STUDY;  Surgeon: Sammy Crisp, MD;  Location: [facility] INVASIVE CV LAB;  Service: Cardiovascular;  Laterality: N/A;  .  EXCISION/RELEASE BURSA HIP Left 04/14/2018   Procedure: Excision trochanteric bursa left hip;  Surgeon: Orvan Blanch, MD;  Location: [facility] ORS;  Service: Orthopedics;  Laterality: Left;  60 mins  . HAND SURGERY    . HIP OPEN REDUCTION Right    ORIF  . KNEE ARTHROSCOPY    . LEFT HEART CATH AND CORONARY ANGIOGRAPHY N/A 12/31/2017   Procedure: LEFT HEART CATH AND CORONARY ANGIOGRAPHY;  Surgeon: Swazil[place], Peter M, MD;  Location: [facility] INVASIVE CV LAB;  Service: Cardiovascular;  Laterality: N/A;  . LEFT HEART CATH AND CORONARY ANGIOGRAPHY N/A 05/06/2021   Procedure: LEFT HEART CATH AND CORONARY ANGIOGRAPHY;  Surgeon: Sammy Crisp, MD;  Location: [facility] INVASIVE CV LAB;  Service: Cardiovascular;  Laterality: N/A;  . LUMBAR LAMINECTOMY/DECOMPRESSION MICRODISCECTOMY Left 05/10/2014   Procedure: MICRODISCECTOMY LUMBAR DECOMPRESSION L5-S1 LEFT ;  Surgeon: Loel Ring, MD;  Location: [facility] ORS;  Service: Orthopedics;  Laterality: Left;  . LUMBAR  LAMINECTOMY/DECOMPRESSION MICRODISCECTOMY N/A 01/11/2020   Procedure: LUMBAR LAMINECTOMY/DECOMPRESSION MICRODISCECTOMY LEFT LUMBAR TWO-THREE;  Surgeon: Mort Ards, MD;  Location: [facility] OR;  Service: Orthopedics;  Laterality: N/A;  . LUMBAR LAMINECTOMY/DECOMPRESSION MICRODISCECTOMY Right 09/18/2021   Procedure: Laminectomy and Foraminotomy - Lumbar Four-Lumbar Five - right;  Surgeon: Isadora Mar, MD;  Location: [facility] OR;  Service: Neurosurgery;  Laterality: Right;  Laminectomy and Foraminotomy - Lumbar Four-Lumbar Five - right  . ORIF WRIST FRACTURE Right    retained hardware  . ORTHOPAEDIC SURGERY  04/14/2018  . TUBAL LIGATION  12/25/1995   Social History   Social History Narrative   ** Merged History Encounter **       Immunization History  Administered Date(s) Administered  . Influenza,inj,Quad PF,6+ Mos 01/21/2016, 12/31/2016, 02/09/2018, 01/18/2021  . Influenza,inj,quad, With Preservative 12/27/2016  . Influenza-Unspecified 01/21/2016, 12/31/2016  . Moderna Sars-Covid-2 Vaccination 04/29/2019, 05/30/2019  . Pneumococcal Conjugate-13 07/23/2017  . Pneumococcal Polysaccharide-23 02/01/2021  . Unspecified SARS-COV-2 Vaccination 02/28/2020     Objective: Vital Signs: BP 108/69 (BP Location: Left Arm, Patient Position: Sitting, Cuff Size: Normal)   Pulse 64   Resp 14   Ht 5\' 2"  (1.575 m)   Wt 150 lb (68 kg) Comment: per patient, patient refused to weigh  LMP 05/04/2008   BMI 27.44 kg/m    Physical Exam Vitals and nursing note reviewed.  Constitutional:      Appearance: She is well-developed.  HENT:     Head: Normocephalic and atraumatic.  Eyes:     Conjunctiva/sclera: Conjunctivae normal.  Cardiovascular:     Rate and Rhythm: Normal rate and regular rhythm.     Heart sounds: Normal heart sounds.  Pulmonary:     Effort: Pulmonary effort is normal.     Breath sounds: Normal breath sounds.  Abdominal:     General: Bowel sounds are normal.     Palpations: Abdomen is  soft.  Musculoskeletal:     Cervical back: Normal range of motion.  Lymphadenopathy:     Cervical: No cervical adenopathy.  Skin:    General: Skin is warm and dry.     Capillary Refill: Capillary refill takes less than 2 seconds.  Neurological:     Mental Status: She is alert and oriented to person, place, and time.  Psychiatric:        Behavior: Behavior normal.     Musculoskeletal Exam: C-spine has good range of motion.  Shoulder joints, elbow joints, wrist joints and MCPs and PIPs, DIPs have good range of motion with no synovitis.  CMC, PIP, DIP  thickening consistent with osteoarthritis of both hands but no active inflammation was noted.  Hip joints have good range of motion with no groin pain.  Knee joints have good range of motion with some discomfort with range of motion of both knees.  Mild crepitus noted.  Ankle joints have good range of motion with no tenderness or joint swelling.  CDAI Exam: CDAI Score: -- Patient Global: --; Provider Global: -- Swollen: --; Tender: -- Joint Exam 08/24/2023   No joint exam has been documented for this visit   There is currently no information documented on the homunculus. Go to the Rheumatology activity and complete the homunculus joint exam.  Investigation: No additional findings.  Imaging: No results found.  Recent Labs: Lab Results  Component Value Date   WBC 6.3 08/12/2022   HGB 13.8 08/12/2022   PLT 223 08/12/2022   NA 140 08/12/2022   K 3.9 08/12/2022   CL 99 08/12/2022   CO2 30 08/12/2022   GLUCOSE 101 (H) 08/12/2022   BUN 17 08/12/2022   CREATININE 0.77 08/12/2022   BILITOT 0.9 08/12/2022   ALKPHOS 109 10/22/2021   AST 29 08/12/2022   ALT 27 08/12/2022   PROT 6.2 08/12/2022   ALBUMIN  4.1 10/22/2021   CALCIUM 10.1 08/12/2022   GFRAA >60 01/21/2020    Speciality Comments: Avoid oral bisphosphonates-history of GER Forteo  started September 26, 2021  Procedures:  No procedures performed Allergies: Celebrex  [celecoxib] and Mobic [meloxicam]    Assessment / Plan:     Visit Diagnoses: Age-related osteoporosis without current pathological fracture:  DXA 03/20/21: BMD LFN 0.672 with T-score -2.6.  Patient was started on Forteo  daily injections on 09/30/21--she recently had a 3-week interruption due to issues with insurance but has otherwise remained on teriparatide  without interruption.  She has been tolerating Her diet without any side effects or injection site reactions.  She has not had any recent falls or fractures.  She is not currently taking a calcium or vitamin D  supplement.  She will be completing the course of teriparatide  the end of June 2025.  Different treatment options were discussed today to transition the patient to.  Discussed indications, contraindications, and potential side effects of IV Reclast today in detail.  All questions were addressed.  Plan to apply for IV Reclast through her insurance--okay to initiate IV Reclast on or after July 2025. Plan to update DEXA ASAP-patient will have either her PCP or gynecologist order an updated bone density at the breast center.  We will discuss results with her once completed. She will require updated lab work including CMP and vitamin D  prior to initiating IV Reclast.  Future orders were placed today to be drawn at the end of June 2025.  She will follow up in 6 months or sooner if needed.   Medication monitoring encounter - She will complete teriparatide  daily injections at the end of June 2025 and then will transition to IV Reclast.   No upcoming dental work scheduled.  CBC and CMP updated on 03/10/23. Plan to check CMP and vitamin D  prior to initiating IV reclast. Plan: Comprehensive metabolic panel with GFR, VITAMIN D  25 Hydroxy (Vit-D Deficiency, Fractures)  Vitamin D  deficiency -Future order for vitamin D  placed today. Plan: VITAMIN D  25 Hydroxy (Vit-D Deficiency, Fractures)  Primary osteoarthritis of right knee: Under care of [facility]--currently undergoing visco-supplementation.   Primary osteoarthritis of left knee: She had a recent cortisone injection performed at [facility] which provided significant relief.  She  has some discomfort with range of motion and crepitus noted.  No effusion noted.  Chronic pain of both shoulders: Patient has good range of motion of both shoulder joints on examination today.  Primary osteoarthritis of both hands: CMC, PIP, DIP thickening consistent with osteoarthritis of both hands.  No synovitis noted.  Degeneration of intervertebral disc of lumbar region, unspecified whether pain present - Status post discectomy L2-L3 and L4-L5.  L4-L5 laminectomy and microdiscectomy on Sep 18, 2021 by Dr. Rochelle Chu.  Fusion of lumbar spine - Status post L5-S1 fusion September 2021 by Dr. Vaughn Georges.  Sep 18, 2021 L4-L5 core decompression by Dr. Waymond Hailey.  Other medical conditions are listed as follows:   Coronary artery disease of native artery of native heart with stable angina pectoris (HCC)  Pure hypercholesterolemia  Atherosclerosis  Diabetes mellitus type 1, uncomplicated (HCC)  History of gastroesophageal reflux (GERD)  History of sepsis  Moderate chronic obstructive pulmonary disease (HCC)  Trichotillomania  Nicotine dependence, cigarettes, uncomplicated  History of anxiety  Multiple actinic keratoses  Orders: Orders Placed This Encounter  Procedures  . Comprehensive metabolic panel with GFR  . VITAMIN D  25 Hydroxy (Vit-D Deficiency, Fractures)   No orders of the defined types were placed in this encounter.    Follow-Up Instructions: Return in about 6 months (around 02/23/2024) for Osteoarthritis, Osteoporosis.   Romayne Clubs, PA-C  Note - This record has been created using Dragon software.  Chart creation errors have been sought, but may not always  have been located. Such creation errors do not reflect on  the standard of medical care.

## 2023-08-24 ENCOUNTER — Ambulatory Visit: Payer: Self-pay | Attending: Physician Assistant | Admitting: Physician Assistant

## 2023-08-24 ENCOUNTER — Encounter: Payer: Self-pay | Admitting: Physician Assistant

## 2023-08-24 VITALS — BP 108/69 | HR 64 | Resp 14 | Ht 62.0 in | Wt 150.0 lb

## 2023-08-24 DIAGNOSIS — M65311 Trigger thumb, right thumb: Secondary | ICD-10-CM

## 2023-08-24 DIAGNOSIS — M4326 Fusion of spine, lumbar region: Secondary | ICD-10-CM

## 2023-08-24 DIAGNOSIS — F633 Trichotillomania: Secondary | ICD-10-CM

## 2023-08-24 DIAGNOSIS — M25511 Pain in right shoulder: Secondary | ICD-10-CM

## 2023-08-24 DIAGNOSIS — Z8619 Personal history of other infectious and parasitic diseases: Secondary | ICD-10-CM

## 2023-08-24 DIAGNOSIS — Z8719 Personal history of other diseases of the digestive system: Secondary | ICD-10-CM

## 2023-08-24 DIAGNOSIS — Z5181 Encounter for therapeutic drug level monitoring: Secondary | ICD-10-CM

## 2023-08-24 DIAGNOSIS — M25561 Pain in right knee: Secondary | ICD-10-CM

## 2023-08-24 DIAGNOSIS — G8929 Other chronic pain: Secondary | ICD-10-CM

## 2023-08-24 DIAGNOSIS — M19042 Primary osteoarthritis, left hand: Secondary | ICD-10-CM

## 2023-08-24 DIAGNOSIS — M1711 Unilateral primary osteoarthritis, right knee: Secondary | ICD-10-CM | POA: Diagnosis not present

## 2023-08-24 DIAGNOSIS — L570 Actinic keratosis: Secondary | ICD-10-CM

## 2023-08-24 DIAGNOSIS — M1712 Unilateral primary osteoarthritis, left knee: Secondary | ICD-10-CM

## 2023-08-24 DIAGNOSIS — I7090 Unspecified atherosclerosis: Secondary | ICD-10-CM

## 2023-08-24 DIAGNOSIS — M65312 Trigger thumb, left thumb: Secondary | ICD-10-CM

## 2023-08-24 DIAGNOSIS — Z8659 Personal history of other mental and behavioral disorders: Secondary | ICD-10-CM

## 2023-08-24 DIAGNOSIS — F17210 Nicotine dependence, cigarettes, uncomplicated: Secondary | ICD-10-CM

## 2023-08-24 DIAGNOSIS — M25512 Pain in left shoulder: Secondary | ICD-10-CM

## 2023-08-24 DIAGNOSIS — I25118 Atherosclerotic heart disease of native coronary artery with other forms of angina pectoris: Secondary | ICD-10-CM

## 2023-08-24 DIAGNOSIS — J449 Chronic obstructive pulmonary disease, unspecified: Secondary | ICD-10-CM

## 2023-08-24 DIAGNOSIS — E109 Type 1 diabetes mellitus without complications: Secondary | ICD-10-CM

## 2023-08-24 DIAGNOSIS — M810 Age-related osteoporosis without current pathological fracture: Secondary | ICD-10-CM | POA: Diagnosis not present

## 2023-08-24 DIAGNOSIS — M51369 Other intervertebral disc degeneration, lumbar region without mention of lumbar back pain or lower extremity pain: Secondary | ICD-10-CM

## 2023-08-24 DIAGNOSIS — E559 Vitamin D deficiency, unspecified: Secondary | ICD-10-CM

## 2023-08-24 DIAGNOSIS — M19041 Primary osteoarthritis, right hand: Secondary | ICD-10-CM

## 2023-08-24 DIAGNOSIS — E7800 Pure hypercholesterolemia, unspecified: Secondary | ICD-10-CM

## 2023-08-25 ENCOUNTER — Ambulatory Visit: Payer: BC Managed Care – PPO | Admitting: Rheumatology

## 2023-09-03 NOTE — Telephone Encounter (Signed)
 Closing encounter for now until we receive communication from patient. MyChart message sent ot patient to let us  know if any further issues

## 2023-10-09 ENCOUNTER — Ambulatory Visit: Attending: Cardiology | Admitting: Cardiology

## 2023-10-09 ENCOUNTER — Encounter: Payer: Self-pay | Admitting: Cardiology

## 2023-10-09 VITALS — BP 112/64 | HR 62 | Ht 62.0 in | Wt 153.8 lb

## 2023-10-09 DIAGNOSIS — I2510 Atherosclerotic heart disease of native coronary artery without angina pectoris: Secondary | ICD-10-CM

## 2023-10-09 DIAGNOSIS — J449 Chronic obstructive pulmonary disease, unspecified: Secondary | ICD-10-CM

## 2023-10-09 DIAGNOSIS — E088 Diabetes mellitus due to underlying condition with unspecified complications: Secondary | ICD-10-CM

## 2023-10-09 NOTE — Patient Instructions (Addendum)
 Medication Instructions:  Your physician recommends that you continue on your current medications as directed. Please refer to the Current Medication list given to you today.  *If you need a refill on your cardiac medications before your next appointment, please call your pharmacy*   Lab Work: CMP, TSH, CBC, Lipid- today If you have labs (blood work) drawn today and your tests are completely normal, you will receive your results only by: MyChart Message (if you have MyChart) OR A paper copy in the mail If you have any lab test that is abnormal or we need to change your treatment, we will call you to review the results.   Testing/Procedures: None Ordered   Follow-Up: At [facility], you and your health needs are our priority.  As part of our continuing mission to provide you with exceptional heart care, we have created designated Provider Care Teams.  These Care Teams include your primary Cardiologist (physician) and Advanced Practice Providers (APPs -  Physician Assistants and Nurse Practitioners) who all work together to provide you with the care you need, when you need it.  We recommend signing up for the patient portal called MyChart.  Sign up information is provided on this After Visit Summary.  MyChart is used to connect with patients for Virtual Visits (Telemedicine).  Patients are able to view lab/test results, encounter notes, upcoming appointments, etc.  Non-urgent messages can be sent to your provider as well.   To learn more about what you can do with MyChart, go to [email or web address].    Your next appointment:   6 month(s)  The format for your next appointment:   In Person  Provider:   Ralene Burger, MD    Other Instructions NA

## 2023-10-09 NOTE — Addendum Note (Signed)
 Addended by: Shawnee Dellen D on: 10/09/2023 10:07 AM   Modules accepted: Orders

## 2023-10-09 NOTE — Progress Notes (Signed)
 Cardiology Office Note:    Date:  10/09/2023   ID:  [name], DOB May 12, 1960, MRN [id number]  PCP:  Despina Floro, NP  Cardiologist:  Ralene Burger, MD    Referring MD: Abbe Hoard., MD   Chief Complaint  Patient presents with   Follow-up    History of Present Illness:    [name] is a 63 y.o. female past medical history significant for coronary artery disease in 2019 she had cardiac catheterization which showed 30% stenosed LAD otherwise anatomy was normal in 2021 she had coronary CT angio which showed calcium score 253 moderate stenosis 50 to 69% proximal LAD.  At the beginning of 2023 per Any chest pain cardiac catheterization has been done which showed ostial/proximal LAD 40% stenosis hemodynamically insignificant with fractional flow reserve 0.91, she also myocardial bridging LAD, no significant disease in circumflex RCA.  Additional problem include hypertension diabetes smoking and also alcohol drinking.  Comes today to my office.  Cardiac wise doing well.  Denies any chest pain tightness squeezing pressure burning chest she was in the hospital for 8 days because of pneumonia and respiratory failure.  However she said that she was told by the pulmonologist she does not have COPD  Past Medical History:  Diagnosis Date   Abnormal thyroid  function test 12/06/2015   Accelerating angina (HCC) 05/06/2021   Acute bilateral thoracic back pain 12/03/2018   Acute left ankle pain 03/09/2020   Alcohol use 04/15/2019   Formatting of this note might be different from the original. Recommend abstinence from alcohol   Anxiety disorder 06/05/2015   Last Assessment & Plan:  Formatting of this note might be different from the original. Had started her on some xanax  about week ago and she feels it has helped her and she is taking about half tablet for this and will continue with it as she has been compliant with it   Arteriosclerotic vascular disease 11/06/2017    Arthritis    back    Atherosclerosis 06/05/2015   Formatting of this note might be different from the original. Seen on CTs of chest.   Atherosclerosis of native coronary artery of native heart without angina pectoris 02/11/2016   Attention deficit 12/04/2017   Blurred vision 07/11/2020   BMI 27.0-27.9,adult 07/11/2015   Last Assessment & Plan:  Formatting of this note might be different from the original. Relevant Hx: Course: Daily Update: Today's Plan:difficult for her with the insulin  as well as her abilify and the weight is increaseing for her rather than decreasing, she has cut her alcohol intake back, she has worked on her diet more with decreasing her carb intake as well and still difficulty discussed with    Bursitis of both hips 04/14/2018   CAD (coronary artery disease) 12/24/2017   Chest pain 11/06/2017   Chronic bronchitis (HCC) 12/04/2015   Formatting of this note might be different from the original. PFT ratio 88% FEV1 103% FVC 94% DLCO 76%  Last Assessment & Plan:  Formatting of this note might be different from the original. We discussed this as she has been coughing again for about 2 mnths and she sounds congested on exam with previous ease for pneumonia will place her on levaquin and have reviewed with her the importance of her    Cigarette smoker 02/26/2016   Complex regional pain syndrome of lower limb 03/07/2021   Coronary arteriosclerosis 12/24/2017   Coronary artery disease of native artery of native heart with stable angina  pectoris (HCC) 01/25/2016   Formatting of this note might be different from the original. 2017:  50% LAD lesion.  Calcium noted other places.  Heart cath December 31, 2017 with a 30% ostial LAD lesion otherwise normal coronary arteries seen with LVEF 55 to 65%   Deficiency of other specified B group vitamins 11/07/2016   Degeneration of lumbar intervertebral disc 11/06/2017   Diabetes mellitus due to underlying condition with unspecified complications  (HCC) 09/15/2019   Diabetes mellitus type 1, uncomplicated (HCC) 02/11/2016   Added automatically from request for surgery [id number]  Formatting of this note might be different from the original. Added automatically from request for surgery [id number]   Diabetes mellitus without complication (HCC)    Type 1- injections only- recent change to Toujeo  with improved A1C reading.   Dysphagia 11/04/2017   Dyspnea on exertion 12/04/2017   Elevated alkaline phosphatase level 11/09/2020   Elevated LFTs 03/10/2019   Formatting of this note might be different from the original. History.  Cannot change to this.   Elevated parathyroid hormone 06/06/2021   Erythrocytosis 12/04/2017   Facial tingling 07/11/2020   Fatigue 11/06/2017   Fever 07/18/2020   Folic acid  deficiency 11/06/2017   Fusion of lumbar spine 01/11/2020   HAP (hospital-acquired pneumonia) 01/13/2020   Hemangioma 11/06/2017   History of metabolic disorder 12/04/2017   History of pneumonia 01/25/2020   Formatting of this note might be different from the original. Recent mutifocal pneumonia September 2021 Formatting of this note might be different from the original. Formatting of this note might be different from the original. Recent mutifocal pneumonia September 2021   History of sepsis 01/25/2020   Formatting of this note might be different from the original. Recent septic syndrome with multifocal pneumonia Formatting of this note might be different from the original. Formatting of this note might be different from the original. Recent septic syndrome with multifocal pneumonia   HNP (herniated nucleus pulposus), lumbar 05/10/2014   Hypotension 07/18/2020   Idiopathic peripheral neuropathy 08/29/2020   Insulin  long-term use (HCC) 11/06/2017   Intermittent claudication (HCC) 12/04/2017   Irritable bowel syndrome 09/03/2020   Left foot pain 01/28/2018   Lipoma of abdominal wall 08/08/2020   Lipoma of left upper extremity 09/04/2020   Low back  pain 10/22/2019   Lumbar post-laminectomy syndrome 03/04/2019   Lumbar radiculopathy 05/26/2017   Lung mass 12/04/2017   Malaise and fatigue 11/06/2017   Menopausal disorder 11/06/2017   Mixed hyperlipidemia 04/09/2016   Formatting of this note might be different from the original. Added automatically from request for surgery [id number]   Moderate chronic obstructive pulmonary disease (HCC) 11/06/2017   Multiple actinic keratoses 11/06/2017   Myositis 12/04/2017   Nicotine dependence, cigarettes, uncomplicated 02/26/2016   Obsessive-compulsive disorder 10/14/2019   Formatting of this note might be different from the original. Formerly followed with [facility].   Osteoporosis of multiple sites without pathological fracture 06/06/2021   Overweight with body mass index (BMI) 25.0-29.9 07/11/2015   Last Assessment & Plan:  Relevant Hx: Course: Daily Update: Today's Plan:difficult for her with the insulin  as well as her abilify and the weight is increaseing for her rather than decreasing, she has cut her alcohol intake back, she has worked on her diet more with decreasing her carb intake as well and still difficulty discussed with the saxenda and will try to get this approved for her and see    Pain in both lower extremities 07/10/2015   Last Assessment & Plan:  Formatting of this note might be different from the original. Relevant Hx: Course: Daily Update: Today's Plan:she had pain to her right anterior thigh there is edema of both legs and she has fear for DVT which she is going to have US  of and confirm ok, she has increased her weight which is part of her edema I believe as well as her insulin  usage. She is advised as well tha   Pain of left hip joint 07/23/2018   Pelvic pain 12/14/2020   Personal history of (healed) traumatic fracture 12/31/2016   Pneumonia    Polyarthralgia 06/11/2018   Primary insomnia 06/05/2015   Primary osteoarthritis involving multiple joints 08/24/2019   Pure  hypercholesterolemia 02/11/2016   Purpura (HCC) 11/06/2017   Respiratory failure (HCC) 11/2018   RUQ pain 11/09/2020   S/P lumbar laminectomy 09/18/2021   Sciatica 11/03/2017   Scoliosis deformity of spine 11/03/2017   Secondary diabetes mellitus (HCC) 12/24/2017   Senile purpura (HCC) 12/04/2017   Sepsis (HCC) 07/18/2020   Smoking greater than 30 pack years 02/26/2016   Tobacco abuse 02/11/2016   Tobacco user 02/11/2016   Trichotillomania    hx.    Trigger thumb of right hand 11/14/2019   Uncomplicated type 1 diabetes mellitus (HCC) 10/22/2020    Past Surgical History:  Procedure Laterality Date   ABDOMINAL EXPOSURE N/A 01/11/2020   Procedure: ABDOMINAL EXPOSURE;  Surgeon: Young Hensen, MD;  Location: [facility] OR;  Service: Vascular;  Laterality: N/A;   ANTERIOR LUMBAR FUSION N/A 01/11/2020   Procedure: ANTERIOR LUMBAR INTERBODY FUSION  LUMBAR FIVE-SACRAL ONE, LEFT LUMBAR TWO-THREE FORAMINOTOMY, DISECTOMY;  Surgeon: Mort Ards, MD;  Location: [facility] OR;  Service: Orthopedics;  Laterality: N/A;   APPENDECTOMY  04/28/1970   BACK SURGERY  2016   CARDIAC CATHETERIZATION  12/31/2017   COLONOSCOPY  04/28/2017   CORONARY PRESSURE/FFR STUDY N/A 05/06/2021   Procedure: INTRAVASCULAR PRESSURE WIRE/FFR STUDY;  Surgeon: Sammy Crisp, MD;  Location: [facility] INVASIVE CV LAB;  Service: Cardiovascular;  Laterality: N/A;   EXCISION/RELEASE BURSA HIP Left 04/14/2018   Procedure: Excision trochanteric bursa left hip;  Surgeon: Orvan Blanch, MD;  Location: [facility] ORS;  Service: Orthopedics;  Laterality: Left;  60 mins   HAND SURGERY     HIP OPEN REDUCTION Right    ORIF   KNEE ARTHROSCOPY     LEFT HEART CATH AND CORONARY ANGIOGRAPHY N/A 12/31/2017   Procedure: LEFT HEART CATH AND CORONARY ANGIOGRAPHY;  Surgeon: Swaziland, Peter M, MD;  Location: [facility] INVASIVE CV LAB;  Service: Cardiovascular;  Laterality: N/A;   LEFT HEART CATH AND CORONARY ANGIOGRAPHY N/A 05/06/2021   Procedure: LEFT HEART CATH AND CORONARY  ANGIOGRAPHY;  Surgeon: Sammy Crisp, MD;  Location: [facility] INVASIVE CV LAB;  Service: Cardiovascular;  Laterality: N/A;   LUMBAR LAMINECTOMY/DECOMPRESSION MICRODISCECTOMY Left 05/10/2014   Procedure: MICRODISCECTOMY LUMBAR DECOMPRESSION L5-S1 LEFT ;  Surgeon: Loel Ring, MD;  Location: [facility] ORS;  Service: Orthopedics;  Laterality: Left;   LUMBAR LAMINECTOMY/DECOMPRESSION MICRODISCECTOMY N/A 01/11/2020   Procedure: LUMBAR LAMINECTOMY/DECOMPRESSION MICRODISCECTOMY LEFT LUMBAR TWO-THREE;  Surgeon: Mort Ards, MD;  Location: [facility] OR;  Service: Orthopedics;  Laterality: N/A;   LUMBAR LAMINECTOMY/DECOMPRESSION MICRODISCECTOMY Right 09/18/2021   Procedure: Laminectomy and Foraminotomy - Lumbar Four-Lumbar Five - right;  Surgeon: Isadora Mar, MD;  Location[facility] OR;  Service: Neurosurgery;  Laterality: Right;  Laminectomy and Foraminotomy - Lumbar Four-Lumbar Five - right   ORIF WRIST FRACTURE Right    retained hardware   ORTHOPAEDIC SURGERY  04/14/2018  TUBAL LIGATION  12/25/1995    Current Medications: Current Meds  Medication Sig   ALPRAZolam  (XANAX ) 0.5 MG tablet Take 0.5 mg by mouth at bedtime.    aspirin  EC 81 MG tablet Take 1 tablet (81 mg total) by mouth daily. Swallow whole.   atorvastatin (LIPITOR) 80 MG tablet Take 80 mg by mouth daily.   Continuous Glucose Sensor (DEXCOM G6 SENSOR) MISC Apply topically.   Continuous Glucose Transmitter (DEXCOM G6 TRANSMITTER) MISC as directed.   cyanocobalamin (,VITAMIN B-12,) 1000 MCG/ML injection Inject 1,000 mcg into the muscle every 14 (fourteen) days.   Evolocumab  (REPATHA  SURECLICK) 140 MG/ML SOAJ Inject 140 mg into the skin every 14 (fourteen) days.   finasteride (PROSCAR) 5 MG tablet Take 2.5 mg by mouth every evening.   fluorouracil (EFUDEX) 5 % cream APPLY TO THE AFFECTED AREA TWO (2) TIMES DAILY FOR 1-2 WEEKS UNTIL INFLAMMED, AS DIRECTED   FLUoxetine  (PROZAC ) 40 MG capsule Take 80 mg by mouth every evening.   folic acid  (FOLVITE ) 1 MG  tablet Take 1 mg by mouth at bedtime.    FORTEO  600 MCG/2.4ML SOPN INJECT 20 MCG UNDER THE SKIN 1 TIME A DAY. DISCARD PEN 28 DAYS AFTER INITIAL USE   furosemide  (LASIX ) 40 MG tablet Take 20-40 mg by mouth daily as needed for fluid.   insulin  aspart (NOVOLOG  FLEXPEN) 100 UNIT/ML FlexPen Inject 0-10 Units into the skin 3 (three) times daily with meals. Per sliding scale   insulin  degludec (TRESIBA  FLEXTOUCH) 200 UNIT/ML FlexTouch Pen Inject 32 Units into the skin in the morning.   Insulin  Pen Needle 31G X 5 MM MISC Use to inject Forteo  once daily (Patient taking differently: Inject 1 Dose into the skin See admin instructions. Use to inject Forteo  once daily)   linaclotide  (LINZESS ) 290 MCG CAPS capsule Take 290 mcg by mouth at bedtime.    metoprolol  succinate (TOPROL -XL) 25 MG 24 hr tablet Take 1 tablet (25 mg total) by mouth daily.   nitroGLYCERIN  (NITROSTAT ) 0.4 MG SL tablet Place 1 tablet (0.4 mg total) under the tongue every 5 (five) minutes as needed for chest pain.   pantoprazole  (PROTONIX ) 40 MG tablet Take 40 mg by mouth at bedtime.   Teriparatide  600 MCG/2.4ML SOPN INJECT 20 MCG UNDER THE SKIN 1 TIME A DAY. DISCARD PEN 28 DAYS AFTER INITIAL USE   triamcinolone  cream (KENALOG ) 0.1 % APPLY TO THE AFFECTED AREA(S) TWO (2) TIMES DAILY FOR 5-7 DAYS, THEN APPLY 2-3 TIMES WEEKLY AS NEEDED THEREAFTER     Allergies:   Celebrex [celecoxib] and Mobic [meloxicam]   Social History   Socioeconomic History   Marital status: Divorced    Spouse name: Not on file   Number of children: Not on file   Years of education: Not on file   Highest education level: Not on file  Occupational History   Not on file  Tobacco Use   Smoking status: Every Day    Current packs/day: 1.00    Average packs/day: 1 pack/day for 46.0 years (46.0 ttl pk-yrs)    Types: Cigarettes    Start date: 09/24/1977    Passive exposure: Current   Smokeless tobacco: Never   Tobacco comments:    Pt states 0.5 ppd.   Vaping Use    Vaping status: Never Used  Substance and Sexual Activity   Alcohol use: Yes    Comment: couple of shots daily   Drug use: No   Sexual activity: Not Currently  Other Topics Concern  Not on file  Social History Narrative   ** Merged History Encounter **       Social Drivers of Corporate investment banker Strain: Not on file  Food Insecurity: Low Risk  (05/21/2023)   Received from [facility]   Hunger Vital Sign    Within the past 12 months, you worried that your food would run out before you got money to buy more: Never true    Within the past 12 months, the food you bought just didn't last and you didn't have money to get more. : Never true  Transportation Needs: No Transportation Needs (05/21/2023)   Received from [facility]    In the past 12 months, has lack of reliable transportation kept you from medical appointments, meetings, work or from getting things needed for daily living? : No  Physical Activity: Not on file  Stress: Not on file  Social Connections: Unknown (09/08/2021)   Received from [facility]   Social Network    Social Network: Not on file     Family History: The patient's family history includes Cancer in her mother; Diabetes in her father, mother, and son; Healthy in her brother, daughter, and son; Heart disease in her father; Hypertension in her father. ROS:   Please see the history of present illness.    All 14 point review of systems negative except as described per history of present illness  EKGs/Labs/Other Studies Reviewed:         Recent Labs: No results found for requested labs within last 365 days.  Recent Lipid Panel    Component Value Date/Time   CHOL 186 10/11/2019 0950   TRIG 136 10/11/2019 0950   HDL 49 10/11/2019 0950   CHOLHDL 3.8 10/11/2019 0950   LDLCALC 113 (H) 10/11/2019 0950   LDLDIRECT 31 10/08/2022 0832    Physical Exam:    VS:  BP 112/64 (BP Location: Left Arm, Patient Position: Sitting)   Pulse  62   Ht 5' 2 (1.575 m)   Wt 153 lb 12.8 oz (69.8 kg)   LMP 05/04/2008   SpO2 93%   BMI 28.13 kg/m     Wt Readings from Last 3 Encounters:  10/09/23 153 lb 12.8 oz (69.8 kg)  08/24/23 150 lb (68 kg)  03/24/23 151 lb 3.2 oz (68.6 kg)     GEN:  Well nourished, well developed in no acute distress HEENT: Normal NECK: No JVD; No carotid bruits LYMPHATICS: No lymphadenopathy CARDIAC: RRR, no murmurs, no rubs, no gallops RESPIRATORY:  Clear to auscultation without rales, wheezing or rhonchi  ABDOMEN: Soft, non-tender, non-distended MUSCULOSKELETAL:  No edema; No deformity  SKIN: Warm and dry LOWER EXTREMITIES: no swelling NEUROLOGIC:  Alert and oriented x 3 PSYCHIATRIC:  Normal affect   ASSESSMENT:    1. Atherosclerosis of native coronary artery of native heart without angina pectoris   2. Moderate chronic obstructive pulmonary disease (HCC)   3. Diabetes mellitus due to underlying condition with unspecified complications (HCC)    PLAN:    In order of problems listed above:  Coronary disease stable from that point review on guideline directed medical therapy which include aspirin . Dyslipidemia she is on Repatha  I will ask her to have fasting lipid profile today. Type 2 diabetes.  She had difficulty getting in touch with her primary care physician will check her routine blood test.  Can forward request to primary care physician to see her. Smoking huge problem spent at least 10  minutes talking with different techniques help to quit. Alcoholism.  She drinks vodka on the regular basis.  She understands the problem she is trying to work on quitting it.  I told her it will be beneficial   Medication Adjustments/Labs and Tests Ordered: Current medicines are reviewed at length with the patient today.  Concerns regarding medicines are outlined above.  No orders of the defined types were placed in this encounter.  Medication changes: No orders of the defined types were placed in this  encounter.   Signed, Manfred Seed, MD, [facility] 10/09/2023 9:54 AM    [facility]

## 2023-10-10 LAB — COMPREHENSIVE METABOLIC PANEL WITH GFR
ALT: 23 IU/L (ref 0–32)
AST: 26 IU/L (ref 0–40)
Albumin: 4.2 g/dL (ref 3.9–4.9)
Alkaline Phosphatase: 122 IU/L — ABNORMAL HIGH (ref 44–121)
BUN/Creatinine Ratio: 23 (ref 12–28)
BUN: 17 mg/dL (ref 8–27)
Bilirubin Total: 1.6 mg/dL — ABNORMAL HIGH (ref 0.0–1.2)
CO2: 24 mmol/L (ref 20–29)
Calcium: 9.4 mg/dL (ref 8.7–10.3)
Chloride: 101 mmol/L (ref 96–106)
Creatinine, Ser: 0.75 mg/dL (ref 0.57–1.00)
Globulin, Total: 2 g/dL (ref 1.5–4.5)
Glucose: 48 mg/dL — ABNORMAL LOW (ref 70–99)
Potassium: 3.6 mmol/L (ref 3.5–5.2)
Sodium: 143 mmol/L (ref 134–144)
Total Protein: 6.2 g/dL (ref 6.0–8.5)
eGFR: 89 mL/min/{1.73_m2} (ref >59)

## 2023-10-10 LAB — CBC
Hematocrit: 41.5 % (ref 34.0–46.6)
Hemoglobin: 13.6 g/dL (ref 11.1–15.9)
MCH: 33.3 pg — ABNORMAL HIGH (ref 26.6–33.0)
MCHC: 32.8 g/dL (ref 31.5–35.7)
MCV: 102 fL — ABNORMAL HIGH (ref 79–97)
Platelets: 235 10*3/uL (ref 150–450)
RBC: 4.08 x10E6/uL (ref 3.77–5.28)
RDW: 13.6 % (ref 11.7–15.4)
WBC: 7.5 10*3/uL (ref 3.4–10.8)

## 2023-10-10 LAB — LIPID PANEL
Chol/HDL Ratio: 1.8 ratio (ref 0.0–4.4)
Cholesterol, Total: 127 mg/dL (ref 100–199)
HDL: 72 mg/dL (ref >39)
LDL Chol Calc (NIH): 40 mg/dL (ref 0–99)
Triglycerides: 79 mg/dL (ref 0–149)
VLDL Cholesterol Cal: 15 mg/dL (ref 5–40)

## 2023-10-10 LAB — TSH: TSH: 0.901 u[IU]/mL (ref 0.450–4.500)

## 2023-10-12 ENCOUNTER — Ambulatory Visit: Payer: Self-pay | Admitting: Cardiology

## 2023-10-13 DIAGNOSIS — J4520 Mild intermittent asthma, uncomplicated: Secondary | ICD-10-CM | POA: Insufficient documentation

## 2023-10-15 ENCOUNTER — Telehealth: Payer: Self-pay

## 2023-10-15 NOTE — Telephone Encounter (Signed)
 Pt viewed lab results on My Chart per Dr. Vanetta Shawl note. Routed to PCP.

## 2023-10-15 NOTE — Telephone Encounter (Signed)
 Left message on My Chart with lab results per Dr. Vanetta Shawl note. Routed to PCP.

## 2023-10-19 ENCOUNTER — Telehealth: Payer: Self-pay

## 2023-10-19 ENCOUNTER — Other Ambulatory Visit: Payer: Self-pay | Admitting: Pharmacist

## 2023-10-19 NOTE — Telephone Encounter (Signed)
 Auth Submission: NO AUTH NEEDED Site of care: Site of care: [facility] Payer: [employer] state Medication & CPT/J Code(s) submitted: Reclast (Zolendronic acid) [id number] Diagnosis Code:  Route of submission (phone, fax, portal): portal Phone # Fax # Auth type: Buy/Bill PB Units/visits requested: 5mg , 1 dose Reference number:  Approval from: 10/19/23 to 04/27/24

## 2023-10-19 NOTE — Progress Notes (Signed)
 Therapy plan placed for Reclast IV [phone]) at [facility] [phone]) [place] to start benefits investigation. She is completing treatment with Forteo  this month.  Diagnosis: age-related osteoporosis  Provider: Dr. Maya Nash, [place] PA-C  Dose: 5mg  IV every 12 months Premedications: acetaminophen  650mg  p.o. and diphenhydramine 25mg  p.o.  Last Clinic Visit: 08/24/2023 Next Clinic Visit: 02/23/2024  Pertinent Labs: CMP on 10/09/23  Sherry Pennant, PharmD, MPH, BCPS, CPP Clinical Pharmacist (Rheumatology and Pulmonology)

## 2023-10-23 NOTE — Progress Notes (Signed)
 Reclast scheduled for 11/11/23

## 2023-11-01 ENCOUNTER — Encounter: Payer: Self-pay | Admitting: Cardiology

## 2023-11-02 ENCOUNTER — Telehealth: Payer: Self-pay

## 2023-11-02 DIAGNOSIS — I517 Cardiomegaly: Secondary | ICD-10-CM

## 2023-11-02 NOTE — Telephone Encounter (Signed)
 Echocardiogram per Dr. Bernie.

## 2023-11-04 ENCOUNTER — Ambulatory Visit: Attending: Cardiology

## 2023-11-04 DIAGNOSIS — I517 Cardiomegaly: Secondary | ICD-10-CM | POA: Diagnosis not present

## 2023-11-04 LAB — HM DEXA SCAN

## 2023-11-05 NOTE — Telephone Encounter (Signed)
 Ok to schedule visit to discuss updated DEXA and obtain updated lab work

## 2023-11-08 LAB — ECHOCARDIOGRAM COMPLETE
AR max vel: 1.56 cm2
AV Area VTI: 1.91 cm2
AV Area mean vel: 1.71 cm2
AV Mean grad: 3 mmHg
AV Peak grad: 6.1 mmHg
Ao pk vel: 1.24 m/s
Area-P 1/2: 2.87 cm2
MV VTI: 1.11 cm2
S' Lateral: 2.4 cm

## 2023-11-08 NOTE — Progress Notes (Unsigned)
 Office Visit Note  Patient: [name]             Date of Birth: 14-May-1960           MRN: [id number]             PCP: Benson Eleanor Rung, NP Referring: Benson Eleanor PARAS* Visit Date: 11/10/2023 Occupation: @GUAROCC @  Subjective:  No chief complaint on file.   History of Present Illness: [name] is a 63 y.o. female ***  Scheduled for reclast    DXA 03/20/21: BMD LFN 0.672 with T-score -2.6.  Patient was started on Forteo  daily injections on 09/30/21--she recently had a 3-week interruption due to issues with insurance but has otherwise remained on teriparatide  without interruption.  She has been tolerating Her diet without any side effects or injection site reactions.  She has not had any recent falls or fractures.  She is not currently taking a calcium or vitamin D  supplement.  She will be completing the course of teriparatide  the end of June 2025.  Different treatment options were discussed today to transition the patient to.  Discussed indications, contraindications, and potential side effects of IV Reclast  today in detail.  All questions were addressed.  Plan to apply for IV Reclast  through her insurance--okay to initiate IV Reclast  on or after July 2025. Plan to update DEXA ASAP-patient will have either her PCP or gynecologist order an updated bone density at the breast center.  We will discuss results with her once completed. She will require updated lab work including CMP and vitamin D  prior to initiating IV Reclast .  Future orders were placed today to be drawn at the end of June 2025.  She will follow up in 6 months or sooner if needed.   Activities of Daily Living:  Patient reports morning stiffness for *** {minute/hour:19697}.   Patient {ACTIONS;DENIES/REPORTS:21021675::Denies} nocturnal pain.  Difficulty dressing/grooming: {ACTIONS;DENIES/REPORTS:21021675::Denies} Difficulty climbing stairs: {ACTIONS;DENIES/REPORTS:21021675::Denies} Difficulty  getting out of chair: {ACTIONS;DENIES/REPORTS:21021675::Denies} Difficulty using hands for taps, buttons, cutlery, and/or writing: {ACTIONS;DENIES/REPORTS:21021675::Denies}  No Rheumatology ROS completed.   PMFS History:  Patient Active Problem List   Diagnosis Date Noted   Sacroiliac joint pain 05/26/2023   Acute pancreatitis with uninfected necrosis 04/15/2023   Liver cyst 03/24/2023   Liver hemangioma 03/24/2023   DOE (dyspnea on exertion) 05/16/2022   Influenzal pneumonia 05/15/2022   Osteoarthritis of right knee 12/07/2021   Dermatographic urticaria 10/18/2021   S/P lumbar laminectomy 09/18/2021   Elevated parathyroid hormone 06/06/2021   Osteoporosis of multiple sites without pathological fracture 06/06/2021   Accelerating angina (HCC) 05/06/2021   Complex regional pain syndrome of lower limb 03/07/2021   Pelvic pain 12/14/2020   RUQ pain 11/09/2020   Elevated alkaline phosphatase level 11/09/2020   Uncomplicated type 1 diabetes mellitus (HCC) 10/22/2020   Lipoma of left upper extremity 09/04/2020   Irritable bowel syndrome 09/03/2020   Idiopathic peripheral neuropathy 08/29/2020   Lipoma of abdominal wall 08/08/2020   Fever 07/18/2020   Hypotension 07/18/2020   Sepsis (HCC) 07/18/2020   Pneumonia 07/18/2020   Arthritis 07/18/2020   Blurred vision 07/11/2020   Facial tingling 07/11/2020   Diabetes mellitus without complication (HCC)    Acute left ankle pain 03/09/2020   History of pneumonia 01/25/2020   History of sepsis 01/25/2020   Respiratory failure (HCC) 01/13/2020   HAP (hospital-acquired pneumonia) 01/13/2020   Fusion of lumbar spine 01/11/2020   Trigger thumb of right hand 11/14/2019   Low back pain 10/22/2019   Obsessive-compulsive disorder  10/14/2019   Diabetes mellitus due to underlying condition with unspecified complications (HCC) 09/15/2019   Primary osteoarthritis involving multiple joints 08/24/2019   Alcohol use 04/15/2019   Elevated LFTs  03/10/2019   Lumbar post-laminectomy syndrome 03/04/2019   Acute bilateral thoracic back pain 12/03/2018   Pain of left hip joint 07/23/2018   Polyarthralgia 06/11/2018   Bursitis of both hips 04/14/2018   Left foot pain 01/28/2018   CAD (coronary artery disease) 12/24/2017   Secondary diabetes mellitus (HCC) 12/24/2017   Coronary arteriosclerosis 12/24/2017   Dyspnea on exertion 12/04/2017   Attention deficit 12/04/2017   Intermittent claudication (HCC) 12/04/2017   History of metabolic disorder 12/04/2017   Lung mass 12/04/2017   Myositis 12/04/2017   Erythrocytosis 12/04/2017   Senile purpura (HCC) 12/04/2017   Chest pain 11/06/2017   Multiple actinic keratoses 11/06/2017   Degeneration of lumbar intervertebral disc 11/06/2017   Fatigue 11/06/2017   Insulin  long-term use (HCC) 11/06/2017   Menopausal disorder 11/06/2017   Purpura (HCC) 11/06/2017   Moderate chronic obstructive pulmonary disease (HCC) 11/06/2017   Folic acid  deficiency 11/06/2017   Malaise and fatigue 11/06/2017   Arteriosclerotic vascular disease 11/06/2017   Dysphagia 11/04/2017   Sciatica 11/03/2017   Scoliosis deformity of spine 11/03/2017   Lumbar radiculopathy 05/26/2017   Personal history of (healed) traumatic fracture 12/31/2016   Deficiency of other specified B group vitamins 11/07/2016   Mixed hyperlipidemia 04/09/2016   Smoking greater than 30 pack years 02/26/2016   Cigarette smoker 02/26/2016   Nicotine dependence, cigarettes, uncomplicated 02/26/2016   Diabetes mellitus type 1, uncomplicated (HCC) 02/11/2016   Tobacco abuse 02/11/2016   Tobacco user 02/11/2016   Pure hypercholesterolemia 02/11/2016   Atherosclerosis of native coronary artery of native heart without angina pectoris 02/11/2016   Type 1 diabetes mellitus without complication (HCC) 02/11/2016   Coronary artery disease of native artery of native heart with stable angina pectoris (HCC) 01/25/2016   Abnormal thyroid  function  test 12/06/2015   Chronic bronchitis (HCC) 12/04/2015   Overweight with body mass index (BMI) 25.0-29.9 07/11/2015   BMI 27.0-27.9,adult 07/11/2015   Pain in both lower extremities 07/10/2015   Anxiety disorder 06/05/2015   Atherosclerosis 06/05/2015   Hemangioma 06/05/2015   Primary insomnia 06/05/2015   Trichotillomania 06/05/2015   HNP (herniated nucleus pulposus), lumbar 05/10/2014    Past Medical History:  Diagnosis Date   Abnormal thyroid  function test 12/06/2015   Accelerating angina (HCC) 05/06/2021   Acute bilateral thoracic back pain 12/03/2018   Acute left ankle pain 03/09/2020   Alcohol use 04/15/2019   Formatting of this note might be different from the original. Recommend abstinence from alcohol   Anxiety disorder 06/05/2015   Last Assessment & Plan:  Formatting of this note might be different from the original. Had started her on some xanax  about week ago and she feels it has helped her and she is taking about half tablet for this and will continue with it as she has been compliant with it   Arteriosclerotic vascular disease 11/06/2017   Arthritis    back    Atherosclerosis 06/05/2015   Formatting of this note might be different from the original. Seen on CTs of chest.   Atherosclerosis of native coronary artery of native heart without angina pectoris 02/11/2016   Attention deficit 12/04/2017   Blurred vision 07/11/2020   BMI 27.0-27.9,adult 07/11/2015   Last Assessment & Plan:  Formatting of this note might be different from the original. Relevant Hx: Course: Daily  Update: Today's Plan:difficult for her with the insulin  as well as her abilify and the weight is increaseing for her rather than decreasing, she has cut her alcohol intake back, she has worked on her diet more with decreasing her carb intake as well and still difficulty discussed with    Bursitis of both hips 04/14/2018   CAD (coronary artery disease) 12/24/2017   Chest pain 11/06/2017   Chronic bronchitis  (HCC) 12/04/2015   Formatting of this note might be different from the original. PFT ratio 88% FEV1 103% FVC 94% DLCO 76%  Last Assessment & Plan:  Formatting of this note might be different from the original. We discussed this as she has been coughing again for about 2 mnths and she sounds congested on exam with previous ease for pneumonia will place her on levaquin and have reviewed with her the importance of her    Cigarette smoker 02/26/2016   Complex regional pain syndrome of lower limb 03/07/2021   Coronary arteriosclerosis 12/24/2017   Coronary artery disease of native artery of native heart with stable angina pectoris (HCC) 01/25/2016   Formatting of this note might be different from the original. 2017:  50% LAD lesion.  Calcium noted other places.  Heart cath December 31, 2017 with a 30% ostial LAD lesion otherwise normal coronary arteries seen with LVEF 55 to 65%   Deficiency of other specified B group vitamins 11/07/2016   Degeneration of lumbar intervertebral disc 11/06/2017   Diabetes mellitus due to underlying condition with unspecified complications (HCC) 09/15/2019   Diabetes mellitus type 1, uncomplicated (HCC) 02/11/2016   Added automatically from request for surgery [id number]  Formatting of this note might be different from the original. Added automatically from request for surgery [id number]   Diabetes mellitus without complication (HCC)    Type 1- injections only- recent change to Toujeo  with improved A1C reading.   Dysphagia 11/04/2017   Dyspnea on exertion 12/04/2017   Elevated alkaline phosphatase level 11/09/2020   Elevated LFTs 03/10/2019   Formatting of this note might be different from the original. History.  Cannot change to this.   Elevated parathyroid hormone 06/06/2021   Erythrocytosis 12/04/2017   Facial tingling 07/11/2020   Fatigue 11/06/2017   Fever 07/18/2020   Folic acid  deficiency 11/06/2017   Fusion of lumbar spine 01/11/2020   HAP (hospital-acquired  pneumonia) 01/13/2020   Hemangioma 11/06/2017   History of metabolic disorder 12/04/2017   History of pneumonia 01/25/2020   Formatting of this note might be different from the original. Recent mutifocal pneumonia September 2021 Formatting of this note might be different from the original. Formatting of this note might be different from the original. Recent mutifocal pneumonia September 2021   History of sepsis 01/25/2020   Formatting of this note might be different from the original. Recent septic syndrome with multifocal pneumonia Formatting of this note might be different from the original. Formatting of this note might be different from the original. Recent septic syndrome with multifocal pneumonia   HNP (herniated nucleus pulposus), lumbar 05/10/2014   Hypotension 07/18/2020   Idiopathic peripheral neuropathy 08/29/2020   Insulin  long-term use (HCC) 11/06/2017   Intermittent claudication (HCC) 12/04/2017   Irritable bowel syndrome 09/03/2020   Left foot pain 01/28/2018   Lipoma of abdominal wall 08/08/2020   Lipoma of left upper extremity 09/04/2020   Low back pain 10/22/2019   Lumbar post-laminectomy syndrome 03/04/2019   Lumbar radiculopathy 05/26/2017   Lung mass 12/04/2017   Malaise and fatigue 11/06/2017  Menopausal disorder 11/06/2017   Mixed hyperlipidemia 04/09/2016   Formatting of this note might be different from the original. Added automatically from request for surgery [id number]   Moderate chronic obstructive pulmonary disease (HCC) 11/06/2017   Multiple actinic keratoses 11/06/2017   Myositis 12/04/2017   Nicotine dependence, cigarettes, uncomplicated 02/26/2016   Obsessive-compulsive disorder 10/14/2019   Formatting of this note might be different from the original. Formerly followed with [facility].   Osteoporosis of multiple sites without pathological fracture 06/06/2021   Overweight with body mass index (BMI) 25.0-29.9 07/11/2015   Last Assessment & Plan:  Relevant  Hx: Course: Daily Update: Today's Plan:difficult for her with the insulin  as well as her abilify and the weight is increaseing for her rather than decreasing, she has cut her alcohol intake back, she has worked on her diet more with decreasing her carb intake as well and still difficulty discussed with the saxenda and will try to get this approved for her and see    Pain in both lower extremities 07/10/2015   Last Assessment & Plan:  Formatting of this note might be different from the original. Relevant Hx: Course: Daily Update: Today's Plan:she had pain to her right anterior thigh there is edema of both legs and she has fear for DVT which she is going to have US  of and confirm ok, she has increased her weight which is part of her edema I believe as well as her insulin  usage. She is advised as well tha   Pain of left hip joint 07/23/2018   Pelvic pain 12/14/2020   Personal history of (healed) traumatic fracture 12/31/2016   Pneumonia    Polyarthralgia 06/11/2018   Primary insomnia 06/05/2015   Primary osteoarthritis involving multiple joints 08/24/2019   Pure hypercholesterolemia 02/11/2016   Purpura (HCC) 11/06/2017   Respiratory failure (HCC) 11/2018   RUQ pain 11/09/2020   S/P lumbar laminectomy 09/18/2021   Sciatica 11/03/2017   Scoliosis deformity of spine 11/03/2017   Secondary diabetes mellitus (HCC) 12/24/2017   Senile purpura (HCC) 12/04/2017   Sepsis (HCC) 07/18/2020   Smoking greater than 30 pack years 02/26/2016   Tobacco abuse 02/11/2016   Tobacco user 02/11/2016   Trichotillomania    hx.    Trigger thumb of right hand 11/14/2019   Uncomplicated type 1 diabetes mellitus (HCC) 10/22/2020    Family History  Problem Relation Age of Onset   Diabetes Mother    Cancer Mother    Hypertension Father    Heart disease Father    Diabetes Father    Healthy Brother    Healthy Son    Diabetes Son    Healthy Daughter    Past Surgical History:  Procedure Laterality Date    ABDOMINAL EXPOSURE N/A 01/11/2020   Procedure: ABDOMINAL EXPOSURE;  Surgeon: Gretta Lonni PARAS, MD;  Location: [facility] OR;  Service: Vascular;  Laterality: N/A;   ANTERIOR LUMBAR FUSION N/A 01/11/2020   Procedure: ANTERIOR LUMBAR INTERBODY FUSION  LUMBAR FIVE-SACRAL ONE, LEFT LUMBAR TWO-THREE FORAMINOTOMY, DISECTOMY;  Surgeon: Burnetta Aures, MD;  Location: [facility] OR;  Service: Orthopedics;  Laterality: N/A;   APPENDECTOMY  04/28/1970   BACK SURGERY  2016   CARDIAC CATHETERIZATION  12/31/2017   COLONOSCOPY  04/28/2017   CORONARY PRESSURE/FFR STUDY N/A 05/06/2021   Procedure: INTRAVASCULAR PRESSURE WIRE/FFR STUDY;  Surgeon: Mady Lonni, MD;  Location: [facility] INVASIVE CV LAB;  Service: Cardiovascular;  Laterality: N/A;   EXCISION/RELEASE BURSA HIP Left 04/14/2018   Procedure: Excision trochanteric bursa left hip;  Surgeon: Duwayne Purchase, MD;  Location: [facility] ORS;  Service: Orthopedics;  Laterality: Left;  60 mins   HAND SURGERY     HIP OPEN REDUCTION Right    ORIF   KNEE ARTHROSCOPY     LEFT HEART CATH AND CORONARY ANGIOGRAPHY N/A 12/31/2017   Procedure: LEFT HEART CATH AND CORONARY ANGIOGRAPHY;  Surgeon: Swaziland, Peter M, MD;  Location: [facility] INVASIVE CV LAB;  Service: Cardiovascular;  Laterality: N/A;   LEFT HEART CATH AND CORONARY ANGIOGRAPHY N/A 05/06/2021   Procedure: LEFT HEART CATH AND CORONARY ANGIOGRAPHY;  Surgeon: Mady Bruckner, MD;  Location: [facility] INVASIVE CV LAB;  Service: Cardiovascular;  Laterality: N/A;   LUMBAR LAMINECTOMY/DECOMPRESSION MICRODISCECTOMY Left 05/10/2014   Procedure: MICRODISCECTOMY LUMBAR DECOMPRESSION L5-S1 LEFT ;  Surgeon: Purchase JAYSON Duwayne, MD;  Location: [facility] ORS;  Service: Orthopedics;  Laterality: Left;   LUMBAR LAMINECTOMY/DECOMPRESSION MICRODISCECTOMY N/A 01/11/2020   Procedure: LUMBAR LAMINECTOMY/DECOMPRESSION MICRODISCECTOMY LEFT LUMBAR TWO-THREE;  Surgeon: Burnetta Aures, MD;  Location: [facility] OR;  Service: Orthopedics;  Laterality: N/A;   LUMBAR LAMINECTOMY/DECOMPRESSION  MICRODISCECTOMY Right 09/18/2021   Procedure: Laminectomy and Foraminotomy - Lumbar Four-Lumbar Five - right;  Surgeon: Joshua Alm RAMAN, MD;  Location: [facility] OR;  Service: Neurosurgery;  Laterality: Right;  Laminectomy and Foraminotomy - Lumbar Four-Lumbar Five - right   ORIF WRIST FRACTURE Right    retained hardware   ORTHOPAEDIC SURGERY  04/14/2018   TUBAL LIGATION  12/25/1995   Social History   Social History Narrative   ** Merged History Encounter **       Immunization History  Administered Date(s) Administered   Influenza,inj,Quad PF,6+ Mos 01/21/2016, 12/31/2016, 02/09/2018, 01/18/2021   Influenza,inj,quad, With Preservative 12/27/2016   Influenza-Unspecified 01/21/2016, 12/31/2016   Moderna Sars-Covid-2 Vaccination 04/29/2019, 05/30/2019   Pneumococcal Conjugate-13 07/23/2017   Pneumococcal Polysaccharide-23 02/01/2021   Unspecified SARS-COV-2 Vaccination 02/28/2020     Objective: Vital Signs: LMP 05/04/2008    Physical Exam   Musculoskeletal Exam: ***  CDAI Exam: CDAI Score: -- Patient Global: --; Provider Global: -- Swollen: --; Tender: -- Joint Exam 11/10/2023   No joint exam has been documented for this visit   There is currently no information documented on the homunculus. Go to the Rheumatology activity and complete the homunculus joint exam.  Investigation: No additional findings.  Imaging: ECHOCARDIOGRAM COMPLETE Result Date: 11/08/2023    ECHOCARDIOGRAM REPORT   Patient Name:   CHANLER SCHREITER Myrick Date of Exam: 11/04/2023 Medical Rec #:  [id number]      Height:       62.0 in Accession #:    [id number]     Weight:       153.8 lb Date of Birth:  April 28, 1961      BSA:          1.710 m Patient Age:    63 years       BP:           112/64 mmHg Patient Gender: F              HR:           57 bpm. Exam Location:  [facility] Procedure: 2D Echo, Cardiac Doppler, Color Doppler and Strain Analysis (Both            Spectral and Color Flow Doppler were utilized during procedure).  Indications:    Enlarged heart [I51.7 (ICD-10-CM)]  History:        Patient has no prior history of Echocardiogram examinations.  CAD, Signs/Symptoms:Hypotension, Chest Pain, Dyspnea and                 Fatigue; Risk Factors:Diabetes and Dyslipidemia.  Sonographer:    Saddie Chimes Referring Phys: [phone] ROBERT J KRASOWSKI IMPRESSIONS  1. Left ventricular ejection fraction, by estimation, is 60 to 65%. The left ventricle has normal function. The left ventricle has no regional wall motion abnormalities. Left ventricular diastolic parameters were normal.  2. Right ventricular systolic function is normal. The right ventricular size is normal. There is normal pulmonary artery systolic pressure.  3. The mitral valve is grossly normal. Trivial mitral valve regurgitation. No evidence of mitral stenosis.  4. The aortic valve was not well visualized. Aortic valve regurgitation is not visualized. No aortic stenosis is present.  5. The inferior vena cava is normal in size with greater than 50% respiratory variability, suggesting right atrial pressure of 3 mmHg. FINDINGS  Left Ventricle: Left ventricular ejection fraction, by estimation, is 60 to 65%. The left ventricle has normal function. The left ventricle has no regional wall motion abnormalities. Global longitudinal strain performed but not reported based on interpreter judgement due to suboptimal tracking. The left ventricular internal cavity size was normal in size. There is no left ventricular hypertrophy. Left ventricular diastolic parameters were normal. Right Ventricle: The right ventricular size is normal. Right vetricular wall thickness was not well visualized. Right ventricular systolic function is normal. There is normal pulmonary artery systolic pressure. The tricuspid regurgitant velocity is 1.11 m/s, and with an assumed right atrial pressure of 3 mmHg, the estimated right ventricular systolic pressure is 7.9 mmHg. Left Atrium: Left atrial size  was normal in size. Right Atrium: Right atrial size was normal in size. Pericardium: There is no evidence of pericardial effusion. Mitral Valve: The mitral valve is grossly normal. Trivial mitral valve regurgitation. No evidence of mitral valve stenosis. MV peak gradient, 3.5 mmHg. The mean mitral valve gradient is 1.0 mmHg. Tricuspid Valve: The tricuspid valve is not well visualized. Tricuspid valve regurgitation is not demonstrated. No evidence of tricuspid stenosis. Aortic Valve: The aortic valve was not well visualized. Aortic valve regurgitation is not visualized. No aortic stenosis is present. Aortic valve mean gradient measures 3.0 mmHg. Aortic valve peak gradient measures 6.1 mmHg. Aortic valve area, by VTI measures 1.91 cm. Pulmonic Valve: The pulmonic valve was not well visualized. Pulmonic valve regurgitation is trivial. No evidence of pulmonic stenosis. Aorta: The aortic root and ascending aorta are structurally normal, with no evidence of dilitation. Venous: The inferior vena cava is normal in size with greater than 50% respiratory variability, suggesting right atrial pressure of 3 mmHg. IAS/Shunts: The interatrial septum was not well visualized.  LEFT VENTRICLE PLAX 2D LVIDd:         4.00 cm   Diastology LVIDs:         2.40 cm   LV e' medial:    6.53 cm/s LV PW:         0.90 cm   LV E/e' medial:  12.1 LV IVS:        0.90 cm   LV e' lateral:   9.36 cm/s LVOT diam:     1.80 cm   LV E/e' lateral: 8.5 LV SV:         59 LV SV Index:   34 LVOT Area:     2.54 cm  RIGHT VENTRICLE RV Basal diam:  3.30 cm RV Mid diam:    2.20 cm TAPSE (M-mode): 2.2 cm LEFT  ATRIUM           Index LA diam:      3.60 cm 2.11 cm/m LA Vol (A4C): 30.5 ml 17.84 ml/m  AORTIC VALVE                    PULMONIC VALVE AV Area (Vmax):    1.56 cm     PV Vmax:       0.86 m/s AV Area (Vmean):   1.71 cm     PV Vmean:      57.900 cm/s AV Area (VTI):     1.91 cm     PV VTI:        0.203 m AV Vmax:           123.50 cm/s  PV Peak grad:  3.0  mmHg AV Vmean:          79.000 cm/s  PV Mean grad:  2.0 mmHg AV VTI:            0.308 m AV Peak Grad:      6.1 mmHg AV Mean Grad:      3.0 mmHg LVOT Vmax:         75.85 cm/s LVOT Vmean:        52.950 cm/s LVOT VTI:          0.231 m LVOT/AV VTI ratio: 0.75  AORTA Ao Asc diam: 3.00 cm MITRAL VALVE               TRICUSPID VALVE MV Area (PHT): 2.87 cm    TR Peak grad:   4.9 mmHg MV Area VTI:   1.11 cm    TR Vmax:        111.00 cm/s MV Peak grad:  3.5 mmHg MV Mean grad:  1.0 mmHg    SHUNTS MV Vmax:       0.93 m/s    Systemic VTI:  0.23 m MV Vmean:      49.3 cm/s   Systemic Diam: 1.80 cm MV Decel Time: 264 msec MV E velocity: 79.10 cm/s MV A velocity: 74.40 cm/s MV E/A ratio:  1.06 Sreedhar reddy Madireddy Electronically signed by Alean reddy Madireddy Signature Date/Time: 11/08/2023/10:12:45 AM    Final     Recent Labs: Lab Results  Component Value Date   WBC 7.5 10/09/2023   HGB 13.6 10/09/2023   PLT 235 10/09/2023   NA 143 10/09/2023   K 3.6 10/09/2023   CL 101 10/09/2023   CO2 24 10/09/2023   GLUCOSE 48 (L) 10/09/2023   BUN 17 10/09/2023   CREATININE 0.75 10/09/2023   BILITOT 1.6 (H) 10/09/2023   ALKPHOS 122 (H) 10/09/2023   AST 26 10/09/2023   ALT 23 10/09/2023   PROT 6.2 10/09/2023   ALBUMIN  4.2 10/09/2023   CALCIUM 9.4 10/09/2023   GFRAA >60 01/21/2020    Speciality Comments: Avoid oral bisphosphonates-history of GER Forteo  started September 26, 2021  Procedures:  No procedures performed Allergies: Celebrex [celecoxib] and Mobic [meloxicam]   Assessment / Plan:     Visit Diagnoses: Age-related osteoporosis without current pathological fracture  Vitamin D  deficiency  Medication monitoring encounter  Primary osteoarthritis of right knee  Primary osteoarthritis of left knee  Chronic pain of both shoulders  Primary osteoarthritis of both hands  Degeneration of intervertebral disc of lumbar region, unspecified whether pain present  Fusion of lumbar spine  Coronary artery  disease of native artery of native heart with stable angina pectoris (HCC)  Pure  hypercholesterolemia  Atherosclerosis  Diabetes mellitus type 1, uncomplicated (HCC)  History of gastroesophageal reflux (GERD)  History of sepsis  Moderate chronic obstructive pulmonary disease (HCC)  Trichotillomania  Nicotine dependence, cigarettes, uncomplicated  History of anxiety  Multiple actinic keratoses  Orders: No orders of the defined types were placed in this encounter.  No orders of the defined types were placed in this encounter.   Face-to-face time spent with patient was *** minutes. Greater than 50% of time was spent in counseling and coordination of care.  Follow-Up Instructions: No follow-ups on file.   Waddell CHRISTELLA Craze, PA-C  Note - This record has been created using Dragon software.  Chart creation errors have been sought, but may not always  have been located. Such creation errors do not reflect on  the standard of medical care.

## 2023-11-10 ENCOUNTER — Encounter: Payer: Self-pay | Admitting: Physician Assistant

## 2023-11-10 ENCOUNTER — Other Ambulatory Visit: Payer: Self-pay

## 2023-11-10 ENCOUNTER — Ambulatory Visit: Attending: Physician Assistant | Admitting: Physician Assistant

## 2023-11-10 VITALS — BP 102/58 | HR 67 | Resp 12 | Ht 61.75 in | Wt 152.8 lb

## 2023-11-10 DIAGNOSIS — M1711 Unilateral primary osteoarthritis, right knee: Secondary | ICD-10-CM

## 2023-11-10 DIAGNOSIS — M51369 Other intervertebral disc degeneration, lumbar region without mention of lumbar back pain or lower extremity pain: Secondary | ICD-10-CM

## 2023-11-10 DIAGNOSIS — I7090 Unspecified atherosclerosis: Secondary | ICD-10-CM

## 2023-11-10 DIAGNOSIS — M25512 Pain in left shoulder: Secondary | ICD-10-CM

## 2023-11-10 DIAGNOSIS — Z5181 Encounter for therapeutic drug level monitoring: Secondary | ICD-10-CM

## 2023-11-10 DIAGNOSIS — J449 Chronic obstructive pulmonary disease, unspecified: Secondary | ICD-10-CM

## 2023-11-10 DIAGNOSIS — M19042 Primary osteoarthritis, left hand: Secondary | ICD-10-CM

## 2023-11-10 DIAGNOSIS — F17210 Nicotine dependence, cigarettes, uncomplicated: Secondary | ICD-10-CM

## 2023-11-10 DIAGNOSIS — Z8719 Personal history of other diseases of the digestive system: Secondary | ICD-10-CM

## 2023-11-10 DIAGNOSIS — M25562 Pain in left knee: Secondary | ICD-10-CM

## 2023-11-10 DIAGNOSIS — E109 Type 1 diabetes mellitus without complications: Secondary | ICD-10-CM

## 2023-11-10 DIAGNOSIS — F633 Trichotillomania: Secondary | ICD-10-CM

## 2023-11-10 DIAGNOSIS — M19041 Primary osteoarthritis, right hand: Secondary | ICD-10-CM

## 2023-11-10 DIAGNOSIS — L570 Actinic keratosis: Secondary | ICD-10-CM

## 2023-11-10 DIAGNOSIS — M4326 Fusion of spine, lumbar region: Secondary | ICD-10-CM

## 2023-11-10 DIAGNOSIS — M1712 Unilateral primary osteoarthritis, left knee: Secondary | ICD-10-CM

## 2023-11-10 DIAGNOSIS — I25118 Atherosclerotic heart disease of native coronary artery with other forms of angina pectoris: Secondary | ICD-10-CM

## 2023-11-10 DIAGNOSIS — E559 Vitamin D deficiency, unspecified: Secondary | ICD-10-CM

## 2023-11-10 DIAGNOSIS — Z8659 Personal history of other mental and behavioral disorders: Secondary | ICD-10-CM

## 2023-11-10 DIAGNOSIS — M810 Age-related osteoporosis without current pathological fracture: Secondary | ICD-10-CM | POA: Diagnosis not present

## 2023-11-10 DIAGNOSIS — E7800 Pure hypercholesterolemia, unspecified: Secondary | ICD-10-CM

## 2023-11-10 DIAGNOSIS — M25511 Pain in right shoulder: Secondary | ICD-10-CM

## 2023-11-10 DIAGNOSIS — Z8619 Personal history of other infectious and parasitic diseases: Secondary | ICD-10-CM

## 2023-11-10 DIAGNOSIS — G8929 Other chronic pain: Secondary | ICD-10-CM

## 2023-11-10 LAB — COMPREHENSIVE METABOLIC PANEL WITH GFR
AG Ratio: 1.7 (calc) (ref 1.0–2.5)
ALT: 15 U/L (ref 6–29)
AST: 13 U/L (ref 10–35)
Albumin: 3.8 g/dL (ref 3.6–5.1)
Alkaline phosphatase (APISO): 85 U/L (ref 37–153)
BUN: 20 mg/dL (ref 7–25)
CO2: 32 mmol/L (ref 20–32)
Calcium: 9.7 mg/dL (ref 8.6–10.4)
Chloride: 100 mmol/L (ref 98–110)
Creat: 0.65 mg/dL (ref 0.50–1.05)
Globulin: 2.2 g/dL (ref 1.9–3.7)
Glucose, Bld: 193 mg/dL — ABNORMAL HIGH (ref 65–99)
Potassium: 4.3 mmol/L (ref 3.5–5.3)
Sodium: 138 mmol/L (ref 135–146)
Total Bilirubin: 0.9 mg/dL (ref 0.2–1.2)
Total Protein: 6 g/dL — ABNORMAL LOW (ref 6.1–8.1)
eGFR: 98 mL/min/1.73m2 (ref >=60)

## 2023-11-10 LAB — VITAMIN D 25 HYDROXY (VIT D DEFICIENCY, FRACTURES): Vit D, 25-Hydroxy: 71 ng/mL (ref 30–100)

## 2023-11-10 NOTE — Progress Notes (Signed)
 Referral placed per Jacinta Martinis.

## 2023-11-11 ENCOUNTER — Ambulatory Visit

## 2023-11-11 ENCOUNTER — Telehealth: Payer: Self-pay

## 2023-11-11 ENCOUNTER — Ambulatory Visit: Payer: Self-pay | Admitting: Physician Assistant

## 2023-11-11 VITALS — BP 93/52 | HR 66 | Temp 98.2°F | Resp 16 | Ht 61.75 in | Wt 153.0 lb

## 2023-11-11 DIAGNOSIS — M810 Age-related osteoporosis without current pathological fracture: Secondary | ICD-10-CM

## 2023-11-11 MED ORDER — DIPHENHYDRAMINE HCL 25 MG PO CAPS
25.0000 mg | ORAL_CAPSULE | Freq: Once | ORAL | Status: AC
  Administered 2023-11-11: 25 mg via ORAL
  Filled 2023-11-11: qty 1

## 2023-11-11 MED ORDER — ZOLEDRONIC ACID 5 MG/100ML IV SOLN
5.0000 mg | Freq: Once | INTRAVENOUS | Status: AC
Start: 2023-11-11 — End: 2023-11-11
  Administered 2023-11-11: 5 mg via INTRAVENOUS
  Filled 2023-11-11: qty 100

## 2023-11-11 MED ORDER — ACETAMINOPHEN 325 MG PO TABS
650.0000 mg | ORAL_TABLET | Freq: Once | ORAL | Status: AC
  Administered 2023-11-11: 650 mg via ORAL
  Filled 2023-11-11: qty 2

## 2023-11-11 MED ORDER — SODIUM CHLORIDE 0.9 % IV SOLN: INTRAVENOUS | Status: DC

## 2023-11-11 NOTE — Progress Notes (Signed)
 Diagnosis: Osteoporosis  Provider:  Lonna Coder MD  Procedure: IV Infusion  IV Type: Peripheral, IV Location: L Antecubital  Reclast  (Zolendronic Acid), Dose: 5 mg  Infusion Start Time: 1005  Infusion Stop Time: 1036  Post Infusion IV Care: Observation period completed and Peripheral IV Discontinued  Discharge: Condition: Good, Destination: Home . AVS Declined  Performed by:  Leita FORBES Miles, LPN

## 2023-11-11 NOTE — Progress Notes (Signed)
 Glucose is elevated-193. Total protein is borderline low. Rest of CMP WNL.   Vitamin D  WNL

## 2023-11-11 NOTE — Telephone Encounter (Signed)
 Received DEXA results from [facility].  Date of Scan: 11/04/2023  Lowest T-score:-2.4  BMD:0.586  Lowest site measured:Left Femoral Neck  DX: Osteopenia  Significant changes in BMD and site measured (5% and above):N/A  Current Regimen: Forteo  Started 09/26/2021  *Per last office visit note to plan to transition to IV Reclast   Recommendation:Discuss at upcoming visit  Reviewed by:Waddell Craze   Next Appointment: 02/23/2024

## 2023-11-12 NOTE — Telephone Encounter (Signed)
 D/w Waddell Craze, PA-C. Appears to be post-infusion symptoms - likely to be transient.  Will likely defer on scheduling further Reclast  infusions. Will not be adding Reclast  to allergy list as these are reported side effects and not a true allergy

## 2023-11-13 ENCOUNTER — Ambulatory Visit: Payer: Self-pay | Admitting: Cardiology

## 2023-11-13 ENCOUNTER — Telehealth: Payer: Self-pay

## 2023-11-13 NOTE — Telephone Encounter (Signed)
 Updated dx  for DOS 10/09/2023 faxed to [facility].

## 2023-11-17 ENCOUNTER — Encounter: Payer: Self-pay | Admitting: Specialist

## 2023-11-18 ENCOUNTER — Encounter: Payer: Self-pay | Admitting: Physical Medicine & Rehabilitation

## 2023-11-18 ENCOUNTER — Telehealth: Payer: Self-pay

## 2023-11-18 NOTE — Telephone Encounter (Signed)
 Left message on My Chart with Echo results per Dr. Vanetta Shawl note. Routed to PCP.

## 2023-11-19 ENCOUNTER — Telehealth: Payer: Self-pay

## 2023-11-19 NOTE — Telephone Encounter (Signed)
 Pt viewed Echo results on My Chart per Dr. Vanetta Shawl note. Routed to PCP.

## 2023-12-03 NOTE — Telephone Encounter (Signed)
 Patient has been under the care of [facility] for management of knee pain.  Recommend evaluation by orthopedist.

## 2023-12-20 ENCOUNTER — Other Ambulatory Visit: Payer: Self-pay | Admitting: Cardiology

## 2023-12-28 HISTORY — PX: KNEE ARTHROSCOPY: SUR90

## 2023-12-30 DIAGNOSIS — S83249A Other tear of medial meniscus, current injury, unspecified knee, initial encounter: Secondary | ICD-10-CM | POA: Insufficient documentation

## 2023-12-31 ENCOUNTER — Telehealth: Payer: Self-pay | Admitting: *Deleted

## 2023-12-31 NOTE — Telephone Encounter (Signed)
   Pre-operative Risk Assessment    Patient Name: [name]  DOB: 06-03-1960 MRN: [id number]   Date of last office visit: 10/09/23 DR. KRASOWSKI Date of next office visit: NONE   Request for Surgical Clearance    Procedure:  RIGHT KNEE ARTHROSCOPY  Date of Surgery:  Clearance 02/02/24                                Surgeon:  DR. DEMPSEY MOAN Surgeon's Group or Practice Name:  [facility] Phone number:  [phone]  Fax number:  [phone] KERRI MAZE   Type of Clearance Requested:   - Medical  - Pharmacy:  Hold Aspirin      Type of Anesthesia:  CHOICE   Additional requests/questions:    Bonney Niels Jest   12/31/2023, 4:13 PM

## 2024-01-01 NOTE — Telephone Encounter (Signed)
   Name: [name]  DOB: Jun 02, 1960  MRN: [id number]  Primary Cardiologist: Jennifer JONELLE Crape, MD   Preoperative team, please contact this patient and set up a phone call appointment for further preoperative risk assessment. Please obtain consent and complete medication review. Thank you for your help.  I confirm that guidance regarding antiplatelet and oral anticoagulation therapy has been completed and, if necessary, noted below.  Per office protocol, if patient is without any new symptoms or concerns at the time of their virtual visit, she may hold ASA for 7 days prior to procedure. Please resume ASA as soon as possible postprocedure, at the discretion of the surgeon.    I also confirmed the patient resides in the state of [place] . As per [facility] Medical Board telemedicine laws, the patient must reside in the state in which the provider is licensed.   Lamarr Satterfield, NP 01/01/2024, 8:36 AM [facility]

## 2024-01-01 NOTE — Telephone Encounter (Signed)
 Left message for pt to call our office and ask for the preop team to schedule TELE Preop appt.

## 2024-01-04 ENCOUNTER — Telehealth: Payer: Self-pay | Admitting: *Deleted

## 2024-01-04 NOTE — Telephone Encounter (Signed)
 Pt states she see's Dr. Krasowski. Pt has been scheduled tele preop appt 01/18/24. Med rec and consent are done.      Patient Consent for Virtual Visit        [name] has provided verbal consent on 01/04/2024 for a virtual visit (video or telephone).   CONSENT FOR VIRTUAL VISIT FOR:  [name]  By participating in this virtual visit I agree to the following:  I hereby voluntarily request, consent and authorize [facility] and its employed or contracted physicians, physician assistants, nurse practitioners or other licensed health care professionals (the Practitioner), to provide me with telemedicine health care services (the "Services) as deemed necessary by the treating Practitioner. I acknowledge and consent to receive the Services by the Practitioner via telemedicine. I understand that the telemedicine visit will involve communicating with the Practitioner through live audiovisual communication technology and the disclosure of certain medical information by electronic transmission. I acknowledge that I have been given the opportunity to request an in-person assessment or other available alternative prior to the telemedicine visit and am voluntarily participating in the telemedicine visit.  I understand that I have the right to withhold or withdraw my consent to the use of telemedicine in the course of my care at any time, without affecting my right to future care or treatment, and that the Practitioner or I may terminate the telemedicine visit at any time. I understand that I have the right to inspect all information obtained and/or recorded in the course of the telemedicine visit and may receive copies of available information for a reasonable fee.  I understand that some of the potential risks of receiving the Services via telemedicine include:  Delay or interruption in medical evaluation due to technological equipment failure or disruption; Information transmitted may not  be sufficient (e.g. poor resolution of images) to allow for appropriate medical decision making by the Practitioner; and/or  In rare instances, security protocols could fail, causing a breach of personal health information.  Furthermore, I acknowledge that it is my responsibility to provide information about my medical history, conditions and care that is complete and accurate to the best of my ability. I acknowledge that Practitioner's advice, recommendations, and/or decision may be based on factors not within their control, such as incomplete or inaccurate data provided by me or distortions of diagnostic images or specimens that may result from electronic transmissions. I understand that the practice of medicine is not an exact science and that Practitioner makes no warranties or guarantees regarding treatment outcomes. I acknowledge that a copy of this consent can be made available to me via my patient portal [facility] MyChart), or I can request a printed copy by calling the office of [facility].    I understand that my insurance will be billed for this visit.   I have read or had this consent read to me. I understand the contents of this consent, which adequately explains the benefits and risks of the Services being provided via telemedicine.  I have been provided ample opportunity to ask questions regarding this consent and the Services and have had my questions answered to my satisfaction. I give my informed consent for the services to be provided through the use of telemedicine in my medical care

## 2024-01-04 NOTE — Telephone Encounter (Signed)
 Patient returned Pre-op call.

## 2024-01-04 NOTE — Telephone Encounter (Signed)
 I did call cell # as well [phone]. Man answered and said to call her on [phone]. I stated I did and left a message

## 2024-01-04 NOTE — Telephone Encounter (Signed)
 Pt was returning preop call and is requesting a callback at [phone]. Please advise

## 2024-01-04 NOTE — Telephone Encounter (Signed)
 Pt states she see's Dr. Krasowski. Pt has been scheduled tele preop appt 01/18/24. Med rec and consent are done.

## 2024-01-14 ENCOUNTER — Encounter: Admitting: Physical Medicine & Rehabilitation

## 2024-01-14 ENCOUNTER — Other Ambulatory Visit: Payer: Self-pay | Admitting: Physician Assistant

## 2024-01-14 DIAGNOSIS — M533 Sacrococcygeal disorders, not elsewhere classified: Secondary | ICD-10-CM

## 2024-01-18 ENCOUNTER — Ambulatory Visit: Attending: Cardiovascular Disease | Admitting: Nurse Practitioner

## 2024-01-18 DIAGNOSIS — Z01810 Encounter for preprocedural cardiovascular examination: Secondary | ICD-10-CM | POA: Diagnosis not present

## 2024-01-18 NOTE — Progress Notes (Signed)
 Virtual Visit via Telephone Note   Because of [name] co-morbid illnesses, she is at least at moderate risk for complications without adequate follow up.  This format is felt to be most appropriate for this patient at this time.  Due to technical limitations with video connection (technology), today's appointment will be conducted as an audio only telehealth visit, and [name] verbally agreed to proceed in this manner.   All issues noted in this document were discussed and addressed.  No physical exam could be performed with this format.  Evaluation Performed:  Preoperative cardiovascular risk assessment _____________   Date:  01/18/2024   Patient ID:  [name], DOB [birth year], MRN [id number] Patient Location:  Home Provider location:   Office  Primary Care Provider:  Benson Eleanor Rung, NP Primary Cardiologist:  Jennifer JONELLE Crape, MD  Chief Complaint / Patient Profile   63 y.o. y/o female with a h/o nonobstructive CAD, hypertension, hyperlipidemia, type 2 diabetes, tobacco use, and alcohol use who is pending right knee arthroplasty on 02/02/2024 with Dr. Dempsey Moan of [facility] and presents today for telephonic preoperative cardiovascular risk assessment.  History of Present Illness    [name] is a 63 y.o. female who presents via audio/video conferencing for a telehealth visit today.  Pt was last seen in cardiology clinic on 10/09/2023 by Dr. Bernie. At that time [name] was doing well.  The patient is now pending procedure as outlined above. Since her last visit, she has done well from a cardiac standpoint.   She denies chest pain, palpitations, dyspnea, pnd, orthopnea, n, v, dizziness, syncope, edema, weight gain, or early satiety. All other systems reviewed and are otherwise negative except as noted above.   Past Medical History    Past Medical History:  Diagnosis Date   Abnormal thyroid  function test 12/06/2015   Accelerating  angina (HCC) 05/06/2021   Acute bilateral thoracic back pain 12/03/2018   Acute left ankle pain 03/09/2020   Alcohol use 04/15/2019   Formatting of this note might be different from the original. Recommend abstinence from alcohol   Anxiety disorder 06/05/2015   Last Assessment & Plan:  Formatting of this note might be different from the original. Had started her on some xanax  about week ago and she feels it has helped her and she is taking about half tablet for this and will continue with it as she has been compliant with it   Arteriosclerotic vascular disease 11/06/2017   Arthritis    back    Atherosclerosis 06/05/2015   Formatting of this note might be different from the original. Seen on CTs of chest.   Atherosclerosis of native coronary artery of native heart without angina pectoris 02/11/2016   Attention deficit 12/04/2017   Blurred vision 07/11/2020   BMI 27.0-27.9,adult 07/11/2015   Last Assessment & Plan:  Formatting of this note might be different from the original. Relevant Hx: Course: Daily Update: Today's Plan:difficult for her with the insulin  as well as her abilify and the weight is increaseing for her rather than decreasing, she has cut her alcohol intake back, she has worked on her diet more with decreasing her carb intake as well and still difficulty discussed with    Bursitis of both hips 04/14/2018   CAD (coronary artery disease) 12/24/2017   Chest pain 11/06/2017   Chronic bronchitis (HCC) 12/04/2015   Formatting of this note might be different from the original. PFT ratio 88% FEV1 103% FVC  94% DLCO 76%  Last Assessment & Plan:  Formatting of this note might be different from the original. We discussed this as she has been coughing again for about 2 mnths and she sounds congested on exam with previous ease for pneumonia will place her on levaquin and have reviewed with her the importance of her    Cigarette smoker 02/26/2016   Complex regional pain syndrome of lower limb  03/07/2021   Coronary arteriosclerosis 12/24/2017   Coronary artery disease of native artery of native heart with stable angina pectoris (HCC) 01/25/2016   Formatting of this note might be different from the original. 2017:  50% LAD lesion.  Calcium noted other places.  Heart cath December 31, 2017 with a 30% ostial LAD lesion otherwise normal coronary arteries seen with LVEF 55 to 65%   Deficiency of other specified B group vitamins 11/07/2016   Degeneration of lumbar intervertebral disc 11/06/2017   Diabetes mellitus due to underlying condition with unspecified complications (HCC) 09/15/2019   Diabetes mellitus type 1, uncomplicated (HCC) 02/11/2016   Added automatically from request for surgery [id number]  Formatting of this note might be different from the original. Added automatically from request for surgery [id number]   Diabetes mellitus without complication (HCC)    Type 1- injections only- recent change to [place]  with improved A1C reading.   Dysphagia 11/04/2017   Dyspnea on exertion 12/04/2017   Elevated alkaline phosphatase level 11/09/2020   Elevated LFTs 03/10/2019   Formatting of this note might be different from the original. History.  Cannot change to this.   Elevated parathyroid hormone 06/06/2021   Erythrocytosis 12/04/2017   Facial tingling 07/11/2020   Fatigue 11/06/2017   Fever 07/18/2020   Folic acid  deficiency 11/06/2017   Fusion of lumbar spine 01/11/2020   HAP (hospital-acquired pneumonia) 01/13/2020   Hemangioma 11/06/2017   History of metabolic disorder 12/04/2017   History of pneumonia 01/25/2020   Formatting of this note might be different from the original. Recent mutifocal pneumonia September 2021 Formatting of this note might be different from the original. Formatting of this note might be different from the original. Recent mutifocal pneumonia September 2021   History of sepsis 01/25/2020   Formatting of this note might be different from the original. Recent septic  syndrome with multifocal pneumonia Formatting of this note might be different from the original. Formatting of this note might be different from the original. Recent septic syndrome with multifocal pneumonia   HNP (herniated nucleus pulposus), lumbar 05/10/2014   Hypotension 07/18/2020   Idiopathic peripheral neuropathy 08/29/2020   Insulin  long-term use (HCC) 11/06/2017   Intermittent claudication 12/04/2017   Irritable bowel syndrome 09/03/2020   Left foot pain 01/28/2018   Lipoma of abdominal wall 08/08/2020   Lipoma of left upper extremity 09/04/2020   Low back pain 10/22/2019   Lumbar post-laminectomy syndrome 03/04/2019   Lumbar radiculopathy 05/26/2017   Lung mass 12/04/2017   Malaise and fatigue 11/06/2017   Menopausal disorder 11/06/2017   Mixed hyperlipidemia 04/09/2016   Formatting of this note might be different from the original. Added automatically from request for surgery [id number]   Moderate chronic obstructive pulmonary disease (HCC) 11/06/2017   Multiple actinic keratoses 11/06/2017   Myositis 12/04/2017   Nicotine dependence, cigarettes, uncomplicated 02/26/2016   Obsessive-compulsive disorder 10/14/2019   Formatting of this note might be different from the original. Formerly followed with [facility].   Osteoporosis of multiple sites without pathological fracture 06/06/2021   Overweight with body mass index (BMI)  25.0-29.9 07/11/2015   Last Assessment & Plan:  Relevant Hx: Course: Daily Update: Today's Plan:difficult for her with the insulin  as well as her abilify and the weight is increaseing for her rather than decreasing, she has cut her alcohol intake back, she has worked on her diet more with decreasing her carb intake as well and still difficulty discussed with the saxenda and will try to get this approved for her and see    Pain in both lower extremities 07/10/2015   Last Assessment & Plan:  Formatting of this note might be different from the original. Relevant Hx:  Course: Daily Update: Today's Plan:she had pain to her right anterior thigh there is edema of both legs and she has fear for DVT which she is going to have US  of and confirm ok, she has increased her weight which is part of her edema I believe as well as her insulin  usage. She is advised as well tha   Pain of left hip joint 07/23/2018   Pelvic pain 12/14/2020   Personal history of (healed) traumatic fracture 12/31/2016   Pneumonia    Polyarthralgia 06/11/2018   Primary insomnia 06/05/2015   Primary osteoarthritis involving multiple joints 08/24/2019   Pure hypercholesterolemia 02/11/2016   Purpura 11/06/2017   Respiratory failure (HCC) 11/2018   RUQ pain 11/09/2020   S/P lumbar laminectomy 09/18/2021   Sciatica 11/03/2017   Scoliosis deformity of spine 11/03/2017   Secondary diabetes mellitus (HCC) 12/24/2017   Senile purpura 12/04/2017   Sepsis (HCC) 07/18/2020   Smoking greater than 30 pack years 02/26/2016   Tobacco abuse 02/11/2016   Tobacco user 02/11/2016   Trichotillomania    hx.    Trigger thumb of right hand 11/14/2019   Uncomplicated type 1 diabetes mellitus (HCC) 10/22/2020   Past Surgical History:  Procedure Laterality Date   ABDOMINAL EXPOSURE N/A 01/11/2020   Procedure: ABDOMINAL EXPOSURE;  Surgeon: Gretta Lonni PARAS, MD;  Location: [facility] OR;  Service: Vascular;  Laterality: N/A;   ANTERIOR LUMBAR FUSION N/A 01/11/2020   Procedure: ANTERIOR LUMBAR INTERBODY FUSION  LUMBAR FIVE-SACRAL ONE, LEFT LUMBAR TWO-THREE FORAMINOTOMY, DISECTOMY;  Surgeon: Burnetta Aures, MD;  Location: [facility] OR;  Service: Orthopedics;  Laterality: N/A;   APPENDECTOMY  04/28/1970   BACK SURGERY  2016   CARDIAC CATHETERIZATION  12/31/2017   COLONOSCOPY  04/28/2017   CORONARY PRESSURE/FFR STUDY N/A 05/06/2021   Procedure: INTRAVASCULAR PRESSURE WIRE/FFR STUDY;  Surgeon: Mady Lonni, MD;  Location: [facility] INVASIVE CV LAB;  Service: Cardiovascular;  Laterality: N/A;   EXCISION/RELEASE BURSA HIP Left  04/14/2018   Procedure: Excision trochanteric bursa left hip;  Surgeon: Duwayne Purchase, MD;  Location: [facility] ORS;  Service: Orthopedics;  Laterality: Left;  60 mins   HAND SURGERY     HIP OPEN REDUCTION Right    ORIF   KNEE ARTHROSCOPY     LEFT HEART CATH AND CORONARY ANGIOGRAPHY N/A 12/31/2017   Procedure: LEFT HEART CATH AND CORONARY ANGIOGRAPHY;  Surgeon: Swazil[place], Peter M, MD;  Location: [facility] INVASIVE CV LAB;  Service: Cardiovascular;  Laterality: N/A;   LEFT HEART CATH AND CORONARY ANGIOGRAPHY N/A 05/06/2021   Procedure: LEFT HEART CATH AND CORONARY ANGIOGRAPHY;  Surgeon: Mady Lonni, MD;  Location: [facility] INVASIVE CV LAB;  Service: Cardiovascular;  Laterality: N/A;   LUMBAR LAMINECTOMY/DECOMPRESSION MICRODISCECTOMY Left 05/10/2014   Procedure: MICRODISCECTOMY LUMBAR DECOMPRESSION L5-S1 LEFT ;  Surgeon: Purchase JAYSON Duwayne, MD;  Location: [facility] ORS;  Service: Orthopedics;  Laterality: Left;   LUMBAR LAMINECTOMY/DECOMPRESSION MICRODISCECTOMY N/A 01/11/2020  Procedure: LUMBAR LAMINECTOMY/DECOMPRESSION MICRODISCECTOMY LEFT LUMBAR TWO-THREE;  Surgeon: Burnetta Aures, MD;  Location: [facility] OR;  Service: Orthopedics;  Laterality: N/A;   LUMBAR LAMINECTOMY/DECOMPRESSION MICRODISCECTOMY Right 09/18/2021   Procedure: Laminectomy and Foraminotomy - Lumbar Four-Lumbar Five - right;  Surgeon: Joshua Alm RAMAN, MD;  Location: [facility] OR;  Service: Neurosurgery;  Laterality: Right;  Laminectomy and Foraminotomy - Lumbar Four-Lumbar Five - right   ORIF WRIST FRACTURE Right    retained hardware   ORTHOPAEDIC SURGERY  04/14/2018   TUBAL LIGATION  12/25/1995    Allergies  Allergies  Allergen Reactions   Celebrex [Celecoxib] Swelling    Face swelling   Mobic [Meloxicam] Swelling    Face swelling    Home Medications    Prior to Admission medications   Medication Sig Start Date End Date Taking? Authorizing Provider  ALPRAZolam  (XANAX ) 0.5 MG tablet Take 0.5 mg by mouth at bedtime.  08/08/19   [provider]  aspirin   EC 81 MG tablet Take 1 tablet (81 mg total) by mouth daily. Swallow whole. 01/14/22   Krasowski, Robert J, MD  atorvastatin (LIPITOR) 80 MG tablet Take 80 mg by mouth daily.    [provider]  Continuous Glucose Sensor (DEXCOM G6 SENSOR) MISC Apply topically. 08/11/23   [provider]  Continuous Glucose Transmitter (DEXCOM G6 TRANSMITTER) MISC as directed. 08/11/23   [provider]  cyanocobalamin (,VITAMIN B-12,) 1000 MCG/ML injection Inject 1,000 mcg into the muscle every 14 (fourteen) days.    [provider]  Evolocumab  (REPATHA  SURECLICK) 140 MG/ML SOAJ Inject 140 mg into the skin every 14 (fourteen) days. 06/24/23   Krasowski, Robert J, MD  finasteride (PROSCAR) 5 MG tablet Take 2.5 mg by mouth every evening. 08/21/20   [provider]  fluorouracil (EFUDEX) 5 % cream APPLY TO THE AFFECTED AREA TWO (2) TIMES DAILY FOR 1-2 WEEKS UNTIL INFLAMMED, AS DIRECTED Patient not taking: Reported on 01/04/2024    [provider]  FLUoxetine  (PROZAC ) 40 MG capsule Take 80 mg by mouth every evening. 07/11/19   [provider]  folic acid  (FOLVITE ) 1 MG tablet Take 1 mg by mouth at bedtime.     [provider]  FORTEO  600 MCG/2.4ML SOPN INJECT 20 MCG UNDER THE SKIN 1 TIME A DAY. DISCARD PEN 28 DAYS AFTER INITIAL USE Patient not taking: Reported on 11/10/2023 12/03/22   Cheryl Waddell HERO, PA-C  furosemide  (LASIX ) 40 MG tablet Take 20-40 mg by mouth daily as needed for fluid.    [provider]  insulin  aspart (NOVOLOG  FLEXPEN) 100 UNIT/ML FlexPen Inject 0-10 Units into the skin 3 (three) times daily with meals. Per sliding scale 07/26/18   [provider]  insulin  degludec (TRESIBA  FLEXTOUCH) 200 UNIT/ML FlexTouch Pen Inject 32 Units into the skin in the morning. 07/19/18   [provider]  Insulin  Pen Needle 31G X 5 MM MISC Use to inject Forteo  once daily 08/06/22   Cheryl Waddell HERO, PA-C  ipratropium-albuterol  (DUONEB)  0.5-2.5 (3) MG/3ML SOLN inhale one vial via nebulizer every six hours as needed FOR wheezing Patient taking differently: PRN    [provider]  linaclotide  (LINZESS ) 290 MCG CAPS capsule Take 290 mcg by mouth at bedtime.  07/03/17   [provider]  metoprolol  succinate (TOPROL -XL) 25 MG 24 hr tablet Take 1 tablet (25 mg total) by mouth daily. 12/21/23   Monetta Redell PARAS, MD  nitroGLYCERIN  (NITROSTAT ) 0.4 MG SL tablet Place 1 tablet (0.4 mg total) under the  tongue every 5 (five) minutes as needed for chest pain. 05/02/21   Krasowski, Robert J, MD  pantoprazole  (PROTONIX ) 40 MG tablet Take 40 mg by mouth at bedtime. 01/19/21   [provider]  Teriparatide  600 MCG/2.4ML SOPN INJECT 20 MCG UNDER THE SKIN 1 TIME A DAY. DISCARD PEN 28 DAYS AFTER INITIAL USE Patient not taking: Reported on 01/04/2024 05/29/23   Cheryl Waddell HERO, PA-C  tiZANidine (ZANAFLEX) 4 MG tablet as needed. 10/15/23   [provider]  triamcinolone  cream (KENALOG ) 0.1 % APPLY TO THE AFFECTED AREA(S) TWO (2) TIMES DAILY FOR 5-7 DAYS, THEN APPLY 2-3 TIMES WEEKLY AS NEEDED THEREAFTER Patient not taking: Reported on 01/04/2024    [provider]    Physical Exam    Vital Signs:  [name] does not have vital signs available for review today.  Given telephonic nature of communication, physical exam is limited. AAOx3. NAD. Normal affect.  Speech and respirations are unlabored.  Accessory Clinical Findings    None  Assessment & Plan    1.  Preoperative Cardiovascular Risk Assessment:  According to the Revised Cardiac Risk Index (RCRI), her Perioperative Risk of Major Cardiac Event is (%): 0.9. Her Functional Capacity in METs is: 7.01 according to the [facility] Activity Status Index (DASI). Therefore, based on ACC/AHA guidelines, patient would be at acceptable risk for the planned procedure without further cardiovascular testing.   The patient was advised that if she develops new symptoms prior to  surgery to contact our office to arrange for a follow-up visit, and she verbalized understanding.  Regarding ASA therapy, we recommend continuation of ASA throughout the perioperative period.  However, if the surgeon feels that cessation of ASA is required in the perioperative period, it may be stopped 5-7 days prior to surgery with a plan to resume it as soon as felt to be feasible from a surgical standpoint in the post-operative period.  A copy of this note will be routed to requesting surgeon.  Time:   Today, I have spent 5 minutes with the patient with telehealth technology discussing medical history, symptoms, and management plan.     Damien JAYSON Braver, NP  01/18/2024, 3:57 PM

## 2024-01-27 DIAGNOSIS — E1065 Type 1 diabetes mellitus with hyperglycemia: Secondary | ICD-10-CM | POA: Insufficient documentation

## 2024-02-01 ENCOUNTER — Ambulatory Visit
Admission: RE | Admit: 2024-02-01 | Discharge: 2024-02-01 | Disposition: A | Discharge status: Home / Self Care | Source: Ambulatory Visit | Attending: Physician Assistant | Admitting: Physician Assistant

## 2024-02-01 ENCOUNTER — Ambulatory Visit
Admission: RE | Admit: 2024-02-01 | Discharge: 2024-02-01 | Disposition: A | Source: Ambulatory Visit | Attending: Physician Assistant | Admitting: Physician Assistant

## 2024-02-01 DIAGNOSIS — M533 Sacrococcygeal disorders, not elsewhere classified: Secondary | ICD-10-CM

## 2024-02-01 MED ORDER — METHYLPREDNISOLONE ACETATE 80 MG/ML IJ SUSP
80.0000 mg | Freq: Once | INTRAMUSCULAR | Status: AC
  Administered 2024-02-01: 80 mg via INTRA_ARTICULAR

## 2024-02-09 NOTE — Progress Notes (Deleted)
 Office Visit Note  Patient: [name]             Date of Birth: Jul 16, 1960           MRN: [id number]             PCP: Benson Eleanor Rung, NP Referring: Thurmond Cathlyn DELENA., MD Visit Date: 02/23/2024 Occupation: Data Unavailable  Subjective:  No chief complaint on file.   History of Present Illness: [name] is a 63 y.o. female ***     Activities of Daily Living:  Patient reports morning stiffness for *** {minute/hour:19697}.   Patient {ACTIONS;DENIES/REPORTS:21021675::Denies} nocturnal pain.  Difficulty dressing/grooming: {ACTIONS;DENIES/REPORTS:21021675::Denies} Difficulty climbing stairs: {ACTIONS;DENIES/REPORTS:21021675::Denies} Difficulty getting out of chair: {ACTIONS;DENIES/REPORTS:21021675::Denies} Difficulty using hands for taps, buttons, cutlery, and/or writing: {ACTIONS;DENIES/REPORTS:21021675::Denies}  No Rheumatology ROS completed.   PMFS History:  Patient Active Problem List   Diagnosis Date Noted  . Mild intermittent asthma without complication 10/13/2023  . Sacroiliac joint pain 05/26/2023  . Acute pancreatitis with uninfected necrosis 04/15/2023  . History of pancreatitis 04/15/2023  . Liver cyst 03/24/2023  . Liver hemangioma 03/24/2023  . DOE (dyspnea on exertion) 05/16/2022  . Influenzal pneumonia 05/15/2022  . Osteoarthritis of right knee 12/07/2021  . Dermatographic urticaria 10/18/2021  . S/P lumbar laminectomy 09/18/2021  . Elevated parathyroid hormone 06/06/2021  . Osteoporosis of multiple sites without pathological fracture 06/06/2021  . Accelerating angina (HCC) 05/06/2021  . Complex regional pain syndrome of lower limb 03/07/2021  . Pelvic pain 12/14/2020  . RUQ pain 11/09/2020  . Elevated alkaline phosphatase level 11/09/2020  . Uncomplicated type 1 diabetes mellitus (HCC) 10/22/2020  . Lipoma of left upper extremity 09/04/2020  . Irritable bowel syndrome 09/03/2020  . Idiopathic peripheral neuropathy  08/29/2020  . Lipoma of abdominal wall 08/08/2020  . Fever 07/18/2020  . Hypotension 07/18/2020  . Sepsis (HCC) 07/18/2020  . Pneumonia 07/18/2020  . Arthritis 07/18/2020  . Blurred vision 07/11/2020  . Facial tingling 07/11/2020  . Diabetes mellitus without complication (HCC)   . Acute left ankle pain 03/09/2020  . History of pneumonia 01/25/2020  . History of sepsis 01/25/2020  . Respiratory failure (HCC) 01/13/2020  . HAP (hospital-acquired pneumonia) 01/13/2020  . Fusion of lumbar spine 01/11/2020  . Trigger thumb of right hand 11/14/2019  . Low back pain 10/22/2019  . Obsessive-compulsive disorder 10/14/2019  . Diabetes mellitus due to underlying condition with unspecified complications (HCC) 09/15/2019  . Primary osteoarthritis involving multiple joints 08/24/2019  . Alcohol use 04/15/2019  . Elevated LFTs 03/10/2019  . Lumbar post-laminectomy syndrome 03/04/2019  . Acute bilateral thoracic back pain 12/03/2018  . Pain of left hip joint 07/23/2018  . Polyarthralgia 06/11/2018  . Bursitis of both hips 04/14/2018  . Left foot pain 01/28/2018  . CAD (coronary artery disease) 12/24/2017  . Secondary diabetes mellitus (HCC) 12/24/2017  . Coronary arteriosclerosis 12/24/2017  . Dyspnea on exertion 12/04/2017  . Attention deficit 12/04/2017  . Intermittent claudication 12/04/2017  . History of metabolic disorder 12/04/2017  . Lung mass 12/04/2017  . Myositis 12/04/2017  . Erythrocytosis 12/04/2017  . Senile purpura 12/04/2017  . Chest pain 11/06/2017  . Multiple actinic keratoses 11/06/2017  . Degeneration of lumbar intervertebral disc 11/06/2017  . Fatigue 11/06/2017  . Insulin  long-term use (HCC) 11/06/2017  . Menopausal disorder 11/06/2017  . Purpura 11/06/2017  . Moderate chronic obstructive pulmonary disease (HCC) 11/06/2017  . Folic acid  deficiency 11/06/2017  . Malaise and fatigue 11/06/2017  . Arteriosclerotic vascular disease 11/06/2017  .  Dysphagia  11/04/2017  . Sciatica 11/03/2017  . Scoliosis deformity of spine 11/03/2017  . Lumbar radiculopathy 05/26/2017  . Personal history of (healed) traumatic fracture 12/31/2016  . Deficiency of other specified B group vitamins 11/07/2016  . Mixed hyperlipidemia 04/09/2016  . Smoking greater than 30 pack years 02/26/2016  . Cigarette smoker 02/26/2016  . Nicotine dependence, cigarettes, uncomplicated 02/26/2016  . Diabetes mellitus type 1, uncomplicated (HCC) 02/11/2016  . Tobacco abuse 02/11/2016  . Tobacco user 02/11/2016  . Pure hypercholesterolemia 02/11/2016  . Atherosclerosis of native coronary artery of native heart without angina pectoris 02/11/2016  . Type 1 diabetes mellitus without complication 02/11/2016  . Coronary artery disease of native artery of native heart with stable angina pectoris 01/25/2016  . Abnormal thyroid  function test 12/06/2015  . Chronic bronchitis (HCC) 12/04/2015  . Overweight with body mass index (BMI) 25.0-29.9 07/11/2015  . BMI 27.0-27.9,adult 07/11/2015  . Pain in both lower extremities 07/10/2015  . Anxiety disorder 06/05/2015  . Atherosclerosis 06/05/2015  . Hemangioma 06/05/2015  . Primary insomnia 06/05/2015  . Trichotillomania 06/05/2015  . HNP (herniated nucleus pulposus), lumbar 05/10/2014    Past Medical History:  Diagnosis Date  . Abnormal thyroid  function test 12/06/2015  . Accelerating angina (HCC) 05/06/2021  . Acute bilateral thoracic back pain 12/03/2018  . Acute left ankle pain 03/09/2020  . Alcohol use 04/15/2019   Formatting of this note might be different from the original. Recommend abstinence from alcohol  . Anxiety disorder 06/05/2015   Last Assessment & Plan:  Formatting of this note might be different from the original. Had started her on some xanax  about week ago and she feels it has helped her and she is taking about half tablet for this and will continue with it as she has been compliant with it  . Arteriosclerotic  vascular disease 11/06/2017  . Arthritis    back   . Atherosclerosis 06/05/2015   Formatting of this note might be different from the original. Seen on CTs of chest.  . Atherosclerosis of native coronary artery of native heart without angina pectoris 02/11/2016  . Attention deficit 12/04/2017  . Blurred vision 07/11/2020  . BMI 27.0-27.9,adult 07/11/2015   Last Assessment & Plan:  Formatting of this note might be different from the original. Relevant Hx: Course: Daily Update: Today's Plan:difficult for her with the insulin  as well as her abilify and the weight is increaseing for her rather than decreasing, she has cut her alcohol intake back, she has worked on her diet more with decreasing her carb intake as well and still difficulty discussed with   . Bursitis of both hips 04/14/2018  . CAD (coronary artery disease) 12/24/2017  . Chest pain 11/06/2017  . Chronic bronchitis (HCC) 12/04/2015   Formatting of this note might be different from the original. PFT ratio 88% FEV1 103% FVC 94% DLCO 76%  Last Assessment & Plan:  Formatting of this note might be different from the original. We discussed this as she has been coughing again for about 2 mnths and she sounds congested on exam with previous ease for pneumonia will place her on levaquin and have reviewed with her the importance of her   . Cigarette smoker 02/26/2016  . Complex regional pain syndrome of lower limb 03/07/2021  . Coronary arteriosclerosis 12/24/2017  . Coronary artery disease of native artery of native heart with stable angina pectoris 01/25/2016   Formatting of this note might be different from the original. 2017:  50% LAD lesion.  Calcium noted other places.  Heart cath December 31, 2017 with a 30% ostial LAD lesion otherwise normal coronary arteries seen with LVEF 55 to 65%  . Deficiency of other specified B group vitamins 11/07/2016  . Degeneration of lumbar intervertebral disc 11/06/2017  . Diabetes mellitus due to underlying  condition with unspecified complications (HCC) 09/15/2019  . Diabetes mellitus type 1, uncomplicated (HCC) 02/11/2016   Added automatically from request for surgery [id number]  Formatting of this note might be different from the original. Added automatically from request for surgery [phone]  . Diabetes mellitus without complication (HCC)    Type 1- injections only- recent change to Toujeo  with improved A1C reading.  SABRA Dysphagia 11/04/2017  . Dyspnea on exertion 12/04/2017  . Elevated alkaline phosphatase level 11/09/2020  . Elevated LFTs 03/10/2019   Formatting of this note might be different from the original. History.  Cannot change to this.  . Elevated parathyroid hormone 06/06/2021  . Erythrocytosis 12/04/2017  . Facial tingling 07/11/2020  . Fatigue 11/06/2017  . Fever 07/18/2020  . Folic acid  deficiency 11/06/2017  . Fusion of lumbar spine 01/11/2020  . HAP (hospital-acquired pneumonia) 01/13/2020  . Hemangioma 11/06/2017  . History of metabolic disorder 12/04/2017  . History of pneumonia 01/25/2020   Formatting of this note might be different from the original. Recent mutifocal pneumonia September 2021 Formatting of this note might be different from the original. Formatting of this note might be different from the original. Recent mutifocal pneumonia September 2021  . History of sepsis 01/25/2020   Formatting of this note might be different from the original. Recent septic syndrome with multifocal pneumonia Formatting of this note might be different from the original. Formatting of this note might be different from the original. Recent septic syndrome with multifocal pneumonia  . HNP (herniated nucleus pulposus), lumbar 05/10/2014  . Hypotension 07/18/2020  . Idiopathic peripheral neuropathy 08/29/2020  . Insulin  long-term use (HCC) 11/06/2017  . Intermittent claudication 12/04/2017  . Irritable bowel syndrome 09/03/2020  . Left foot pain 01/28/2018  . Lipoma of abdominal wall  08/08/2020  . Lipoma of left upper extremity 09/04/2020  . Low back pain 10/22/2019  . Lumbar post-laminectomy syndrome 03/04/2019  . Lumbar radiculopathy 05/26/2017  . Lung mass 12/04/2017  . Malaise and fatigue 11/06/2017  . Menopausal disorder 11/06/2017  . Mixed hyperlipidemia 04/09/2016   Formatting of this note might be different from the original. Added automatically from request for surgery [id number]  . Moderate chronic obstructive pulmonary disease (HCC) 11/06/2017  . Multiple actinic keratoses 11/06/2017  . Myositis 12/04/2017  . Nicotine dependence, cigarettes, uncomplicated 02/26/2016  . Obsessive-compulsive disorder 10/14/2019   Formatting of this note might be different from the original. Formerly followed with [facility].  . Osteoporosis of multiple sites without pathological fracture 06/06/2021  . Overweight with body mass index (BMI) 25.0-29.9 07/11/2015   Last Assessment & Plan:  Relevant Hx: Course: Daily Update: Today's Plan:difficult for her with the insulin  as well as her abilify and the weight is increaseing for her rather than decreasing, she has cut her alcohol intake back, she has worked on her diet more with decreasing her carb intake as well and still difficulty discussed with the saxenda and will try to get this approved for her and see   . Pain in both lower extremities 07/10/2015   Last Assessment & Plan:  Formatting of this note might be different from the original. Relevant Hx: Course: Daily Update: Today's Plan:she had pain to her right  anterior thigh there is edema of both legs and she has fear for DVT which she is going to have US  of and confirm ok, she has increased her weight which is part of her edema I believe as well as her insulin  usage. She is advised as well tha  . Pain of left hip joint 07/23/2018  . Pelvic pain 12/14/2020  . Personal history of (healed) traumatic fracture 12/31/2016  . Pneumonia   . Polyarthralgia 06/11/2018  . Primary insomnia  06/05/2015  . Primary osteoarthritis involving multiple joints 08/24/2019  . Pure hypercholesterolemia 02/11/2016  . Purpura 11/06/2017  . Respiratory failure (HCC) 11/2018  . RUQ pain 11/09/2020  . S/P lumbar laminectomy 09/18/2021  . Sciatica 11/03/2017  . Scoliosis deformity of spine 11/03/2017  . Secondary diabetes mellitus (HCC) 12/24/2017  . Senile purpura 12/04/2017  . Sepsis (HCC) 07/18/2020  . Smoking greater than 30 pack years 02/26/2016  . Tobacco abuse 02/11/2016  . Tobacco user 02/11/2016  . Trichotillomania    hx.   . Trigger thumb of right hand 11/14/2019  . Uncomplicated type 1 diabetes mellitus (HCC) 10/22/2020    Family History  Problem Relation Age of Onset  . Diabetes Mother   . Cancer Mother   . Hypertension Father   . Heart disease Father   . Diabetes Father   . Healthy Brother   . Healthy Son   . Diabetes Son   . Healthy Daughter    Past Surgical History:  Procedure Laterality Date  . ABDOMINAL EXPOSURE N/A 01/11/2020   Procedure: ABDOMINAL EXPOSURE;  Surgeon: Gretta Lonni PARAS, MD;  Location: [facility] OR;  Service: Vascular;  Laterality: N/A;  . ANTERIOR LUMBAR FUSION N/A 01/11/2020   Procedure: ANTERIOR LUMBAR INTERBODY FUSION  LUMBAR FIVE-SACRAL ONE, LEFT LUMBAR TWO-THREE FORAMINOTOMY, DISECTOMY;  Surgeon: Burnetta Aures, MD;  Location: [facility] OR;  Service: Orthopedics;  Laterality: N/A;  . APPENDECTOMY  04/28/1970  . BACK SURGERY  2016  . CARDIAC CATHETERIZATION  12/31/2017  . COLONOSCOPY  04/28/2017  . CORONARY PRESSURE/FFR STUDY N/A 05/06/2021   Procedure: INTRAVASCULAR PRESSURE WIRE/FFR STUDY;  Surgeon: Mady Lonni, MD;  Location: [facility] INVASIVE CV LAB;  Service: Cardiovascular;  Laterality: N/A;  . EXCISION/RELEASE BURSA HIP Left 04/14/2018   Procedure: Excision trochanteric bursa left hip;  Surgeon: Duwayne Purchase, MD;  Location: [facility] ORS;  Service: Orthopedics;  Laterality: Left;  60 mins  . HAND SURGERY    . HIP OPEN REDUCTION Right    ORIF  .  KNEE ARTHROSCOPY    . LEFT HEART CATH AND CORONARY ANGIOGRAPHY N/A 12/31/2017   Procedure: LEFT HEART CATH AND CORONARY ANGIOGRAPHY;  Surgeon: Swazil[place], Peter M, MD;  Location: [facility] INVASIVE CV LAB;  Service: Cardiovascular;  Laterality: N/A;  . LEFT HEART CATH AND CORONARY ANGIOGRAPHY N/A 05/06/2021   Procedure: LEFT HEART CATH AND CORONARY ANGIOGRAPHY;  Surgeon: Mady Lonni, MD;  Location: [facility] INVASIVE CV LAB;  Service: Cardiovascular;  Laterality: N/A;  . LUMBAR LAMINECTOMY/DECOMPRESSION MICRODISCECTOMY Left 05/10/2014   Procedure: MICRODISCECTOMY LUMBAR DECOMPRESSION L5-S1 LEFT ;  Surgeon: Purchase JAYSON Duwayne, MD;  Location: [facility] ORS;  Service: Orthopedics;  Laterality: Left;  . LUMBAR LAMINECTOMY/DECOMPRESSION MICRODISCECTOMY N/A 01/11/2020   Procedure: LUMBAR LAMINECTOMY/DECOMPRESSION MICRODISCECTOMY LEFT LUMBAR TWO-THREE;  Surgeon: Burnetta Aures, MD;  Location: [facility] OR;  Service: Orthopedics;  Laterality: N/A;  . LUMBAR LAMINECTOMY/DECOMPRESSION MICRODISCECTOMY Right 09/18/2021   Procedure: Laminectomy and Foraminotomy - Lumbar Four-Lumbar Five - right;  Surgeon: Joshua Alm RAMAN, MD;  Location: [facility] OR;  Service: Neurosurgery;  Laterality: Right;  Laminectomy and Foraminotomy - Lumbar Four-Lumbar Five - right  . ORIF WRIST FRACTURE Right    retained hardware  . ORTHOPAEDIC SURGERY  04/14/2018  . TUBAL LIGATION  12/25/1995   Social History   Tobacco Use  . Smoking status: Every Day    Current packs/day: 1.00    Average packs/day: 1 pack/day for 46.4 years (46.4 ttl pk-yrs)    Types: Cigarettes    Start date: 09/24/1977    Passive exposure: Current  . Smokeless tobacco: Never  . Tobacco comments:    Pt states 0.5 ppd.   Vaping Use  . Vaping status: Never Used  Substance Use Topics  . Alcohol use: Yes    Comment: Occassional  . Drug use: No   Social History   Social History Narrative   ** Merged History Encounter **         Immunization History  Administered Date(s) Administered  .  Influenza,inj,Quad PF,6+ Mos 01/21/2016, 12/31/2016, 02/09/2018, 01/18/2021  . Influenza,inj,quad, With Preservative 12/27/2016  . Influenza-Unspecified 01/21/2016, 12/31/2016  . Moderna Sars-Covid-2 Vaccination 04/29/2019, 05/30/2019  . Pneumococcal Conjugate-13 07/23/2017  . Pneumococcal Polysaccharide-23 02/01/2021  . Unspecified SARS-COV-2 Vaccination 02/28/2020     Objective: Vital Signs: LMP 05/04/2008    Physical Exam   Musculoskeletal Exam: ***  CDAI Exam: CDAI Score: -- Patient Global: --; Provider Global: -- Swollen: --; Tender: -- Joint Exam 02/23/2024   No joint exam has been documented for this visit   There is currently no information documented on the homunculus. Go to the Rheumatology activity and complete the homunculus joint exam.  Investigation: No additional findings.  Imaging: CT BIOPSY Result Date: 02/01/2024 CLINICAL DATA:  63 year old female with right-sided sacroiliac joint dysfunction EXAM: RIGHT CT GUIDED SI JOINT INJECTION RADIATION DOSE REDUCTION: This exam was performed according to the departmental dose-optimization program which includes automated exposure control, adjustment of the mA and/or kV according to patient size and/or use of iterative reconstruction technique. PROCEDURE: After a thorough discussion of risks and benefits of the procedure, including bleeding, infection, injury to nerves, blood vessels, and adjacent structures, verbal and written consent was obtained. Specific risks of the procedure included nondiagnostic/nontherapeutic injection and non target injection. The patient was placed prone on the CT table and localization was performed over the sacrum. Target site marked using CT guidance. The skin was prepped and draped in the usual sterile fashion using Betadine soap. After local anesthesia with 1% lidocaine  without epinephrine  and subsequent deep anesthesia, a 3-1/2 inch curved 22 gauge spinal needle was advanced into the right SI  joint under intermittent CT guidance. Once the needle was in satisfactory position, representative image was captured with the needle demonstrated in the sacroiliac joint. Subsequently, 80 mg Depo-Medrol  with 1 mL 0.5% bupivacaine  was injected into the right SI joint. Needles removed and a sterile dressing applied. No complications were observed. IMPRESSION: Successful CT-guided right SI joint injection. Electronically Signed   By: Wilkie Lent M.D.   On: 02/01/2024 09:25    Recent Labs: Lab Results  Component Value Date   WBC 7.5 10/09/2023   HGB 13.6 10/09/2023   PLT 235 10/09/2023   NA 138 11/10/2023   K 4.3 11/10/2023   CL 100 11/10/2023   CO2 32 11/10/2023   GLUCOSE 193 (H) 11/10/2023   BUN 20 11/10/2023   CREATININE 0.65 11/10/2023   BILITOT 0.9 11/10/2023   ALKPHOS 122 (H) 10/09/2023   AST 13 11/10/2023   ALT 15 11/10/2023   PROT  6.0 (L) 11/10/2023   ALBUMIN  4.2 10/09/2023   CALCIUM 9.7 11/10/2023   GFRAA >60 01/21/2020    Speciality Comments: Avoid oral bisphosphonates-history of GER [place]  started September 26, 2021  Procedures:  No procedures performed Allergies: Celebrex [celecoxib] and Mobic [meloxicam]   Assessment / Plan:     Visit Diagnoses: No diagnosis found.  Orders: No orders of the defined types were placed in this encounter.  No orders of the defined types were placed in this encounter.   Face-to-face time spent with patient was *** minutes. Greater than 50% of time was spent in counseling and coordination of care.  Follow-Up Instructions: No follow-ups on file.   Daved JAYSON Gavel, CMA  Note - This record has been created using Animal nutritionist.  Chart creation errors have been sought, but may not always  have been located. Such creation errors do not reflect on  the standard of medical care.

## 2024-02-11 ENCOUNTER — Telehealth: Payer: Self-pay | Admitting: Cardiology

## 2024-02-11 NOTE — Telephone Encounter (Signed)
 Patient c/o Palpitations: STAT if patient c/o lightheadedness, shortness of breath, or chest pain  How long have you had palpitations/irregular HR/ Afib? Are you having the symptoms now?  Felt fluttering in left chest all day yesterday. No symptoms today. Normal rhythm per [employer].  Are you currently experiencing lightheadedness, SOB or CP?  No   Do you have a history of afib (atrial fibrillation) or irregular heart rhythm?  No   Have you checked your BP or HR? (document readings if available):  HR was in the 60's all day yesterday. Hasn't checked BP, but patient says it normally remains low.  Are you experiencing any other symptoms?  No

## 2024-02-15 ENCOUNTER — Other Ambulatory Visit (HOSPITAL_COMMUNITY): Payer: Self-pay

## 2024-02-23 ENCOUNTER — Ambulatory Visit: Admitting: Rheumatology

## 2024-02-23 DIAGNOSIS — M4326 Fusion of spine, lumbar region: Secondary | ICD-10-CM

## 2024-02-23 DIAGNOSIS — M19042 Primary osteoarthritis, left hand: Secondary | ICD-10-CM

## 2024-02-23 DIAGNOSIS — F633 Trichotillomania: Secondary | ICD-10-CM

## 2024-02-23 DIAGNOSIS — F17210 Nicotine dependence, cigarettes, uncomplicated: Secondary | ICD-10-CM

## 2024-02-23 DIAGNOSIS — Z8659 Personal history of other mental and behavioral disorders: Secondary | ICD-10-CM

## 2024-02-23 DIAGNOSIS — Z5181 Encounter for therapeutic drug level monitoring: Secondary | ICD-10-CM

## 2024-02-23 DIAGNOSIS — M51369 Other intervertebral disc degeneration, lumbar region without mention of lumbar back pain or lower extremity pain: Secondary | ICD-10-CM

## 2024-02-23 DIAGNOSIS — Z8719 Personal history of other diseases of the digestive system: Secondary | ICD-10-CM

## 2024-02-23 DIAGNOSIS — E559 Vitamin D deficiency, unspecified: Secondary | ICD-10-CM

## 2024-02-23 DIAGNOSIS — L570 Actinic keratosis: Secondary | ICD-10-CM

## 2024-02-23 DIAGNOSIS — Z8619 Personal history of other infectious and parasitic diseases: Secondary | ICD-10-CM

## 2024-02-23 DIAGNOSIS — M1712 Unilateral primary osteoarthritis, left knee: Secondary | ICD-10-CM

## 2024-02-23 DIAGNOSIS — I7090 Unspecified atherosclerosis: Secondary | ICD-10-CM

## 2024-02-23 DIAGNOSIS — G8929 Other chronic pain: Secondary | ICD-10-CM

## 2024-02-23 DIAGNOSIS — M810 Age-related osteoporosis without current pathological fracture: Secondary | ICD-10-CM

## 2024-02-23 DIAGNOSIS — M1711 Unilateral primary osteoarthritis, right knee: Secondary | ICD-10-CM

## 2024-02-23 DIAGNOSIS — E109 Type 1 diabetes mellitus without complications: Secondary | ICD-10-CM

## 2024-02-23 DIAGNOSIS — E7800 Pure hypercholesterolemia, unspecified: Secondary | ICD-10-CM

## 2024-02-23 DIAGNOSIS — I25118 Atherosclerotic heart disease of native coronary artery with other forms of angina pectoris: Secondary | ICD-10-CM

## 2024-02-23 DIAGNOSIS — J449 Chronic obstructive pulmonary disease, unspecified: Secondary | ICD-10-CM

## 2024-02-23 NOTE — Progress Notes (Unsigned)
 Office Visit Note  Patient: [name]             Date of Birth: 1960-10-05           MRN: [id number]             PCP: Benson Eleanor Rung, NP Referring: Thurmond Cathlyn DELENA., MD Visit Date: 02/24/2024 Occupation: Data Unavailable  Subjective:  No chief complaint on file.   History of Present Illness: [name] is a 63 y.o. female ***     Activities of Daily Living:  Patient reports morning stiffness for *** {minute/hour:19697}.   Patient {ACTIONS;DENIES/REPORTS:21021675::Denies} nocturnal pain.  Difficulty dressing/grooming: {ACTIONS;DENIES/REPORTS:21021675::Denies} Difficulty climbing stairs: {ACTIONS;DENIES/REPORTS:21021675::Denies} Difficulty getting out of chair: {ACTIONS;DENIES/REPORTS:21021675::Denies} Difficulty using hands for taps, buttons, cutlery, and/or writing: {ACTIONS;DENIES/REPORTS:21021675::Denies}  No Rheumatology ROS completed.   PMFS History:  Patient Active Problem List   Diagnosis Date Noted   Mild intermittent asthma without complication 10/13/2023   Sacroiliac joint pain 05/26/2023   Acute pancreatitis with uninfected necrosis 04/15/2023   History of pancreatitis 04/15/2023   Liver cyst 03/24/2023   Liver hemangioma 03/24/2023   DOE (dyspnea on exertion) 05/16/2022   Influenzal pneumonia 05/15/2022   Osteoarthritis of right knee 12/07/2021   Dermatographic urticaria 10/18/2021   S/P lumbar laminectomy 09/18/2021   Elevated parathyroid hormone 06/06/2021   Osteoporosis of multiple sites without pathological fracture 06/06/2021   Accelerating angina (HCC) 05/06/2021   Complex regional pain syndrome of lower limb 03/07/2021   Pelvic pain 12/14/2020   RUQ pain 11/09/2020   Elevated alkaline phosphatase level 11/09/2020   Uncomplicated type 1 diabetes mellitus (HCC) 10/22/2020   Lipoma of left upper extremity 09/04/2020   Irritable bowel syndrome 09/03/2020   Idiopathic peripheral neuropathy 08/29/2020   Lipoma of  abdominal wall 08/08/2020   Fever 07/18/2020   Hypotension 07/18/2020   Sepsis (HCC) 07/18/2020   Pneumonia 07/18/2020   Arthritis 07/18/2020   Blurred vision 07/11/2020   Facial tingling 07/11/2020   Diabetes mellitus without complication (HCC)    Acute left ankle pain 03/09/2020   History of pneumonia 01/25/2020   History of sepsis 01/25/2020   Respiratory failure (HCC) 01/13/2020   HAP (hospital-acquired pneumonia) 01/13/2020   Fusion of lumbar spine 01/11/2020   Trigger thumb of right hand 11/14/2019   Low back pain 10/22/2019   Obsessive-compulsive disorder 10/14/2019   Diabetes mellitus due to underlying condition with unspecified complications (HCC) 09/15/2019   Primary osteoarthritis involving multiple joints 08/24/2019   Alcohol use 04/15/2019   Elevated LFTs 03/10/2019   Lumbar post-laminectomy syndrome 03/04/2019   Acute bilateral thoracic back pain 12/03/2018   Pain of left hip joint 07/23/2018   Polyarthralgia 06/11/2018   Bursitis of both hips 04/14/2018   Left foot pain 01/28/2018   CAD (coronary artery disease) 12/24/2017   Secondary diabetes mellitus (HCC) 12/24/2017   Coronary arteriosclerosis 12/24/2017   Dyspnea on exertion 12/04/2017   Attention deficit 12/04/2017   Intermittent claudication 12/04/2017   History of metabolic disorder 12/04/2017   Lung mass 12/04/2017   Myositis 12/04/2017   Erythrocytosis 12/04/2017   Senile purpura 12/04/2017   Chest pain 11/06/2017   Multiple actinic keratoses 11/06/2017   Degeneration of lumbar intervertebral disc 11/06/2017   Fatigue 11/06/2017   Insulin  long-term use (HCC) 11/06/2017   Menopausal disorder 11/06/2017   Purpura 11/06/2017   Moderate chronic obstructive pulmonary disease (HCC) 11/06/2017   Folic acid  deficiency 11/06/2017   Malaise and fatigue 11/06/2017   Arteriosclerotic vascular disease 11/06/2017  Dysphagia 11/04/2017   Sciatica 11/03/2017   Scoliosis deformity of spine 11/03/2017    Lumbar radiculopathy 05/26/2017   Personal history of (healed) traumatic fracture 12/31/2016   Deficiency of other specified B group vitamins 11/07/2016   Mixed hyperlipidemia 04/09/2016   Smoking greater than 30 pack years 02/26/2016   Cigarette smoker 02/26/2016   Nicotine dependence, cigarettes, uncomplicated 02/26/2016   Diabetes mellitus type 1, uncomplicated (HCC) 02/11/2016   Tobacco abuse 02/11/2016   Tobacco user 02/11/2016   Pure hypercholesterolemia 02/11/2016   Atherosclerosis of native coronary artery of native heart without angina pectoris 02/11/2016   Type 1 diabetes mellitus without complication 02/11/2016   Coronary artery disease of native artery of native heart with stable angina pectoris 01/25/2016   Abnormal thyroid  function test 12/06/2015   Chronic bronchitis (HCC) 12/04/2015   Overweight with body mass index (BMI) 25.0-29.9 07/11/2015   BMI 27.0-27.9,adult 07/11/2015   Pain in both lower extremities 07/10/2015   Anxiety disorder 06/05/2015   Atherosclerosis 06/05/2015   Hemangioma 06/05/2015   Primary insomnia 06/05/2015   Trichotillomania 06/05/2015   HNP (herniated nucleus pulposus), lumbar 05/10/2014    Past Medical History:  Diagnosis Date   Abnormal thyroid  function test 12/06/2015   Accelerating angina (HCC) 05/06/2021   Acute bilateral thoracic back pain 12/03/2018   Acute left ankle pain 03/09/2020   Alcohol use 04/15/2019   Formatting of this note might be different from the original. Recommend abstinence from alcohol   Anxiety disorder 06/05/2015   Last Assessment & Plan:  Formatting of this note might be different from the original. Had started her on some xanax  about week ago and she feels it has helped her and she is taking about half tablet for this and will continue with it as she has been compliant with it   Arteriosclerotic vascular disease 11/06/2017   Arthritis    back    Atherosclerosis 06/05/2015   Formatting of this note might be  different from the original. Seen on CTs of chest.   Atherosclerosis of native coronary artery of native heart without angina pectoris 02/11/2016   Attention deficit 12/04/2017   Blurred vision 07/11/2020   BMI 27.0-27.9,adult 07/11/2015   Last Assessment & Plan:  Formatting of this note might be different from the original. Relevant Hx: Course: Daily Update: Today's Plan:difficult for her with the insulin  as well as her abilify and the weight is increaseing for her rather than decreasing, she has cut her alcohol intake back, she has worked on her diet more with decreasing her carb intake as well and still difficulty discussed with    Bursitis of both hips 04/14/2018   CAD (coronary artery disease) 12/24/2017   Chest pain 11/06/2017   Chronic bronchitis (HCC) 12/04/2015   Formatting of this note might be different from the original. PFT ratio 88% FEV1 103% FVC 94% DLCO 76%  Last Assessment & Plan:  Formatting of this note might be different from the original. We discussed this as she has been coughing again for about 2 mnths and she sounds congested on exam with previous ease for pneumonia will place her on levaquin and have reviewed with her the importance of her    Cigarette smoker 02/26/2016   Complex regional pain syndrome of lower limb 03/07/2021   Coronary arteriosclerosis 12/24/2017   Coronary artery disease of native artery of native heart with stable angina pectoris 01/25/2016   Formatting of this note might be different from the original. 2017:  50% LAD lesion.  Calcium noted other places.  Heart cath December 31, 2017 with a 30% ostial LAD lesion otherwise normal coronary arteries seen with LVEF 55 to 65%   Deficiency of other specified B group vitamins 11/07/2016   Degeneration of lumbar intervertebral disc 11/06/2017   Diabetes mellitus due to underlying condition with unspecified complications (HCC) 09/15/2019   Diabetes mellitus type 1, uncomplicated (HCC) 02/11/2016   Added  automatically from request for surgery [id number]  Formatting of this note might be different from the original. Added automatically from request for surgery [id number]   Diabetes mellitus without complication (HCC)    Type 1- injections only- recent change to [place]  with improved A1C reading.   Dysphagia 11/04/2017   Dyspnea on exertion 12/04/2017   Elevated alkaline phosphatase level 11/09/2020   Elevated LFTs 03/10/2019   Formatting of this note might be different from the original. History.  Cannot change to this.   Elevated parathyroid hormone 06/06/2021   Erythrocytosis 12/04/2017   Facial tingling 07/11/2020   Fatigue 11/06/2017   Fever 07/18/2020   Folic acid  deficiency 11/06/2017   Fusion of lumbar spine 01/11/2020   HAP (hospital-acquired pneumonia) 01/13/2020   Hemangioma 11/06/2017   History of metabolic disorder 12/04/2017   History of pneumonia 01/25/2020   Formatting of this note might be different from the original. Recent mutifocal pneumonia September 2021 Formatting of this note might be different from the original. Formatting of this note might be different from the original. Recent mutifocal pneumonia September 2021   History of sepsis 01/25/2020   Formatting of this note might be different from the original. Recent septic syndrome with multifocal pneumonia Formatting of this note might be different from the original. Formatting of this note might be different from the original. Recent septic syndrome with multifocal pneumonia   HNP (herniated nucleus pulposus), lumbar 05/10/2014   Hypotension 07/18/2020   Idiopathic peripheral neuropathy 08/29/2020   Insulin  long-term use (HCC) 11/06/2017   Intermittent claudication 12/04/2017   Irritable bowel syndrome 09/03/2020   Left foot pain 01/28/2018   Lipoma of abdominal wall 08/08/2020   Lipoma of left upper extremity 09/04/2020   Low back pain 10/22/2019   Lumbar post-laminectomy syndrome 03/04/2019   Lumbar radiculopathy  05/26/2017   Lung mass 12/04/2017   Malaise and fatigue 11/06/2017   Menopausal disorder 11/06/2017   Mixed hyperlipidemia 04/09/2016   Formatting of this note might be different from the original. Added automatically from request for surgery [id number]   Moderate chronic obstructive pulmonary disease (HCC) 11/06/2017   Multiple actinic keratoses 11/06/2017   Myositis 12/04/2017   Nicotine dependence, cigarettes, uncomplicated 02/26/2016   Obsessive-compulsive disorder 10/14/2019   Formatting of this note might be different from the original. Formerly followed with [facility].   Osteoporosis of multiple sites without pathological fracture 06/06/2021   Overweight with body mass index (BMI) 25.0-29.9 07/11/2015   Last Assessment & Plan:  Relevant Hx: Course: Daily Update: Today's Plan:difficult for her with the insulin  as well as her abilify and the weight is increaseing for her rather than decreasing, she has cut her alcohol intake back, she has worked on her diet more with decreasing her carb intake as well and still difficulty discussed with the saxenda and will try to get this approved for her and see    Pain in both lower extremities 07/10/2015   Last Assessment & Plan:  Formatting of this note might be different from the original. Relevant Hx: Course: Daily Update: Today's Plan:she had pain to her right  anterior thigh there is edema of both legs and she has fear for DVT which she is going to have US  of and confirm ok, she has increased her weight which is part of her edema I believe as well as her insulin  usage. She is advised as well tha   Pain of left hip joint 07/23/2018   Pelvic pain 12/14/2020   Personal history of (healed) traumatic fracture 12/31/2016   Pneumonia    Polyarthralgia 06/11/2018   Primary insomnia 06/05/2015   Primary osteoarthritis involving multiple joints 08/24/2019   Pure hypercholesterolemia 02/11/2016   Purpura 11/06/2017   Respiratory failure (HCC) 11/2018   RUQ  pain 11/09/2020   S/P lumbar laminectomy 09/18/2021   Sciatica 11/03/2017   Scoliosis deformity of spine 11/03/2017   Secondary diabetes mellitus (HCC) 12/24/2017   Senile purpura 12/04/2017   Sepsis (HCC) 07/18/2020   Smoking greater than 30 pack years 02/26/2016   Tobacco abuse 02/11/2016   Tobacco user 02/11/2016   Trichotillomania    hx.    Trigger thumb of right hand 11/14/2019   Uncomplicated type 1 diabetes mellitus (HCC) 10/22/2020    Family History  Problem Relation Age of Onset   Diabetes Mother    Cancer Mother    Hypertension Father    Heart disease Father    Diabetes Father    Healthy Brother    Healthy Son    Diabetes Son    Healthy Daughter    Past Surgical History:  Procedure Laterality Date   ABDOMINAL EXPOSURE N/A 01/11/2020   Procedure: ABDOMINAL EXPOSURE;  Surgeon: Gretta Lonni PARAS, MD;  Location: [facility] OR;  Service: Vascular;  Laterality: N/A;   ANTERIOR LUMBAR FUSION N/A 01/11/2020   Procedure: ANTERIOR LUMBAR INTERBODY FUSION  LUMBAR FIVE-SACRAL ONE, LEFT LUMBAR TWO-THREE FORAMINOTOMY, DISECTOMY;  Surgeon: Burnetta Aures, MD;  Location: [facility] OR;  Service: Orthopedics;  Laterality: N/A;   APPENDECTOMY  04/28/1970   BACK SURGERY  2016   CARDIAC CATHETERIZATION  12/31/2017   COLONOSCOPY  04/28/2017   CORONARY PRESSURE/FFR STUDY N/A 05/06/2021   Procedure: INTRAVASCULAR PRESSURE WIRE/FFR STUDY;  Surgeon: Mady Lonni, MD;  Location: [facility] INVASIVE CV LAB;  Service: Cardiovascular;  Laterality: N/A;   EXCISION/RELEASE BURSA HIP Left 04/14/2018   Procedure: Excision trochanteric bursa left hip;  Surgeon: Duwayne Purchase, MD;  Location: [facility] ORS;  Service: Orthopedics;  Laterality: Left;  60 mins   HAND SURGERY     HIP OPEN REDUCTION Right    ORIF   KNEE ARTHROSCOPY     LEFT HEART CATH AND CORONARY ANGIOGRAPHY N/A 12/31/2017   Procedure: LEFT HEART CATH AND CORONARY ANGIOGRAPHY;  Surgeon: Jordan, Peter M, MD;  Location: [facility] INVASIVE CV LAB;  Service: Cardiovascular;   Laterality: N/A;   LEFT HEART CATH AND CORONARY ANGIOGRAPHY N/A 05/06/2021   Procedure: LEFT HEART CATH AND CORONARY ANGIOGRAPHY;  Surgeon: Mady Lonni, MD;  Location: [facility] INVASIVE CV LAB;  Service: Cardiovascular;  Laterality: N/A;   LUMBAR LAMINECTOMY/DECOMPRESSION MICRODISCECTOMY Left 05/10/2014   Procedure: MICRODISCECTOMY LUMBAR DECOMPRESSION L5-S1 LEFT ;  Surgeon: Purchase JAYSON Duwayne, MD;  Location: [facility] ORS;  Service: Orthopedics;  Laterality: Left;   LUMBAR LAMINECTOMY/DECOMPRESSION MICRODISCECTOMY N/A 01/11/2020   Procedure: LUMBAR LAMINECTOMY/DECOMPRESSION MICRODISCECTOMY LEFT LUMBAR TWO-THREE;  Surgeon: Burnetta Aures, MD;  Location: [facility] OR;  Service: Orthopedics;  Laterality: N/A;   LUMBAR LAMINECTOMY/DECOMPRESSION MICRODISCECTOMY Right 09/18/2021   Procedure: Laminectomy and Foraminotomy - Lumbar Four-Lumbar Five - right;  Surgeon: Joshua Alm RAMAN, MD;  Location: [facility] OR;  Service: Neurosurgery;  Laterality: Right;  Laminectomy and Foraminotomy - Lumbar Four-Lumbar Five - right   ORIF WRIST FRACTURE Right    retained hardware   ORTHOPAEDIC SURGERY  04/14/2018   TUBAL LIGATION  12/25/1995   Social History   Tobacco Use   Smoking status: Every Day    Current packs/day: 1.00    Average packs/day: 1 pack/day for 46.4 years (46.4 ttl pk-yrs)    Types: Cigarettes    Start date: 09/24/1977    Passive exposure: Current   Smokeless tobacco: Never   Tobacco comments:    Pt states 0.5 ppd.   Vaping Use   Vaping status: Never Used  Substance Use Topics   Alcohol use: Yes    Comment: Occassional   Drug use: No   Social History   Social History Narrative   ** Merged History Encounter **         Immunization History  Administered Date(s) Administered   Influenza,inj,Quad PF,6+ Mos 01/21/2016, 12/31/2016, 02/09/2018, 01/18/2021   Influenza,inj,quad, With Preservative 12/27/2016   Influenza-Unspecified 01/21/2016, 12/31/2016   Moderna Sars-Covid-2 Vaccination 04/29/2019, 05/30/2019    Pneumococcal Conjugate-13 07/23/2017   Pneumococcal Polysaccharide-23 02/01/2021   Unspecified SARS-COV-2 Vaccination 02/28/2020     Objective: Vital Signs: LMP 05/04/2008    Physical Exam   Musculoskeletal Exam: ***  CDAI Exam: CDAI Score: -- Patient Global: --; Provider Global: -- Swollen: --; Tender: -- Joint Exam 02/24/2024   No joint exam has been documented for this visit   There is currently no information documented on the homunculus. Go to the Rheumatology activity and complete the homunculus joint exam.  Investigation: No additional findings.  Imaging: CT BIOPSY Result Date: 02/01/2024 CLINICAL DATA:  63 year old female with right-sided sacroiliac joint dysfunction EXAM: RIGHT CT GUIDED SI JOINT INJECTION RADIATION DOSE REDUCTION: This exam was performed according to the departmental dose-optimization program which includes automated exposure control, adjustment of the mA and/or kV according to patient size and/or use of iterative reconstruction technique. PROCEDURE: After a thorough discussion of risks and benefits of the procedure, including bleeding, infection, injury to nerves, blood vessels, and adjacent structures, verbal and written consent was obtained. Specific risks of the procedure included nondiagnostic/nontherapeutic injection and non target injection. The patient was placed prone on the CT table and localization was performed over the sacrum. Target site marked using CT guidance. The skin was prepped and draped in the usual sterile fashion using Betadine soap. After local anesthesia with 1% lidocaine  without epinephrine  and subsequent deep anesthesia, a 3-1/2 inch curved 22 gauge spinal needle was advanced into the right SI joint under intermittent CT guidance. Once the needle was in satisfactory position, representative image was captured with the needle demonstrated in the sacroiliac joint. Subsequently, 80 mg Depo-Medrol  with 1 mL 0.5% bupivacaine  was injected  into the right SI joint. Needles removed and a sterile dressing applied. No complications were observed. IMPRESSION: Successful CT-guided right SI joint injection. Electronically Signed   By: Wilkie Lent M.D.   On: 02/01/2024 09:25    Recent Labs: Lab Results  Component Value Date   WBC 7.5 10/09/2023   HGB 13.6 10/09/2023   PLT 235 10/09/2023   NA 138 11/10/2023   K 4.3 11/10/2023   CL 100 11/10/2023   CO2 32 11/10/2023   GLUCOSE 193 (H) 11/10/2023   BUN 20 11/10/2023   CREATININE 0.65 11/10/2023   BILITOT 0.9 11/10/2023   ALKPHOS 122 (H) 10/09/2023   AST 13 11/10/2023   ALT 15 11/10/2023   PROT  6.0 (L) 11/10/2023   ALBUMIN  4.2 10/09/2023   CALCIUM 9.7 11/10/2023   GFRAA >60 01/21/2020    Speciality Comments: Avoid oral bisphosphonates-history of GER [place]  started September 26, 2021  Procedures:  No procedures performed Allergies: Celebrex [celecoxib] and Mobic [meloxicam]   Assessment / Plan:     Visit Diagnoses: No diagnosis found.  Orders: No orders of the defined types were placed in this encounter.  No orders of the defined types were placed in this encounter.   Face-to-face time spent with patient was *** minutes. Greater than 50% of time was spent in counseling and coordination of care.  Follow-Up Instructions: No follow-ups on file.   [name], RT  Note - This record has been created using [employer].  Chart creation errors have been sought, but may not always  have been located. Such creation errors do not reflect on  the standard of medical care.

## 2024-02-24 ENCOUNTER — Ambulatory Visit: Attending: Physician Assistant | Admitting: Physician Assistant

## 2024-02-24 ENCOUNTER — Ambulatory Visit: Payer: Self-pay | Admitting: Physician Assistant

## 2024-02-24 ENCOUNTER — Ambulatory Visit

## 2024-02-24 ENCOUNTER — Encounter: Payer: Self-pay | Admitting: Physician Assistant

## 2024-02-24 VITALS — BP 105/72 | HR 75 | Temp 97.9°F | Resp 14 | Ht 62.0 in

## 2024-02-24 DIAGNOSIS — M542 Cervicalgia: Secondary | ICD-10-CM

## 2024-02-24 DIAGNOSIS — M19042 Primary osteoarthritis, left hand: Secondary | ICD-10-CM

## 2024-02-24 DIAGNOSIS — J449 Chronic obstructive pulmonary disease, unspecified: Secondary | ICD-10-CM

## 2024-02-24 DIAGNOSIS — Z8719 Personal history of other diseases of the digestive system: Secondary | ICD-10-CM

## 2024-02-24 DIAGNOSIS — Z8619 Personal history of other infectious and parasitic diseases: Secondary | ICD-10-CM

## 2024-02-24 DIAGNOSIS — E559 Vitamin D deficiency, unspecified: Secondary | ICD-10-CM

## 2024-02-24 DIAGNOSIS — M810 Age-related osteoporosis without current pathological fracture: Secondary | ICD-10-CM

## 2024-02-24 DIAGNOSIS — L570 Actinic keratosis: Secondary | ICD-10-CM

## 2024-02-24 DIAGNOSIS — F633 Trichotillomania: Secondary | ICD-10-CM

## 2024-02-24 DIAGNOSIS — M51369 Other intervertebral disc degeneration, lumbar region without mention of lumbar back pain or lower extremity pain: Secondary | ICD-10-CM

## 2024-02-24 DIAGNOSIS — Z8659 Personal history of other mental and behavioral disorders: Secondary | ICD-10-CM

## 2024-02-24 DIAGNOSIS — Z8261 Family history of arthritis: Secondary | ICD-10-CM

## 2024-02-24 DIAGNOSIS — G8929 Other chronic pain: Secondary | ICD-10-CM

## 2024-02-24 DIAGNOSIS — F17210 Nicotine dependence, cigarettes, uncomplicated: Secondary | ICD-10-CM

## 2024-02-24 DIAGNOSIS — M25511 Pain in right shoulder: Secondary | ICD-10-CM

## 2024-02-24 DIAGNOSIS — M25512 Pain in left shoulder: Secondary | ICD-10-CM

## 2024-02-24 DIAGNOSIS — M1711 Unilateral primary osteoarthritis, right knee: Secondary | ICD-10-CM

## 2024-02-24 DIAGNOSIS — E109 Type 1 diabetes mellitus without complications: Secondary | ICD-10-CM

## 2024-02-24 DIAGNOSIS — Z5181 Encounter for therapeutic drug level monitoring: Secondary | ICD-10-CM | POA: Diagnosis not present

## 2024-02-24 DIAGNOSIS — M19041 Primary osteoarthritis, right hand: Secondary | ICD-10-CM

## 2024-02-24 DIAGNOSIS — M1712 Unilateral primary osteoarthritis, left knee: Secondary | ICD-10-CM

## 2024-02-24 DIAGNOSIS — I25118 Atherosclerotic heart disease of native coronary artery with other forms of angina pectoris: Secondary | ICD-10-CM

## 2024-02-24 DIAGNOSIS — E7800 Pure hypercholesterolemia, unspecified: Secondary | ICD-10-CM

## 2024-02-24 DIAGNOSIS — M4326 Fusion of spine, lumbar region: Secondary | ICD-10-CM

## 2024-02-24 DIAGNOSIS — I7090 Unspecified atherosclerosis: Secondary | ICD-10-CM

## 2024-02-24 NOTE — Progress Notes (Signed)
 X-rays are consistent with early degenerative changes of the cervical spine.  No significant disc space narrowing noted.

## 2024-02-24 NOTE — Patient Instructions (Signed)
 Neck Exercises Ask your health care provider which exercises are safe for you. Do exercises exactly as told by your health care provider and adjust them as directed. It is normal to feel mild stretching, pulling, tightness, or discomfort as you do these exercises. Stop right away if you feel sudden pain or your pain gets worse. Do not begin these exercises until told by your health care provider. Neck exercises can be important for many reasons. They can improve strength and maintain flexibility in your neck, which will help your upper back and prevent neck pain. Stretching exercises Rotation neck stretching  Sit in a chair or stand up. Place your feet flat on the floor, shoulder-width apart. Slowly turn your head (rotate) to the right until a slight stretch is felt. Turn it all the way to the right so you can look over your right shoulder. Do not tilt or tip your head. Hold this position for 10-30 seconds. Slowly turn your head (rotate) to the left until a slight stretch is felt. Turn it all the way to the left so you can look over your left shoulder. Do not tilt or tip your head. Hold this position for 10-30 seconds. Repeat __________ times. Complete this exercise __________ times a day. Neck retraction  Sit in a sturdy chair or stand up. Look straight ahead. Do not bend your neck. Use your fingers to push your chin backward (retraction). Do not bend your neck for this movement. Continue to face straight ahead. If you are doing the exercise properly, you will feel a slight sensation in your throat and a stretch at the back of your neck. Hold the stretch for 1-2 seconds. Repeat __________ times. Complete this exercise __________ times a day. Strengthening exercises Neck press  Lie on your back on a firm bed or on the floor with a pillow under your head. Use your neck muscles to push your head down on the pillow and straighten your spine. Hold the position as well as you can. Keep your head  facing up (in a neutral position) and your chin tucked. Slowly count to 5 while holding this position. Repeat __________ times. Complete this exercise __________ times a day. Isometrics These are exercises in which you strengthen the muscles in your neck while keeping your neck still (isometrics). Sit in a supportive chair and place your hand on your forehead. Keep your head and face facing straight ahead. Do not flex or extend your neck while doing isometrics. Push forward with your head and neck while pushing back with your hand. Hold for 10 seconds. Do the sequence again, this time putting your hand against the back of your head. Use your head and neck to push backward against the hand pressure. Finally, do the same exercise on either side of your head, pushing sideways against the pressure of your hand. Repeat __________ times. Complete this exercise __________ times a day. Prone head lifts  Lie face-down (prone position), resting on your elbows so that your chest and upper back are raised. Start with your head facing downward, near your chest. Position your chin either on or near your chest. Slowly lift your head upward. Lift until you are looking straight ahead. Then continue lifting your head as far back as you can comfortably stretch. Hold your head up for 5 seconds. Then slowly lower it to your starting position. Repeat __________ times. Complete this exercise __________ times a day. Supine head lifts  Lie on your back (supine position), bending your knees  to point to the ceiling and keeping your feet flat on the floor. Lift your head slowly off the floor, raising your chin toward your chest. Hold for 5 seconds. Repeat __________ times. Complete this exercise __________ times a day. Scapular retraction  Stand with your arms at your sides. Look straight ahead. Slowly pull both shoulders (scapulae) backward and downward (retraction) until you feel a stretch between your shoulder  blades in your upper back. Hold for 10-30 seconds. Relax and repeat. Repeat __________ times. Complete this exercise __________ times a day. Contact a health care provider if: Your neck pain or discomfort gets worse when you do an exercise. Your neck pain or discomfort does not improve within 2 hours after you exercise. If you have any of these problems, stop exercising right away. Do not do the exercises again unless your health care provider says that you can. Get help right away if: You develop sudden, severe neck pain. If this happens, stop exercising right away. Do not do the exercises again unless your health care provider says that you can. This information is not intended to replace advice given to you by your health care provider. Make sure you discuss any questions you have with your health care provider. Document Revised: 10/09/2020 Document Reviewed: 10/09/2020 Elsevier Patient Education  2024 [employer].

## 2024-03-11 DIAGNOSIS — K760 Fatty (change of) liver, not elsewhere classified: Secondary | ICD-10-CM | POA: Insufficient documentation

## 2024-03-15 ENCOUNTER — Other Ambulatory Visit: Payer: Self-pay | Admitting: Cardiology

## 2024-03-16 ENCOUNTER — Telehealth: Payer: Self-pay

## 2024-03-16 NOTE — Telephone Encounter (Signed)
 Fax received from [facility] stating patient needs prior auth completed for Repatha , I will forward to our prior auth team for completion.

## 2024-03-17 ENCOUNTER — Other Ambulatory Visit (HOSPITAL_COMMUNITY): Payer: Self-pay

## 2024-03-17 ENCOUNTER — Telehealth: Payer: Self-pay | Admitting: Pharmacy Technician

## 2024-03-17 NOTE — Telephone Encounter (Signed)
 Pharmacy Patient Advocate Encounter  Received notification from [facility] that Prior Authorization for repatha  has been APPROVED from 03/17/24 to 03/17/25   PA #/Case ID/Reference #: [id number]

## 2024-03-17 NOTE — Telephone Encounter (Signed)
   Pharmacy Patient Advocate Encounter   Received notification from Pt Calls Messages that prior authorization for repatha  is required/requested.   Insurance verification completed.   The patient is insured through [facility].   Per test claim: PA required; PA submitted to above mentioned insurance via Latent Key/confirmation #/EOC [id number] Status is pending

## 2024-04-01 ENCOUNTER — Encounter: Payer: Self-pay | Admitting: *Deleted

## 2024-04-04 ENCOUNTER — Ambulatory Visit: Admitting: Cardiology

## 2024-04-14 ENCOUNTER — Ambulatory Visit: Admitting: Cardiology

## 2024-04-15 ENCOUNTER — Ambulatory Visit: Admitting: Cardiology

## 2024-04-16 NOTE — Progress Notes (Deleted)
 "  Office Visit Note  Patient: [name]             Date of Birth: [birth year]           MRN: [id number]             PCP: Benson Eleanor Rung, NP Referring: Benson Eleanor Rung, NP Visit Date: 04/18/2024 Occupation: Data Unavailable  Subjective:    History of Present Illness: [name] is a 63 y.o. female with history of osteoporosis and osteoarthritis.    Patient was started on Forteo  daily injections on 09/30/21.  Completed forteo  at the end of June 2025.   DEXA updated on 11/04/2023: Lumbar spine BMD 0.854 with T-score -1.8.  Left femoral neck BMD 0.586 with T-score -2.4.  Left total hip BMD 0.742 with T-score -1.6.  Left forearm BMD 0.642 with T-score -0.9. Results were discussed in detail at the last office visit. Plan to update DEXA again in July 2027. She received the 1st IV reclast  infusion on 11/11/23.  She tolerated IV Reclast  without any side effects. No recent falls or fractures. May consider having an updated DEXA next summer prior to deciding if she would like to proceed with another reclast  infusion.   Activities of Daily Living:  Patient reports morning stiffness for *** {minute/hour:19697}.   Patient {ACTIONS;DENIES/REPORTS:21021675::Denies} nocturnal pain.  Difficulty dressing/grooming: {ACTIONS;DENIES/REPORTS:21021675::Denies} Difficulty climbing stairs: {ACTIONS;DENIES/REPORTS:21021675::Denies} Difficulty getting out of chair: {ACTIONS;DENIES/REPORTS:21021675::Denies} Difficulty using hands for taps, buttons, cutlery, and/or writing: {ACTIONS;DENIES/REPORTS:21021675::Denies}  No Rheumatology ROS completed.   PMFS History:  Patient Active Problem List   Diagnosis Date Noted   Fatty liver 03/11/2024   Type 1 diabetes mellitus with hyperglycemia (HCC) 01/27/2024   Tear of medial meniscus of knee 12/30/2023   Mild intermittent asthma without complication 10/13/2023   Sacroiliac joint pain 05/26/2023   Acute pancreatitis with  uninfected necrosis 04/15/2023   History of pancreatitis 04/15/2023   Liver cyst 03/24/2023   Liver hemangioma 03/24/2023   DOE (dyspnea on exertion) 05/16/2022   Influenzal pneumonia 05/15/2022   Osteoarthritis of right knee 12/07/2021   Dermatographic urticaria 10/18/2021   S/P lumbar laminectomy 09/18/2021   Elevated parathyroid hormone 06/06/2021   Osteoporosis of multiple sites without pathological fracture 06/06/2021   Accelerating angina (HCC) 05/06/2021   Complex regional pain syndrome of lower limb 03/07/2021   Pelvic pain 12/14/2020   RUQ pain 11/09/2020   Elevated alkaline phosphatase level 11/09/2020   Uncomplicated type 1 diabetes mellitus (HCC) 10/22/2020   Lipoma of left upper extremity 09/04/2020   Irritable bowel syndrome 09/03/2020   Idiopathic peripheral neuropathy 08/29/2020   Lipoma of abdominal wall 08/08/2020   Fever 07/18/2020   Hypotension 07/18/2020   Sepsis (HCC) 07/18/2020   Pneumonia 07/18/2020   Arthritis 07/18/2020   Blurred vision 07/11/2020   Facial tingling 07/11/2020   Diabetes mellitus without complication (HCC)    Acute left ankle pain 03/09/2020   History of pneumonia 01/25/2020   History of sepsis 01/25/2020   Respiratory failure (HCC) 01/13/2020   HAP (hospital-acquired pneumonia) 01/13/2020   Fusion of lumbar spine 01/11/2020   Trigger thumb of right hand 11/14/2019   Low back pain 10/22/2019   Obsessive-compulsive disorder 10/14/2019   Diabetes mellitus due to underlying condition with unspecified complications (HCC) 09/15/2019   Primary osteoarthritis involving multiple joints 08/24/2019   Alcohol use 04/15/2019   Elevated LFTs 03/10/2019   Lumbar post-laminectomy syndrome 03/04/2019   Acute bilateral thoracic back pain 12/03/2018   Pain of left hip joint  07/23/2018   Polyarthralgia 06/11/2018   Bursitis of both hips 04/14/2018   Left foot pain 01/28/2018   CAD (coronary artery disease) 12/24/2017   Secondary diabetes  mellitus (HCC) 12/24/2017   Coronary arteriosclerosis 12/24/2017   Dyspnea on exertion 12/04/2017   Attention deficit 12/04/2017   Intermittent claudication 12/04/2017   History of metabolic disorder 12/04/2017   Lung mass 12/04/2017   Myositis 12/04/2017   Erythrocytosis 12/04/2017   Senile purpura 12/04/2017   Chest pain 11/06/2017   Multiple actinic keratoses 11/06/2017   Degeneration of lumbar intervertebral disc 11/06/2017   Fatigue 11/06/2017   Insulin  long-term use (HCC) 11/06/2017   Menopausal disorder 11/06/2017   Purpura 11/06/2017   Moderate chronic obstructive pulmonary disease (HCC) 11/06/2017   Folic acid  deficiency 11/06/2017   Malaise and fatigue 11/06/2017   Arteriosclerotic vascular disease 11/06/2017   Dysphagia 11/04/2017   Sciatica 11/03/2017   Scoliosis deformity of spine 11/03/2017   Lumbar radiculopathy 05/26/2017   Personal history of (healed) traumatic fracture 12/31/2016   Deficiency of other specified B group vitamins 11/07/2016   Mixed hyperlipidemia 04/09/2016   Smoking greater than 30 pack years 02/26/2016   Cigarette smoker 02/26/2016   Nicotine dependence, cigarettes, uncomplicated 02/26/2016   Diabetes mellitus type 1, uncomplicated (HCC) 02/11/2016   Tobacco abuse 02/11/2016   Tobacco user 02/11/2016   Pure hypercholesterolemia 02/11/2016   Atherosclerosis of native coronary artery of native heart without angina pectoris 02/11/2016   Type 1 diabetes mellitus without complication 02/11/2016   Coronary artery disease of native artery of native heart with stable angina pectoris 01/25/2016   Abnormal thyroid  function test 12/06/2015   Chronic bronchitis (HCC) 12/04/2015   Overweight with body mass index (BMI) 25.0-29.9 07/11/2015   BMI 27.0-27.9,adult 07/11/2015   Pain in both lower extremities 07/10/2015   Anxiety disorder 06/05/2015   Atherosclerosis 06/05/2015   Hemangioma 06/05/2015   Primary insomnia 06/05/2015   Trichotillomania  06/05/2015   HNP (herniated nucleus pulposus), lumbar 05/10/2014    Past Medical History:  Diagnosis Date   Abnormal thyroid  function test 12/06/2015   Accelerating angina (HCC) 05/06/2021   Acute bilateral thoracic back pain 12/03/2018   Acute left ankle pain 03/09/2020   Alcohol use 04/15/2019   Formatting of this note might be different from the original. Recommend abstinence from alcohol   Anxiety disorder 06/05/2015   Last Assessment & Plan:  Formatting of this note might be different from the original. Had started her on some xanax  about week ago and she feels it has helped her and she is taking about half tablet for this and will continue with it as she has been compliant with it   Arteriosclerotic vascular disease 11/06/2017   Arthritis    back    Atherosclerosis 06/05/2015   Formatting of this note might be different from the original. Seen on CTs of chest.   Atherosclerosis of native coronary artery of native heart without angina pectoris 02/11/2016   Attention deficit 12/04/2017   Blurred vision 07/11/2020   BMI 27.0-27.9,adult 07/11/2015   Last Assessment & Plan:  Formatting of this note might be different from the original. Relevant Hx: Course: Daily Update: Today's Plan:difficult for her with the insulin  as well as her abilify and the weight is increaseing for her rather than decreasing, she has cut her alcohol intake back, she has worked on her diet more with decreasing her carb intake as well and still difficulty discussed with    Bursitis of both hips 04/14/2018  CAD (coronary artery disease) 12/24/2017   Chest pain 11/06/2017   Chronic bronchitis (HCC) 12/04/2015   Formatting of this note might be different from the original. PFT ratio 88% FEV1 103% FVC 94% DLCO 76%  Last Assessment & Plan:  Formatting of this note might be different from the original. We discussed this as she has been coughing again for about 2 mnths and she sounds congested on exam with previous ease for  pneumonia will place her on levaquin and have reviewed with her the importance of her    Cigarette smoker 02/26/2016   Complex regional pain syndrome of lower limb 03/07/2021   Coronary arteriosclerosis 12/24/2017   Coronary artery disease of native artery of native heart with stable angina pectoris 01/25/2016   Formatting of this note might be different from the original. 2017:  50% LAD lesion.  Calcium noted other places.  Heart cath December 31, 2017 with a 30% ostial LAD lesion otherwise normal coronary arteries seen with LVEF 55 to 65%   Deficiency of other specified B group vitamins 11/07/2016   Degeneration of lumbar intervertebral disc 11/06/2017   Diabetes mellitus due to underlying condition with unspecified complications (HCC) 09/15/2019   Diabetes mellitus type 1, uncomplicated (HCC) 02/11/2016   Added automatically from request for surgery [id number]  Formatting of this note might be different from the original. Added automatically from request for surgery [id number]   Diabetes mellitus without complication (HCC)    Type 1- injections only- recent change to Toujeo  with improved A1C reading.   Dysphagia 11/04/2017   Dyspnea on exertion 12/04/2017   Elevated alkaline phosphatase level 11/09/2020   Elevated LFTs 03/10/2019   Formatting of this note might be different from the original. History.  Cannot change to this.   Elevated parathyroid hormone 06/06/2021   Erythrocytosis 12/04/2017   Facial tingling 07/11/2020   Fatigue 11/06/2017   Fever 07/18/2020   Folic acid  deficiency 11/06/2017   Fusion of lumbar spine 01/11/2020   HAP (hospital-acquired pneumonia) 01/13/2020   Hemangioma 11/06/2017   History of metabolic disorder 12/04/2017   History of pneumonia 01/25/2020   Formatting of this note might be different from the original. Recent mutifocal pneumonia September 2021 Formatting of this note might be different from the original. Formatting of this note might be different from the  original. Recent mutifocal pneumonia September 2021   History of sepsis 01/25/2020   Formatting of this note might be different from the original. Recent septic syndrome with multifocal pneumonia Formatting of this note might be different from the original. Formatting of this note might be different from the original. Recent septic syndrome with multifocal pneumonia   HNP (herniated nucleus pulposus), lumbar 05/10/2014   Hypotension 07/18/2020   Idiopathic peripheral neuropathy 08/29/2020   Insulin  long-term use (HCC) 11/06/2017   Intermittent claudication 12/04/2017   Irritable bowel syndrome 09/03/2020   Left foot pain 01/28/2018   Lipoma of abdominal wall 08/08/2020   Lipoma of left upper extremity 09/04/2020   Low back pain 10/22/2019   Lumbar post-laminectomy syndrome 03/04/2019   Lumbar radiculopathy 05/26/2017   Lung mass 12/04/2017   Malaise and fatigue 11/06/2017   Menopausal disorder 11/06/2017   Mixed hyperlipidemia 04/09/2016   Formatting of this note might be different from the original. Added automatically from request for surgery [id number]   Moderate chronic obstructive pulmonary disease (HCC) 11/06/2017   Multiple actinic keratoses 11/06/2017   Myositis 12/04/2017   Nicotine dependence, cigarettes, uncomplicated 02/26/2016   Obsessive-compulsive disorder 10/14/2019  Formatting of this note might be different from the original. Formerly followed with [facility].   Osteoporosis of multiple sites without pathological fracture 06/06/2021   Overweight with body mass index (BMI) 25.0-29.9 07/11/2015   Last Assessment & Plan:  Relevant Hx: Course: Daily Update: Today's Plan:difficult for her with the insulin  as well as her abilify and the weight is increaseing for her rather than decreasing, she has cut her alcohol intake back, she has worked on her diet more with decreasing her carb intake as well and still difficulty discussed with the saxenda and will try to get this approved for  her and see    Pain in both lower extremities 07/10/2015   Last Assessment & Plan:  Formatting of this note might be different from the original. Relevant Hx: Course: Daily Update: Today's Plan:she had pain to her right anterior thigh there is edema of both legs and she has fear for DVT which she is going to have US  of and confirm ok, she has increased her weight which is part of her edema I believe as well as her insulin  usage. She is advised as well tha   Pain of left hip joint 07/23/2018   Pelvic pain 12/14/2020   Personal history of (healed) traumatic fracture 12/31/2016   Pneumonia    Polyarthralgia 06/11/2018   Primary insomnia 06/05/2015   Primary osteoarthritis involving multiple joints 08/24/2019   Pure hypercholesterolemia 02/11/2016   Purpura 11/06/2017   Respiratory failure (HCC) 11/2018   RUQ pain 11/09/2020   S/P lumbar laminectomy 09/18/2021   Sciatica 11/03/2017   Scoliosis deformity of spine 11/03/2017   Secondary diabetes mellitus (HCC) 12/24/2017   Senile purpura 12/04/2017   Sepsis (HCC) 07/18/2020   Smoking greater than 30 pack years 02/26/2016   Tobacco abuse 02/11/2016   Tobacco user 02/11/2016   Trichotillomania    hx.    Trigger thumb of right hand 11/14/2019   Uncomplicated type 1 diabetes mellitus (HCC) 10/22/2020    Family History  Problem Relation Age of Onset   Rheum arthritis Mother    Diabetes Mother    Cancer Mother    Hypertension Father    Heart disease Father    Diabetes Father    Healthy Brother    Healthy Daughter    Diabetes Son    Past Surgical History:  Procedure Laterality Date   ABDOMINAL EXPOSURE N/A 01/11/2020   Procedure: ABDOMINAL EXPOSURE;  Surgeon: Gretta Lonni PARAS, MD;  Location[facility] OR;  Service: Vascular;  Laterality: N/A;   ANTERIOR LUMBAR FUSION N/A 01/11/2020   Procedure: ANTERIOR LUMBAR INTERBODY FUSION  LUMBAR FIVE-SACRAL ONE, LEFT LUMBAR TWO-THREE FORAMINOTOMY, DISECTOMY;  Surgeon: Burnetta Aures, MD;  Location:  [facility] OR;  Service: Orthopedics;  Laterality: N/A;   APPENDECTOMY  04/28/1970   BACK SURGERY  2016   CARDIAC CATHETERIZATION  12/31/2017   COLONOSCOPY  04/28/2017   CORONARY PRESSURE/FFR STUDY N/A 05/06/2021   Procedure: INTRAVASCULAR PRESSURE WIRE/FFR STUDY;  Surgeon: Mady Lonni, MD;  Location: [facility] INVASIVE CV LAB;  Service: Cardiovascular;  Laterality: N/A;   EXCISION/RELEASE BURSA HIP Left 04/14/2018   Procedure: Excision trochanteric bursa left hip;  Surgeon: Duwayne Purchase, MD;  Location: [facility] ORS;  Service: Orthopedics;  Laterality: Left;  60 mins   HAND SURGERY     HIP OPEN REDUCTION Right    ORIF   KNEE ARTHROSCOPY     KNEE ARTHROSCOPY Right 12/2023   LEFT HEART CATH AND CORONARY ANGIOGRAPHY N/A 12/31/2017   Procedure: LEFT HEART CATH  AND CORONARY ANGIOGRAPHY;  Surgeon: Jordan, Peter M, MD;  Location: [facility] INVASIVE CV LAB;  Service: Cardiovascular;  Laterality: N/A;   LEFT HEART CATH AND CORONARY ANGIOGRAPHY N/A 05/06/2021   Procedure: LEFT HEART CATH AND CORONARY ANGIOGRAPHY;  Surgeon: Mady Bruckner, MD;  Location: [facility] INVASIVE CV LAB;  Service: Cardiovascular;  Laterality: N/A;   LUMBAR LAMINECTOMY/DECOMPRESSION MICRODISCECTOMY Left 05/10/2014   Procedure: MICRODISCECTOMY LUMBAR DECOMPRESSION L5-S1 LEFT ;  Surgeon: Reyes JAYSON Billing, MD;  Location: [facility] ORS;  Service: Orthopedics;  Laterality: Left;   LUMBAR LAMINECTOMY/DECOMPRESSION MICRODISCECTOMY N/A 01/11/2020   Procedure: LUMBAR LAMINECTOMY/DECOMPRESSION MICRODISCECTOMY LEFT LUMBAR TWO-THREE;  Surgeon: Burnetta Aures, MD;  Location: [facility] OR;  Service: Orthopedics;  Laterality: N/A;   LUMBAR LAMINECTOMY/DECOMPRESSION MICRODISCECTOMY Right 09/18/2021   Procedure: Laminectomy and Foraminotomy - Lumbar Four-Lumbar Five - right;  Surgeon: Joshua Alm RAMAN, MD;  Location: [facility] OR;  Service: Neurosurgery;  Laterality: Right;  Laminectomy and Foraminotomy - Lumbar Four-Lumbar Five - right   ORIF WRIST FRACTURE Right    retained hardware    ORTHOPAEDIC SURGERY  04/14/2018   TUBAL LIGATION  12/25/1995   Social History[1] Social History   Social History Narrative   ** Merged History Encounter **         Immunization History  Administered Date(s) Administered   INFLUENZA, HIGH DOSE SEASONAL PF 02/29/2024   Influenza, Seasonal, Injecte, Preservative Fre 01/22/2023   Influenza,inj,Quad PF,6+ Mos 01/21/2016, 12/31/2016, 02/09/2018, 01/18/2021, 04/03/2022   Influenza,inj,quad, With Preservative 12/27/2016   Influenza-Unspecified 01/21/2016, 12/31/2016   Moderna Sars-Covid-2 Vaccination 04/29/2019, 05/30/2019   PNEUMOCOCCAL CONJUGATE-20 02/29/2024   Pneumococcal Conjugate-13 07/23/2017   Pneumococcal Polysaccharide-23 02/01/2021   Respiratory Syncytial Virus Vaccine,Recomb Aduvanted(Arexvy) 03/14/2024   Unspecified SARS-COV-2 Vaccination 02/28/2020     Objective: Vital Signs: LMP 05/04/2008    Physical Exam Vitals and nursing note reviewed.  Constitutional:      Appearance: She is well-developed.  HENT:     Head: Normocephalic and atraumatic.  Eyes:     Conjunctiva/sclera: Conjunctivae normal.  Cardiovascular:     Rate and Rhythm: Normal rate and regular rhythm.     Heart sounds: Normal heart sounds.  Pulmonary:     Effort: Pulmonary effort is normal.     Breath sounds: Normal breath sounds.  Abdominal:     General: Bowel sounds are normal.     Palpations: Abdomen is soft.  Musculoskeletal:     Cervical back: Normal range of motion.  Lymphadenopathy:     Cervical: No cervical adenopathy.  Skin:    General: Skin is warm and dry.     Capillary Refill: Capillary refill takes less than 2 seconds.  Neurological:     Mental Status: She is alert and oriented to person, place, and time.  Psychiatric:        Behavior: Behavior normal.      Musculoskeletal Exam: ***  CDAI Exam: CDAI Score: -- Patient Global: --; Provider Global: -- Swollen: --; Tender: -- Joint Exam 04/18/2024   No joint exam has been  documented for this visit   There is currently no information documented on the homunculus. Go to the Rheumatology activity and complete the homunculus joint exam.  Investigation: No additional findings.  Imaging: No results found.  Recent Labs: Lab Results  Component Value Date   WBC 7.5 10/09/2023   HGB 13.6 10/09/2023   PLT 235 10/09/2023   NA 138 11/10/2023   K 4.3 11/10/2023   CL 100 11/10/2023   CO2 32 11/10/2023   GLUCOSE 193 (  H) 11/10/2023   BUN 20 11/10/2023   CREATININE 0.65 11/10/2023   BILITOT 0.9 11/10/2023   ALKPHOS 122 (H) 10/09/2023   AST 13 11/10/2023   ALT 15 11/10/2023   PROT 6.0 (L) 11/10/2023   ALBUMIN  4.2 10/09/2023   CALCIUM 9.7 11/10/2023   GFRAA >60 01/21/2020    Speciality Comments: Avoid oral bisphosphonates-history of GER Forteo  started September 26, 2021  Procedures:  No procedures performed Allergies: Celebrex [celecoxib] and Mobic [meloxicam]   Assessment / Plan:     Visit Diagnoses: No diagnosis found.  Orders: No orders of the defined types were placed in this encounter.  No orders of the defined types were placed in this encounter.   Face-to-face time spent with patient was *** minutes. Greater than 50% of time was spent in counseling and coordination of care.  Follow-Up Instructions: No follow-ups on file.   Waddell CHRISTELLA Craze, PA-C  Note - This record has been created using Dragon software.  Chart creation errors have been sought, but may not always  have been located. Such creation errors do not reflect on  the standard of medical care.     [1]  Social History Tobacco Use   Smoking status: Every Day    Current packs/day: 1.00    Average packs/day: 1 pack/day for 46.6 years (46.6 ttl pk-yrs)    Types: Cigarettes    Start date: 09/24/1977    Passive exposure: Current   Smokeless tobacco: Never   Tobacco comments:    Pt states 0.5 ppd.   Vaping Use   Vaping status: Never Used  Substance Use Topics   Alcohol use: Yes     Comment: Occassional   Drug use: No   "

## 2024-04-18 ENCOUNTER — Ambulatory Visit: Admitting: Physician Assistant

## 2024-04-18 DIAGNOSIS — M4326 Fusion of spine, lumbar region: Secondary | ICD-10-CM

## 2024-04-18 DIAGNOSIS — M810 Age-related osteoporosis without current pathological fracture: Secondary | ICD-10-CM

## 2024-04-18 DIAGNOSIS — M19041 Primary osteoarthritis, right hand: Secondary | ICD-10-CM

## 2024-04-18 DIAGNOSIS — F633 Trichotillomania: Secondary | ICD-10-CM

## 2024-04-18 DIAGNOSIS — Z8619 Personal history of other infectious and parasitic diseases: Secondary | ICD-10-CM

## 2024-04-18 DIAGNOSIS — Z8261 Family history of arthritis: Secondary | ICD-10-CM

## 2024-04-18 DIAGNOSIS — Z8719 Personal history of other diseases of the digestive system: Secondary | ICD-10-CM

## 2024-04-18 DIAGNOSIS — E7800 Pure hypercholesterolemia, unspecified: Secondary | ICD-10-CM

## 2024-04-18 DIAGNOSIS — F17210 Nicotine dependence, cigarettes, uncomplicated: Secondary | ICD-10-CM

## 2024-04-18 DIAGNOSIS — E559 Vitamin D deficiency, unspecified: Secondary | ICD-10-CM

## 2024-04-18 DIAGNOSIS — I7090 Unspecified atherosclerosis: Secondary | ICD-10-CM

## 2024-04-18 DIAGNOSIS — E109 Type 1 diabetes mellitus without complications: Secondary | ICD-10-CM

## 2024-04-18 DIAGNOSIS — L570 Actinic keratosis: Secondary | ICD-10-CM

## 2024-04-18 DIAGNOSIS — Z5181 Encounter for therapeutic drug level monitoring: Secondary | ICD-10-CM

## 2024-04-18 DIAGNOSIS — M1711 Unilateral primary osteoarthritis, right knee: Secondary | ICD-10-CM

## 2024-04-18 DIAGNOSIS — J449 Chronic obstructive pulmonary disease, unspecified: Secondary | ICD-10-CM

## 2024-04-18 DIAGNOSIS — M1712 Unilateral primary osteoarthritis, left knee: Secondary | ICD-10-CM

## 2024-04-18 DIAGNOSIS — G8929 Other chronic pain: Secondary | ICD-10-CM

## 2024-04-18 DIAGNOSIS — Z8659 Personal history of other mental and behavioral disorders: Secondary | ICD-10-CM

## 2024-04-19 ENCOUNTER — Ambulatory Visit: Admitting: Rheumatology

## 2024-04-25 ENCOUNTER — Ambulatory Visit: Attending: Cardiology | Admitting: Cardiology

## 2024-04-25 ENCOUNTER — Encounter: Payer: Self-pay | Admitting: Cardiology

## 2024-04-25 VITALS — BP 114/68 | HR 68 | Ht 62.0 in | Wt 149.6 lb

## 2024-04-25 DIAGNOSIS — I2510 Atherosclerotic heart disease of native coronary artery without angina pectoris: Secondary | ICD-10-CM

## 2024-04-25 DIAGNOSIS — E782 Mixed hyperlipidemia: Secondary | ICD-10-CM

## 2024-04-25 DIAGNOSIS — R0609 Other forms of dyspnea: Secondary | ICD-10-CM

## 2024-04-25 DIAGNOSIS — E088 Diabetes mellitus due to underlying condition with unspecified complications: Secondary | ICD-10-CM

## 2024-04-25 DIAGNOSIS — F17210 Nicotine dependence, cigarettes, uncomplicated: Secondary | ICD-10-CM

## 2024-04-25 DIAGNOSIS — J449 Chronic obstructive pulmonary disease, unspecified: Secondary | ICD-10-CM

## 2024-04-25 MED ORDER — AZITHROMYCIN 250 MG PO TABS: 250.0000 mg | ORAL_TABLET | ORAL | 0 refills | Status: AC

## 2024-04-25 NOTE — Progress Notes (Signed)
 " Cardiology Office Note:    Date:  04/25/2024   ID:  [name], DOB Apr 11, 1961, MRN [id number]  PCP:  Benson Eleanor Rung, NP  Cardiologist:  Lamar Fitch, MD    Referring MD: Benson Eleanor Rung, NP   No chief complaint on file. I have called in sinusitis  History of Present Illness:    [name] is a 63 y.o. female past medical history significant for coronary artery disease.  In 2019 she had cardiac catheterization which showed 30% LAD stenosis otherwise anatomy was normal.  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  Additional problem include essential hypertension, diabetes, smoking and drinking alcohol which is still ongoing.  Comes today to months for follow-up.  She complained of having cold-like symptoms runny nose sinusitis and also cough.  She takes Mucinex as well as DayQuil and NyQuil that seems to be helping.  Not much shortness of breath but just simply does not feel well.  Past Medical History:  Diagnosis Date   Abnormal thyroid  function test 12/06/2015   Accelerating angina (HCC) 05/06/2021   Acute bilateral thoracic back pain 12/03/2018   Acute left ankle pain 03/09/2020   Alcohol use 04/15/2019   Formatting of this note might be different from the original. Recommend abstinence from alcohol   Anxiety disorder 06/05/2015   Last Assessment & Plan:  Formatting of this note might be different from the original. Had started her on some xanax  about week ago and she feels it has helped her and she is taking about half tablet for this and will continue with it as she has been compliant with it    Arteriosclerotic vascular disease 11/06/2017   Arthritis    back    Atherosclerosis 06/05/2015   Formatting of this note might be different from the original. Seen on CTs of chest.   Atherosclerosis of native coronary artery of native heart without angina pectoris 02/11/2016   Attention deficit 12/04/2017   Blurred vision 07/11/2020   BMI 27.0-27.9,adult 07/11/2015   Last Assessment & Plan:  Formatting of this note might be different from the original. Relevant Hx: Course: Daily Update: Today's Plan:difficult for her with the insulin  as well as her abilify and the weight is increaseing for her rather than decreasing, she has cut her alcohol intake back, she has worked on her diet more with decreasing her carb intake as well and still difficulty discussed with    Bursitis of both hips 04/14/2018   CAD (coronary artery disease) 12/24/2017   Chest pain 11/06/2017   Chronic bronchitis (HCC) 12/04/2015   Formatting of this note might be different from the original. PFT ratio 88% FEV1 103% FVC 94% DLCO 76%  Last Assessment & Plan:  Formatting of this note might be different from the original. We discussed this as she has been coughing again for about 2 mnths and she sounds congested on exam with previous ease for pneumonia will place her on levaquin and have reviewed with her the importance of her    Cigarette smoker 02/26/2016   Complex regional pain syndrome of lower limb 03/07/2021   Coronary arteriosclerosis 12/24/2017   Coronary artery disease of  native artery of native heart with stable angina pectoris 01/25/2016   Formatting of this note might be different from the original. 2017:  50% LAD lesion.  Calcium noted other places.  Heart cath December 31, 2017 with a 30% ostial LAD lesion otherwise normal coronary arteries seen with LVEF 55 to 65%   Deficiency of other specified B group vitamins 11/07/2016   Degeneration of lumbar intervertebral disc 11/06/2017   Diabetes mellitus due to underlying  condition with unspecified complications (HCC) 09/15/2019   Diabetes mellitus type 1, uncomplicated (HCC) 02/11/2016   Added automatically from request for surgery [id number]  Formatting of this note might be different from the original. Added automatically from request for surgery [id number]   Diabetes mellitus without complication (HCC)    Type 1- injections only- recent change to Toujeo  with improved A1C reading.   Dysphagia 11/04/2017   Dyspnea on exertion 12/04/2017   Elevated alkaline phosphatase level 11/09/2020   Elevated LFTs 03/10/2019   Formatting of this note might be different from the original. History.  Cannot change to this.   Elevated parathyroid hormone 06/06/2021   Erythrocytosis 12/04/2017   Facial tingling 07/11/2020   Fatigue 11/06/2017   Fever 07/18/2020   Folic acid  deficiency 11/06/2017   Fusion of lumbar spine 01/11/2020   HAP (hospital-acquired pneumonia) 01/13/2020   Hemangioma 11/06/2017   History of metabolic disorder 12/04/2017   History of pneumonia 01/25/2020   Formatting of this note might be different from the original. Recent mutifocal pneumonia September 2021 Formatting of this note might be different from the original. Formatting of this note might be different from the original. Recent mutifocal pneumonia September 2021   History of sepsis 01/25/2020   Formatting of this note might be different from the original. Recent septic syndrome with multifocal pneumonia Formatting of this note might be different from the original. Formatting of this note might be different from the original. Recent septic syndrome with multifocal pneumonia   HNP (herniated nucleus pulposus), lumbar 05/10/2014   Hypotension 07/18/2020   Idiopathic peripheral neuropathy 08/29/2020   Insulin  long-term use (HCC) 11/06/2017   Intermittent claudication 12/04/2017   Irritable bowel syndrome 09/03/2020   Left foot pain 01/28/2018   Lipoma of abdominal wall 08/08/2020   Lipoma of left upper  extremity 09/04/2020   Low back pain 10/22/2019   Lumbar post-laminectomy syndrome 03/04/2019   Lumbar radiculopathy 05/26/2017   Lung mass 12/04/2017   Malaise and fatigue 11/06/2017   Menopausal disorder 11/06/2017   Mixed hyperlipidemia 04/09/2016   Formatting of this note might be different from the original. Added automatically from request for surgery [id number]   Moderate chronic obstructive pulmonary disease (HCC) 11/06/2017   Multiple actinic keratoses 11/06/2017   Myositis 12/04/2017   Nicotine dependence, cigarettes, uncomplicated 02/26/2016   Obsessive-compulsive disorder 10/14/2019   Formatting of this note might be different from the original. Formerly followed with [facility].   Osteoporosis of multiple sites without pathological fracture 06/06/2021   Overweight with body mass index (BMI) 25.0-29.9 07/11/2015   Last Assessment & Plan:  Relevant Hx: Course: Daily Update: Today's Plan:difficult for her with the insulin  as well as her abilify and the weight is increaseing for her rather than decreasing, she has cut her alcohol intake back, she has worked on her diet more with decreasing her carb intake as well and still difficulty discussed with the saxenda and will try to get this approved for her and see    Pain in both lower extremities 07/10/2015  Last Assessment & Plan:  Formatting of this note might be different from the original. Relevant Hx: Course: Daily Update: Today's Plan:she had pain to her right anterior thigh there is edema of both legs and she has fear for DVT which she is going to have US  of and confirm ok, she has increased her weight which is part of her edema I believe as well as her insulin  usage. She is advised as well tha   Pain of left hip joint 07/23/2018   Pelvic pain 12/14/2020   Personal history of (healed) traumatic fracture 12/31/2016   Pneumonia    Polyarthralgia 06/11/2018   Primary insomnia 06/05/2015   Primary osteoarthritis involving multiple joints  08/24/2019   Pure hypercholesterolemia 02/11/2016   Purpura 11/06/2017   Respiratory failure (HCC) 11/2018   RUQ pain 11/09/2020   S/P lumbar laminectomy 09/18/2021   Sciatica 11/03/2017   Scoliosis deformity of spine 11/03/2017   Secondary diabetes mellitus (HCC) 12/24/2017   Senile purpura 12/04/2017   Sepsis (HCC) 07/18/2020   Smoking greater than 30 pack years 02/26/2016   Tobacco abuse 02/11/2016   Tobacco user 02/11/2016   Trichotillomania    hx.    Trigger thumb of right hand 11/14/2019   Uncomplicated type 1 diabetes mellitus (HCC) 10/22/2020    Past Surgical History:  Procedure Laterality Date   ABDOMINAL EXPOSURE N/A 01/11/2020   Procedure: ABDOMINAL EXPOSURE;  Surgeon: Gretta Lonni PARAS, MD;  Location: [facility] OR;  Service: Vascular;  Laterality: N/A;   ANTERIOR LUMBAR FUSION N/A 01/11/2020   Procedure: ANTERIOR LUMBAR INTERBODY FUSION  LUMBAR FIVE-SACRAL ONE, LEFT LUMBAR TWO-THREE FORAMINOTOMY, DISECTOMY;  Surgeon: Burnetta Aures, MD;  Location: [facility] OR;  Service: Orthopedics;  Laterality: N/A;   APPENDECTOMY  04/28/1970   BACK SURGERY  2016   CARDIAC CATHETERIZATION  12/31/2017   COLONOSCOPY  04/28/2017   CORONARY PRESSURE/FFR STUDY N/A 05/06/2021   Procedure: INTRAVASCULAR PRESSURE WIRE/FFR STUDY;  Surgeon: Mady Lonni, MD;  Location: [facility] INVASIVE CV LAB;  Service: Cardiovascular;  Laterality: N/A;   EXCISION/RELEASE BURSA HIP Left 04/14/2018   Procedure: Excision trochanteric bursa left hip;  Surgeon: Duwayne Purchase, MD;  Location: [facility] ORS;  Service: Orthopedics;  Laterality: Left;  60 mins   HAND SURGERY     HIP OPEN REDUCTION Right    ORIF   KNEE ARTHROSCOPY     KNEE ARTHROSCOPY Right 12/2023   LEFT HEART CATH AND CORONARY ANGIOGRAPHY N/A 12/31/2017   Procedure: LEFT HEART CATH AND CORONARY ANGIOGRAPHY;  Surgeon: Jordan, Peter M, MD;  Location: [facility] INVASIVE CV LAB;  Service: Cardiovascular;  Laterality: N/A;   LEFT HEART CATH AND CORONARY ANGIOGRAPHY N/A  05/06/2021   Procedure: LEFT HEART CATH AND CORONARY ANGIOGRAPHY;  Surgeon: Mady Lonni, MD;  Location: [facility] INVASIVE CV LAB;  Service: Cardiovascular;  Laterality: N/A;   LUMBAR LAMINECTOMY/DECOMPRESSION MICRODISCECTOMY Left 05/10/2014   Procedure: MICRODISCECTOMY LUMBAR DECOMPRESSION L5-S1 LEFT ;  Surgeon: Purchase JAYSON Duwayne, MD;  Location: [facility] ORS;  Service: Orthopedics;  Laterality: Left;   LUMBAR LAMINECTOMY/DECOMPRESSION MICRODISCECTOMY N/A 01/11/2020   Procedure: LUMBAR LAMINECTOMY/DECOMPRESSION MICRODISCECTOMY LEFT LUMBAR TWO-THREE;  Surgeon: Burnetta Aures, MD;  Location: [facility] OR;  Service: Orthopedics;  Laterality: N/A;   LUMBAR LAMINECTOMY/DECOMPRESSION MICRODISCECTOMY Right 09/18/2021   Procedure: Laminectomy and Foraminotomy - Lumbar Four-Lumbar Five - right;  Surgeon: Joshua Alm RAMAN, MD;  Location: [facility] OR;  Service: Neurosurgery;  Laterality: Right;  Laminectomy and Foraminotomy - Lumbar Four-Lumbar Five - right   ORIF WRIST FRACTURE Right    retained  hardware   ORTHOPAEDIC SURGERY  04/14/2018   TUBAL LIGATION  12/25/1995    Current Medications: Active Medications[1]   Allergies:   Celebrex [celecoxib] and Mobic [meloxicam]   Social History   Socioeconomic History   Marital status: Divorced    Spouse name: Not on file   Number of children: Not on file   Years of education: Not on file   Highest education level: Not on file  Occupational History   Not on file  Tobacco Use   Smoking status: Every Day    Current packs/day: 1.00    Average packs/day: 1 pack/day for 46.6 years (46.6 ttl pk-yrs)    Types: Cigarettes    Start date: 09/24/1977    Passive exposure: Current   Smokeless tobacco: Never   Tobacco comments:    Pt states 0.5 ppd.   Vaping Use   Vaping status: Never Used  Substance and Sexual Activity   Alcohol use: Yes    Comment: Occassional   Drug use: No   Sexual activity: Not Currently  Other Topics Concern   Not on file  Social History Narrative   **  Merged History Encounter **       Social Drivers of Health   Tobacco Use: High Risk (04/25/2024)   Patient History    Smoking Tobacco Use: Every Day    Smokeless Tobacco Use: Never    Passive Exposure: Current  Financial Resource Strain: Not on file  Food Insecurity: Low Risk (05/21/2023)   Received from [facility]   Epic    Within the past 12 months, you worried that your food would run out before you got money to buy more: Never true    Within the past 12 months, the food you bought just didn't last and you didn't have money to get more. : Never true  Transportation Needs: No Transportation Needs (05/21/2023)   Received from [facility]    In the past 12 months, has lack of reliable transportation kept you from medical appointments, meetings, work or from getting things needed for daily living? : No  Physical Activity: Not on file  Stress: Not on file  Social Connections: Unknown (09/08/2021)   Received from [facility]   Social Network    Social Network: Not on file  Depression (PHQ2-9): Not on file  Alcohol Screen: Not on file  Housing: Low Risk (05/21/2023)   Received from [facility]   Epic    What is your living situation today?: I have a steady place to live    Think about the place you live. Do you have problems with any of the following? Choose all that apply:: None/None on this list  Utilities: Low Risk (05/21/2023)   Received from [facility]   Utilities    In the past 12 months has the electric, gas, oil, or water company threatened to shut off services in your home? : No  Health Literacy: Not on file     Family History: The patient's family history includes Cancer in her mother; Diabetes in her father, mother, and son; Healthy in her brother and daughter; Heart disease in her father; Hypertension in her father; Rheum arthritis in her mother. ROS:   Please see the history of present illness.    All 14 point review of systems negative  except as described per history of present illness  EKGs/Labs/Other Studies Reviewed:    EKG Interpretation Date/Time:  Monday April 25 2024 14:20:49 EST Ventricular Rate:  68 PR Interval:  126 QRS Duration:  84 QT Interval:  422 QTC Calculation: 448 R Axis:   20  Text Interpretation: Normal sinus rhythm Nonspecific T wave abnormality When compared with ECG of 24-Mar-2023 13:15, No significant change was found Confirmed by Bernie Charleston [phone]) on 04/25/2024 2:24:36 PM    Recent Labs: 10/09/2023: Hemoglobin 13.6; Platelets 235; TSH 0.901 11/10/2023: ALT 15; BUN 20; Creat 0.65; Potassium 4.3; Sodium 138  Recent Lipid Panel    Component Value Date/Time   CHOL 127 10/09/2023 1008   TRIG 79 10/09/2023 1008   HDL 72 10/09/2023 1008   CHOLHDL 1.8 10/09/2023 1008   LDLCALC 40 10/09/2023 1008   LDLDIRECT 31 10/08/2022 0832    Physical Exam:    VS:  BP 114/68   Pulse 68   Ht 5' 2 (1.575 m)   Wt 149 lb 9.6 oz (67.9 kg)   LMP 05/04/2008   SpO2 97%   BMI 27.36 kg/m     Wt Readings from Last 3 Encounters:  04/25/24 149 lb 9.6 oz (67.9 kg)  11/11/23 153 lb (69.4 kg)  11/10/23 152 lb 12.8 oz (69.3 kg)     GEN:  Well nourished, well developed in no acute distress HEENT: Normal NECK: No JVD; No carotid bruits LYMPHATICS: No lymphadenopathy CARDIAC: RRR, no murmurs, no rubs, no gallops RESPIRATORY:  Clear to auscultation without rales, wheezing or rhonchi  ABDOMEN: Soft, non-tender, non-distended MUSCULOSKELETAL:  No edema; No deformity  SKIN: Warm and dry LOWER EXTREMITIES: no swelling NEUROLOGIC:  Alert and oriented x 3 PSYCHIATRIC:  Normal affect   ASSESSMENT:    1. Dyspnea on exertion   2. Mixed hyperlipidemia   3. Coronary artery disease involving native coronary artery of native heart without angina pectoris   4. Moderate chronic obstructive pulmonary disease (HCC)   5. Diabetes mellitus due to underlying condition with unspecified complications (HCC)   6.  Cigarette smoker    PLAN:    In order of problems listed above:  Dyspnea exertion, Stable.  She does have some evidence of infection today.  Yellowish sputum, also headache indicating potential sinusitis, will give her a Z-Pak to see if it helps.  Also asked her to follow-up with primary care physician if that maneuver does not help. COPD, present, contributing. Coronary disease stable denies of any chest pain tightness squeezing pressure mid chest. Dyslipidemia I did review [facility] which show me data from 10/09/2023 with LDL 40 HDL 72 we will continue present management. Cigarette smoking and a huge problem spent at least 10 minutes 1 more time talking about this I strongly recommend to quit   Medication Adjustments/Labs and Tests Ordered: Current medicines are reviewed at length with the patient today.  Concerns regarding medicines are outlined above.  Orders Placed This Encounter  Procedures   EKG 12-Lead   Medication changes: No orders of the defined types were placed in this encounter.   Signed, [place] DOROTHA Bernie, MD, [facility] 04/25/2024 2:37 PM    [facility]    [1]  Current Meds  Medication Sig   ALPRAZolam  (XANAX ) 0.5 MG tablet Take 0.5 mg by mouth at bedtime.    aspirin  EC 81 MG tablet Take 1 tablet (81 mg total) by mouth daily. Swallow whole.   atorvastatin (LIPITOR) 80 MG tablet Take 80 mg by mouth daily.   Continuous Glucose Sensor (DEXCOM G6 SENSOR) MISC Apply topically.   Continuous Glucose Transmitter (DEXCOM G6 TRANSMITTER) MISC as directed.   cyanocobalamin (,  VITAMIN B-12,) 1000 MCG/ML injection Inject 1,000 mcg into the muscle every 14 (fourteen) days.   Evolocumab  (REPATHA  SURECLICK) 140 MG/ML SOAJ INJECT 1 ML (140 MG TOTAL) UNDER THE SKIN ONCE EVERY 14 DAYS   finasteride (PROSCAR) 5 MG tablet Take 2.5 mg by mouth every evening.   FLUoxetine  (PROZAC ) 40 MG capsule Take 80 mg by mouth every evening.   folic acid  (FOLVITE ) 1 MG tablet Take 1  mg by mouth daily.   furosemide  (LASIX ) 40 MG tablet Take 40 mg by mouth daily.   insulin  aspart (NOVOLOG  FLEXPEN) 100 UNIT/ML FlexPen Inject 0-10 Units into the skin 3 (three) times daily with meals. Per sliding scale   insulin  degludec (TRESIBA  FLEXTOUCH) 200 UNIT/ML FlexTouch Pen Inject 32 Units into the skin in the morning.   Insulin  Pen Needle 31G X 5 MM MISC Use to inject Forteo  once daily   ipratropium-albuterol  (DUONEB) 0.5-2.5 (3) MG/3ML SOLN inhale one vial via nebulizer every six hours as needed FOR wheezing (Patient taking differently: PRN)   linaclotide  (LINZESS ) 290 MCG CAPS capsule Take 290 mcg by mouth at bedtime.    metoprolol  succinate (TOPROL -XL) 25 MG 24 hr tablet Take 1 tablet (25 mg total) by mouth daily.   minoxidil (LONITEN) 2.5 MG tablet Take 2.5 mg by mouth.   nitroGLYCERIN  (NITROSTAT ) 0.4 MG SL tablet Place 1 tablet (0.4 mg total) under the tongue every 5 (five) minutes as needed for chest pain.   pantoprazole  (PROTONIX ) 40 MG tablet Take 40 mg by mouth at bedtime.   "

## 2024-04-25 NOTE — Patient Instructions (Signed)
 Medication Instructions:   Z-Pak - to use as directed   Lab Work: None Ordered If you have labs (blood work) drawn today and your tests are completely normal, you will receive your results only by: MyChart Message (if you have MyChart) OR A paper copy in the mail If you have any lab test that is abnormal or we need to change your treatment, we will call you to review the results.   Testing/Procedures: None Ordered   Follow-Up: At [facility], you and your health needs are our priority.  As part of our continuing mission to provide you with exceptional heart care, we have created designated Provider Care Teams.  These Care Teams include your primary Cardiologist (physician) and Advanced Practice Providers (APPs -  Physician Assistants and Nurse Practitioners) who all work together to provide you with the care you need, when you need it.  We recommend signing up for the patient portal called MyChart.  Sign up information is provided on this After Visit Summary.  MyChart is used to connect with patients for Virtual Visits (Telemedicine).  Patients are able to view lab/test results, encounter notes, upcoming appointments, etc.  Non-urgent messages can be sent to your provider as well.   To learn more about what you can do with MyChart, go to [email or web address].    Your next appointment:   3 month(s)  The format for your next appointment:   In Person  Provider:   Lamar Fitch, MD    Other Instructions NA

## 2024-05-12 ENCOUNTER — Telehealth: Payer: Self-pay | Admitting: Cardiology

## 2024-05-12 NOTE — Telephone Encounter (Signed)
 Pt called complaining of left inner arm pain that she describes as a little stabbing pain that occurs off and on. She stated that she had a little neck pain last night as well. No chest pain, No shortness of breath. Her Crist Dark is showing normal sinus rhythm. She stated that she tried a nitroglycerin  this AM and it did not make a difference in the pain. Advised to go to the ED if her symptoms worsens or persists. Encouraged her to see her PCP if needed. Pt agreed and had no further questions.

## 2024-05-12 NOTE — Telephone Encounter (Signed)
 Patient has been experiencing upper left inner arm pain and left sided neck pain since 04/21/24. Please advise.

## 2024-05-27 ENCOUNTER — Telehealth: Payer: Self-pay | Admitting: *Deleted

## 2024-05-27 NOTE — Telephone Encounter (Signed)
"  ° °  Pre-operative Risk Assessment    Patient Name: [name]  DOB: [birth year] MRN: [id number]      Request for Surgical Clearance    Procedure:  Right Sacro Illiac Fusion  Date of Surgery:  Clearance TBD                                 Surgeon:  Dr. Donaciano Sprang Surgeon's Group or Practice Name:  [facility] Phone number:  [phone] Katheryn Dustman, scheduler Fax number:  [phone]   Type of Clearance Requested:   - Medical  - Pharmacy:  Hold Aspirin  Please advise   Type of Anesthesia:  General    Additional requests/questions:    SignedArloa Donovan Dines   05/27/2024, 4:53 PM   "

## 2024-05-30 NOTE — Telephone Encounter (Signed)
" ° °  Name: [name]  DOB: [birth year]  MRN: [id number]  Primary Cardiologist: Jennifer JONELLE Crape, MD  Chart reviewed as part of pre-operative protocol coverage. Because of Duyen A Mccarley's past medical history and time since last visit, she will require a follow-up in-office visit in order to better assess preoperative cardiovascular risk.  Patient has an office visit scheduled on 07/21/2024 with Dr. Krasowski. Per Dr. Krasowski She is to be seen again, I will schedule her to have appointment with me, while last time I seen her she was sick with sinusitis so it was difficult to evaluate. Appointment notes have been updated to reflect need for pre-op evaluation.   Pre-op covering staff:  - Please contact requesting surgeon's office via preferred method (i.e, phone, fax) to inform them of need for appointment prior to surgery.  Barnie Hila, NP  05/30/2024, 10:19 AM    "

## 2024-05-30 NOTE — Telephone Encounter (Signed)
 Dr. Bernie,  You saw this patient on 04/25/2024. Per office protocol, will you please comment on medical clearance for right sacroiliac fusion not yet scheduled?  Please route your response to P CV DIV Preop. I will communicate with requesting office once you have given recommendations.   Thank you!  Barnie Hila, NP

## 2024-07-01 ENCOUNTER — Ambulatory Visit: Admitting: Cardiology

## 2024-07-21 ENCOUNTER — Ambulatory Visit: Admitting: Cardiology
